# Patient Record
Sex: Female | Born: 1955
Health system: Southern US, Community
[De-identification: ages and names within clinical notes are randomized; demographics above are authoritative.]

## PROBLEM LIST (undated history)

## (undated) DIAGNOSIS — E282 Polycystic ovarian syndrome: Secondary | ICD-10-CM

## (undated) DIAGNOSIS — R51 Headache: Secondary | ICD-10-CM

## (undated) DIAGNOSIS — M1990 Unspecified osteoarthritis, unspecified site: Secondary | ICD-10-CM

## (undated) DIAGNOSIS — E785 Hyperlipidemia, unspecified: Secondary | ICD-10-CM

## (undated) DIAGNOSIS — M069 Rheumatoid arthritis, unspecified: Secondary | ICD-10-CM

## (undated) DIAGNOSIS — R519 Headache, unspecified: Secondary | ICD-10-CM

## (undated) DIAGNOSIS — M797 Fibromyalgia: Secondary | ICD-10-CM

## (undated) DIAGNOSIS — G4733 Obstructive sleep apnea (adult) (pediatric): Secondary | ICD-10-CM

## (undated) DIAGNOSIS — K219 Gastro-esophageal reflux disease without esophagitis: Secondary | ICD-10-CM

## (undated) DIAGNOSIS — J449 Chronic obstructive pulmonary disease, unspecified: Secondary | ICD-10-CM

## (undated) DIAGNOSIS — E119 Type 2 diabetes mellitus without complications: Secondary | ICD-10-CM

## (undated) DIAGNOSIS — I7300 Raynaud's syndrome without gangrene: Secondary | ICD-10-CM

## (undated) DIAGNOSIS — R42 Dizziness and giddiness: Secondary | ICD-10-CM

## (undated) HISTORY — PX: COLONOSCOPY W/ BIOPSIES: SHX1374

## (undated) HISTORY — PX: TOE SURGERY: SHX1073

## (undated) HISTORY — DX: Gastro-esophageal reflux disease without esophagitis: K21.9

## (undated) HISTORY — DX: Headache, unspecified: R51.9

## (undated) HISTORY — DX: Hyperlipidemia, unspecified: E78.5

## (undated) HISTORY — DX: Type 2 diabetes mellitus without complications: E11.9

## (undated) HISTORY — DX: Fibromyalgia: M79.7

## (undated) HISTORY — DX: Headache: R51

## (undated) HISTORY — DX: Polycystic ovarian syndrome: E28.2

## (undated) HISTORY — PX: VESICOVAGINAL FISTULA CLOSURE W/ TAH: SUR271

## (undated) HISTORY — DX: Chronic obstructive pulmonary disease, unspecified: J44.9

## (undated) HISTORY — DX: Dizziness and giddiness: R42

## (undated) HISTORY — DX: Obstructive sleep apnea (adult) (pediatric): G47.33

## (undated) HISTORY — PX: APPENDECTOMY: SHX54

## (undated) HISTORY — PX: CHOLECYSTECTOMY: SHX55

## (undated) HISTORY — DX: Unspecified osteoarthritis, unspecified site: M19.90

## (undated) HISTORY — DX: Rheumatoid arthritis, unspecified: M06.9

---

## 1994-02-05 HISTORY — PX: KNEE SURGERY: SHX244

## 1997-06-23 ENCOUNTER — Encounter: Admission: RE | Admit: 1997-06-23 | Discharge: 1997-09-21 | Payer: Self-pay | Admitting: Orthopedic Surgery

## 1997-08-10 ENCOUNTER — Ambulatory Visit (HOSPITAL_COMMUNITY): Admission: RE | Admit: 1997-08-10 | Discharge: 1997-08-10 | Payer: Self-pay | Admitting: Orthopedic Surgery

## 1997-09-27 ENCOUNTER — Ambulatory Visit (HOSPITAL_BASED_OUTPATIENT_CLINIC_OR_DEPARTMENT_OTHER): Admission: RE | Admit: 1997-09-27 | Discharge: 1997-09-27 | Payer: Self-pay | Admitting: Orthopedic Surgery

## 1997-12-13 ENCOUNTER — Encounter: Admission: RE | Admit: 1997-12-13 | Discharge: 1998-01-18 | Payer: Self-pay | Admitting: Orthopedic Surgery

## 1998-02-01 ENCOUNTER — Ambulatory Visit (HOSPITAL_COMMUNITY): Admission: RE | Admit: 1998-02-01 | Discharge: 1998-02-01 | Payer: Self-pay | Admitting: *Deleted

## 1998-04-05 ENCOUNTER — Ambulatory Visit (HOSPITAL_BASED_OUTPATIENT_CLINIC_OR_DEPARTMENT_OTHER): Admission: RE | Admit: 1998-04-05 | Discharge: 1998-04-05 | Payer: Self-pay | Admitting: Orthopedic Surgery

## 2000-10-18 ENCOUNTER — Other Ambulatory Visit: Admission: RE | Admit: 2000-10-18 | Discharge: 2000-10-18 | Payer: Self-pay | Admitting: Obstetrics and Gynecology

## 2001-02-06 ENCOUNTER — Ambulatory Visit (HOSPITAL_COMMUNITY): Admission: RE | Admit: 2001-02-06 | Discharge: 2001-02-06 | Payer: Self-pay | Admitting: Family Medicine

## 2001-02-06 ENCOUNTER — Encounter: Payer: Self-pay | Admitting: Family Medicine

## 2001-03-05 ENCOUNTER — Ambulatory Visit (HOSPITAL_COMMUNITY): Admission: RE | Admit: 2001-03-05 | Discharge: 2001-03-05 | Payer: Self-pay | Admitting: Internal Medicine

## 2001-03-05 ENCOUNTER — Encounter: Payer: Self-pay | Admitting: Internal Medicine

## 2001-03-24 ENCOUNTER — Ambulatory Visit (HOSPITAL_COMMUNITY): Admission: RE | Admit: 2001-03-24 | Discharge: 2001-03-24 | Payer: Self-pay | Admitting: Internal Medicine

## 2001-11-24 ENCOUNTER — Other Ambulatory Visit: Admission: RE | Admit: 2001-11-24 | Discharge: 2001-11-24 | Payer: Self-pay | Admitting: Obstetrics and Gynecology

## 2002-10-07 HISTORY — PX: COMBINED HYSTERECTOMY VAGINAL / OOPHORECTOMY / A&P REPAIR: SUR294

## 2002-10-19 ENCOUNTER — Inpatient Hospital Stay (HOSPITAL_COMMUNITY): Admission: RE | Admit: 2002-10-19 | Discharge: 2002-10-21 | Payer: Self-pay | Admitting: Obstetrics and Gynecology

## 2002-10-19 ENCOUNTER — Encounter (INDEPENDENT_AMBULATORY_CARE_PROVIDER_SITE_OTHER): Payer: Self-pay

## 2004-05-31 ENCOUNTER — Other Ambulatory Visit: Admission: RE | Admit: 2004-05-31 | Discharge: 2004-05-31 | Payer: Self-pay | Admitting: Obstetrics and Gynecology

## 2006-01-18 ENCOUNTER — Encounter (INDEPENDENT_AMBULATORY_CARE_PROVIDER_SITE_OTHER): Payer: Self-pay | Admitting: Specialist

## 2006-01-18 ENCOUNTER — Ambulatory Visit (HOSPITAL_COMMUNITY): Admission: RE | Admit: 2006-01-18 | Discharge: 2006-01-18 | Payer: Self-pay | Admitting: Surgery

## 2006-10-01 ENCOUNTER — Encounter: Admission: RE | Admit: 2006-10-01 | Discharge: 2006-10-01 | Payer: Self-pay | Admitting: Obstetrics & Gynecology

## 2007-09-12 ENCOUNTER — Ambulatory Visit (HOSPITAL_COMMUNITY): Admission: RE | Admit: 2007-09-12 | Discharge: 2007-09-12 | Payer: Self-pay | Admitting: Family Medicine

## 2008-10-17 ENCOUNTER — Emergency Department (HOSPITAL_COMMUNITY): Admission: EM | Admit: 2008-10-17 | Discharge: 2008-10-17 | Payer: Self-pay | Admitting: Emergency Medicine

## 2009-01-15 ENCOUNTER — Ambulatory Visit: Admission: RE | Admit: 2009-01-15 | Discharge: 2009-01-15 | Payer: Self-pay | Admitting: Cardiovascular Disease

## 2010-03-07 ENCOUNTER — Encounter
Admission: RE | Admit: 2010-03-07 | Discharge: 2010-03-07 | Payer: Self-pay | Source: Home / Self Care | Attending: Otolaryngology | Admitting: Otolaryngology

## 2010-03-09 ENCOUNTER — Emergency Department (HOSPITAL_COMMUNITY)
Admission: EM | Admit: 2010-03-09 | Discharge: 2010-03-09 | Disposition: A | Discharge status: Home / Self Care | Payer: BC Managed Care – PPO | Attending: Emergency Medicine | Admitting: Emergency Medicine

## 2010-03-09 DIAGNOSIS — F329 Major depressive disorder, single episode, unspecified: Secondary | ICD-10-CM | POA: Insufficient documentation

## 2010-03-09 DIAGNOSIS — F3289 Other specified depressive episodes: Secondary | ICD-10-CM | POA: Insufficient documentation

## 2010-03-09 DIAGNOSIS — J4489 Other specified chronic obstructive pulmonary disease: Secondary | ICD-10-CM | POA: Insufficient documentation

## 2010-03-09 DIAGNOSIS — J449 Chronic obstructive pulmonary disease, unspecified: Secondary | ICD-10-CM | POA: Insufficient documentation

## 2010-03-09 DIAGNOSIS — M25559 Pain in unspecified hip: Secondary | ICD-10-CM | POA: Insufficient documentation

## 2010-03-09 DIAGNOSIS — Z79899 Other long term (current) drug therapy: Secondary | ICD-10-CM | POA: Insufficient documentation

## 2010-03-09 DIAGNOSIS — K219 Gastro-esophageal reflux disease without esophagitis: Secondary | ICD-10-CM | POA: Insufficient documentation

## 2010-03-10 ENCOUNTER — Ambulatory Visit (HOSPITAL_COMMUNITY)
Admission: RE | Admit: 2010-03-10 | Discharge: 2010-03-10 | Disposition: A | Discharge status: Home / Self Care | Payer: BC Managed Care – PPO | Source: Ambulatory Visit | Attending: Family Medicine | Admitting: Family Medicine

## 2010-03-10 ENCOUNTER — Encounter (HOSPITAL_COMMUNITY): Payer: Self-pay

## 2010-03-10 ENCOUNTER — Other Ambulatory Visit (HOSPITAL_COMMUNITY): Payer: Self-pay | Admitting: Family Medicine

## 2010-03-10 DIAGNOSIS — S0990XA Unspecified injury of head, initial encounter: Secondary | ICD-10-CM | POA: Insufficient documentation

## 2010-03-10 DIAGNOSIS — R51 Headache: Secondary | ICD-10-CM

## 2010-03-10 DIAGNOSIS — X58XXXA Exposure to other specified factors, initial encounter: Secondary | ICD-10-CM | POA: Insufficient documentation

## 2010-03-10 DIAGNOSIS — R42 Dizziness and giddiness: Secondary | ICD-10-CM | POA: Insufficient documentation

## 2010-06-23 NOTE — Op Note (Signed)
[facility]  Patient:    Stephanie Norris, Stephanie Norris Visit Number: [id number] MRN: [id number]          Service Type: END Location: DAY Attending Physician:  Jonathon Bellows Dictated by:   Roetta Sessions, M.D. Proc. Date: 03/24/01 Admit Date:  03/24/2001   CC:         Stephanie Norris, M.D.   Operative Report  PROCEDURE:  Colonoscopy with snare polypectomy followed by diagnostic esophagogastroduodenoscopy.  INDICATIONS FOR PROCEDURE:  The patient is a 55 year old lady with hemoccult-positive stool and intermittent epigastric right upper quadrant abdominal pain.  She does have typical reflux symptoms that were responsive to a brief course of Nexium.  Recent CT scan demonstrated hyperdense liver with fatty infiltration.  No other abnormalities aside from diverticulosis without evidence of diverticulitis, status post appendectomy and lobulated uterus. She has positive family history of colorectal neoplasia.  Colonoscopy and upper endoscopy now being done to further evaluate her symptoms.  This approach has been discussed with Stephanie Norris at some length.  The potential risks, benefits and alternatives have been reviewed and all questions answered.  She is agreeable.  Please see my dictated consultation note for more information.  DESCRIPTION OF PROCEDURE:  Oxygen saturation, blood pressure, pulse and respirations were monitored throughout both procedures.  Conscious sedation for both procedures:  Versed 5 mg IV, Demerol 25 mg IV in divided doses. Instrument:  Olympus videochip gastroscope and colonoscope.  FINDINGS:  Digital rectal examination revealed no abnormalities.  ENDOSCOPIC FINDINGS:  Colonoscopy prep was good.  RECTUM:  Examination of the rectal mucosa including retroflexed view  of the anal verge revealed no abnormalities.  COLON:  In the distal sigmoid at 25 cm was an angry 0.75 cm polyp on a stalk. Please see photos.  There were also scattered  sigmoid diverticula.  The remainder of the colonic mucosa all the way to the cecum appeared normal.  The appendiceal orifice, ileocecal valve and cecum were well seen and photographed.  From the level of the cecum and ileocecal valve, the scope was slowly withdrawn and all previously mentioned mucosal surfaces were again seen.  No other abnormalities were observed.  The polyp at 25 cm was resected with snare cautery and recovered.  The patient tolerated the colonoscopy well and was prepared for upper endoscopy.  UPPER ENDOSCOPY:  Cetacaine spray was used for topical oropharyngeal anesthesia.  FINDINGS:  Examination of the tubular esophagus revealed multiple distal esophageal erosions.  There was no evidence of ring, stricture or Barretts esophagus.  The EG junction was easily traversed.  STOMACH:  Gastric cavity was empty and insufflated well with air.  A thorough examination of the gastric mucosa including retroflexed view of the proximal stomach and esophagogastric junction demonstrated only a small hiatal hernia. Pylorus was patent and easily traversed.  DUODENUM:  The first and second portion of the duodenum appeared normal.  THERAPEUTIC/DIAGNOSTIC MANEUVERS PERFORMED:  None.  The patient tolerated both procedures and was reacted to endoscopy.  COLONOSCOPY FINDINGS 1. Normal rectum. 2. Angry polyp on a stalk at 25 cm resected with snare cautery. 3. Scattered left-sided diverticula. 4. The remainder of the colonic mucosa appeared normal.  UPPER ENDOSCOPY FINDINGS: 1. Distal esophageal erosions.  The remainder of the esophagus appeared    normal. 2. Small hiatal hernia.  The remainder of the stomach and duodenum through    the second portion appeared normal.  RECOMMENDATIONS: 1. We will treat Stephanie Norris aggressively for gastroesophageal reflux disease.  We will start her on Aciphex 20 mg early daily 30 minutes before    breakfast. 2. Antireflux measures/_____ . 3. No  aspirin or arthritis medication for the next 10 days. 4. Follow up on pathology for polyp removed today. 5. Diverticulosis literature has been given to Stephanie Norris.  She should    increase her fiber intake.  She should either take Metamucil,    Citrucel or Benofiber daily. 6. I suspect the polyp in the sigmoid could have at least produced at least a    hemoccult-positive stool. 7. If her upper abdominal symptoms are not markedly improved with a course of    Aciphex, would pursue gallbladder further via an ultrasound. 8. Appointment to see [place] back in the office in three to four weeks. Dictated by:   Roetta Sessions, M.D. Attending Physician:  Jonathon Bellows DD:  03/24/01 TD:  03/24/01 Job: [id number]

## 2010-06-23 NOTE — Discharge Summary (Signed)
NAME:  Stephanie Norris, Stephanie Norris                         ACCOUNT NO.:  [id number]   MEDICAL RECORD NO.:  [id number]                   PATIENT TYPE:  INP   LOCATION:  9118                                 FACILITY:  [facility]   PHYSICIAN:  Guy Sandifer. Arleta Creek, M.D.           DATE OF BIRTH:  1955-02-21   DATE OF ADMISSION:  10/19/2002  DATE OF DISCHARGE:  10/21/2002                                 DISCHARGE SUMMARY   ADMISSION DIAGNOSIS:  Pelvic prolapse.   DISCHARGE DIAGNOSES:  1. Pelvic prolapse.  2. Uterine leiomyomata.  3. Endometriosis.   PROCEDURES:  On October 19, 2002, laparoscopically assisted vaginal  hysterectomy with bilateral salpingo-oophorectomy, anterior vaginal repair  with Pelvicol graft, posterior vaginal repair with PelviSoft graft and  ablation of endometriosis.   REASON FOR ADMISSION:  The patient is a 55 year old, married, white female,  G1, P0, who was postmenopausal with increasing symptoms of pelvic prolapse.  Details have been dictated in the history and physical.  She was admitted  for surgical management.   HOSPITAL COURSE:  The patient undergoes the above procedure.  The estimated  blood loss was 125 mL.  On the evening of surgery, she has good pain relief  with no nausea or vomiting and no flatus.  She remains afebrile with stable  vital signs and clear urine output.  On the morning of the first  postoperative day, she complains of some headache and some dizziness when  she is trying to sit up.  She has no nausea or vomiting, no flatus, no chest  pain, no shortness of breath, no cough, and no leg pain and she is  tolerating liquids.  Blood pressures have been 78-90/48-60.  Her pulse  remains regular.  Urine output is somewhat concentrated.  She has a 98%  oxygen saturation on 2 L nasal cannula oxygen.  Examination reveals  bibasilar crackles of the lungs with a regular rate and rhythm of the heart.  The abdomen is soft with good bowel sounds in all four  quadrants.  She was  given an IV fluid bolus.  The white count is 13.5 and the hemoglobin is  10.7.  A repeat CBC reveals a stable hemoglobin of 10.6.  The EKG is normal  sinus rhythm.  She is given additional fluid bolus of 250 mL.  A  consultation with anesthesia is also carried out.  It is felt to be  consistent with a probable prolonged reaction to the Dilaudid that she  received in the PACU, as well as her Dilaudid PCA.  The Dilaudid PCA is  discontinued.  She begins to feel better and by that evening is feeling much  better and is ambulating well.  She is passing flatus, tolerating a regular  diet, and voiding well at that time.  The Foley catheter had been removed  earlier in the day when she began to feel better.  Blood pressures were  90s/60s,  again with her regular pulse.  On the day of discharge, she  continues to feel well without problems.  She is discharged home in good  condition.  Pathology is pending.   DISCHARGE MEDICATIONS:  1. Percocet 5/325 mg, #30, one to two p.o. q.6h. p.r.n.  2. Multivitamins daily.  3. Colace daily.  4. She resume the Nexium that she was taking preoperatively.    DISCHARGE INSTRUCTIONS:  The patient is to call for any problems, including,  but not limited to heavy vaginal bleeding, persistent nausea and vomiting,  and increasing pain or temperature of 101 degrees.  No vaginal entry.  No  operation of automobiles.  No lifting as instructed.  Followup is in the  office in two weeks.                                               Guy Sandifer Arleta Creek, M.D.    JET/MEDQ  D:  10/21/2002  T:  10/21/2002  Job:  [id number]

## 2010-06-23 NOTE — Op Note (Signed)
NAME:  RHYLIN, VENTERS               ACCOUNT NO.:  [id number]   MEDICAL RECORD NO.:  [id number]          PATIENT TYPE:  AMB   LOCATION:  DAY                          FACILITY:  [facility]   PHYSICIAN:  Sandria Bales. Ezzard Standing, M.D.  DATE OF BIRTH:  1955/04/05   DATE OF PROCEDURE:  01/18/2006  DATE OF DISCHARGE:                               OPERATIVE REPORT   PREOPERATIVE DIAGNOSIS:  Biliary dyskinesia.   POSTOPERATIVE DIAGNOSIS:  Biliary dyskinesia.   OPERATION/PROCEDURE:  Laparoscopic cholecystectomy with intraoperative  cholangiogram.   SURGEON:  Sandria Bales. Ezzard Standing, M.D.   FIRST ASSISTANT:  Leonie Man, M.D.   ANESTHESIA:  General endotracheal anesthesia.   ESTIMATED BLOOD LOSS:  Minimal.   INDICATIONS:  Mrs. Bastyr is a 55 year old white female who is a patient  of Zollie Pee, PA-C at [facility] in [facility] who has had  symptoms of epigastric and right upper quadrant pain for some two years,  particularly when eating certain foods.  She had a negative ultrasound  and hepatic liver scan which showed a 19% ejection fraction consistent  with biliary dyskinesia.   The indications and potential complications of the surgery were  explained to the patient to include complications but are not limited to  bleeding, infection, bile duct injury, and the possibility that surgery  may not resolve her abdominal symptoms.   DESCRIPTION OF PROCEDURE:  The patient was placed in the supine  position, given a general endotracheal anesthetic.  She had PAS  stockings in place.  She is allergic to Ancef.  I did not giver her any  preoperative antibiotics.  Her abdomen was prepped with Betadine  solution and sterilely draped.   She had four abdominal trocars placed. An 11/12 mm Hasson trocar at the  umbilicus secured with a 0 Vicryl suture.  A 10 mm subxiphoid trocar, a  5 mm right mid subcostal trocar and a 5 mm lateral subcostal trocar.  Abdominal exploration revealed the right and left  lobes of the liver  showing some fatty changes but no mass or lesion.  The bowel was covered  very much with omentum. There was no other nodule or mass.   The gallbladder was identified, rotated cephalad. There were some  adhesions on the anterior wall of the gallbladder which were taken down  sharply and bluntly.  Dissection was carried out at the  gallbladder/cystic duct junction and a clip placed across an anterior  cystic artery.  This was then divided.  The clip then placed on the  gallbladder side of the cystic duct and intraoperative cholangiogram was  obtained.   The intraoperative cholangiogram was obtained using a cut-off Taut  catheter inserted through a 14-gauge Jelco catheter and secured with an  endo-clip.  I used about 8 mL of half-strength Hypaque solution and  injected this under fluoroscopy.  The cholangiogram showed free flow of  contrast down the cystic duct, down the common bile duct, into the  duodenum, and up the hepatic radicals.  This was felt to be a normal  intraoperative cholangiogram. The Taut catheter was then removed.  The cystic duct was triply endoclipped and divided.  There was a  posterior branch of the cystic artery which had been clipped and  divided.  The gallbladder was then sharply and bluntly dissected from  the gallbladder bed.  Prior to complete division of the gallbladder bed,  I revisualized the triangle of Calot.  I revisualized the gallbladder  bed.  There was no bile leak, no bleeding from either of these.  The  gallbladder was then divided, placed in the EndoCatch bag and delivered  through the umbilicus.   I then inspected the liver and gallbladder bed and removed the trocar in  turn.  The umbilical port was closed with 0 Vicryl suture.  The skin at  each port was closed with a 5-0 Vicryl suture and  painted with tincture  of Benzoin and steri-stripped.   The patient tolerated the procedure well and was transferred to the  recovery  room in good condition.  Sponge and needle counts were correct.      Sandria Bales. Ezzard Standing, M.D.  Electronically Signed     DHN/MEDQ  D:  01/18/2006  T:  01/18/2006  Job:  [id number]   cc:   Zollie Pee, PA-C  [facility]  [address]  [place] [address]

## 2010-06-23 NOTE — H&P (Signed)
NAME:  Stephanie Norris, Stephanie Norris                           ACCOUNT NO.:  [id number]   MEDICAL RECORD NO.:  [id number]                   PATIENT TYPE:   LOCATION:                                       FACILITY:   PHYSICIAN:  Guy Sandifer. Arleta Creek, M.D.           DATE OF BIRTH:   DATE OF ADMISSION:  10/19/2002  DATE OF DISCHARGE:                                HISTORY & PHYSICAL   CHIEF COMPLAINT:  Symptomatic pelvic prolapse.   HISTORY OF PRESENT ILLNESS:  This patient is a 55 year old, married, white  female, G1, P0, who is postmenopausal with a last menstrual period in June  of 2003.  She has increasingly symptomatic pelvic prolapse with difficulty  sometimes emptying her rectum, having to digitally splint for bowel  movements.  She also has Valsalva-induced detrusor instability.  Urodynamic  testing on September 01, 2002, with Dr. Edward Jolly documents detrusor instability and  a lack of stress urinary incontinence.  She does have some loss of urine  with coughing and sneezing only when her bladder is full.  Placement of an  incontinence ring gave little relief to any of these symptoms.  After a  careful discussion of the options of management, she is being admitted for a  laparoscopically assisted vaginal hysterectomy to assure bilateral salpingo-  oophorectomy with anterior posterior repair with Pelvicol grafts.  Possible  sacrospinous ligament suspension has also been reviewed.   PAST MEDICAL HISTORY:  1. Asthma-like symptoms in the past.  2. History of rectal fissures and hemorrhoids.  3. History of polycystic ovarian disease.  4. History of anxiety disorder.  5. Detrusor instability.  6. History of hirsutism.   PAST SURGICAL HISTORY:  1. Appendectomy at age 63.  2. Left knee surgery in 1992.  3. D&C in 1983.  4. Laparoscopy at age 47.   OBSTETRICAL HISTORY:  Miscarriage with D&C in 1983.   FAMILY HISTORY:  Diabetes in paternal grandmother and maternal grandparents.  Chronic hypertension  in paternal grandmother.  Tuberculosis in mother and  sister.  Asthma in mother.  Thyroid abnormality in mother.   MEDICATIONS:  1. Nexium daily.  2. Paxil daily.  3. Multivitamins and stool softeners daily.   ALLERGIES:  1. IBUPROFEN leading to her throat closing off (aspirin is okay if coated).  2. KEFLEX leading to rash.  Other antibiotics have been okay in the past.   SOCIAL HISTORY:  The patient denies tobacco, alcohol, or drug abuse.   REVIEW OF SYSTEMS:  NEUROLOGIC:  Denies headache.  PULMONARY:  History of  asthma-like symptoms in the past.  No shortness of breath or cough recently.  CARDIOVASCULAR:  Denies chest pain.  GASTROINTESTINAL:  Esophageal reflux  disease.   PHYSICAL EXAMINATION:  HEIGHT:  5 feet 0 inches.  WEIGHT:  143 pounds.  VITAL SIGNS:  Blood pressure 116/74.  HEENT:  Without thyromegaly.  LUNGS:  Clear to auscultation.  HEART:  Regular rate  and rhythm.  BACK:  Without CVA tenderness.  BREASTS:  Without mass, retraction, or discharge.  ABDOMEN:  Soft and nontender without masses.  PELVIC:  Vulva, vagina, and cervix without lesion.  The uterus is normal  size and mobile with first degree descent.  There is a cystocele, first and  second degree that remains within the hymenal ring.  Rectocele, grade 2,  descends down to the hymenal ring.  Adnexa nontender without masses.  EXTREMITIES:  Grossly within normal limits.  NEUROLOGIC:  Grossly within normal limits.   ASSESSMENT:  Symptomatic pelvic relaxation.   PLAN:  1. Laparoscopically assisted vaginal hysterectomy with bilateral salpingo-     oophorectomy.  2. Anterior posterior vaginal repair.  3. Possible sacrospinous ligament suspension.                                               Guy Sandifer Arleta Creek, M.D.    JET/MEDQ  D:  10/14/2002  T:  10/14/2002  Job:  [id number]

## 2010-06-23 NOTE — Op Note (Signed)
NAME:  Stephanie Norris, Stephanie Norris                         ACCOUNT NO.:  [id number]   MEDICAL RECORD NO.:  [id number]                   PATIENT TYPE:  INP   LOCATION:  9199                                 FACILITY:  [facility]   PHYSICIAN:  Guy Sandifer. Arleta Creek, M.D.           DATE OF BIRTH:  [birth year]   DATE OF PROCEDURE:  10/19/2002  DATE OF DISCHARGE:                                 OPERATIVE REPORT   PREOPERATIVE DIAGNOSIS:  Pelvic prolapse.   POSTOPERATIVE DIAGNOSES:  1. Pelvic prolapse.  2. Uterine leiomyomata.  3. Endometriosis.   PROCEDURES:  1. Laparoscopically-assisted vaginal hysterectomy with bilateral salpingo-     oophorectomy.  2. Anterior repair with Pelvicol graft.  3. Posterior repair with PelviSoft graft.  4. Ablation of endometriosis.   SURGEON:  Guy Sandifer. Henderson Cloud, M.D.   ASSISTANT:  Raynald Kemp, M.D.   ANESTHESIA:  General with endotracheal intubation.   ESTIMATED BLOOD LOSS:  125 mL.   SPECIMENS:  Uterus, fallopian tubes, and ovaries bilaterally.   INDICATIONS AND CONSENT:  This patient is a 55 year old married white  female, G1, P0, who is postmenopausal with last menstrual period in June  2003.  She has increasing symptomatic pelvic prolapse.  Details are dictated  in the history and physical.  Laparoscopically-assisted vaginal hysterectomy  with bilateral salpingo-oophorectomy, anterior and posterior repair with  Pelvicol and PelviSoft grafts is discussed preoperatively.  Potential risks  and complications have been discussed, including but not limited to  infection, bowel, bladder, or ureteral damage, bleeding requiring  transfusion of blood products with possible transfusion reaction, HIV and  hepatitis acquisition, DVT and PE, pneumonia, fistula formation,  postoperative dyspareunia, laparotomy, and postoperative pelvic pain.  all  questions have been answered and consent is signed and on the chart.   FINDINGS:  Upper abdomen is grossly normal.  Uterus is  approximately six  weeks in size and distorted with 1-1.5 cm subserosal uterine leiomyomata.  There are two to three powder burn-type lesions of endometriosis immediately  lateral to the left uterosacral ligament and a single lesion on the right  pelvic sidewall.  Tubes and ovaries are normal.   DESCRIPTION OF PROCEDURE:  The patient was taken to the operating room and  placed in the dorsal supine position, where general anesthesia is induced  via endotracheal intubation.  She is then placed in the dorsal lithotomy  position, where she is prepped abdominally.  Bladder is straight-  catheterized with a red rubber catheter.  A Hulka tenaculum was placed on  the uterus as a manipulator, and she was draped in a sterile fashion.  A  small infraumbilical incision is made and a Veress needle is placed without  difficulty.  Syringe and drop test are normal.  Pneumoperitoneum is created  with approximately 2 L of CO2.  The Veress needle is then withdrawn and the  10/11 disposable trocar sleeve is placed without difficulty.  Placement is  verified with the laparoscope and no damage to surrounding structures is  noted.  A small suprapubic incision is made in the midline and a 5 mm  disposable trocar sleeve is placed under direct visualization without  difficulty.  The above findings are noted.  The course of the ureters is  noted bilaterally.  Bipolar cautery is used to ablate the areas of  endometriosis in a superficial fashion.  Then using the Gyrus bipolar  cautery cutting instrument, the infundibulopelvic ligaments, followed by the  mesosalpinx, followed by the round ligaments down to the level of the  vesicouterine peritoneum, is taken down bilaterally.  Good hemostasis is  maintained.  The vesicouterine peritoneum is incised in the midline,  hydrodissected, and taken down cephalolaterally.  The suprapubic trocar  sleeve is removed and attention is turned to the vagina.  A weighted  speculum is  placed.  The posterior cul-de-sac is entered sharply.  Cervix is  circumscribed with a scalpel.  The vaginal mucosa is advanced sharply and  bluntly.  The uterosacral ligaments are taken down bilaterally and are  ligated with transfixion sutures of 0 Monocryl.  All suture will be 0  Monocryl unless otherwise designated.  The bladder pillars, followed by the  cardinal ligaments, followed by the uterine vessels, are taken bilaterally.  At least one bite above the level of the uterine vessels is taken  bilaterally.  The fundus with tubes and ovaries is delivered posteriorly,  ligaments are clamped and taken down, and the specimen is delivered.  Pedicles are ligated with free ties.  The uterosacral ligaments are then  plicated to the vagina bilaterally and are then plicated in the midline with  a separate suture.  The posterior half of the cuff is closed with figure-of-  eights.  The anterior vaginal mucosa is then infiltrated with 0.5% plain  lidocaine with epinephrine submucosally bilaterally.  The anterior vaginal  mucosa is then dissected from the underlying bladder beginning in the  midline to a point approximately 1-2 cm below the level of the urethral  meatus.  This was then carried out bilaterally sharply and bluntly.  This is  carried down to the point that the ischial spines are palpated bilaterally.  Then using the Capio needle passer, a 0 Vicryl suture is placed through the  sacrospinous ligament one to two fingerbreadths medial to the ischial spines  bilaterally.  These are then anchored to the back corners of a Pelvicol  graft, which has been trimmed to fit.  These sutures are then used to anchor  and advance the Pelvicol graft.  After trimming to fit, 0 Vicryl sutures are  used in the pubocervical fascia bilaterally on the distal end to anchor it  on the two remaining corners.  This is done well clear of midline structures.  A small amount of vaginal mucosa is trimmed, and the  mucosa is  closed with running, locking 2-0 Monocryl sutures.  Figure-of-eight 0  Monocryls are used to close the remaining portion of the anterior vaginal  cuff.  Then after removing a small diamond-shaped portion of tissue from the  posterior perineal body, the posterior vaginal mucosa is infiltrated  bilaterally with 0.5% lidocaine with epinephrine.  The posterior vaginal  mucosa is dissected from the underlying bladder in the midline to a point at  the top of the rectocele.  This was carried out bilaterally sharply and  bluntly.  Obvious defects in the rectovaginal fascia are noted and site-  specific repairs are  carried out using 0 Monocryl suture.  This does a good  job of repairing the defects.  Then 0 Vicryl sutures are placed bilaterally  approximately 1 cm inferior and slightly distal to the ischial spines  bilaterally.  After trimming a piece of PelviSoft to fit, these are used to  advance the PelviSoft and lay it in place.  The PelviSoft is then anchored  with two 0 Vicryl sutures bilaterally along the sides to the levator plates  bilaterally.  After trimming slightly to fit, additional 0 Vicryl sutures  are used to attach to the perineal body.  The perineal body is  reapproximated as well with a single 0 Monocryl suture.  After trimming a  small amount of posterior vaginal mucosa, it is closed with a running  locking layer of 2-0 Monocryl and carried on down in the standard episiotomy-  type fashion.  A Foley catheter is placed in the bladder and clear urine is  noted.  Two-inch plain pack with estrogen cream is placed in the vagina.  Attention is then turned to the abdomen.  After recreating pneumoperitoneum,  a small number of bleeders are controlled on the peritoneal edges with  bipolar cautery and repeated inspection under reduced pneumoperitoneum  reveals excellent hemostasis.  Excess fluid is removed, suprapubic trocar  sleeve is removed, and good hemostasis is noted all  around.  Pneumoperitoneum is completely reduced and the umbilical incision is closed  with a 0 Vicryl suture in the deeper underlying layers with care being taken  not to pick up any underlying structures.  The skin incisions are then  injected with 0.5% plain Marcaine.  The umbilical incision is closed with  mattress sutures of 3-0 Vicryl secondary to  bleeding at the skin edges.  Dermabond is then used to close the skin on  both incisions and a pressure dressing is placed on the umbilicus.  All  counts are correct.  The patient is awakened and taken to the recovery room  in stable condition.                                               Guy Sandifer Arleta Creek, M.D.    JET/MEDQ  D:  10/19/2002  T:  10/19/2002  Job:  [id number]

## 2011-02-22 ENCOUNTER — Ambulatory Visit (HOSPITAL_COMMUNITY)
Admission: RE | Admit: 2011-02-22 | Discharge: 2011-02-22 | Disposition: A | Discharge status: Home / Self Care | Payer: BC Managed Care – PPO | Source: Ambulatory Visit | Attending: Internal Medicine | Admitting: Internal Medicine

## 2011-02-22 ENCOUNTER — Other Ambulatory Visit (HOSPITAL_COMMUNITY): Payer: Self-pay | Admitting: *Deleted

## 2011-02-22 DIAGNOSIS — R05 Cough: Secondary | ICD-10-CM

## 2011-02-22 DIAGNOSIS — R059 Cough, unspecified: Secondary | ICD-10-CM | POA: Insufficient documentation

## 2011-02-22 DIAGNOSIS — R0602 Shortness of breath: Secondary | ICD-10-CM | POA: Insufficient documentation

## 2011-02-28 ENCOUNTER — Other Ambulatory Visit (HOSPITAL_COMMUNITY): Payer: Self-pay | Admitting: Internal Medicine

## 2011-02-28 DIAGNOSIS — K5792 Diverticulitis of intestine, part unspecified, without perforation or abscess without bleeding: Secondary | ICD-10-CM

## 2011-03-02 ENCOUNTER — Ambulatory Visit (HOSPITAL_COMMUNITY)
Admission: RE | Admit: 2011-03-02 | Discharge: 2011-03-02 | Disposition: A | Discharge status: Home / Self Care | Payer: BC Managed Care – PPO | Source: Ambulatory Visit | Attending: Internal Medicine | Admitting: Internal Medicine

## 2011-03-02 DIAGNOSIS — R1031 Right lower quadrant pain: Secondary | ICD-10-CM | POA: Insufficient documentation

## 2011-03-02 DIAGNOSIS — K5792 Diverticulitis of intestine, part unspecified, without perforation or abscess without bleeding: Secondary | ICD-10-CM

## 2011-03-02 DIAGNOSIS — K7689 Other specified diseases of liver: Secondary | ICD-10-CM | POA: Insufficient documentation

## 2011-03-28 ENCOUNTER — Ambulatory Visit (INDEPENDENT_AMBULATORY_CARE_PROVIDER_SITE_OTHER): Payer: BC Managed Care – PPO | Admitting: Internal Medicine

## 2011-03-28 ENCOUNTER — Encounter: Payer: Self-pay | Admitting: Internal Medicine

## 2011-03-28 VITALS — BP 122/78 | HR 88 | Temp 98.5°F | Ht 59.75 in | Wt 154.6 lb

## 2011-03-28 DIAGNOSIS — R0602 Shortness of breath: Secondary | ICD-10-CM

## 2011-03-28 DIAGNOSIS — R05 Cough: Secondary | ICD-10-CM

## 2011-03-28 MED ORDER — MOMETASONE FURO-FORMOTEROL FUM 200-5 MCG/ACT IN AERO: INHALATION_SPRAY | RESPIRATORY_TRACT | Status: DC

## 2011-03-28 NOTE — Patient Instructions (Addendum)
dulera 200 Take 2 puffs first thing in am and then another 2 puffs about 12 hours later.    Work on inhaler technique:  relax and gently blow all the way out then take a nice smooth deep breath back in, triggering the inhaler at same time you start breathing in.  Hold for up to 5 seconds if you can.  Rinse and gargle with water when done   If your mouth or throat starts to bother you,   I suggest you time the inhaler to your dental care and after using the inhaler(s) brush teeth and tongue with a baking soda containing toothpaste and when you rinse this out, gargle with it first to see if this helps your mouth and throat.     GERD (REFLUX)  is an extremely common cause of respiratory symptoms, many times with no significant heartburn at all.    It can be treated with medication, but also with lifestyle changes including avoidance of late meals, excessive alcohol, smoking cessation, and avoid fatty foods, chocolate, peppermint, colas, red wine, and acidic juices such as orange juice.  NO MINT OR MENTHOL PRODUCTS SO NO COUGH DROPS  USE SUGARLESS CANDY INSTEAD (jolley ranchers or Stover's)  NO OIL BASED VITAMINS - use powdered substitutes.   If not improving start zegrid in am also  Please schedule a follow up office visit in 6 weeks, call sooner if needed for pft's

## 2011-03-28 NOTE — Progress Notes (Signed)
  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   03/28/2011 1st pulmonary eval cc chest tightness present daily x one year waxes and wanes,better in shower better p inhaler dulera but then worse off it. ? Sleep "I fight my cpap". Cough mostly dry and more day than night  Doe variable but sometime just room to room  Denies any obvious fluctuation of symptoms with weather or environmental changes or other aggravating or alleviating factors except as outlined above   Review of Systems  Constitutional: Negative for fever, chills and unexpected weight change.  HENT: Positive for congestion and trouble swallowing. Negative for ear pain, nosebleeds, sore throat, rhinorrhea, sneezing, dental problem, voice change, postnasal drip and sinus pressure.   Eyes: Negative for visual disturbance.  Respiratory: Positive for cough and shortness of breath. Negative for choking.   Cardiovascular: Positive for chest pain. Negative for leg swelling.  Gastrointestinal: Negative for vomiting, abdominal pain and diarrhea.  Genitourinary: Negative for difficulty urinating.  Musculoskeletal: Positive for arthralgias.  Skin: Negative for rash.  Neurological: Positive for headaches. Negative for tremors and syncope.  Hematological: Does not bruise/bleed easily.       Objective:   Physical Exam  Pleasant amb min hoarse wf nad  Wt 154 03/28/11  HEENT: nl dentition, turbinates, and orophanx. Nl  external ear canals without cough reflex   NECK :  without JVD/Nodes/TM/ nl carotid upstrokes bilaterally   LUNGS: no acc muscle use, clear to A and P bilaterally without cough on insp or exp maneuvers   CV:  RRR  no s3 or murmur or increase in P2, no edema   ABD:  soft and nontender with nl excursion in the supine position. No bruits or organomegaly, bowel sounds nl  MS:  warm without deformities, calf tenderness, cyanosis or clubbing  SKIN: warm and dry without lesions    NEURO:  alert, approp, no deficits   02/22/11 cxr No acute abnormalities.      Assessment & Plan:

## 2011-03-30 DIAGNOSIS — J4540 Moderate persistent asthma, uncomplicated: Secondary | ICD-10-CM | POA: Insufficient documentation

## 2011-03-30 NOTE — Assessment & Plan Note (Signed)
Symptoms are markedly disproportionate to objective findings and not clear this is a lung problem but pt does appear to have difficult airway management issues. DDX of  difficult airways managment all start with A and  include Adherence, Ace Inhibitors, Acid Reflux, Active Sinus Disease, Alpha 1 Antitripsin deficiency, Anxiety masquerading as Airways dz,  ABPA,  allergy(esp in young), Aspiration (esp in elderly), Adverse effects of DPI,  Active smokers, plus two Bs  = Bronchiectasis and Beta blocker use..and one C= CHF   Adherence is always the initial "prime suspect" and is a multilayered concern that requires a "trust but verify" approach in every patient - starting with knowing how to use medications, especially inhalers, correctly, keeping up with refills and understanding the fundamental difference between maintenance and prns vs those medications only taken for a very short course and then stopped and not refilled. The proper method of use, as well as anticipated side effects, of this metered-dose inhaler are discussed and demonstrated to the patient. Improved from 25-75% with coaching     ? Acid reflux > next step is diet and maybe increase zegrid to bid   See instructions for specific recommendations which were reviewed directly with the patient who was given a copy with highlighter outlining the key components.

## 2011-05-08 ENCOUNTER — Telehealth: Payer: Self-pay | Admitting: Internal Medicine

## 2011-05-08 NOTE — Telephone Encounter (Signed)
Fine with me - needs pft's as rec regardless of what doctor does the follow up or she could see [facility]

## 2011-05-08 NOTE — Telephone Encounter (Signed)
Dr. Shelle Iron, pls advise if you are ok with this.  Thank you.

## 2011-05-08 NOTE — Telephone Encounter (Signed)
Dr. Sherene Sires, please advise if you are okay with pt changing providers, thanks

## 2011-05-08 NOTE — Telephone Encounter (Signed)
Pt returned call. She says she prefers either dr. Shelle Iron or dr. Maple Hudson. Stephanie Norris

## 2011-05-08 NOTE — Telephone Encounter (Signed)
lmomtcb x1--does she have a preference who she see's

## 2011-05-08 NOTE — Telephone Encounter (Signed)
Returning call can be reached at [phone].Stephanie Norris

## 2011-05-08 NOTE — Telephone Encounter (Signed)
Called # provided above - LM on pt's named VM tcb to inform her of protocol and does she have a preference on another dr?

## 2011-05-09 NOTE — Telephone Encounter (Signed)
I spoke with pt and she is going to keep her apt for PFT on 05/14/11 and is scheduled to see [facility] 05/30/11 at 1:30. Pt is aware to arrive 15 min prior to fill out paperwork. Noting further was needed

## 2011-05-09 NOTE — Telephone Encounter (Signed)
Agree to see, but will need to be in consult slot, and she still needs to have pfts before seeing me.

## 2011-05-14 ENCOUNTER — Ambulatory Visit (INDEPENDENT_AMBULATORY_CARE_PROVIDER_SITE_OTHER): Payer: BC Managed Care – PPO | Admitting: Internal Medicine

## 2011-05-14 ENCOUNTER — Ambulatory Visit: Payer: BC Managed Care – PPO | Admitting: Internal Medicine

## 2011-05-14 DIAGNOSIS — R0602 Shortness of breath: Secondary | ICD-10-CM

## 2011-05-14 LAB — PULMONARY FUNCTION TEST

## 2011-05-14 NOTE — Progress Notes (Signed)
PFT done today. 

## 2011-05-16 ENCOUNTER — Encounter: Payer: Self-pay | Admitting: Internal Medicine

## 2011-05-30 ENCOUNTER — Encounter: Payer: Self-pay | Admitting: Pulmonary Disease

## 2011-05-30 ENCOUNTER — Ambulatory Visit (INDEPENDENT_AMBULATORY_CARE_PROVIDER_SITE_OTHER): Payer: BC Managed Care – PPO | Admitting: Pulmonary Disease

## 2011-05-30 ENCOUNTER — Other Ambulatory Visit: Payer: BC Managed Care – PPO

## 2011-05-30 DIAGNOSIS — R0609 Other forms of dyspnea: Secondary | ICD-10-CM

## 2011-05-30 DIAGNOSIS — J45909 Unspecified asthma, uncomplicated: Secondary | ICD-10-CM

## 2011-05-30 NOTE — Assessment & Plan Note (Signed)
The patient has moderate airflow obstruction by her recent pulmonary function studies, and therefore either has fixed asthma or poorly controlled airway inflammation leading to airflow obstruction.  I have explained to her that Singulair alone is really inadequate treatment for moderate to severe asthmatics, and that she really needs to be on inhaled corticosteroids.  I would like to put her back on dulera, and hopefully over time her air flow obstruction will improve.  I also think she needs to have an alpha-1 antitrypsin level checked as well.

## 2011-05-30 NOTE — Progress Notes (Signed)
  Subjective:    Patient ID: Stephanie Norris, female    DOB: 1955-04-06, 56 y.o.   MRN: [id number]  HPI The patient is a 56 year old female who I've been asked to see for management of asthma and also dyspnea on exertion.  The patient was diagnosed with asthma over 10 years ago, and has been on Singulair for treatment.  She feels that her asthma has been well-controlled, and has not required a prednisone taper for quite a long time.  However, she has significant dyspnea on exertion, and recent pulmonary function studies that showed moderate airflow obstruction and air trapping.  The patient describes a one block dyspnea on exertion at a moderate pace on flat ground, and we'll also get winded bringing groceries in from the car or making a bed.  She has not had a recent cardiac workup, and her weight has been stable over the last few years.  The patient has an intermittent cough which she believes is secondary to postnasal drip, which is an ongoing issue for her.  She has a history of recurrent sinus infections, and has seen ENT in the past.  She has not had a recent scan of her sinuses.  She also has a history of allergies on testing, and took allergy shots for a period of time that did help some.  A lot of her symptoms are consistent with chronic rhinitis.  The patient also has a history of reflux disease which she feels is well controlled on Zegerid.   Review of Systems  Constitutional: Negative for fever and unexpected weight change.  HENT: Positive for ear pain and congestion. Negative for nosebleeds, sore throat, rhinorrhea, sneezing, trouble swallowing, dental problem, postnasal drip and sinus pressure.   Eyes: Negative for redness and itching.  Respiratory: Positive for shortness of breath. Negative for cough, chest tightness and wheezing.   Cardiovascular: Negative for palpitations and leg swelling.  Gastrointestinal: Negative for nausea and vomiting.  Genitourinary: Negative for dysuria.    Musculoskeletal: Negative for joint swelling.  Skin: Negative for rash.  Neurological: Negative for headaches.  Hematological: Does not bruise/bleed easily.  Psychiatric/Behavioral: Negative for dysphoric mood. The patient is not nervous/anxious.        Objective:   Physical Exam Constitutional:  Overweight female, no acute distress  HENT:  Nares patent without discharge  Oropharynx without exudate, palate and uvula are elongated.  Eyes:  Perrla, eomi, no scleral icterus  Neck:  No JVD, no TMG  Cardiovascular:  Normal rate, regular rhythm, no rubs or gallops.  No murmurs        Intact distal pulses  Pulmonary :  Normal breath sounds, no stridor or respiratory distress   No rales, rhonchi, or wheezing  Abdominal:  Soft, nondistended, bowel sounds present.  No tenderness noted.   Musculoskeletal:  Mild ankle edema noted.  Lymph Nodes:  No cervical lymphadenopathy noted  Skin:  No cyanosis noted  Neurologic:  Alert, appropriate, moves all 4 extremities without obvious deficit.         Assessment & Plan:

## 2011-05-30 NOTE — Assessment & Plan Note (Signed)
The patient's dyspnea on exertion is out of proportion to her degree of airflow obstruction.  It is unclear whether this is a conditioning and obesity issue, or whether she may have a concomitant cardiac issues as well.  I have stressed to her the importance of consistent and aggressive asthma treatment, and also she needs to work on weight loss and conditioning.  If she does not see an improvement in her exertional tolerance, I would then recommend a cardiac workup.

## 2011-05-30 NOTE — Patient Instructions (Addendum)
Stay on singulair for allergy control Will restart dulera 100/5  2 inhalations am and pm everyday no matter what.  Rinse mouth well. Try chlorpheniramine 8mg  each night for allergies and postnasal drip.  Can take another dose at lunch if needed. Think about trying neilmed sinus rinses to help with allergies and congestion.  Use am and pm when needed.  Will arrange followup visit to discuss your sleep apnea and cpap.

## 2011-06-05 LAB — ALPHA-1 ANTITRYPSIN PHENOTYPE: A-1 Antitrypsin: 121 mg/dL (ref 83–199)

## 2011-06-22 ENCOUNTER — Ambulatory Visit (INDEPENDENT_AMBULATORY_CARE_PROVIDER_SITE_OTHER): Payer: BC Managed Care – PPO | Admitting: Pulmonary Disease

## 2011-06-22 ENCOUNTER — Encounter: Payer: Self-pay | Admitting: Pulmonary Disease

## 2011-06-22 VITALS — BP 118/86 | HR 86 | Temp 98.5°F | Ht 59.75 in | Wt 150.6 lb

## 2011-06-22 DIAGNOSIS — G4710 Hypersomnia, unspecified: Secondary | ICD-10-CM

## 2011-06-22 DIAGNOSIS — G4733 Obstructive sleep apnea (adult) (pediatric): Secondary | ICD-10-CM | POA: Insufficient documentation

## 2011-06-22 NOTE — Assessment & Plan Note (Signed)
It is unclear at this point if the patient has obstructive sleep apnea or not.  She had a NPSG in 2010 that was unremarkable except for increased numbers of leg kicks.  She has been on CPAP even before then from a portable study done in the distant past.  She currently has nonrestorative sleep and significant daytime sleepiness even wearing CPAP, but is even worse if she does not wear the device.  I think we need to put the issue to rest at this point, and we'll need to schedule her for a sleep study.  The patient states that she cannot afford an in lab study, and therefore will schedule her for sleep testing.  She understands this will not evaluate for a movement disorder of sleep, and may under estimate her degree of actual sleep apnea.

## 2011-06-22 NOTE — Progress Notes (Signed)
Subjective:    Patient ID: Stephanie Norris, female    DOB: [birth year], 56 y.o.   MRN: [id number]  HPI The patient is a 57 year old female who is here for evaluation of possible obstructive sleep apnea.  The patient states that she was diagnosed with obstructive sleep apnea by portable monitoring approximately 8 years ago, and has been on CPAP since that time.  She did have a formal sleep study in 2010 and did not show any clinically significant sleep apnea.  Despite this, she stayed on CPAP because she could not sleep well without it.  Her machine is currently set on 11 cm of water pressure, and she uses a full face mask that is apparently new.  Despite this, the patient complains of nonrestorative sleep, and has definite sleep pressure during the day with inactivity.  She will also have occasional sleep pressure with driving.  The patient states her bed partner has not complained about leg kicks, but she does have an abnormal sensation in her legs at night that is improved with movement.  However, this primarily occurs in her foot rather than her whole leg, and she has chronic pain related to an old foot injury.  The patient states that her weight is up a few pounds over the last few years.  Sleep Questionnaire: What time do you typically go to bed?( Between what hours) 10:30 - 11 pm How long does it take you to fall asleep? 10 minutes How many times during the night do you wake up? 2 What time do you get out of bed to start your day? 0620 Do you drive or operate heavy machinery in your occupation? No How much has your weight changed (up or down) over the past two years? (In pounds) 2 lb (0.907 kg) Have you ever had a sleep study before? Yes If yes, location of study? [facility] If yes, date of study? Do you currently use CPAP? Yes If so, what pressure? 11cm ? Do you wear oxygen at any time? No    Review of Systems  Constitutional: Negative.  Negative for fever and unexpected weight change.  HENT:  Positive for ear pain, congestion, sneezing, trouble swallowing and dental problem. Negative for nosebleeds, sore throat, rhinorrhea, postnasal drip and sinus pressure.   Eyes: Negative.  Negative for redness and itching.  Respiratory: Positive for cough and shortness of breath. Negative for chest tightness and wheezing.   Cardiovascular: Positive for leg swelling. Negative for palpitations.  Gastrointestinal: Negative.  Negative for nausea and vomiting.  Genitourinary: Negative.  Negative for dysuria.  Musculoskeletal: Positive for arthralgias. Negative for joint swelling.  Skin: Negative.  Negative for rash.  Neurological: Negative.  Negative for headaches.  Hematological: Negative.  Does not bruise/bleed easily.  Psychiatric/Behavioral: Negative.  Negative for dysphoric mood. The patient is not nervous/anxious.        Objective:   Physical Exam Constitutional:  Overweight female, no acute distress  HENT:  Nares patent without discharge  Oropharynx without exudate, palate and uvula are mildly elongated  Eyes:  Perrla, eomi, no scleral icterus  Neck:  No JVD, no TMG  Cardiovascular:  Normal rate, regular rhythm, no rubs or gallops.  No murmurs        Intact distal pulses  Pulmonary :  Normal breath sounds, no stridor or respiratory distress   No rales, rhonchi, or wheezing  Abdominal:  Soft, nondistended, bowel sounds present.  No tenderness noted.   Musculoskeletal:  No lower extremity edema noted.  Lymph Nodes:  No cervical lymphadenopathy noted  Skin:  No cyanosis noted  Neurologic:  Appears mildly sleepy, appropriate, moves all 4 extremities without obvious deficit.         Assessment & Plan:

## 2011-06-22 NOTE — Patient Instructions (Signed)
Will schedule for home sleep testing, and will call you to discuss once results are available.  Stay on current asthma treatment for now. Work on weight reduction

## 2011-07-04 ENCOUNTER — Telehealth: Payer: Self-pay | Admitting: Pulmonary Disease

## 2011-07-04 NOTE — Telephone Encounter (Signed)
Noted  

## 2011-07-09 ENCOUNTER — Telehealth: Payer: Self-pay | Admitting: Pulmonary Disease

## 2011-07-09 NOTE — Telephone Encounter (Signed)
Spoke to Stephanie Norris and she states she was not on the methotrexate when she came to see kc, she had stopped this herself, but the med was prescribed by dr Titus Dubin so i told Stephanie Norris she would need to discuss this with the dr that originally gave this to her Stephanie Norris verbalized understanding and will call dr Titus Dubin

## 2011-07-31 ENCOUNTER — Telehealth: Payer: Self-pay | Admitting: Pulmonary Disease

## 2011-07-31 NOTE — Telephone Encounter (Signed)
LMTCB x 1 

## 2011-08-01 NOTE — Telephone Encounter (Signed)
LMTCB x2  

## 2011-08-01 NOTE — Telephone Encounter (Signed)
Spoke with pt. She states that she is unable to afford sleep study, but needs an order sent to [facility] for new cpap since her machine is so old. Please advise recs thanks!

## 2011-08-03 NOTE — Telephone Encounter (Signed)
Patient calling back about order for cpap machine.

## 2011-08-03 NOTE — Telephone Encounter (Signed)
She cannot get a new cpap machine without documentation she has sleep apnea.  The only way to do this is to do the sleep study.  If this does not show sleep apnea, then she would not be able to get the cpap machine either.

## 2011-08-06 NOTE — Telephone Encounter (Signed)
lmomtcb x1 for pt 

## 2011-08-06 NOTE — Telephone Encounter (Signed)
Pt advised. Pt states the the sleep study is going to cost her over $1000 and she cannot afford that at this time. . I asked about home sleep study and she states insurance will not cover this at all. Pt states she will call back if this changes. Carron Curie, CMA

## 2011-08-09 ENCOUNTER — Ambulatory Visit (HOSPITAL_BASED_OUTPATIENT_CLINIC_OR_DEPARTMENT_OTHER)
Admission: RE | Admit: 2011-08-09 | Payer: BC Managed Care – PPO | Source: Ambulatory Visit | Admitting: Internal Medicine

## 2011-08-09 ENCOUNTER — Ambulatory Visit (HOSPITAL_BASED_OUTPATIENT_CLINIC_OR_DEPARTMENT_OTHER)
Admission: RE | Admit: 2011-08-09 | Discharge: 2011-08-09 | Disposition: A | Discharge status: Home / Self Care | Payer: BC Managed Care – PPO | Source: Ambulatory Visit | Attending: Internal Medicine | Admitting: Internal Medicine

## 2011-08-09 ENCOUNTER — Other Ambulatory Visit (HOSPITAL_BASED_OUTPATIENT_CLINIC_OR_DEPARTMENT_OTHER): Payer: Self-pay | Admitting: Internal Medicine

## 2011-08-09 DIAGNOSIS — R109 Unspecified abdominal pain: Secondary | ICD-10-CM

## 2011-08-09 DIAGNOSIS — Z8719 Personal history of other diseases of the digestive system: Secondary | ICD-10-CM | POA: Insufficient documentation

## 2011-08-09 DIAGNOSIS — Z9089 Acquired absence of other organs: Secondary | ICD-10-CM | POA: Insufficient documentation

## 2011-08-09 DIAGNOSIS — R319 Hematuria, unspecified: Secondary | ICD-10-CM | POA: Insufficient documentation

## 2011-09-03 ENCOUNTER — Telehealth: Payer: Self-pay | Admitting: Pulmonary Disease

## 2011-09-03 MED ORDER — MOMETASONE FURO-FORMOTEROL FUM 100-5 MCG/ACT IN AERO: 2.0000 | INHALATION_SPRAY | Freq: Two times a day (BID) | RESPIRATORY_TRACT | Status: DC

## 2011-09-03 NOTE — Telephone Encounter (Signed)
lmomtcb x1 for pt 

## 2011-09-03 NOTE — Telephone Encounter (Signed)
Spoke with pt to verify the msg. Rx for dulera was sent to pharm. Nothing further needed.

## 2011-11-27 ENCOUNTER — Other Ambulatory Visit (HOSPITAL_COMMUNITY): Payer: Self-pay | Admitting: Rheumatology

## 2011-11-27 DIAGNOSIS — M545 Low back pain: Secondary | ICD-10-CM

## 2011-11-27 DIAGNOSIS — W19XXXA Unspecified fall, initial encounter: Secondary | ICD-10-CM

## 2011-11-29 ENCOUNTER — Ambulatory Visit (HOSPITAL_COMMUNITY): Admission: RE | Admit: 2011-11-29 | Payer: Self-pay | Source: Ambulatory Visit

## 2012-03-17 ENCOUNTER — Telehealth: Payer: Self-pay | Admitting: Pulmonary Disease

## 2012-03-17 NOTE — Telephone Encounter (Signed)
symbicort 160/4.5 as alternative.  2 puffs am and pm everyday.  Rinse mouth well.

## 2012-03-17 NOTE — Telephone Encounter (Signed)
Spoke with pt and she states that the dulera is too expensive It is covered by ins, but is a higher tier and she is requesting alternative Pharmacist would not give any alternatives She has never tried symbicort before [facility], please advise thanks~!

## 2012-03-18 MED ORDER — BUDESONIDE-FORMOTEROL FUMARATE 160-4.5 MCG/ACT IN AERO: 2.0000 | INHALATION_SPRAY | Freq: Two times a day (BID) | RESPIRATORY_TRACT | Status: DC

## 2012-03-18 NOTE — Telephone Encounter (Signed)
I spoke with pt and is aware of KC recs. She voiced her understanding. rx has been sent. Nothing further was needed

## 2012-06-27 ENCOUNTER — Ambulatory Visit: Payer: Self-pay

## 2012-09-30 ENCOUNTER — Encounter: Payer: Self-pay | Admitting: Pulmonary Disease

## 2012-09-30 ENCOUNTER — Ambulatory Visit (INDEPENDENT_AMBULATORY_CARE_PROVIDER_SITE_OTHER): Payer: BC Managed Care – PPO | Admitting: Pulmonary Disease

## 2012-09-30 VITALS — BP 124/84 | HR 83 | Temp 97.9°F | Ht 59.75 in | Wt 150.0 lb

## 2012-09-30 DIAGNOSIS — G4710 Hypersomnia, unspecified: Secondary | ICD-10-CM

## 2012-09-30 DIAGNOSIS — J45909 Unspecified asthma, uncomplicated: Secondary | ICD-10-CM

## 2012-09-30 NOTE — Progress Notes (Signed)
  Subjective:    Patient ID: Stephanie Norris, female    DOB: 04/12/1955, 57 y.o.   MRN: [id number]  HPI The patient comes in today for followup of her known asthma, and also her sleep disruption with daytime hypersomnia.  She's been staying on her maintenance bronchodilator, and denies having a recent asthma flare.  She does note an increased inability to totally exhale, and feels that her throat is tightening and has a fullness.  She also clears her throat frequently.  She has a history of reflux disease, as well as chronic sinusitis.  The patient continues to have daytime hypersomnia, and is using CPAP for her snoring.  She had sleep apnea documented at one time, but her most recent study was unremarkable.  I tried to schedule a sleep study last year to put the issue to rest, but she was not able to afford it from a financial standpoint.  She is interested in doing a sleep study now.  She is having issues with pressure tolerance of her CPAP.   Review of Systems  Constitutional: Negative for fever and unexpected weight change.  HENT: Negative for ear pain, nosebleeds, congestion, sore throat, rhinorrhea, sneezing, trouble swallowing, dental problem, postnasal drip and sinus pressure.   Eyes: Negative for redness and itching.  Respiratory: Positive for shortness of breath. Negative for cough, chest tightness and wheezing.   Cardiovascular: Negative for palpitations and leg swelling.  Gastrointestinal: Negative for nausea and vomiting.  Genitourinary: Negative for dysuria.  Musculoskeletal: Negative for joint swelling.  Skin: Negative for rash.  Neurological: Negative for headaches.  Hematological: Does not bruise/bleed easily.  Psychiatric/Behavioral: Negative for dysphoric mood. The patient is not nervous/anxious.        Objective:   Physical Exam Overweight female in no acute distress Nose without purulence or discharge noted No skin breakdown or pressure necrosis from the CPAP mask Neck  without lymphadenopathy or thyromegaly Chest totally clear to auscultation, no wheezing Cardiac exam with regular rate and rhythm Lower extremities without edema, no cyanosis Alert and oriented, moves all 4 extremities.       Assessment & Plan:

## 2012-09-30 NOTE — Patient Instructions (Addendum)
Will setup for a split night study to determine if you have sleep apnea.  Will call you with results.  Continue with dulera, and keep mouth rinsed. Would consider whether postnasal drip or reflux is contributing to your throat symptoms.  This is unlikely to be your asthma. Work on weight loss and conditioning. Will arrange followup after your sleep test.

## 2012-09-30 NOTE — Assessment & Plan Note (Signed)
The patient feels that overall she is doing fairly well dulera, but is describing upper airway symptoms that are contributing to her feeling of difficulty with exhalation.  She has a fullness in her throat, frequent throat clearing, and a click in her throat area when she tries to exhale.  This is clearly an upper airway issue, and probably related to postnasal drip or reflux disease.  She has a history of reflux, and I've asked her to discuss this with her primary care physician.  She also has history of chronic sinusitis.  For now, I would like for her to continue on his layer, and also work on weight loss and conditioning.

## 2012-09-30 NOTE — Assessment & Plan Note (Signed)
The patient continues to have loud snoring and daytime hypersomnia when she does not wear her CPAP.  Her most recent sleep study did not show sleep apnea, but the patient is convinced that she has it.  I tried to order a sleep test last year, but she was unable to afford it.  She is willing to have the study now, and we'll schedule for a split night protocol.

## 2012-10-12 ENCOUNTER — Ambulatory Visit: Payer: BC Managed Care – PPO | Attending: Pulmonary Disease | Admitting: Sleep Medicine

## 2012-10-12 DIAGNOSIS — G4710 Hypersomnia, unspecified: Secondary | ICD-10-CM

## 2012-10-12 DIAGNOSIS — G4733 Obstructive sleep apnea (adult) (pediatric): Secondary | ICD-10-CM | POA: Insufficient documentation

## 2012-10-16 ENCOUNTER — Telehealth: Payer: Self-pay | Admitting: Pulmonary Disease

## 2012-10-16 DIAGNOSIS — G4730 Sleep apnea, unspecified: Secondary | ICD-10-CM

## 2012-10-16 DIAGNOSIS — G4710 Hypersomnia, unspecified: Secondary | ICD-10-CM

## 2012-10-16 NOTE — Telephone Encounter (Signed)
Copied from MyChart:    Appointment Request From: Stephanie Norris  With Provider: Barbaraann Share, MD  [facility]] Preferred Date Range: From 10/16/2012 To 10/27/2012 Preferred Times: Monday Morning, Tuesday Morning, Wednesday Morning, Thursday Morning, Friday Morning, Monday Afternoon, Tuesday Afternoon, Wednesday Afternoon, Thursday Afternoon, Friday Afternoon  Reason for visit: Annual Physical Comments: Follow up from Sleep Study on 9/8/'14  Stephanie Norris

## 2012-10-16 NOTE — Procedures (Signed)
NAME:  Stephanie Norris, Stephanie Norris NO.:  [id number]  MEDICAL RECORD NO.:  [id number]          PATIENT TYPE:  OUT  LOCATION:  SLEEP CENTER                 FACILITY:  [facility]  PHYSICIAN:  Barbaraann Share, MD,FCCPDATE OF BIRTH:  04/29/1955  DATE OF STUDY:  10/12/2012                           NOCTURNAL POLYSOMNOGRAM  REFERRING PHYSICIAN:  Barbaraann Share, MD,FCCP  LOCATION:  Sleep lab.  INDICATION:  Hypersomnia with sleep apnea.  EPWORTH SLEEPINESS SCORE:  9.  SLEEP ARCHITECTURE:  The patient had a total sleep time of 287 minutes with adequate slow-wave sleep for age and also decreased quantity of REM.  Sleep onset latency was prolonged at 67 minutes and REM onset was prolonged at 300 minutes.  Sleep efficiency was poor at 66%.  RESPIRATORY DATA:  The patient underwent a split night protocol, which she was found to have 74 obstructive events in the first 172 minutes of sleep.  This gave her an apnea-hypopnea index of 37 events per hour. The events occurred in all body positions and there was loud snoring noted throughout.  By protocol,she was then fitted with a petite Respironics Amara full-face mask, and CPAP titration was initiated.  She was found to have an optimal pressure of 9 cm of water, which treated her events nicely even through REM.  However, the patient did not have supine REM on this setting.  OXYGEN DATA:  There was O2 desaturation as low as 87% with the patient's obstructive events.  CARDIAC DATA:  No clinically significant arrhythmias were noted.  MOVEMENTS/PARASOMNIA:  The patient had moderate numbers of periodic limb movements with minimal sleep disruption.  There were no abnormal behaviors seen.  IMPRESSION/RECOMMENDATION:  Split night study reveals moderate-to-severe obstructive sleep apnea, with an AHI of 37 events per hour and oxygen desaturation as low as 87% during the diagnostic portion of the study. The patient was then fitted with a petite  Respironics Amara full-face mask, and found to have an optimal CPAP pressure of 9 cm of water.  She should also be encouraged to work on weight loss.     Barbaraann Share, MD,FCCP Diplomate, American Board of Sleep Medicine    [facility]/MEDQ  D:  10/16/2012 08:25:12  T:  10/16/2012 08:52:31  Job:  [id number]

## 2012-10-16 NOTE — Telephone Encounter (Signed)
We will call the pt once study read to set up followup  LMTCB

## 2012-10-17 NOTE — Telephone Encounter (Signed)
Spoke with pt and notified that we will call her for ov once the study is read She verbalized understanding and states nothing further needed

## 2012-10-27 ENCOUNTER — Telehealth: Payer: Self-pay | Admitting: Pulmonary Disease

## 2012-10-27 NOTE — Telephone Encounter (Signed)
Patient scheduled for Oct 1 @ 430 to review sleep study

## 2012-10-27 NOTE — Telephone Encounter (Signed)
Pt is requesting Sleep study results from 10/12/12. Please advise. Carron Curie, CMA

## 2012-10-27 NOTE — Telephone Encounter (Signed)
Has been done a long time ago. Will send to my nurse to set up OV.

## 2012-11-03 ENCOUNTER — Encounter (HOSPITAL_BASED_OUTPATIENT_CLINIC_OR_DEPARTMENT_OTHER): Payer: BC Managed Care – PPO

## 2012-11-05 ENCOUNTER — Encounter: Payer: Self-pay | Admitting: Pulmonary Disease

## 2012-11-05 ENCOUNTER — Ambulatory Visit (INDEPENDENT_AMBULATORY_CARE_PROVIDER_SITE_OTHER): Payer: BC Managed Care – PPO | Admitting: Pulmonary Disease

## 2012-11-05 VITALS — BP 112/76 | HR 90 | Temp 98.6°F | Ht 60.0 in | Wt 147.0 lb

## 2012-11-05 DIAGNOSIS — G4733 Obstructive sleep apnea (adult) (pediatric): Secondary | ICD-10-CM

## 2012-11-05 NOTE — Assessment & Plan Note (Signed)
The patient has been diagnosed with moderate obstructive sleep apnea, and would like to continue her treatment with CPAP.  Also the encouraged her to work aggressively on weight loss.  Her current machine is over 59 years old, and therefore will order her a new device.  She is to call me if she has any issues with pressure tolerance.

## 2012-11-05 NOTE — Patient Instructions (Addendum)
Will arrange for a new CPAP machine with a full facemask of choice.  Please call if you're having issues with pressure on exhalation and we can make adjustments.  I suspect that she will find the new device is easier to sleep with. Work on weight loss. Followup with me in 6 months if doing well with your CPAP device.

## 2012-11-05 NOTE — Progress Notes (Signed)
  Subjective:    Patient ID: Stephanie Norris, female    DOB: Jan 09, 1956, 57 y.o.   MRN: [id number]  HPI The patient comes in today for followup of her recent sleep study.  She was found to have moderate obstructive sleep apnea, with an AHI of 26 events per hour.  She was started on CPAP, and felt to have an optimal pressure of 9 cm of water.  I have reviewed the study with her in detail, and answered all of her questions.   Review of Systems  Constitutional: Negative for fever and unexpected weight change.  HENT: Negative for ear pain, nosebleeds, congestion, sore throat, rhinorrhea, sneezing, trouble swallowing, dental problem, postnasal drip and sinus pressure.   Eyes: Negative for redness and itching.  Respiratory: Negative for cough, chest tightness, shortness of breath and wheezing.   Cardiovascular: Negative for palpitations and leg swelling.  Gastrointestinal: Negative for nausea and vomiting.  Genitourinary: Negative for dysuria.  Musculoskeletal: Negative for joint swelling.  Skin: Negative for rash.  Neurological: Negative for headaches.  Hematological: Does not bruise/bleed easily.  Psychiatric/Behavioral: Negative for dysphoric mood. The patient is not nervous/anxious.        Objective:   Physical Exam Overweight female in no acute distress Nose without purulence or discharge noted Neck without lymphadenopathy or thyromegaly Lower extremities without edema, no cyanosis Alert and oriented, moves all 4 extremities.       Assessment & Plan:

## 2012-11-13 ENCOUNTER — Telehealth: Payer: Self-pay | Admitting: Pulmonary Disease

## 2012-11-13 NOTE — Telephone Encounter (Signed)
I spoke with the pt and she states she has a spastic bladder and her urologists has recommended having a botox injection in her bladder but the pt is concerned because all of the literature about this cautions anyone with breathing issues against this treatment. Pt wants to know what your thoughts are about this? Please advise. Carron Curie, CMA

## 2012-11-14 NOTE — Telephone Encounter (Signed)
It is considered very low risk to have breathing issues.  Probably ok.

## 2012-11-14 NOTE — Telephone Encounter (Signed)
Spoke with the pt and notified of recs per [facility] She verbalized understanding and states nothing further needed

## 2013-02-06 ENCOUNTER — Other Ambulatory Visit: Payer: Self-pay | Admitting: Obstetrics & Gynecology

## 2013-02-09 NOTE — Telephone Encounter (Signed)
Left Message To Call Back Re: Does patient needs refills?

## 2013-02-10 ENCOUNTER — Other Ambulatory Visit: Payer: Self-pay

## 2013-02-10 ENCOUNTER — Telehealth: Payer: Self-pay | Admitting: Obstetrics & Gynecology

## 2013-02-10 DIAGNOSIS — Z1231 Encounter for screening mammogram for malignant neoplasm of breast: Secondary | ICD-10-CM

## 2013-02-10 NOTE — Telephone Encounter (Signed)
See rx refill note

## 2013-02-10 NOTE — Telephone Encounter (Signed)
S/w patient she is needing refills.  Last Refilled: 03/2012 Last AEX: 10/2011 Last Mammogram: 2010  Told patient that she would need to have mammogram before we could refill her Estring.  Scheduled patient for AEX 02/17/13 with Ms.Patty  Patient said she will call back when she has mammogram scheduled please advise.  (Chart In Your Door)

## 2013-02-10 NOTE — Telephone Encounter (Signed)
Patient notified is aware that she needs mammogram before any further refills; patient says she will call and scheduled today.

## 2013-02-10 NOTE — Telephone Encounter (Signed)
NO refills until Mammo is done - not just scheduled.  No Mammo since 2008

## 2013-02-10 NOTE — Telephone Encounter (Signed)
Pt is returning a call to Jasmine. °

## 2013-02-17 ENCOUNTER — Ambulatory Visit: Payer: Self-pay | Admitting: Nurse Practitioner

## 2013-03-01 ENCOUNTER — Encounter (HOSPITAL_COMMUNITY): Payer: Self-pay | Admitting: Emergency Medicine

## 2013-03-01 DIAGNOSIS — E282 Polycystic ovarian syndrome: Secondary | ICD-10-CM | POA: Insufficient documentation

## 2013-03-01 DIAGNOSIS — J45909 Unspecified asthma, uncomplicated: Secondary | ICD-10-CM | POA: Insufficient documentation

## 2013-03-01 DIAGNOSIS — G4733 Obstructive sleep apnea (adult) (pediatric): Secondary | ICD-10-CM | POA: Insufficient documentation

## 2013-03-01 DIAGNOSIS — R509 Fever, unspecified: Secondary | ICD-10-CM | POA: Insufficient documentation

## 2013-03-01 DIAGNOSIS — R7309 Other abnormal glucose: Secondary | ICD-10-CM | POA: Insufficient documentation

## 2013-03-01 DIAGNOSIS — E872 Acidosis, unspecified: Secondary | ICD-10-CM | POA: Insufficient documentation

## 2013-03-01 DIAGNOSIS — R52 Pain, unspecified: Secondary | ICD-10-CM | POA: Insufficient documentation

## 2013-03-01 DIAGNOSIS — I498 Other specified cardiac arrhythmias: Secondary | ICD-10-CM | POA: Insufficient documentation

## 2013-03-01 DIAGNOSIS — R05 Cough: Secondary | ICD-10-CM | POA: Insufficient documentation

## 2013-03-01 DIAGNOSIS — Z881 Allergy status to other antibiotic agents status: Secondary | ICD-10-CM | POA: Insufficient documentation

## 2013-03-01 DIAGNOSIS — E871 Hypo-osmolality and hyponatremia: Secondary | ICD-10-CM | POA: Insufficient documentation

## 2013-03-01 DIAGNOSIS — E785 Hyperlipidemia, unspecified: Secondary | ICD-10-CM | POA: Insufficient documentation

## 2013-03-01 DIAGNOSIS — Z886 Allergy status to analgesic agent status: Secondary | ICD-10-CM | POA: Insufficient documentation

## 2013-03-01 DIAGNOSIS — J09X2 Influenza due to identified novel influenza A virus with other respiratory manifestations: Principal | ICD-10-CM | POA: Insufficient documentation

## 2013-03-01 DIAGNOSIS — R059 Cough, unspecified: Secondary | ICD-10-CM | POA: Insufficient documentation

## 2013-03-01 DIAGNOSIS — IMO0001 Reserved for inherently not codable concepts without codable children: Secondary | ICD-10-CM | POA: Insufficient documentation

## 2013-03-01 NOTE — ED Notes (Signed)
Fever, cough, generalized body aches since Friday, started augmentin yesterday, using albuterol inhaler for cough.

## 2013-03-02 ENCOUNTER — Encounter (HOSPITAL_COMMUNITY): Payer: Self-pay | Admitting: Emergency Medicine

## 2013-03-02 ENCOUNTER — Emergency Department (HOSPITAL_COMMUNITY): Payer: BC Managed Care – PPO

## 2013-03-02 ENCOUNTER — Observation Stay (HOSPITAL_COMMUNITY)
Admission: EM | Admit: 2013-03-02 | Discharge: 2013-03-03 | Disposition: A | Discharge status: Home / Self Care | Payer: BC Managed Care – PPO | Attending: Internal Medicine | Admitting: Internal Medicine

## 2013-03-02 DIAGNOSIS — I498 Other specified cardiac arrhythmias: Secondary | ICD-10-CM

## 2013-03-02 DIAGNOSIS — R739 Hyperglycemia, unspecified: Secondary | ICD-10-CM | POA: Diagnosis present

## 2013-03-02 DIAGNOSIS — J4540 Moderate persistent asthma, uncomplicated: Secondary | ICD-10-CM | POA: Diagnosis present

## 2013-03-02 DIAGNOSIS — R6889 Other general symptoms and signs: Secondary | ICD-10-CM

## 2013-03-02 DIAGNOSIS — M797 Fibromyalgia: Secondary | ICD-10-CM | POA: Diagnosis present

## 2013-03-02 DIAGNOSIS — J45909 Unspecified asthma, uncomplicated: Secondary | ICD-10-CM

## 2013-03-02 DIAGNOSIS — R0989 Other specified symptoms and signs involving the circulatory and respiratory systems: Secondary | ICD-10-CM

## 2013-03-02 DIAGNOSIS — R7989 Other specified abnormal findings of blood chemistry: Secondary | ICD-10-CM | POA: Diagnosis present

## 2013-03-02 DIAGNOSIS — E872 Acidosis, unspecified: Secondary | ICD-10-CM | POA: Diagnosis present

## 2013-03-02 DIAGNOSIS — IMO0001 Reserved for inherently not codable concepts without codable children: Secondary | ICD-10-CM

## 2013-03-02 DIAGNOSIS — J09X2 Influenza due to identified novel influenza A virus with other respiratory manifestations: Secondary | ICD-10-CM | POA: Diagnosis present

## 2013-03-02 DIAGNOSIS — R0609 Other forms of dyspnea: Secondary | ICD-10-CM

## 2013-03-02 DIAGNOSIS — G4733 Obstructive sleep apnea (adult) (pediatric): Secondary | ICD-10-CM

## 2013-03-02 DIAGNOSIS — R000 Tachycardia, unspecified: Secondary | ICD-10-CM | POA: Diagnosis present

## 2013-03-02 LAB — CBC WITH DIFFERENTIAL/PLATELET
Basophils Absolute: 0 10*3/uL (ref 0.0–0.1)
Basophils Relative: 0 % (ref 0–1)
Eosinophils Absolute: 0.2 10*3/uL (ref 0.0–0.7)
Eosinophils Relative: 2 % (ref 0–5)
HCT: 35.7 % — ABNORMAL LOW (ref 36.0–46.0)
Hemoglobin: 12.4 g/dL (ref 12.0–15.0)
LYMPHS ABS: 2.2 10*3/uL (ref 0.7–4.0)
LYMPHS PCT: 14 % (ref 12–46)
MCH: 31.8 pg (ref 26.0–34.0)
MCHC: 34.7 g/dL (ref 30.0–36.0)
MCV: 91.5 fL (ref 78.0–100.0)
MONOS PCT: 10 % (ref 3–12)
Monocytes Absolute: 1.6 10*3/uL — ABNORMAL HIGH (ref 0.1–1.0)
NEUTROS PCT: 74 % (ref 43–77)
Neutro Abs: 11.6 10*3/uL — ABNORMAL HIGH (ref 1.7–7.7)
PLATELETS: 277 10*3/uL (ref 150–400)
RBC: 3.9 MIL/uL (ref 3.87–5.11)
RDW: 13.8 % (ref 11.5–15.5)
WBC: 15.7 10*3/uL — AB (ref 4.0–10.5)

## 2013-03-02 LAB — BASIC METABOLIC PANEL
BUN: 21 mg/dL (ref 6–23)
CHLORIDE: 95 meq/L — AB (ref 96–112)
CO2: 22 meq/L (ref 19–32)
Calcium: 8.6 mg/dL (ref 8.4–10.5)
Creatinine, Ser: 0.76 mg/dL (ref 0.50–1.10)
GFR calc Af Amer: >90 mL/min (ref >90)
GFR calc non Af Amer: >90 mL/min (ref >90)
GLUCOSE: 261 mg/dL — AB (ref 70–99)
POTASSIUM: 3.7 meq/L (ref 3.7–5.3)
SODIUM: 133 meq/L — AB (ref 137–147)

## 2013-03-02 LAB — URINALYSIS, ROUTINE W REFLEX MICROSCOPIC
Bilirubin Urine: NEGATIVE
Ketones, ur: NEGATIVE mg/dL
Leukocytes, UA: NEGATIVE
NITRITE: NEGATIVE
PH: 5 (ref 5.0–8.0)
Protein, ur: NEGATIVE mg/dL
SPECIFIC GRAVITY, URINE: 1.02 (ref 1.005–1.030)
Urobilinogen, UA: 0.2 mg/dL (ref 0.0–1.0)

## 2013-03-02 LAB — GLUCOSE, CAPILLARY
GLUCOSE-CAPILLARY: 195 mg/dL — AB (ref 70–99)
Glucose-Capillary: 132 mg/dL — ABNORMAL HIGH (ref 70–99)

## 2013-03-02 LAB — LACTIC ACID, PLASMA
LACTIC ACID, VENOUS: 3.3 mmol/L — AB (ref 0.5–2.2)
Lactic Acid, Venous: 3.6 mmol/L — ABNORMAL HIGH (ref 0.5–2.2)
Lactic Acid, Venous: 2.8 mmol/L — ABNORMAL HIGH (ref 0.5–2.2)

## 2013-03-02 LAB — URINE MICROSCOPIC-ADD ON

## 2013-03-02 LAB — INFLUENZA PANEL BY PCR (TYPE A & B)
H1N1FLUPCR: NOT DETECTED
INFLAPCR: POSITIVE — AB
Influenza B By PCR: NEGATIVE

## 2013-03-02 MED ORDER — GUAIFENESIN-DM 100-10 MG/5ML PO SYRP
5.0000 mL | ORAL_SOLUTION | ORAL | Status: DC | PRN
  Administered 2013-03-02 – 2013-03-03 (×2): 5 mL via ORAL
  Filled 2013-03-02 (×2): qty 5

## 2013-03-02 MED ORDER — INSULIN ASPART 100 UNIT/ML ~~LOC~~ SOLN: 0.0000 [IU] | Freq: Every day | SUBCUTANEOUS | Status: DC

## 2013-03-02 MED ORDER — HYDROCOD POLST-CHLORPHEN POLST 10-8 MG/5ML PO LQCR
5.0000 mL | Freq: Three times a day (TID) | ORAL | Status: DC | PRN
Start: 2013-03-02 — End: 2013-03-02

## 2013-03-02 MED ORDER — VITAMIN D 1000 UNITS PO TABS
1000.0000 [IU] | ORAL_TABLET | Freq: Every day | ORAL | Status: DC
  Administered 2013-03-02 – 2013-03-03 (×2): 1000 [IU] via ORAL
  Filled 2013-03-02 (×5): qty 1

## 2013-03-02 MED ORDER — PANTOPRAZOLE SODIUM 40 MG PO TBEC
40.0000 mg | DELAYED_RELEASE_TABLET | Freq: Every day | ORAL | Status: DC
  Administered 2013-03-02 – 2013-03-03 (×2): 40 mg via ORAL
  Filled 2013-03-02 (×2): qty 1

## 2013-03-02 MED ORDER — DULOXETINE HCL 60 MG PO CPEP
60.0000 mg | ORAL_CAPSULE | Freq: Every day | ORAL | Status: DC
  Filled 2013-03-02: qty 1

## 2013-03-02 MED ORDER — SODIUM CHLORIDE 0.9 % IV SOLN: INTRAVENOUS | Status: DC

## 2013-03-02 MED ORDER — OSELTAMIVIR PHOSPHATE 75 MG PO CAPS
75.0000 mg | ORAL_CAPSULE | Freq: Two times a day (BID) | ORAL | Status: DC
  Administered 2013-03-02 – 2013-03-03 (×3): 75 mg via ORAL
  Filled 2013-03-02 (×2): qty 1

## 2013-03-02 MED ORDER — OSELTAMIVIR PHOSPHATE 75 MG PO CAPS
75.0000 mg | ORAL_CAPSULE | Freq: Once | ORAL | Status: AC
  Administered 2013-03-02: 75 mg via ORAL
  Filled 2013-03-02: qty 1

## 2013-03-02 MED ORDER — SODIUM CHLORIDE 0.9 % IV BOLUS (SEPSIS)
1000.0000 mL | Freq: Once | INTRAVENOUS | Status: AC
  Administered 2013-03-02: 1000 mL via INTRAVENOUS

## 2013-03-02 MED ORDER — DULOXETINE HCL 60 MG PO CPEP
60.0000 mg | ORAL_CAPSULE | Freq: Every day | ORAL | Status: DC
  Administered 2013-03-02: 60 mg via ORAL
  Filled 2013-03-02: qty 1

## 2013-03-02 MED ORDER — MOMETASONE FURO-FORMOTEROL FUM 100-5 MCG/ACT IN AERO
2.0000 | INHALATION_SPRAY | Freq: Two times a day (BID) | RESPIRATORY_TRACT | Status: DC
  Administered 2013-03-02 (×2): 2 via RESPIRATORY_TRACT
  Filled 2013-03-02: qty 8.8

## 2013-03-02 MED ORDER — ACETAMINOPHEN 325 MG PO TABS
650.0000 mg | ORAL_TABLET | ORAL | Status: DC | PRN
  Administered 2013-03-02 – 2013-03-03 (×2): 650 mg via ORAL
  Filled 2013-03-02 (×2): qty 2

## 2013-03-02 MED ORDER — POTASSIUM CHLORIDE IN NACL 20-0.9 MEQ/L-% IV SOLN
INTRAVENOUS | Status: AC
  Administered 2013-03-02: 75 mL/h via INTRAVENOUS

## 2013-03-02 MED ORDER — SODIUM CHLORIDE 0.9 % IV SOLN
1000.0000 mL | Freq: Once | INTRAVENOUS | Status: AC
  Administered 2013-03-02: 1000 mL via INTRAVENOUS

## 2013-03-02 MED ORDER — MIRABEGRON ER 25 MG PO TB24
50.0000 mg | ORAL_TABLET | Freq: Every day | ORAL | Status: DC
  Administered 2013-03-02 – 2013-03-03 (×2): 50 mg via ORAL
  Filled 2013-03-02 (×2): qty 2
  Filled 2013-03-02: qty 1

## 2013-03-02 MED ORDER — ACETAMINOPHEN 325 MG PO TABS
650.0000 mg | ORAL_TABLET | Freq: Once | ORAL | Status: AC
  Administered 2013-03-02: 650 mg via ORAL
  Filled 2013-03-02: qty 2

## 2013-03-02 MED ORDER — AMOXICILLIN-POT CLAVULANATE 250-125 MG PO TABS
1.0000 | ORAL_TABLET | Freq: Three times a day (TID) | ORAL | Status: DC
  Administered 2013-03-02 – 2013-03-03 (×4): 1 via ORAL
  Filled 2013-03-02 (×4): qty 1

## 2013-03-02 MED ORDER — DIPHENHYDRAMINE HCL 50 MG/ML IJ SOLN
25.0000 mg | Freq: Once | INTRAMUSCULAR | Status: AC
  Administered 2013-03-02: 25 mg via INTRAVENOUS
  Filled 2013-03-02: qty 1

## 2013-03-02 MED ORDER — MONTELUKAST SODIUM 10 MG PO TABS
10.0000 mg | ORAL_TABLET | Freq: Every day | ORAL | Status: DC
  Administered 2013-03-02: 10 mg via ORAL
  Filled 2013-03-02: qty 1

## 2013-03-02 MED ORDER — SODIUM CHLORIDE 0.9 % IV SOLN
1000.0000 mL | INTRAVENOUS | Status: DC
  Administered 2013-03-02: 1000 mL via INTRAVENOUS

## 2013-03-02 MED ORDER — LORATADINE 10 MG PO TABS
10.0000 mg | ORAL_TABLET | Freq: Every day | ORAL | Status: DC
  Administered 2013-03-02 – 2013-03-03 (×2): 10 mg via ORAL
  Filled 2013-03-02 (×2): qty 1

## 2013-03-02 MED ORDER — INSULIN ASPART 100 UNIT/ML ~~LOC~~ SOLN
0.0000 [IU] | Freq: Three times a day (TID) | SUBCUTANEOUS | Status: DC
  Administered 2013-03-02: 1 [IU] via SUBCUTANEOUS
  Administered 2013-03-03: 2 [IU] via SUBCUTANEOUS

## 2013-03-02 MED ORDER — METOCLOPRAMIDE HCL 5 MG/ML IJ SOLN
10.0000 mg | Freq: Once | INTRAMUSCULAR | Status: AC
  Administered 2013-03-02: 10 mg via INTRAVENOUS
  Filled 2013-03-02: qty 2

## 2013-03-02 NOTE — Progress Notes (Signed)
I have personally examined the patient and reviewed the entire database. Agree with the above note and plan as outlined, any necessary changes made.  Heylee Tant M.D. [facility] 03/02/2013, 11:18 AM Pager: [phone]  If 7PM-7AM, please contact night-coverage [email or web address] Password [email or web address]

## 2013-03-02 NOTE — H&P (Signed)
PCP:   Delphina Cahill, MD   Chief Complaint:  cough  HPI: 58 yo female with fibromyalgia, asthma comes in with 2 days of cough, nasal congestion.  She called her doc yesterday called in augmentin for her.  No steroids.  Fever one day.  Malaise.  No le edema or swelling.  She has bad allergies, and was given abx about 3 weeks ago also for acute bronchitis, augmentin.  No sick contacts.  No dysuria.  Review of Systems:  Positive and negative as per HPI otherwise all other systems are negative  Past Medical History: Past Medical History  Diagnosis Date  . Hyperlipidemia   . Asthma   . OSA (obstructive sleep apnea)   . Fibromyalgia   . PCOS (polycystic ovarian syndrome)    Past Surgical History  Procedure Laterality Date  . Cholecystectomy    . Colon surgery    . Vesicovaginal fistula closure w/ tah    . Appendectomy    . Knee surgery  1996    Medications: Prior to Admission medications   Medication Sig Start Date End Date Taking? Authorizing Provider  amoxicillin-clavulanate (AUGMENTIN) 250-125 MG per tablet Take 1 tablet by mouth 3 (three) times daily.   Yes Historical Provider, MD  cholecalciferol (VITAMIN D) 1000 UNITS tablet Take 1,000 Units by mouth daily.   Yes Historical Provider, MD  DULoxetine (CYMBALTA) 60 MG capsule Take 60 mg by mouth daily.   Yes Historical Provider, MD  estradiol (ESTRING) 2 MG vaginal ring Place 2 mg vaginally every 3 (three) months. follow package directions   Yes Historical Provider, MD  folic acid (FOLVITE) 244 MCG tablet Take 800 mcg by mouth daily.   Yes Historical Provider, MD  loratadine (CLARITIN) 10 MG tablet Take 10 mg by mouth daily.   Yes Historical Provider, MD  mirabegron ER (MYRBETRIQ) 50 MG TB24 tablet Take 50 mg by mouth daily.   Yes Historical Provider, MD  mometasone-formoterol (DULERA) 100-5 MCG/ACT AERO Inhale 2 puffs into the lungs 2 (two) times daily. 09/03/11  Yes Kathee Delton, MD  montelukast (SINGULAIR) 10 MG tablet Take  10 mg by mouth at bedtime.   Yes Historical Provider, MD  Omega-3 Fatty Acids (FISH OIL) 1000 MG CAPS Take 1 capsule by mouth daily.   Yes Historical Provider, MD  omeprazole-sodium bicarbonate (ZEGERID) 40-1100 MG per capsule Take 1 capsule by mouth at bedtime.   Yes Historical Provider, MD  fluticasone (FLONASE) 50 MCG/ACT nasal spray Place 1 spray into the nose 2 (two) times daily.    Historical Provider, MD  LORazepam (ATIVAN) 0.5 MG tablet Take 0.5 mg by mouth daily.    Historical Provider, MD    Allergies:   Allergies  Allergen Reactions  . Nsaids Anaphylaxis  . Cephalexin     rash  . Levofloxacin     rash  . Nitrofurantoin Monohyd Macro     rash    Social History:  reports that she has never smoked. She has never used smokeless tobacco. She reports that she does not drink alcohol or use illicit drugs.  Family History: Family History  Problem Relation Age of Onset  . Allergies      "Everyone"  . Asthma Mother   . Heart disease Maternal Grandfather   . Heart disease Maternal Grandmother   . Heart disease Paternal Grandfather   . Heart disease Paternal Grandmother   . Rheum arthritis Mother   . Rheum arthritis Sister     Physical Exam: Filed Vitals:  03/01/13 2351 03/02/13 0308  BP: 135/79 113/63  Pulse: 120 103  Temp: 101.1 F (38.4 C) 98.9 F (37.2 C)  TempSrc: Oral Oral  Resp: 16 22  SpO2: 98% 97%   General appearance: alert, cooperative and no distress Head: Normocephalic, without obvious abnormality, atraumatic Eyes: negative Nose: Nares normal. Septum midline. Mucosa normal. No drainage or sinus tenderness. Neck: no JVD and supple, symmetrical, trachea midline Lungs: clear to auscultation bilaterally Heart: regular rate and rhythm, S1, S2 normal, no murmur, click, rub or gallop Abdomen: soft, non-tender; bowel sounds normal; no masses,  no organomegaly Extremities: extremities normal, atraumatic, no cyanosis or edema Pulses: 2+ and symmetric Skin:  Skin color, texture, turgor normal. No rashes or lesions Neurologic: Grossly normal    Labs on Admission:   Recent Labs  03/02/13 0141  NA 133*  K 3.7  CL 95*  CO2 22  GLUCOSE 261*  BUN 21  CREATININE 0.76  CALCIUM 8.6    Recent Labs  03/02/13 0141  WBC 15.7*  NEUTROABS 11.6*  HGB 12.4  HCT 35.7*  MCV 91.5  PLT 277    Radiological Exams on Admission: Dg Chest 2 View  03/02/2013   CLINICAL DATA:  Fever, cough.  History of asthma.  EXAM: CHEST  2 VIEW  COMPARISON:  Chest radiograph April 23, 2012  FINDINGS: Cardiomediastinal silhouette is unremarkable for this low inspiratory examination with crowded vasculature markings. Trace bibasilar strandy densities. The lungs are otherwise clear without pleural effusions or focal consolidations. Trachea projects midline and there is no pneumothorax. Included soft tissue planes and osseous structures are non-suspicious. Surgical clips in the abdomen likely reflect cholecystectomy  IMPRESSION: Minimal bibasilar atelectasis in this low inspiratory examination.   Electronically Signed   By: Elon Alas   On: 03/02/2013 01:59    Assessment/Plan  58 yo female with uri symptoms?influenza  Principal Problem:   Flu-like symptoms- flu pcr pending.  Cover with tamiflu.  Cont abx started yesterday by dr hall.  cxr no obvious infiltrate, but poor inspiration.  Oxygen sats normal.    Active Problems:   Asthma  Stable no wheezing on exam cont home tx   Lactic acidosis  ivf and repeat lactic acid level later today   Sinus tachycardia  Resolved    Fibromyalgia  Noted and stable    Chaney Ingram A 03/02/2013, 6:05 AM

## 2013-03-02 NOTE — Progress Notes (Signed)
Inpatient Diabetes Program Recommendations  AACE/ADA: New Consensus Statement on Inpatient Glycemic Control (2013)  Target Ranges:  Prepandial:   less than 140 mg/dL      Peak postprandial:   less than 180 mg/dL (1-2 hours)      Critically ill patients:  140 - 180 mg/dL   Results for Stephanie Norris, Stephanie Norris (MRN [id number]) as of 03/02/2013 11:50  Ref. Range 03/02/2013 01:41  Glucose Latest Range: 70-99 mg/dL 261 (H)    Diabetes history: No history of diabetes but noted Hyperglycemia hx on problem list Outpatient Diabetes medications: None Current orders for Inpatient glycemic control: None  Inpatient Diabetes Program Recommendations Correction (SSI): While inpatient, please order CBGs with Novolog sensitive correction ACHS. HgbA1C: Noted A1C has been ordered.  Thanks, Barnie Alderman, RN, MSN, CCRN Diabetes Coordinator Inpatient Diabetes Program [phone] (Team Pager) [phone] (AP office) [phone] [facility] office)

## 2013-03-02 NOTE — ED Notes (Signed)
Ambulatory to restroom steady gait

## 2013-03-02 NOTE — Progress Notes (Signed)
Notified Dr. Tana Coast due to the patients complaint of pain. New order given and followed.

## 2013-03-02 NOTE — Progress Notes (Signed)
[facility] PROGRESS NOTE  Kylar Speelman Andreas HWE:[id number] DOB: 1955-08-29 DOA: 03/02/2013 PCP: Delphina Cahill, MD  Assessment/Plan: Principal Problem:  Influenza A pcr:  Continue with tamiflu. Cont abx started outpatient i.e. augmentin day #2. Chest xray with no obvious infiltrate, but poor inspiration. Oxygen sats normal. Continues with tight cough. Will continue home dulera and singulair. Continue supportive therapy i.e. IV fluids gently.   Active Problems:  Asthma Stable no wheezing on exam. Xray as above. Continue Dulera and singulair.    Lactic acidosis: given one liter NS. Will repeat this am. Continue IV fluid.  Hyperglycemia: no report of steroids recently.  Will check HgA1c. monitor  Hyponatremia: mild. Likely related to acute illness and decreased po intake. Will provide IV fluids and recheck in am.   Sinus tachycardia: related to above.  Resolved   Fibromyalgia Noted and stable   Obstructive sleep apnea: chart review indicates should be using CPAP. Will ask respiratory to consult.      Code Status: full Family Communication: none present Disposition Plan: home when ready hopefully 24-48 hours   Consultants:  none  Procedures:  none  Antibiotics:  augmentin 03/02/13  HPI/Subjective: Lying in bed. Reports feeling better. States headache is gone.   Objective: Filed Vitals:   03/02/13 0854  BP: 130/78  Pulse: 86  Temp: 98.6 F (37 C)  Resp: 20   No intake or output data in the 24 hours ending 03/02/13 1023 Filed Weights   03/02/13 0854  Weight: 64.864 kg (143 lb)    Exam:   General:  Well nourished NAD  Cardiovascular: RRR no m/g/r no LE edema  Respiratory: normal effort BS slightly distant with faint wheeze  Abdomen: obese soft +BS non-tender to palpation  Musculoskeletal: good muscle tone. No clubbing or cyanosis   Data Reviewed: Basic Metabolic Panel:  Recent Labs Lab 03/02/13 0141  NA 133*  K 3.7  CL 95*  CO2 22  GLUCOSE  261*  BUN 21  CREATININE 0.76  CALCIUM 8.6   Liver Function Tests: No results found for this basename: AST, ALT, ALKPHOS, BILITOT, PROT, ALBUMIN,  in the last 168 hours No results found for this basename: LIPASE, AMYLASE,  in the last 168 hours No results found for this basename: AMMONIA,  in the last 168 hours CBC:  Recent Labs Lab 03/02/13 0141  WBC 15.7*  NEUTROABS 11.6*  HGB 12.4  HCT 35.7*  MCV 91.5  PLT 277   Cardiac Enzymes: No results found for this basename: CKTOTAL, CKMB, CKMBINDEX, TROPONINI,  in the last 168 hours BNP (last 3 results) No results found for this basename: PROBNP,  in the last 8760 hours CBG: No results found for this basename: GLUCAP,  in the last 168 hours  No results found for this or any previous visit (from the past 240 hour(s)).   Studies: Dg Chest 2 View  03/02/2013   CLINICAL DATA:  Fever, cough.  History of asthma.  EXAM: CHEST  2 VIEW  COMPARISON:  Chest radiograph April 23, 2012  FINDINGS: Cardiomediastinal silhouette is unremarkable for this low inspiratory examination with crowded vasculature markings. Trace bibasilar strandy densities. The lungs are otherwise clear without pleural effusions or focal consolidations. Trachea projects midline and there is no pneumothorax. Included soft tissue planes and osseous structures are non-suspicious. Surgical clips in the abdomen likely reflect cholecystectomy  IMPRESSION: Minimal bibasilar atelectasis in this low inspiratory examination.   Electronically Signed   By: Elon Alas   On: 03/02/2013 01:59  Scheduled Meds: . sodium chloride   Intravenous STAT  . amoxicillin-clavulanate  1 tablet Oral TID  . cholecalciferol  1,000 Units Oral Daily  . DULoxetine  60 mg Oral Daily  . loratadine  10 mg Oral Daily  . mirabegron ER  50 mg Oral Daily  . mometasone-formoterol  2 puff Inhalation BID  . montelukast  10 mg Oral QHS  . oseltamivir  75 mg Oral BID  . pantoprazole  40 mg Oral Daily    Continuous Infusions: . 0.9 % NaCl with KCl 20 mEq / L      Principal Problem:   Flu-like symptoms Active Problems:   Asthma   OSA (obstructive sleep apnea)   Lactic acidosis   Sinus tachycardia   Fibromyalgia   Elevated lactic acid level   Influenza due to identified novel influenza A virus with other respiratory manifestations    Time spent: 35 minutes    [facility] Pager [phone]. If 7PM-7AM, please contact night-coverage at [email or web address], password [facility] 03/02/2013, 10:23 AM  LOS: 0 days

## 2013-03-02 NOTE — ED Provider Notes (Signed)
CSN: [id number]     Arrival date & time 03/01/13  2326 History   First MD Initiated Contact with Patient 03/02/13 0109     Chief Complaint  Patient presents with  . Fever  . Cough  . Generalized Body Aches   (Consider location/radiation/quality/duration/timing/severity/associated sxs/prior Treatment) Patient is a 58 y.o. female presenting with fever and cough. The history is provided by the patient.  Fever Associated symptoms: cough   Cough Associated symptoms: fever   She has been sick for the last 2 days with cough productive of a small amount of thick, greenish sputum. She's also had fever to 101, chills, sweats. She's complaining of headache and pressure in her sinuses and a sore throat. There's been no nausea or vomiting or diarrhea. She has had generalized bodyaches. She came in tonight because of feeling dyspneic. She was started on amoxicillin-clavulanic acid yesterday but has not seen any improvement. She has used albuterol inhaler with no relief of cough. She's actually been sick for about the last month and has had one course of amoxicillin-clavulanic acid and Medrol Dosepak. Following each of those, she had temporary improvement before getting worse. She has had sick contacts. She states that she did receive the influenza immunization this season  Past Medical History  Diagnosis Date  . Hyperlipidemia   . Asthma   . OSA (obstructive sleep apnea)   . Fibromyalgia   . PCOS (polycystic ovarian syndrome)    Past Surgical History  Procedure Laterality Date  . Cholecystectomy    . Colon surgery    . Vesicovaginal fistula closure w/ tah    . Appendectomy    . Knee surgery  1996   Family History  Problem Relation Age of Onset  . Allergies      "Everyone"  . Asthma Mother   . Heart disease Maternal Grandfather   . Heart disease Maternal Grandmother   . Heart disease Paternal Grandfather   . Heart disease Paternal Grandmother   . Rheum arthritis Mother   . Rheum arthritis  Sister    History  Substance Use Topics  . Smoking status: Never Smoker   . Smokeless tobacco: Never Used  . Alcohol Use: No   OB History   Grav Para Term Preterm Abortions TAB SAB Ect Mult Living                 Review of Systems  Constitutional: Positive for fever.  Respiratory: Positive for cough.   All other systems reviewed and are negative.    Allergies  Nsaids; Cephalexin; Levofloxacin; and Nitrofurantoin monohyd macro  Home Medications   Current Outpatient Rx  Name  Route  Sig  Dispense  Refill  . amoxicillin-clavulanate (AUGMENTIN) 250-125 MG per tablet   Oral   Take 1 tablet by mouth 3 (three) times daily.         . cholecalciferol (VITAMIN D) 1000 UNITS tablet   Oral   Take 1,000 Units by mouth daily.         . DULoxetine (CYMBALTA) 60 MG capsule   Oral   Take 60 mg by mouth daily.         Marland Kitchen estradiol (ESTRING) 2 MG vaginal ring   Vaginal   Place 2 mg vaginally every 3 (three) months. follow package directions         . folic acid (FOLVITE) 130 MCG tablet   Oral   Take 800 mcg by mouth daily.         Marland Kitchen  loratadine (CLARITIN) 10 MG tablet   Oral   Take 10 mg by mouth daily.         . mirabegron ER (MYRBETRIQ) 50 MG TB24 tablet   Oral   Take 50 mg by mouth daily.         . mometasone-formoterol (DULERA) 100-5 MCG/ACT AERO   Inhalation   Inhale 2 puffs into the lungs 2 (two) times daily.   1 Inhaler   5   . montelukast (SINGULAIR) 10 MG tablet   Oral   Take 10 mg by mouth at bedtime.         . Omega-3 Fatty Acids (FISH OIL) 1000 MG CAPS   Oral   Take 1 capsule by mouth daily.         Marland Kitchen omeprazole-sodium bicarbonate (ZEGERID) 40-1100 MG per capsule   Oral   Take 1 capsule by mouth at bedtime.         . fluticasone (FLONASE) 50 MCG/ACT nasal spray   Nasal   Place 1 spray into the nose 2 (two) times daily.         Marland Kitchen LORazepam (ATIVAN) 0.5 MG tablet   Oral   Take 0.5 mg by mouth daily.          BP 135/79   Pulse 120  Temp(Src) 101.1 F (38.4 C) (Oral)  Resp 16  SpO2 98% Physical Exam  Nursing note and vitals reviewed.  58 year old female, resting comfortably and in no acute distress. Vital signs are significant for fever with temperature 101.1, and tachycardia with heart rate of 120. Oxygen saturation is 98%, which is normal. Head is normocephalic and atraumatic. PERRLA, EOMI. Oropharynx is clear. There is tenderness to palpation over maxillary sinuses bilaterally but no. And drainage is seen on nasal exam. Neck is nontender and supple without adenopathy or JVD. Back is nontender and there is no CVA tenderness. Lungs are clear without rales, wheezes, or rhonchi. Chest is nontender. Heart has regular rate and rhythm without murmur. Abdomen is soft, flat, nontender without masses or hepatosplenomegaly and peristalsis is normoactive. Extremities have no cyanosis or edema, full range of motion is present. Skin is warm and dry without rash. Neurologic: Mental status is normal, cranial nerves are intact, there are no motor or sensory deficits.  ED Course  Procedures (including critical care time) Labs Review Results for orders placed during the hospital encounter of 03/02/13  CBC WITH DIFFERENTIAL      Result Value Range   WBC 15.7 (*) 4.0 - 10.5 K/uL   RBC 3.90  3.87 - 5.11 MIL/uL   Hemoglobin 12.4  12.0 - 15.0 g/dL   HCT 35.7 (*) 36.0 - 46.0 %   MCV 91.5  78.0 - 100.0 fL   MCH 31.8  26.0 - 34.0 pg   MCHC 34.7  30.0 - 36.0 g/dL   RDW 13.8  11.5 - 15.5 %   Platelets 277  150 - 400 K/uL   Neutrophils Relative % 74  43 - 77 %   Neutro Abs 11.6 (*) 1.7 - 7.7 K/uL   Lymphocytes Relative 14  12 - 46 %   Lymphs Abs 2.2  0.7 - 4.0 K/uL   Monocytes Relative 10  3 - 12 %   Monocytes Absolute 1.6 (*) 0.1 - 1.0 K/uL   Eosinophils Relative 2  0 - 5 %   Eosinophils Absolute 0.2  0.0 - 0.7 K/uL   Basophils Relative 0  0 - 1 %  Basophils Absolute 0.0  0.0 - 0.1 K/uL  BASIC METABOLIC PANEL       Result Value Range   Sodium 133 (*) 137 - 147 mEq/L   Potassium 3.7  3.7 - 5.3 mEq/L   Chloride 95 (*) 96 - 112 mEq/L   CO2 22  19 - 32 mEq/L   Glucose, Bld 261 (*) 70 - 99 mg/dL   BUN 21  6 - 23 mg/dL   Creatinine, Ser 0.76  0.50 - 1.10 mg/dL   Calcium 8.6  8.4 - 10.5 mg/dL   GFR calc non Af Amer >90  >90 mL/min   GFR calc Af Amer >90  >90 mL/min  LACTIC ACID, PLASMA      Result Value Range   Lactic Acid, Venous 3.6 (*) 0.5 - 2.2 mmol/L  LACTIC ACID, PLASMA      Result Value Range   Lactic Acid, Venous 3.3 (*) 0.5 - 2.2 mmol/L  URINALYSIS, ROUTINE W REFLEX MICROSCOPIC      Result Value Range   Color, Urine YELLOW  YELLOW   APPearance CLEAR  CLEAR   Specific Gravity, Urine 1.020  1.005 - 1.030   pH 5.0  5.0 - 8.0   Glucose, UA >1000 (*) NEGATIVE mg/dL   Hgb urine dipstick LARGE (*) NEGATIVE   Bilirubin Urine NEGATIVE  NEGATIVE   Ketones, ur NEGATIVE  NEGATIVE mg/dL   Protein, ur NEGATIVE  NEGATIVE mg/dL   Urobilinogen, UA 0.2  0.0 - 1.0 mg/dL   Nitrite NEGATIVE  NEGATIVE   Leukocytes, UA NEGATIVE  NEGATIVE  URINE MICROSCOPIC-ADD ON      Result Value Range   Squamous Epithelial / LPF RARE  RARE   RBC / HPF 7-10  <3 RBC/hpf   Imaging Review Dg Chest 2 View  03/02/2013   CLINICAL DATA:  Fever, cough.  History of asthma.  EXAM: CHEST  2 VIEW  COMPARISON:  Chest radiograph April 23, 2012  FINDINGS: Cardiomediastinal silhouette is unremarkable for this low inspiratory examination with crowded vasculature markings. Trace bibasilar strandy densities. The lungs are otherwise clear without pleural effusions or focal consolidations. Trachea projects midline and there is no pneumothorax. Included soft tissue planes and osseous structures are non-suspicious. Surgical clips in the abdomen likely reflect cholecystectomy  IMPRESSION: Minimal bibasilar atelectasis in this low inspiratory examination.   Electronically Signed   By: Elon Alas   On: 03/02/2013 01:59   MDM  No diagnosis  found. Fever, cough, body aches consistent with influenza. It is not clear whether this is a new infection or exacerbation of her previous infection. She'll be sent for a chest x-ray. She'll be given acetaminophen and IV fluids to see if heart rate responds to those.  Heart rate came down to 103 with the above-noted treatment, however it is noted that she has elevated lactic acid level. Lactic acid was repeated after IV fluid bolus and was still significantly elevated even though heart rate has responded appropriately. Case is discussed with Dr. Shanon Brow of [facility] who agrees to admit the patient for ongoing hydration and monitoring of lactic acid level.  Delora Fuel, MD 35/57/32 2025

## 2013-03-03 ENCOUNTER — Ambulatory Visit: Payer: BC Managed Care – PPO

## 2013-03-03 DIAGNOSIS — E872 Acidosis, unspecified: Secondary | ICD-10-CM

## 2013-03-03 DIAGNOSIS — R6889 Other general symptoms and signs: Secondary | ICD-10-CM

## 2013-03-03 DIAGNOSIS — J45909 Unspecified asthma, uncomplicated: Secondary | ICD-10-CM

## 2013-03-03 DIAGNOSIS — IMO0001 Reserved for inherently not codable concepts without codable children: Secondary | ICD-10-CM

## 2013-03-03 LAB — BASIC METABOLIC PANEL
BUN: 13 mg/dL (ref 6–23)
CHLORIDE: 103 meq/L (ref 96–112)
CO2: 21 mEq/L (ref 19–32)
Calcium: 8.7 mg/dL (ref 8.4–10.5)
Creatinine, Ser: 0.68 mg/dL (ref 0.50–1.10)
GFR calc non Af Amer: >90 mL/min (ref >90)
Glucose, Bld: 177 mg/dL — ABNORMAL HIGH (ref 70–99)
Potassium: 3.8 mEq/L (ref 3.7–5.3)
Sodium: 141 mEq/L (ref 137–147)

## 2013-03-03 LAB — HEMOGLOBIN A1C
HEMOGLOBIN A1C: 7.9 % — AB (ref <5.7)
Mean Plasma Glucose: 180 mg/dL — ABNORMAL HIGH (ref <117)

## 2013-03-03 LAB — CBC
HEMATOCRIT: 38.2 % (ref 36.0–46.0)
Hemoglobin: 13.2 g/dL (ref 12.0–15.0)
MCH: 31.9 pg (ref 26.0–34.0)
MCHC: 34.6 g/dL (ref 30.0–36.0)
MCV: 92.3 fL (ref 78.0–100.0)
Platelets: 265 10*3/uL (ref 150–400)
RBC: 4.14 MIL/uL (ref 3.87–5.11)
RDW: 14.1 % (ref 11.5–15.5)
WBC: 9.5 10*3/uL (ref 4.0–10.5)

## 2013-03-03 LAB — GLUCOSE, CAPILLARY
GLUCOSE-CAPILLARY: 162 mg/dL — AB (ref 70–99)
Glucose-Capillary: 112 mg/dL — ABNORMAL HIGH (ref 70–99)

## 2013-03-03 MED ORDER — BENZONATATE 100 MG PO CAPS: 100.0000 mg | ORAL_CAPSULE | Freq: Two times a day (BID) | ORAL | Status: DC | PRN

## 2013-03-03 MED ORDER — GUAIFENESIN-DM 100-10 MG/5ML PO SYRP: 5.0000 mL | ORAL_SOLUTION | ORAL | Status: DC | PRN

## 2013-03-03 MED ORDER — OSELTAMIVIR PHOSPHATE 75 MG PO CAPS: 75.0000 mg | ORAL_CAPSULE | Freq: Two times a day (BID) | ORAL | Status: AC

## 2013-03-03 MED ORDER — AMOXICILLIN-POT CLAVULANATE 250-125 MG PO TABS: 1.0000 | ORAL_TABLET | Freq: Three times a day (TID) | ORAL | Status: AC

## 2013-03-03 NOTE — Discharge Summary (Signed)
Physician Discharge Summary  Stephanie Norris LKG:[id number] DOB: October 17, 1955 DOA: 03/02/2013  PCP: Delphina Cahill, MD  Admit date: 03/02/2013 Discharge date: 03/03/2013  Time spent: 40 minutes  Recommendations for Outpatient Follow-up:  1. Dr. Nevada Crane in 1-2 weeks for evaluation of symptoms. Recommend following HgA1c and hyperglycemia  Discharge Diagnoses:  Principal Problem:   Flu-like symptoms Active Problems:   Asthma   OSA (obstructive sleep apnea)   Lactic acidosis   Sinus tachycardia   Fibromyalgia   Elevated lactic acid level   Influenza due to identified novel influenza A virus with other respiratory manifestations   Hyperglycemia   Discharge Condition: stable  Diet recommendation: heart healthy carb modified  Filed Weights   03/02/13 0854 03/02/13 1215  Weight: 64.864 kg (143 lb) 63.9 kg (140 lb 14 oz)    History of present illness:  58 yo female with fibromyalgia, asthma comes in on 03/02/13 with 2 days of cough, nasal congestion. She called her doc the day prior to presentation who  called in augmentin for her. No steroids. Fever one day. Malaise. No le edema or swelling. She has hx  bad allergies, and was given abx about 3 weeks prior also for acute bronchitis, augmentin. No sick contacts. No dysuria.   Hospital Course:  Influenza A pcr:  Positive. Started on tamiflu and she will continue to complete 5 days. Cont abx started outpatient i.e. augmentin day #2. Chest xray with no obvious infiltrate, but poor inspiration. Oxygen sats normal. Continues with tight cough. Continue home dulera and singulair. No indication for steroids at this time. At discharge feeling much better. Able to maintain hydration and nutrition. Recommend follow up with PCP in 1-2 weeks for evaluation of symptoms.   Active Problems:  Asthma Stable no wheezing on exam. Xray as above. Continue Dulera and singulair.  Lactic acidosis: resolved at discharge. Provided with IV fluids. Remained afebrile and  non-toxic appearing during this hospitalization    Hyperglycemia: no report of steroids recently. HgA1c in process at discharge. Follow up with PCP  Hyponatremia: mild. Resolved at discharge.  Likely related to acute illness and decreased po intake.    Sinus tachycardia: related to above. Resolved  Fibromyalgia Noted and stable  Obstructive sleep apnea: chart review indicates should be using CPAP.         Procedures:  Consultations:  none  Discharge Exam: Filed Vitals:   03/03/13 0601  BP: 124/60  Pulse: 94  Temp: 98 F (36.7 C)  Resp: 18    General: well nourished alert NAD Cardiovascular: RRR No MGR no LE edema Respiratory: normal effort BS clear bilaterally no wheeze no rhonchi Abdomen: obese soft +BS non-tender to palpation  Discharge Instructions   Future Appointments Provider Department Dept Phone   03/06/2013 2:30 PM Samie Barclift, [place] [phone]   05/06/2013 11:15 AM Kathee Delton, MD [facility] [phone]       Medication List         amoxicillin-clavulanate 250-125 MG per tablet  Commonly known as:  AUGMENTIN  Take 1 tablet by mouth 3 (three) times daily.     cholecalciferol 1000 UNITS tablet  Commonly known as:  VITAMIN D  Take 1,000 Units by mouth daily.     DULoxetine 60 MG capsule  Commonly known as:  CYMBALTA  Take 60 mg by mouth daily.     estradiol 2 MG vaginal ring  Commonly known as:  ESTRING  Place 2 mg vaginally every 3 (three) months.  follow package directions     Fish Oil 1000 MG Caps  Take 1 capsule by mouth daily.     folic acid [id number] MCG tablet  Commonly known as:  FOLVITE  Take 800 mcg by mouth daily.     guaiFENesin-dextromethorphan 100-10 MG/5ML syrup  Commonly known as:  ROBITUSSIN DM  Take 5 mLs by mouth every 4 (four) hours as needed for cough.     loratadine 10 MG tablet  Commonly known as:  CLARITIN  Take 10 mg by mouth daily.     mometasone-formoterol  100-5 MCG/ACT Aero  Commonly known as:  DULERA  Inhale 2 puffs into the lungs 2 (two) times daily.     montelukast 10 MG tablet  Commonly known as:  SINGULAIR  Take 10 mg by mouth at bedtime.     MYRBETRIQ 50 MG Tb24 tablet  Generic drug:  mirabegron ER  Take 50 mg by mouth daily.     omeprazole-sodium bicarbonate 40-1100 MG per capsule  Commonly known as:  ZEGERID  Take 1 capsule by mouth at bedtime.     oseltamivir 75 MG capsule  Commonly known as:  TAMIFLU  Take 1 capsule (75 mg total) by mouth 2 (two) times daily.       Allergies  Allergen Reactions  . Nsaids Anaphylaxis  . Cephalexin     rash  . Levofloxacin     rash  . Nitrofurantoin Monohyd Macro     rash       Follow-up Information   Follow up with Delphina Cahill, MD. Schedule an appointment as soon as possible for a visit in 1 week. (for evaluation of symptoms)    Specialty:  Internal Medicine   Contact information:    [place] [phone] [phone]        The results of significant diagnostics from this hospitalization (including imaging, microbiology, ancillary and laboratory) are listed below for reference.    Significant Diagnostic Studies: Dg Chest 2 View  03/02/2013   CLINICAL DATA:  Fever, cough.  History of asthma.  EXAM: CHEST  2 VIEW  COMPARISON:  Chest radiograph April 23, 2012  FINDINGS: Cardiomediastinal silhouette is unremarkable for this low inspiratory examination with crowded vasculature markings. Trace bibasilar strandy densities. The lungs are otherwise clear without pleural effusions or focal consolidations. Trachea projects midline and there is no pneumothorax. Included soft tissue planes and osseous structures are non-suspicious. Surgical clips in the abdomen likely reflect cholecystectomy  IMPRESSION: Minimal bibasilar atelectasis in this low inspiratory examination.   Electronically Signed   By: Elon Alas   On: 03/02/2013 01:59    Microbiology: No results  found for this or any previous visit (from the past 240 hour(s)).   Labs: Basic Metabolic Panel:  Recent Labs Lab 03/02/13 0141 03/03/13 0451  NA 133* 141  K 3.7 3.8  CL 95* 103  CO2 22 21  GLUCOSE 261* 177*  BUN 21 13  CREATININE 0.76 0.68  CALCIUM 8.6 8.7   Liver Function Tests: No results found for this basename: AST, ALT, ALKPHOS, BILITOT, PROT, ALBUMIN,  in the last 168 hours No results found for this basename: LIPASE, AMYLASE,  in the last 168 hours No results found for this basename: AMMONIA,  in the last 168 hours CBC:  Recent Labs Lab 03/02/13 0141 03/03/13 0451  WBC 15.7* 9.5  NEUTROABS 11.6*  --   HGB 12.4 13.2  HCT 35.7* 38.2  MCV 91.5 92.3  PLT 277  265   Cardiac Enzymes: No results found for this basename: CKTOTAL, CKMB, CKMBINDEX, TROPONINI,  in the last 168 hours BNP: BNP (last 3 results) No results found for this basename: PROBNP,  in the last 8760 hours CBG:  Recent Labs Lab 03/02/13 1651 03/02/13 2038 03/03/13 0803  GLUCAP 132* 195* 162*       Signed:  BLACK,KAREN M  [facility] 03/03/2013, 10:44 AM

## 2013-03-03 NOTE — Progress Notes (Signed)
Pt discharged home today. Pt's IV site D/C'd and WNL. Pt's VSS. Pt provided with home medication list, discharge instructions and informed on where to pick up prescriptions. Verbalized understanding. Pt left floor via WC in stable condition accompanied by NT.

## 2013-03-06 ENCOUNTER — Ambulatory Visit: Payer: Self-pay | Admitting: Nurse Practitioner

## 2013-03-09 NOTE — Discharge Summary (Signed)
Pt seen and examined. Agree with Ms Stephanie Norris note of summary.   Hosie Poisson, MD

## 2013-03-18 ENCOUNTER — Ambulatory Visit
Admission: RE | Admit: 2013-03-18 | Discharge: 2013-03-18 | Disposition: A | Discharge status: Home / Self Care | Payer: BC Managed Care – PPO | Source: Ambulatory Visit

## 2013-03-18 DIAGNOSIS — Z1231 Encounter for screening mammogram for malignant neoplasm of breast: Secondary | ICD-10-CM

## 2013-03-23 ENCOUNTER — Encounter: Payer: Self-pay | Admitting: Nurse Practitioner

## 2013-03-23 ENCOUNTER — Ambulatory Visit (INDEPENDENT_AMBULATORY_CARE_PROVIDER_SITE_OTHER): Payer: BC Managed Care – PPO | Admitting: Nurse Practitioner

## 2013-03-23 VITALS — BP 130/74 | HR 80 | Ht 59.5 in | Wt 144.0 lb

## 2013-03-23 DIAGNOSIS — Z1211 Encounter for screening for malignant neoplasm of colon: Secondary | ICD-10-CM

## 2013-03-23 DIAGNOSIS — Z01419 Encounter for gynecological examination (general) (routine) without abnormal findings: Secondary | ICD-10-CM

## 2013-03-23 DIAGNOSIS — R319 Hematuria, unspecified: Secondary | ICD-10-CM

## 2013-03-23 DIAGNOSIS — Z0000 Encounter for general adult medical examination without abnormal findings: Secondary | ICD-10-CM

## 2013-03-23 LAB — POCT URINALYSIS DIPSTICK
Bilirubin, UA: NEGATIVE
Glucose, UA: 100
Ketones, UA: NEGATIVE
Nitrite, UA: NEGATIVE
UROBILINOGEN UA: NEGATIVE
pH, UA: 5

## 2013-03-23 MED ORDER — ESTRADIOL 2 MG VA RING: 2.0000 mg | VAGINAL_RING | VAGINAL | Status: DC

## 2013-03-23 NOTE — Progress Notes (Signed)
Patient ID: Stephanie Norris, female   DO: 10/25/1955, 58 y.o.   MRM: [id number] 58 y.o. [name] Married Caucasian Fe here for annual exam.  Now diagnosed with DM and taking Glucophage since last Thursday.  Still wants to continue with Estring.  She has less UTI symptoms with use of the ring.  She has history of hematuria since age 43 and has had several negative Uro evaluations.    No LMP recorded. Patient has had a hysterectomy.          Sexually active: no  The current method of family planning is none.    Exercising: no  The patient does not participate in regular exercise at present. Smoker:  no  Health Maintenance: Pap:  TVH, 2004 MMG:  03/18/13, Bi-Rads 1: negative Colonoscopy:  2009, recheck 5 years BMD:  12-2008, -0.8/-1.5/-1.8 TDaP:  2012 Labs:  PCP Urine:  Large RBC, trace leuk's, trace protein, 100 glucose, pH 5.0   reports that she has never smoked. She has never used smokeless tobacco. She reports that she does not drink alcohol or use illicit drugs.  Past Medical History  Diagnosis Date  . Hyperlipidemia   . Asthma   . OSA (obstructive sleep apnea)   . Fibromyalgia   . PCOS (polycystic ovarian syndrome)     Past Surgical History  Procedure Laterality Date  . Cholecystectomy    . Colon surgery    . Vesicovaginal fistula closure w/ tah    . Appendectomy    . Knee surgery  1996    Current Outpatient Prescriptions  Medication Sig Dispense Refill  . cholecalciferol (VITAMIN D) 1000 UNITS tablet Take 1,000 Units by mouth daily.      . DULoxetine (CYMBALTA) 60 MG capsule Take 60 mg by mouth daily.      Marland Kitchen estradiol (ESTRING) 2 MG vaginal ring Place 2 mg vaginally every 3 (three) months. follow package directions      . folic acid (FOLVITE) 175 MCG tablet Take 800 mcg by mouth daily.      Marland Kitchen guaiFENesin-dextromethorphan (ROBITUSSIN DM) 100-10 MG/5ML syrup Take 5 mLs by mouth every 4 (four) hours as needed for cough.  118 mL  0  . loratadine (CLARITIN) 10 MG tablet Take 10 mg  by mouth daily.      . metFORMIN (GLUCOPHAGE) 500 MG tablet Take 500 mg by mouth daily with breakfast.      . mirabegron ER (MYRBETRIQ) 50 MG TB24 tablet Take 50 mg by mouth daily.      . mometasone-formoterol (DULERA) 100-5 MCG/ACT AERO Inhale 2 puffs into the lungs 2 (two) times daily.  1 Inhaler  5  . montelukast (SINGULAIR) 10 MG tablet Take 10 mg by mouth at bedtime.      . Omega-3 Fatty Acids (FISH OIL) 1000 MG CAPS Take 1 capsule by mouth daily.      Marland Kitchen omeprazole-sodium bicarbonate (ZEGERID) 40-1100 MG per capsule Take 1 capsule by mouth at bedtime.      . benzonatate (TESSALON) 100 MG capsule Take 1 capsule (100 mg total) by mouth 2 (two) times daily as needed for cough.  20 capsule  0   No current facility-administered medications for this visit.    Family History  Problem Relation Age of Onset  . Allergies      "Everyone"  . Asthma Mother   . Heart disease Maternal Grandfather   . Heart disease Maternal Grandmother   . Heart disease Paternal Grandfather   . Heart disease Paternal  Grandmother   . Rheum arthritis Mother   . Rheum arthritis Sister     ROS:  Pertinent items are noted in HPI.  Otherwise, a comprehensive ROS was negative.  Exam:   BP 130/74  Pulse 80  Ht 4' 11.5" (1.511 m)  Wt 144 lb (65.318 kg)  BMI 28.61 kg/m2 Height: 4' 11.5" (151.1 cm)  Ht Readings from Last 3 Encounters:  03/23/13 4' 11.5" (1.511 m)  03/02/13 4' 11.5" (1.511 m)  11/05/12 5' (1.524 m)    General appearance: alert, cooperative and appears stated age Head: Normocephalic, without obvious abnormality, atraumatic Neck: no adenopathy, supple, symmetrical, trachea midline and thyroid normal to inspection and palpation Lungs: clear to auscultation bilaterally Breasts: normal appearance, no masses or tenderness Heart: regular rate and rhythm Abdomen: soft, non-tender; no masses,  no organomegaly Extremities: extremities normal, atraumatic, no cyanosis or edema Skin: Skin color, texture,  turgor normal. No rashes or lesions Lymph nodes: Cervical, supraclavicular, and axillary nodes normal. No abnormal inguinal nodes palpated Neurologic: Grossly normal   Pelvic: External genitalia:  no lesions              Urethra:  normal appearing urethra with no masses, tenderness or lesions              Bartholin's and Skene's: normal                 Vagina: normal appearing vagina with normal color and discharge, no lesions              Cervix: absent              Pap taken: no Bimanual Exam:  Uterus:  uterus absent              Adnexa: no mass, fullness, tenderness               Rectovaginal: Confirms               Anus:  normal sphincter tone, no lesions  A:  Well Woman with normal exam  Postmenopausal on ERT 2005- 2012  S/P TVH/ BSO/ A&P repair with sling 10/2002  Atrophic vaginitis with use of Estring - helps with UTI's  New diagnosis of DM  History of micro Hematuria, R/O UTI    P:   Pap smear as per guidelines   Mammogram is due 2/16  Will make a referral to Dr. Collene Mares for repeat colonscopy  Refill of Estring for a year  Counseled with potential risk of DVT, CVA, cancer, etc.  Counseled on breast self exam, mammography screening, use and side effects of HRT, adequate intake of calcium and vitamin D, diet and exercise, Kegel's exercises return annually or prn  An After Visit Summary was printed and given to the patient.

## 2013-03-23 NOTE — Patient Instructions (Signed)

## 2013-03-24 NOTE — Progress Notes (Signed)
Encounter reviewed by Dr. Brook Silva.  

## 2013-03-25 ENCOUNTER — Other Ambulatory Visit: Payer: Self-pay | Admitting: Certified Nurse Midwife

## 2013-03-25 ENCOUNTER — Telehealth: Payer: Self-pay

## 2013-03-25 DIAGNOSIS — N390 Urinary tract infection, site not specified: Secondary | ICD-10-CM

## 2013-03-25 LAB — URINE CULTURE: Colony Count: >=100000

## 2013-03-25 MED ORDER — ERYTHROMYCIN BASE 250 MG PO TABS: 250.0000 mg | ORAL_TABLET | Freq: Four times a day (QID) | ORAL | Status: DC

## 2013-03-25 MED ORDER — SULFAMETHOXAZOLE-TMP DS 800-160 MG PO TABS: 1.0000 | ORAL_TABLET | Freq: Two times a day (BID) | ORAL | Status: DC

## 2013-03-25 NOTE — Telephone Encounter (Signed)
lmtcb

## 2013-03-25 NOTE — Telephone Encounter (Signed)
Patient notified of results. See phone note

## 2013-03-25 NOTE — Telephone Encounter (Signed)
Message copied by Susy Manor on Wed Mar 25, 2013 12:23 PM ------      Message from: Regina Eck      Created: Wed Mar 25, 2013 12:17 PM       Notify patient that urine culture was positive for E.Coli needs Rx Bactrim DS x 5 days      Will need TOC in 2 weeks with culture, order in for both ------

## 2013-04-08 ENCOUNTER — Telehealth: Payer: Self-pay | Admitting: Nurse Practitioner

## 2013-04-08 ENCOUNTER — Ambulatory Visit: Payer: BC Managed Care – PPO

## 2013-04-08 NOTE — Telephone Encounter (Signed)
Patient had appt for urine reck today but canceled said she seen her pcp and they checked her urine and are going to send [place] the results.

## 2013-05-06 ENCOUNTER — Encounter: Payer: Self-pay | Admitting: Pulmonary Disease

## 2013-05-06 ENCOUNTER — Ambulatory Visit (INDEPENDENT_AMBULATORY_CARE_PROVIDER_SITE_OTHER): Payer: BC Managed Care – PPO | Admitting: Pulmonary Disease

## 2013-05-06 ENCOUNTER — Encounter (INDEPENDENT_AMBULATORY_CARE_PROVIDER_SITE_OTHER): Payer: Self-pay

## 2013-05-06 VITALS — BP 118/78 | HR 89 | Temp 98.0°F | Ht 59.75 in | Wt 142.0 lb

## 2013-05-06 DIAGNOSIS — J45909 Unspecified asthma, uncomplicated: Secondary | ICD-10-CM

## 2013-05-06 DIAGNOSIS — G4733 Obstructive sleep apnea (adult) (pediatric): Secondary | ICD-10-CM

## 2013-05-06 NOTE — Assessment & Plan Note (Signed)
The patient feels that her asthma is adequately controlled currently, but has had some increased airway resistance since the allergy season has started. She is staying on her nasal spray and Singulair, but has not been taking an antihistamine regularly. I have asked her to do this until the allergy issue improves. She has not been using her rescue inhaler recently. I have asked her to continue on dulera.

## 2013-05-06 NOTE — Patient Instructions (Signed)
Continue on your current asthma and allergy medications, and would consider taking an antihistamine every day as well until the allergy season passes.  Continue on cpap, and work on weight loss and conditioning followup with me again in 41mos.

## 2013-05-06 NOTE — Assessment & Plan Note (Addendum)
The patient is wearing CPAP compliantly, and feels that her new machine is much better than her prior. He is having no issues with her mask fit or pressure.  Her download today shows excellent compliance, and good control of her obstructive events.

## 2013-05-06 NOTE — Progress Notes (Signed)
   Subjective:    Patient ID: Stephanie Norris, female    DOB: 08-15-1955, 58 y.o.   MRN: [id number]  HPI Patient comes in today to followup her sleep apnea as well as her asthma. She has gotten a new CPAP machine, and overall feels that she is doing well. She denies any mask fit issues or pressure problems. She also feels that her asthma is doing fairly well on her maintenance regimen, but has had some increased air flow resistance with the onset of spring allergy season. She is continuing on her medications compliantly, but is not taking a daily antihistamine. She has not had an acute exacerbation since the last visit.   Review of Systems  Constitutional: Negative for fever and unexpected weight change.  HENT: Negative for congestion, dental problem, ear pain, nosebleeds, postnasal drip, rhinorrhea, sinus pressure, sneezing, sore throat and trouble swallowing.   Eyes: Negative for redness and itching.  Respiratory: Positive for cough. Negative for chest tightness, shortness of breath and wheezing.   Cardiovascular: Negative for palpitations and leg swelling.  Gastrointestinal: Negative for nausea and vomiting.  Genitourinary: Negative for dysuria.  Musculoskeletal: Negative for joint swelling.  Skin: Negative for rash.  Neurological: Negative for headaches.  Hematological: Does not bruise/bleed easily.  Psychiatric/Behavioral: Negative for dysphoric mood. The patient is not nervous/anxious.        Objective:   Physical Exam Well-developed female in no acute distress Nose without purulence or discharge noted No skin breakdown or pressure necrosis from the CPAP mask Neck without lymphadenopathy or thyromegaly Chest with totally clear breath sounds, no active wheezing Cardiac exam with regular rate and rhythm Lower extremities without edema, no cyanosis Alert and oriented, moves all 4 extremities.       Assessment & Plan:

## 2013-05-26 ENCOUNTER — Other Ambulatory Visit (HOSPITAL_COMMUNITY): Payer: Self-pay | Admitting: Internal Medicine

## 2013-05-26 ENCOUNTER — Ambulatory Visit (HOSPITAL_COMMUNITY)
Admission: RE | Admit: 2013-05-26 | Discharge: 2013-05-26 | Disposition: A | Discharge status: Home / Self Care | Payer: BC Managed Care – PPO | Source: Ambulatory Visit | Attending: Internal Medicine | Admitting: Internal Medicine

## 2013-05-26 DIAGNOSIS — R059 Cough, unspecified: Secondary | ICD-10-CM | POA: Insufficient documentation

## 2013-05-26 DIAGNOSIS — R05 Cough: Secondary | ICD-10-CM

## 2013-05-26 DIAGNOSIS — R0989 Other specified symptoms and signs involving the circulatory and respiratory systems: Secondary | ICD-10-CM

## 2013-05-27 ENCOUNTER — Ambulatory Visit: Payer: BC Managed Care – PPO

## 2013-06-03 ENCOUNTER — Ambulatory Visit: Payer: BC Managed Care – PPO

## 2013-06-10 ENCOUNTER — Ambulatory Visit: Payer: BC Managed Care – PPO

## 2013-06-17 ENCOUNTER — Encounter: Payer: BC Managed Care – PPO | Attending: Endocrinology

## 2013-06-17 VITALS — Ht 59.75 in | Wt 140.0 lb

## 2013-06-17 DIAGNOSIS — Z713 Dietary counseling and surveillance: Secondary | ICD-10-CM | POA: Insufficient documentation

## 2013-06-17 DIAGNOSIS — E119 Type 2 diabetes mellitus without complications: Secondary | ICD-10-CM | POA: Insufficient documentation

## 2013-06-17 DIAGNOSIS — R739 Hyperglycemia, unspecified: Secondary | ICD-10-CM

## 2013-06-21 NOTE — Progress Notes (Signed)
Patient was seen on 06/17/13 for the first of a series of three diabetes self-management courses at the [facility].  Current HbA1c: 6.2%  The following learning objectives were met by the patient during this class:  Describe diabetes  State some common risk factors for diabetes  Defines the role of glucose and insulin  Identifies type of diabetes and pathophysiology  Describe the relationship between diabetes and cardiovascular risk  State the members of the Healthcare Team  States the rationale for glucose monitoring  State when to test glucose  State their individual Target Range  State the importance of logging glucose readings  Describe how to interpret glucose readings  Identifies A1C target  Explain the correlation between A1c and eAG values  State symptoms and treatment of high blood glucose  State symptoms and treatment of low blood glucose  Explain proper technique for glucose testing  Identifies proper sharps disposal  Handouts given during class include:  Living Well with Diabetes book  Carb Counting and Meal Planning book  Meal Plan Card  Carbohydrate guide  Meal planning worksheet  Low Sodium Flavoring Tips  The diabetes portion plate  P5T to eAG Conversion Chart  Diabetes Medications  Diabetes Recommended Care Schedule  Support Group  Diabetes Success Plan  Core Class Satisfaction Survey  Follow-Up Plan:  Attend core 2

## 2013-06-24 DIAGNOSIS — E119 Type 2 diabetes mellitus without complications: Secondary | ICD-10-CM

## 2013-06-24 NOTE — Progress Notes (Signed)

## 2013-07-01 ENCOUNTER — Ambulatory Visit: Payer: BC Managed Care – PPO

## 2013-07-09 ENCOUNTER — Encounter: Payer: BC Managed Care – PPO | Attending: Endocrinology

## 2013-07-09 DIAGNOSIS — Z713 Dietary counseling and surveillance: Secondary | ICD-10-CM | POA: Insufficient documentation

## 2013-07-09 DIAGNOSIS — E119 Type 2 diabetes mellitus without complications: Secondary | ICD-10-CM

## 2013-07-09 NOTE — Progress Notes (Signed)
Patient was seen on 07/09/13 for the third of a series of three diabetes self-management courses at the [facility]. The following learning objectives were met by the patient during this class:    State the amount of activity recommended for healthy living   Describe activities suitable for individual needs   Identify ways to regularly incorporate activity into daily life   Identify barriers to activity and ways to over come these barriers  Identify diabetes medications being personally used and their primary action for lowering glucose and possible side effects   Describe role of stress on blood glucose and develop strategies to address psychosocial issues   Identify diabetes complications and ways to prevent them  Explain how to manage diabetes during illness   Evaluate success in meeting personal goal   Establish 2-3 goals that they will plan to diligently work on until they return for the  28-monthfollow-up visit  Goals:  Follow Diabetes Meal Plan as instructed  Aim for 15-30 mins of physical activity daily as tolerated  Bring food record and glucose log to your follow up visit  Your patient has established the following 4 month goals in their individualized success plan: I will be aware of my carb choices at most meals and snacks I will increase my activity level at least 3 days a week I will take my diabetes medications as scheduled Stop eating sausage for breakfast I will test my glucose at least 1 times a day, 7 days a week I will look at patterns in my record book at least 2 days a month    Your patient has identified these potential barriers to change:  Managing my stress  motivation  Finances  Husband  Your patient has identified their diabetes self-care support plan as  [facility]  Extended family  Plan:  Attend Core 4 in 4 months

## 2013-08-26 ENCOUNTER — Ambulatory Visit (HOSPITAL_COMMUNITY)
Admission: RE | Admit: 2013-08-26 | Discharge: 2013-08-26 | Disposition: A | Discharge status: Home / Self Care | Payer: BC Managed Care – PPO | Source: Ambulatory Visit | Attending: Rheumatology | Admitting: Rheumatology

## 2013-08-26 DIAGNOSIS — M545 Low back pain, unspecified: Secondary | ICD-10-CM | POA: Insufficient documentation

## 2013-08-26 DIAGNOSIS — M539 Dorsopathy, unspecified: Secondary | ICD-10-CM | POA: Insufficient documentation

## 2013-08-26 DIAGNOSIS — M2560 Stiffness of unspecified joint, not elsewhere classified: Secondary | ICD-10-CM | POA: Insufficient documentation

## 2013-08-26 DIAGNOSIS — IMO0001 Reserved for inherently not codable concepts without codable children: Secondary | ICD-10-CM | POA: Insufficient documentation

## 2013-08-26 NOTE — Evaluation (Signed)
Physical Therapy Evaluation  Patient Details  Name: Stephanie Norris MRN: [id number] Date of Birth: 1956-02-02  Today's Date: 08/26/2013 Time: 1015-1100 PT Time Calculation (min): 45 min Charge:  evaluation             Visit#: 1 of 8  Re-eval: 09/25/13 Assessment Diagnosis: LBP Next MD Visit: 11/05/2013 Prior Therapy: none Past Medical History:  Past Medical History  Diagnosis Date  . Hyperlipidemia   . Asthma   . OSA (obstructive sleep apnea)   . Fibromyalgia   . PCOS (polycystic ovarian syndrome)   . Diabetes mellitus without complication    Past Surgical History:  Past Surgical History  Procedure Laterality Date  . Cholecystectomy    . Vesicovaginal fistula closure w/ tah    . Appendectomy    . Knee surgery  1996  . Colonoscopy w/ biopsies  fall 2009  . Combined hysterectomy vaginal / oophorectomy / a&p repair  10/2002    endometriosis, cystocele, fibroids    Subjective Symptoms/Limitations Symptoms: Ms. Altamirano states that she has had low back pain for years but the pain increased approximately two weeks ago.  She states that she is unable to stand for any period of time without having to give into her back pain.  She has more pain in her right side and denies radiculopathy.   She has peripheral neuropathy.  She has been referred for PT to maximize her functional ability. How long can you sit comfortably?: straight back chair 20-30 minutes How long can you stand comfortably?: less than ten minutes  How long can you walk comfortably?: Pt does not walk for exercises.   Patient Stated Goals: stronger back so she is able to do more  Pain Assessment Currently in Pain?: Yes Pain Score: 5  (worst pain 8/10; best 2/10) Pain Location: Back Pain Orientation: Lower Pain Onset: More than a month ago Pain Frequency: Constant Pain Relieving Factors: water aerobic;    Balance Screening  no falls   Cognition/Observation Observation/Other Assessments Observations: Pt ambulates  very stiffly  Sensation/Coordination/Flexibility/Functional Tests Functional Tests Functional Tests: foto  37  Assessment RLE Strength Right Hip Flexion: 5/5 Right Hip Extension: 5/5 Right Hip ABduction: 5/5 Right Hip ADduction: 5/5 Right Knee Flexion: 5/5 Right Knee Extension: 5/5 Right Ankle Dorsiflexion:  (5-/5) LLE Strength Left Hip Flexion: 5/5 Left Hip Extension: 5/5 Left Hip ABduction: 5/5 Left Knee Flexion: 5/5 Left Knee Extension: 5/5 Left Ankle Dorsiflexion: 5/5 Lumbar AROM Lumbar Flexion: wm; reps increse pain. Lumbar Extension: decreased 20% Lumbar - Right Side Bend: wnl with increased pain Lumbar - Left Side Bend: wnl with decreased pain Lumbar - Right Rotation: wfl Lumbar - Left Rotation: wfl Palpation Palpation: tight paraspinal mm  Exercise/Treatments Mobility/Balance  Posture/Postural Control Posture/Postural Control: Postural limitations Postural Limitations: increased kyphosis decreased lordosis   Stretches Active Hamstring Stretch: 3 reps;30 seconds Single Knee to Chest Stretch: 3 reps;30 seconds Standing Side Bend: 5 reps;Limitations Standing Side Bend Limitations: Lt only Standing Extension: 5 reps Prone on Elbows Stretch: 5 reps Other Seated Lumbar Exercises: W-back     Physical Therapy Assessment and Plan PT Assessment and Plan Clinical Impression Statement: Pt is a 58 yo female with chronic low back pain that has exacerbated over the past two weeks.  Examination demonstrates very stiff thoracic and lumbar area; strength is functional.  Pt will benefit from skilled therapy to imporve her mobility and decrease her pain to improve pt quality of life.  Pt will benefit from skilled therapeutic intervention in  order to improve on the following deficits: Decreased activity tolerance;Pain;Increased fascial restricitons Rehab Potential: Good PT Frequency: Min 2X/week PT Duration: 4 weeks PT Treatment/Interventions: Therapeutic  activities;Therapeutic exercise;Manual techniques PT Plan: begin hip and thoracic excursions; pressup begin prone scapular exercises as well as manual techniques.    Goals Home Exercise Program Pt/caregiver will Perform Home Exercise Program: For increased ROM PT Goal: Perform Home Exercise Program - Progress: Goal set today PT Short Term Goals Time to Complete Short Term Goals: 2 weeks PT Short Term Goal 1: Pt pain to be no greater than a 5/10  PT Short Term Goal 2: Pt to be walking at least 15 minutes a day for better health habits PT Short Term Goal 3: Pt to be able to stand for 15 minutes without increased pain to allow waiting in lines PT Long Term Goals Time to Complete Long Term Goals: 4 weeks PT Long Term Goal 1: I in advance HEP PT Long Term Goal 2: Pt to be able to stand for 30 mintues to be able to make a meal without having to sit down Long Term Goal 3: Pt to be walking 30 minutes a day for improved health habits.  Long Term Goal 4: Pt pain to be no greater than a 3/10   Problem List Patient Active Problem List   Diagnosis Date Noted  . Stiffness of joints, not elsewhere classified, multiple sites 08/26/2013  . Flu-like symptoms 03/02/2013  . Lactic acidosis 03/02/2013  . Sinus tachycardia 03/02/2013  . Elevated lactic acid level 03/02/2013  . Influenza due to identified novel influenza A virus with other respiratory manifestations 03/02/2013  . Hyperglycemia 03/02/2013  . Fibromyalgia   . OSA (obstructive sleep apnea) 06/22/2011  . DOE (dyspnea on exertion) 05/30/2011  . Asthma 03/30/2011    PT Plan of Care PT Home Exercise Plan: given   GP    RUSSELL,CINDY 08/26/2013, 12:14 PM  Physician Documentation Your signature is required to indicate approval of the treatment plan as stated above.  Please sign and either send electronically or make a copy of this report for your files and return this physician signed original.   Please mark one 1.__approve of plan  2.  ___approve of plan with the following conditions.   ______________________________                                                          _____________________ Physician Signature                                                                                                             Date

## 2013-09-01 ENCOUNTER — Ambulatory Visit (HOSPITAL_COMMUNITY)
Admission: RE | Admit: 2013-09-01 | Discharge: 2013-09-01 | Disposition: A | Discharge status: Home / Self Care | Payer: BC Managed Care – PPO | Source: Ambulatory Visit | Attending: Rheumatology | Admitting: Rheumatology

## 2013-09-01 NOTE — Progress Notes (Signed)
Physical Therapy Treatment Patient Details  Name: Stephanie Norris MRN: [id number] Date of Birth: 1955-11-15  Today's Date: 09/01/2013 Time: 2703-5009 PT Time Calculation (min): 45 min Charge: there ex 3818-2993; manual 1220-1233 Visit#: 2 of 8  Re-eval: 09/25/13    Subjective: Symptoms/Limitations Symptoms: Pt states she has not been able to complete the exercises everyday. Pain Assessment Pain Score: 2  Pain Location: Back      Exercise/Treatments    Stretches Active Hamstring Stretch: 3 reps;30 seconds Single Knee to Chest Stretch: 3 reps;30 seconds Standing Side Bend: 5 reps;Limitations Standing Side Bend Limitations: Lt only Standing Extension: 5 reps Prone on Elbows Stretch: 5 reps Press Ups: 5 reps Seated Other Seated Lumbar Exercises: 3-D thoracic excursion Supine Ab Set: 5 reps Bridge: 10 reps Other Supine Lumbar Exercises: 3-D lumbar excursion   Prone  Other Prone Lumbar Exercises: rows/shoulder extension 10 x @  Manual Therapy Manual Therapy: Joint mobilization Joint Mobilization: mm massage with jt mobs for Lt rotation,(Grade II to III on Rt facet )  for lumbar spine   Physical Therapy Assessment and Plan PT Assessment and Plan Clinical Impression Statement: Pt able to complete new exercises with good form with multimodal cuing.  All exercises were facilitated by therapist.  Pt lumbar vertebrae are slightly rotated to right.   PT Plan: Begin T band exercises as well as prone w-back     Goals  progressing  Problem List Patient Active Problem List   Diagnosis Date Noted  . Stiffness of joints, not elsewhere classified, multiple sites 08/26/2013  . Flu-like symptoms 03/02/2013  . Lactic acidosis 03/02/2013  . Sinus tachycardia 03/02/2013  . Elevated lactic acid level 03/02/2013  . Influenza due to identified novel influenza A virus with other respiratory manifestations 03/02/2013  . Hyperglycemia 03/02/2013  . Fibromyalgia   . OSA (obstructive  sleep apnea) 06/22/2011  . DOE (dyspnea on exertion) 05/30/2011  . Asthma 03/30/2011       GP    RUSSELL,CINDY 09/01/2013, 12:35 PM

## 2013-09-03 ENCOUNTER — Ambulatory Visit (HOSPITAL_COMMUNITY)
Admission: RE | Admit: 2013-09-03 | Discharge: 2013-09-03 | Disposition: A | Discharge status: Home / Self Care | Payer: BC Managed Care – PPO | Source: Ambulatory Visit | Attending: Internal Medicine | Admitting: Internal Medicine

## 2013-09-03 NOTE — Progress Notes (Signed)
Physical Therapy Treatment Patient Details  Name: Stephanie Norris MRN: [id number] Date of Birth: [birth year]  Today's Date: 09/03/2013 Time: 3500-9381 PT Time Calculation (min): 48 min Charge:  There ex [id number]; manual [id number] Visit#: 3 of 8  Re-eval: 09/25/13    Subjective: Symptoms/Limitations Symptoms: Pt states that she is feeling good today but yesterday was a bad day. Pain Assessment Currently in Pain?: Yes Pain Score: 2  Pain Location: Back Pain Orientation: Lower   Exercise/Treatments     Stretches Active Hamstring Stretch: 3 reps;30 seconds Single Knee to Chest Stretch: 3 reps;30 seconds Standing Side Bend: 5 reps;Limitations Standing Side Bend Limitations: Lt only Standing Extension:  (10) Prone on Elbows Stretch: 60 seconds Press Ups:  (10) Piriformis Stretch: 3 reps;30 seconds Standing Row: 10 reps;Theraband Theraband Level (Row): Level 3 (Green) Shoulder Extension: 10 reps;Theraband Theraband Level (Shoulder Extension): Level 3 (Green) Other Standing Lumbar Exercises: 3-D lumbar excursion  Seated Other Seated Lumbar Exercises: 3-D thoracic excursion Supine Ab Set: 10 reps Bridge: 10 reps Prone  Straight Leg Raise: 10 reps Other Prone Lumbar Exercises: rows/shoulder extension 10 x @ Other Prone Lumbar Exercises: w-back  Quadruped    Manual Therapy Joint Mobilization: Jt mobs for Lt rotation (grade II ato II on Rt facets in lumbar spine) mm tissue massage for paraspinal mm   Physical Therapy Assessment and Plan PT Assessment and Plan Clinical Impression Statement: Added T-band exercises as well as prone exercises to improve posture and mobility.  Pt  lumbar alignment is improved but continues to be slightly rotated to the right.  PT Plan: Begin standing with B UE flexion.     Goals  progressing   Problem List Patient Active Problem List   Diagnosis Date Noted  . Stiffness of joints, not elsewhere classified, multiple sites 08/26/2013  .  Flu-like symptoms 03/02/2013  . Lactic acidosis 03/02/2013  . Sinus tachycardia 03/02/2013  . Elevated lactic acid level 03/02/2013  . Influenza due to identified novel influenza A virus with other respiratory manifestations 03/02/2013  . Hyperglycemia 03/02/2013  . Fibromyalgia   . OSA (obstructive sleep apnea) 06/22/2011  . DOE (dyspnea on exertion) 05/30/2011  . Asthma 03/30/2011       GP    Khloi Rawl,CINDY 09/03/2013, 4:17 PM

## 2013-09-07 ENCOUNTER — Ambulatory Visit (HOSPITAL_COMMUNITY)
Admission: RE | Admit: 2013-09-07 | Discharge: 2013-09-07 | Disposition: A | Discharge status: Home / Self Care | Payer: BC Managed Care – PPO | Source: Ambulatory Visit | Attending: Rheumatology | Admitting: Rheumatology

## 2013-09-07 DIAGNOSIS — M539 Dorsopathy, unspecified: Secondary | ICD-10-CM | POA: Diagnosis not present

## 2013-09-07 DIAGNOSIS — M545 Low back pain, unspecified: Secondary | ICD-10-CM | POA: Diagnosis not present

## 2013-09-07 DIAGNOSIS — IMO0001 Reserved for inherently not codable concepts without codable children: Secondary | ICD-10-CM | POA: Insufficient documentation

## 2013-09-07 NOTE — Progress Notes (Signed)
Physical Therapy Treatment Patient Details  Name: Stephanie Norris MRN: [id number] Date of Birth: 10-Feb-1955  Today's Date: 09/07/2013 Time: 1200-1240 PT Time Calculation (min): 40 min Charge:  There ex 1200-1230; manual 1230-1240 Visit#: 4 of 8  Re-eval: 09/25/13    Subjective: Symptoms/Limitations Symptoms: Pt states that she is feeling good today Pain Assessment Currently in Pain?: No/denies   Exercise/Treatments   Standing Scapular Retraction: 15 reps;Theraband Theraband Level (Scapular Retraction): Level 3 (Green) Row: Theraband;15 reps Theraband Level (Row): Level 3 (Green) Shoulder Extension: Theraband;15 reps Theraband Level (Shoulder Extension): Level 3 (Green) Other Standing Lumbar Exercises: 3-D lumbar excursion  Other Standing Lumbar Exercises: Standing back to wall with B UE flexion keeping lumbar area stable; wall push ups x 10  Seated Other Seated Lumbar Exercises: 3-D thoracic excursion Other Seated Lumbar Exercises: piriformis stretch 60" B  Prone  Other Prone Lumbar Exercises: rows/shoulder extension 10 x @ Other Prone Lumbar Exercises: w-back     Manual Therapy Manual Therapy: Joint mobilization Joint Mobilization: myofascial release to lumbar paraspinal.  Noted improved alignment today; Grade II jt mobs for improved Lt rotation   Physical Therapy Assessment and Plan PT Assessment and Plan Clinical Impression Statement: Significant improvement with lumbar alignment.  Pt has improved posture awareness as well.   Began standing postural exercises.  All exercises were facilitated by therapist .  PT Plan: begin Arts development officer education.      Problem List Patient Active Problem List   Diagnosis Date Noted  . Stiffness of joints, not elsewhere classified, multiple sites 08/26/2013  . Flu-like symptoms 03/02/2013  . Lactic acidosis 03/02/2013  . Sinus tachycardia 03/02/2013  . Elevated lactic acid level 03/02/2013  . Influenza due to identified novel  influenza A virus with other respiratory manifestations 03/02/2013  . Hyperglycemia 03/02/2013  . Fibromyalgia   . OSA (obstructive sleep apnea) 06/22/2011  . DOE (dyspnea on exertion) 05/30/2011  . Asthma 03/30/2011       GP    RUSSELL,CINDY 09/07/2013, 4:43 PM

## 2013-09-15 ENCOUNTER — Ambulatory Visit (HOSPITAL_COMMUNITY)
Admission: RE | Admit: 2013-09-15 | Discharge: 2013-09-15 | Disposition: A | Discharge status: Home / Self Care | Payer: BC Managed Care – PPO | Source: Ambulatory Visit | Attending: Rheumatology | Admitting: Rheumatology

## 2013-09-15 DIAGNOSIS — IMO0001 Reserved for inherently not codable concepts without codable children: Secondary | ICD-10-CM | POA: Diagnosis not present

## 2013-09-15 NOTE — Progress Notes (Signed)
Physical Therapy Treatment Patient Details  Name: Stephanie Norris MRN: [id number] Date of Birth: 28-Dec-1955  Today's Date: 09/15/2013 Time: 0932-1010 PT Time Calculation (min): 38 min Visit#: 5 of 8  Re-eval: 09/25/13 Charges:  therex 38 minutes  Subjective:  PT states she is doing much better.  Currently without pain.    Exercise/Treatments Standing Lifting: From floor;5 reps Scapular Retraction: 15 reps;Theraband Theraband Level (Scapular Retraction): Level 3 (Green) Row: Theraband;15 reps Theraband Level (Row): Level 3 (Green) Shoulder Extension: Theraband;15 reps Theraband Level (Shoulder Extension): Level 3 (Green) Other Standing Lumbar Exercises: 3-D lumbar excursion  Other Standing Lumbar Exercises: Standing back to wall with B UE flexion keeping lumbar area stable; wall push ups x 10  Seated Other Seated Lumbar Exercises: 3-D thoracic excursion Other Seated Lumbar Exercises: piriformis stretch 60" B  Prone  Other Prone Lumbar Exercises: rows/shoulder extension 10 x @ Other Prone Lumbar Exercises: w-back      Physical Therapy Assessment and Plan PT Assessment:  Held manual techniques as patient symptom free today.  Focused on scapular strengthening and body mechanics education. Pt able to demonstrate with therapist facilitation for form.   PT Plan:  Give theraband and written instructions next visit.     Problem List Patient Active Problem List   Diagnosis Date Noted  . Stiffness of joints, not elsewhere classified, multiple sites 08/26/2013  . Flu-like symptoms 03/02/2013  . Lactic acidosis 03/02/2013  . Sinus tachycardia 03/02/2013  . Elevated lactic acid level 03/02/2013  . Influenza due to identified novel influenza A virus with other respiratory manifestations 03/02/2013  . Hyperglycemia 03/02/2013  . Fibromyalgia   . OSA (obstructive sleep apnea) 06/22/2011  . DOE (dyspnea on exertion) 05/30/2011  . Asthma 03/30/2011         Teena Irani,  PTA/CLT 09/15/2013, 12:44 PM

## 2013-09-17 ENCOUNTER — Ambulatory Visit (HOSPITAL_COMMUNITY)
Admission: RE | Admit: 2013-09-17 | Discharge: 2013-09-17 | Disposition: A | Discharge status: Home / Self Care | Payer: BC Managed Care – PPO | Source: Ambulatory Visit | Attending: Rheumatology | Admitting: Rheumatology

## 2013-09-17 DIAGNOSIS — IMO0001 Reserved for inherently not codable concepts without codable children: Secondary | ICD-10-CM | POA: Diagnosis not present

## 2013-09-17 NOTE — Progress Notes (Signed)
Physical Therapy Treatment Patient Details  Name: Stephanie Norris MRN: [id number] Date of Birth: 07-03-1955  Today's Date: 09/17/2013 Time:  8270- 1103    Visit#: 6 of 8  Re-eval:  10/05/2013    Subjective: Symptoms/Limitations Symptoms: Pt states that she is feeling good today yesterday she was folding laundry and began to have increased pain Pain Assessment Currently in Pain?: No/denies    Exercise/Treatments      Stretches Quad Stretch: 3 reps;30 seconds ITB Stretch: 3 reps;30 seconds    Standing Scapular Retraction: 15 reps;Theraband Theraband Level (Scapular Retraction): Level 3 (Green) Row: Theraband;15 reps Theraband Level (Row): Level 3 (Green) Shoulder Extension: Theraband;15 reps Theraband Level (Shoulder Extension): Level 3 (Green) Other Standing Lumbar Exercises: 3-D lumbar excursion using 2# wt Other Standing Lumbar Exercises: Standing back to wall with B UE flexion keeping lumbar area stable; wall push ups x 10 ; stretch up wall x 10 Seated Other Seated Lumbar Exercises: piriformis stretch 60" B  Prone  Opposite Arm/Leg Raise: 10 reps Other Prone Lumbar Exercises: rows/shoulder extension 10 x @ 3# Quadruped    Manual Therapy Joint Mobilization: myofascial release to lumbar paraspinal with jt mobs to align lumbar vertebrae.   Physical Therapy Assessment and Plan PT Assessment and Plan:  Pt progressing well with improved tolerance to normal ADL's.  Pt continues to have a slight posterior lean posture and was instructed to be consciously engaging her abdominal mm to correct.  Clinical Impression Statement: Pt given T band  Today for postural exercises therefore may discontinue doing these exercises in the department.       Problem List Patient Active Problem List   Diagnosis Date Noted  . Stiffness of joints, not elsewhere classified, multiple sites 08/26/2013  . Flu-like symptoms 03/02/2013  . Lactic acidosis 03/02/2013  . Sinus tachycardia  03/02/2013  . Elevated lactic acid level 03/02/2013  . Influenza due to identified novel influenza A virus with other respiratory manifestations 03/02/2013  . Hyperglycemia 03/02/2013  . Fibromyalgia   . OSA (obstructive sleep apnea) 06/22/2011  . DOE (dyspnea on exertion) 05/30/2011  . Asthma 03/30/2011       GP    [place] Pulse,CINDY 09/17/2013, 11:07 AM

## 2013-09-22 ENCOUNTER — Ambulatory Visit (HOSPITAL_COMMUNITY)
Admission: RE | Admit: 2013-09-22 | Discharge: 2013-09-22 | Disposition: A | Discharge status: Home / Self Care | Payer: BC Managed Care – PPO | Source: Ambulatory Visit | Attending: Internal Medicine | Admitting: Internal Medicine

## 2013-09-22 DIAGNOSIS — IMO0001 Reserved for inherently not codable concepts without codable children: Secondary | ICD-10-CM | POA: Diagnosis not present

## 2013-09-22 NOTE — Progress Notes (Signed)
Physical Therapy Treatment Patient Details  Name: Stephanie Norris MRN: [id number] Date of Birth: 05/02/1955  Today's Date: 09/22/2013 Time: 1020-1107 PT Time Calculation (min): 47 min Charge:  There ex 1020-1050; manual 1050-1100 Visit#: 7 of 8  Re-eval: 09/25/13   Subjective: Symptoms/Limitations Symptoms: Pt states that squatting to get items off the floor is easier; noted decreased pain in thoracic area as well as lumbar Pain Assessment Currently in Pain?: No/denies      Exercise/Treatments   Stretches Standing Side Bend: 10 seconds Prone on Elbows Stretch: 60 seconds Press Ups: 5 reps Aerobic Stationary Bike: nustep hills L3 x 10:00    Standing Forward Lunge: 10 reps Wall Slides: 10 reps Other Standing Lumbar Exercises: 3-D lumbar excursion using 2# wt Other Standing Lumbar Exercises: Standing back to wall with B UE flexion keeping lumbar area stable; wall push ups x 10 ; stretch up wall x 10 Seated Other Seated Lumbar Exercises: piriformis stretch 60" B  Prone  Opposite Arm/Leg Raise: 10 reps Other Prone Lumbar Exercises: rows/shoulder extension 10 x @ 3# Other Prone Lumbar Exercises: w back; double arm raise x 10 Quadruped [place]cat/Old Horse: 10 reps  Manual Therapy Joint Mobilization: myofascial release to lumbar/thoracic paraspinal mm to decrease tightness, no jt mobs needed this session   Physical Therapy Assessment and Plan PT Assessment and Plan Clinical Impression Statement: Pt posture is improved; added wall squats and lunges for functional LE strength PT Plan: reassess next visit       Problem List Patient Active Problem List   Diagnosis Date Noted  . Stiffness of joints, not elsewhere classified, multiple sites 08/26/2013  . Flu-like symptoms 03/02/2013  . Lactic acidosis 03/02/2013  . Sinus tachycardia 03/02/2013  . Elevated lactic acid level 03/02/2013  . Influenza due to identified novel influenza A virus with other respiratory  manifestations 03/02/2013  . Hyperglycemia 03/02/2013  . Fibromyalgia   . OSA (obstructive sleep apnea) 06/22/2011  . DOE (dyspnea on exertion) 05/30/2011  . Asthma 03/30/2011       GP    RUSSELL,CINDY 09/22/2013, 11:07 AM

## 2013-09-24 ENCOUNTER — Ambulatory Visit (HOSPITAL_COMMUNITY)
Admission: RE | Admit: 2013-09-24 | Discharge: 2013-09-24 | Disposition: A | Discharge status: Home / Self Care | Payer: BC Managed Care – PPO | Source: Ambulatory Visit | Attending: Rheumatology | Admitting: Rheumatology

## 2013-09-24 DIAGNOSIS — IMO0001 Reserved for inherently not codable concepts without codable children: Secondary | ICD-10-CM | POA: Diagnosis not present

## 2013-09-24 NOTE — Progress Notes (Signed)
Physical Therapy Re-evaluation / Discharge  Patient Details  Name: Stephanie Norris MRN: [id number] Date of Birth: [birth year]  Today's Date: 09/24/2013 Time: 1015-1055 PT Time Calculation (min): 40 min              Visit#: 8 of 8  Re-eval: 09/25/13 Assessment Diagnosis: LBP Next MD Visit: 11/05/2013 Prior Therapy: none Charges:  Mmt/rom testing, self care   Subjective Symptoms/Limitations Symptoms: Pt states she continues to have "some" pain but hasn't been bad. Varies 0-5/10 but mostly stays around 2-3/10. Pain Assessment Currently in Pain?: Yes Pain Score: 2  Pain Location: Back   Assessment RLE Strength Right Hip Flexion: 5/5 Right Hip Extension: 5/5 Right Hip ABduction: 5/5 Right Hip ADduction: 5/5 Right Knee Flexion: 5/5 Right Knee Extension: 5/5 Right Ankle Dorsiflexion: 5/5 LLE Strength Left Hip Flexion: 5/5 Left Hip Extension: 5/5 Left Hip ABduction: 5/5 Left Knee Flexion: 5/5 Left Knee Extension: 5/5 Left Ankle Dorsiflexion: 5/5 Lumbar AROM Lumbar Flexion: WNL Lumbar Extension: WNL Lumbar - Right Side Bend: WNL Lumbar - Left Side Bend: WNL Lumbar - Right Rotation: WNL Lumbar - Left Rotation: WNL  Exercise/Treatments Aerobic Stationary Bike: nustep hills L3 x 8:00    Physical Therapy Assessment and Plan PT Assessment and Plan Clinical Impression Statement: Pt has completed 8/8 sessions for treatment of back pain.  Pt has progressed well toward goals, however unable to meet ambulation goal, as patient reports she has no local place to walk for exercise.   Pt now with strength and ROM WNL.  Pt states her pain varies 0-5/10, however mostly stays around 2/10.  Pt encouraged to begin a walking program.  Pt verbalized understanding.   PT reports she plans to return to water therapy at the [facility] and feels she is ready for discharge to [facility].   PT Plan: Recommend discharge to [facility].  Patient requests discharge.    Goals Home Exercise Program Pt/caregiver will  Perform Home Exercise Program: For increased ROM PT Goal: Perform Home Exercise Program - Progress: Met PT Short Term Goals Time to Complete Short Term Goals: 2 weeks PT Short Term Goal 1: Pt pain to be no greater than a 5/10  PT Short Term Goal 1 - Progress: Met PT Short Term Goal 2: Pt to be walking at least 15 minutes a day for better health habits PT Short Term Goal 2 - Progress: Not met PT Short Term Goal 3: Pt to be able to stand for 15 minutes without increased pain to allow waiting in lines PT Short Term Goal 3 - Progress: Met PT Long Term Goals Time to Complete Long Term Goals: 4 weeks PT Long Term Goal 1: I in advance HE[facility] PT Long Term Goal 1 - Progress: Met PT Long Term Goal 2: Pt to be able to stand for 30 mintues to be able to make a meal without having to sit down PT Long Term Goal 2 - Progress: Met Long Term Goal 3: Pt to be walking 30 minutes a day for improved health habits.  Long Term Goal 3 Progress: Not met Long Term Goal 4: Pt pain to be no greater than a 3/10  Long Term Goal 4 Progress: Not met  Problem List Patient Active Problem List   Diagnosis Date Noted  . Stiffness of joints, not elsewhere classified, multiple sites 08/26/2013  . Flu-like symptoms 03/02/2013  . Lactic acidosis 03/02/2013  . Sinus tachycardia 03/02/2013  . Elevated lactic acid level 03/02/2013  . Influenza due to identified novel  influenza A virus with other respiratory manifestations 03/02/2013  . Hyperglycemia 03/02/2013  . Fibromyalgia   . OSA (obstructive sleep apnea) 06/22/2011  . DOE (dyspnea on exertion) 05/30/2011  . Asthma 03/30/2011    PT Plan of Care Consulted and Agree with Plan of Care: Patient   Teena Irani, PTA/CLT 09/24/2013, 11:06 AM

## 2013-10-17 ENCOUNTER — Encounter (HOSPITAL_COMMUNITY): Payer: Self-pay | Admitting: Emergency Medicine

## 2013-10-17 ENCOUNTER — Emergency Department (HOSPITAL_COMMUNITY)
Admission: EM | Admit: 2013-10-17 | Discharge: 2013-10-17 | Disposition: A | Discharge status: Home / Self Care | Payer: BC Managed Care – PPO | Source: Home / Self Care | Attending: Family Medicine | Admitting: Family Medicine

## 2013-10-17 DIAGNOSIS — J4521 Mild intermittent asthma with (acute) exacerbation: Secondary | ICD-10-CM

## 2013-10-17 DIAGNOSIS — J45901 Unspecified asthma with (acute) exacerbation: Secondary | ICD-10-CM

## 2013-10-17 MED ORDER — METHYLPREDNISOLONE ACETATE 80 MG/ML IJ SUSP
INTRAMUSCULAR | Status: AC
  Filled 2013-10-17: qty 1

## 2013-10-17 MED ORDER — METHYLPREDNISOLONE ACETATE 40 MG/ML IJ SUSP
80.0000 mg | Freq: Once | INTRAMUSCULAR | Status: AC
  Administered 2013-10-17: 80 mg via INTRAMUSCULAR

## 2013-10-17 MED ORDER — TRIAMCINOLONE ACETONIDE 40 MG/ML IJ SUSP
40.0000 mg | Freq: Once | INTRAMUSCULAR | Status: AC
Start: 2013-10-17 — End: 2013-10-17
  Administered 2013-10-17: 40 mg via INTRAMUSCULAR

## 2013-10-17 MED ORDER — TRIAMCINOLONE ACETONIDE 40 MG/ML IJ SUSP
INTRAMUSCULAR | Status: AC
  Filled 2013-10-17: qty 1

## 2013-10-17 MED ORDER — METHYLPREDNISOLONE 4 MG PO KIT: PACK | ORAL | Status: DC

## 2013-10-17 NOTE — ED Provider Notes (Signed)
CSN: [id number]     Arrival date & time 10/17/13  1727 History   First MD Initiated Contact with Patient 10/17/13 1744     Chief Complaint  Patient presents with  . Bronchitis   (Consider location/radiation/quality/duration/timing/severity/associated sxs/prior Treatment) Patient is a 58 y.o. female presenting with cough. The history is provided by the patient.  Cough Cough characteristics:  Dry and non-productive Severity:  Mild Onset quality:  Gradual Duration:  3 days Progression:  Unchanged Chronicity:  New Smoker: no   Associated symptoms: rhinorrhea   Associated symptoms: no chest pain, no fever, no shortness of breath, no sinus congestion and no wheezing   Associated symptoms comment:  Seen by lmd on thurs told bronchitis, was to put on z-pack and pred pack, h/o similar. Risk factors comment:  H/o asthma   Past Medical History  Diagnosis Date  . Hyperlipidemia   . Asthma   . OSA (obstructive sleep apnea)   . Fibromyalgia   . PCOS (polycystic ovarian syndrome)   . Diabetes mellitus without complication    Past Surgical History  Procedure Laterality Date  . Cholecystectomy    . Vesicovaginal fistula closure w/ tah    . Appendectomy    . Knee surgery  1996  . Colonoscopy w/ biopsies  fall 2009  . Combined hysterectomy vaginal / oophorectomy / a&p repair  10/2002    endometriosis, cystocele, fibroids   Family History  Problem Relation Age of Onset  . Allergies      "Everyone"  . Asthma Mother   . Rheum arthritis Mother   . Heart disease Maternal Grandfather   . Heart disease Maternal Grandmother   . Heart disease Paternal Grandfather   . Heart disease Paternal Grandmother   . Rheum arthritis Sister   . Heart failure Father   . Heart disease Father   . Rheum arthritis Sister    History  Substance Use Topics  . Smoking status: Never Smoker   . Smokeless tobacco: Never Used  . Alcohol Use: No   OB History   Grav Para Term Preterm Abortions TAB SAB Ect  Mult Living   1    1     0     Review of Systems  Constitutional: Negative.  Negative for fever.  HENT: Positive for rhinorrhea.   Respiratory: Positive for cough. Negative for chest tightness, shortness of breath and wheezing.   Cardiovascular: Negative for chest pain.    Allergies  Nsaids; Bactrim; Cephalexin; Levofloxacin; and Nitrofurantoin monohyd macro  Home Medications   Prior to Admission medications   Medication Sig Start Date End Date Taking? Authorizing Provider  cholecalciferol (VITAMIN D) 1000 UNITS tablet Take 1,000 Units by mouth daily.   Yes Historical Provider, MD  DULoxetine (CYMBALTA) 60 MG capsule Take 60 mg by mouth daily.   Yes Historical Provider, MD  folic acid (FOLVITE) 093 MCG tablet Take 800 mcg by mouth daily.   Yes Historical Provider, MD  metFORMIN (GLUCOPHAGE) 500 MG tablet Take 500 mg by mouth daily with breakfast.   Yes Historical Provider, MD  methotrexate (RHEUMATREX) 2.5 MG tablet Take 17.5 mg by mouth once a week. Caution:Chemotherapy. Protect from light.   Yes Historical Provider, MD  mirabegron ER (MYRBETRIQ) 50 MG TB24 tablet Take 50 mg by mouth daily.   Yes Historical Provider, MD  mometasone-formoterol (DULERA) 100-5 MCG/ACT AERO Inhale 2 puffs into the lungs 2 (two) times daily. 09/03/11  Yes Kathee Delton, MD  Omega-3 Fatty Acids (FISH OIL) 1000  MG CAPS Take 1 capsule by mouth daily.   Yes Historical Provider, MD  estradiol (ESTRING) 2 MG vaginal ring Place 2 mg vaginally every 3 (three) months. follow package directions 03/23/13   Milford Cage, FNP  fluticasone (FLOVENT HFA) 110 MCG/ACT inhaler Inhale 1 puff into the lungs daily.    Historical Provider, MD  guaiFENesin-dextromethorphan (ROBITUSSIN DM) 100-10 MG/5ML syrup Take 5 mLs by mouth every 4 (four) hours as needed for cough. 03/03/13   Radene Gunning, NP  ipratropium (ATROVENT) 0.06 % nasal spray Place 2 sprays into both nostrils daily.    Historical Provider, MD  loratadine  (CLARITIN) 10 MG tablet Take 10 mg by mouth daily.    Historical Provider, MD  methylPREDNISolone (MEDROL DOSEPAK) 4 MG tablet follow package directions, start on sun, take until finished. 10/17/13   Billy Fischer, MD  montelukast (SINGULAIR) 10 MG tablet Take 10 mg by mouth at bedtime.    Historical Provider, MD  omeprazole-sodium bicarbonate (ZEGERID) 40-1100 MG per capsule Take 1 capsule by mouth at bedtime.    Historical Provider, MD   BP 136/83  Pulse 77  Temp(Src) 98.1 F (36.7 C) (Oral)  Resp 16  SpO2 100% Physical Exam  Nursing note and vitals reviewed. Constitutional: She is oriented to person, place, and time. She appears well-developed and well-nourished. No distress.  Neck: Normal range of motion. Neck supple.  Cardiovascular: Regular rhythm, normal heart sounds and intact distal pulses.   Pulmonary/Chest: Effort normal and breath sounds normal. She has no wheezes.  Lymphadenopathy:    She has no cervical adenopathy.  Neurological: She is alert and oriented to person, place, and time.  Skin: Skin is warm and dry.    ED Course  Procedures (including critical care time) Labs Review Labs Reviewed - No data to display  Imaging Review No results found.   MDM   1. Bronchitis, allergic, mild intermittent, with acute exacerbation        Billy Fischer, MD 10/17/13 [phone]

## 2013-10-17 NOTE — ED Notes (Signed)
Seen doc on Thursday.  Dx with bronchitis.  Was unable to get meds filled office did not send them over to pharmacy.  Pt's doc office was closed and no on call.

## 2013-10-17 NOTE — Discharge Instructions (Signed)
Use nasal spray  And drink plenty of fluids, take medicine until finished. See your doctor if further problems.

## 2013-11-05 ENCOUNTER — Encounter: Payer: Self-pay | Admitting: Pulmonary Disease

## 2013-11-05 ENCOUNTER — Ambulatory Visit (INDEPENDENT_AMBULATORY_CARE_PROVIDER_SITE_OTHER): Payer: BC Managed Care – PPO | Admitting: Pulmonary Disease

## 2013-11-05 VITALS — BP 116/66 | HR 90 | Temp 98.4°F | Ht 59.25 in | Wt 143.4 lb

## 2013-11-05 DIAGNOSIS — J4520 Mild intermittent asthma, uncomplicated: Secondary | ICD-10-CM

## 2013-11-05 DIAGNOSIS — G4733 Obstructive sleep apnea (adult) (pediatric): Secondary | ICD-10-CM

## 2013-11-05 NOTE — Patient Instructions (Signed)
No change in asthma medications. Stay on cpap, and keep up with supplies. followup with me again in 75mos

## 2013-11-05 NOTE — Assessment & Plan Note (Signed)
The patient was doing very well from an asthma standpoint until her recent episode of acute asthmatic bronchitis. She has been treated with antibiotics and prednisone, and is returning to baseline slowly. She has no crackles or wheezes on exam today. I've asked her to continue on her maintenance bronchodilator regimen.

## 2013-11-05 NOTE — Progress Notes (Signed)
   Subjective:    Patient ID: Stephanie Norris, female    DOB: 03-Oct-1955, 58 y.o.   MRN: [id number]  HPI Patient comes in today for followup of her known asthma. She also is being followed for obstructive sleep apnea, and is doing well with CPAP by her download. She has had a recent episode of acute bronchitis, and is being treated with an antibiotic and steroids by her primary physician. She feels that she is much improved, but not quite back to baseline.   Review of Systems  Constitutional: Negative for fever and unexpected weight change.  HENT: Positive for congestion. Negative for dental problem, ear pain, nosebleeds, postnasal drip, rhinorrhea, sinus pressure, sneezing, sore throat and trouble swallowing.   Eyes: Negative for redness and itching.  Respiratory: Positive for cough. Negative for chest tightness, shortness of breath and wheezing.   Cardiovascular: Negative for palpitations and leg swelling.  Gastrointestinal: Negative for nausea and vomiting.  Genitourinary: Negative for dysuria.  Musculoskeletal: Negative for joint swelling.  Skin: Negative for rash.  Neurological: Negative for headaches.  Hematological: Does not bruise/bleed easily.  Psychiatric/Behavioral: Negative for dysphoric mood. The patient is not nervous/anxious.        Objective:   Physical Exam Overweight female in no acute distress Nose without purulence or discharge noted Neck without lymphadenopathy or thyromegaly No skin breakdown or pressure necrosis from the CPAP mask Chest totally clear to auscultation, no wheezes or crackles Cardiac exam with regular rate and rhythm Lower extremities with minimal edema, no cyanosis Alert and oriented, moves all 4 extremities.       Assessment & Plan:

## 2013-11-09 ENCOUNTER — Encounter: Payer: BC Managed Care – PPO | Attending: Endocrinology

## 2013-12-07 ENCOUNTER — Encounter: Payer: Self-pay | Admitting: Pulmonary Disease

## 2014-02-27 ENCOUNTER — Other Ambulatory Visit: Payer: Self-pay | Admitting: Nurse Practitioner

## 2014-03-01 NOTE — Telephone Encounter (Signed)
Medication refill request: Estring 2 mg Last AEX:  03/23/13 Next AEX: 03/25/14 Last MMG (if hormonal medication request): 03/18/13 [id number]:Neg Refill authorized: 03/23/13 #1/3R. Today #1/0R?

## 2014-03-25 ENCOUNTER — Ambulatory Visit: Payer: BC Managed Care – PPO | Admitting: Nurse Practitioner

## 2014-03-29 ENCOUNTER — Ambulatory Visit: Payer: Self-pay | Admitting: Nurse Practitioner

## 2014-05-07 ENCOUNTER — Encounter: Payer: Self-pay | Admitting: Pulmonary Disease

## 2014-05-07 ENCOUNTER — Ambulatory Visit (INDEPENDENT_AMBULATORY_CARE_PROVIDER_SITE_OTHER): Payer: Commercial Managed Care - HMO | Admitting: Pulmonary Disease

## 2014-05-07 VITALS — BP 110/64 | HR 80 | Temp 98.1°F | Ht 59.5 in | Wt 146.4 lb

## 2014-05-07 DIAGNOSIS — G4733 Obstructive sleep apnea (adult) (pediatric): Secondary | ICD-10-CM | POA: Diagnosis not present

## 2014-05-07 DIAGNOSIS — J4520 Mild intermittent asthma, uncomplicated: Secondary | ICD-10-CM | POA: Diagnosis not present

## 2014-05-07 NOTE — Patient Instructions (Signed)
Check and see what alternatives your insurance will cover other than dulera:  advair 250, symbicort 160, breo 100 Stay on cpap, and keep up with mask changes and supplies. Let us know if your breathing does not return to baseline. followup with me again in 20mos.

## 2014-05-07 NOTE — Assessment & Plan Note (Signed)
The patient is doing extremely well with her C Pap, and her download shows excellent compliance and good control of her AHI. Vascular continue on this, and to work aggressively on weight loss.

## 2014-05-07 NOTE — Progress Notes (Signed)
   Subjective:    Patient ID: Stephanie Norris, female    DOB: 1955-04-27, 59 y.o.   MRN: [id number]  HPI The patient comes in today for follow-up of her known asthma and obstructive sleep apnea. He is wearing C Pap compliantly by her download, and feels that she is doing well with the device. She had been doing well from an asthma standpoint until recently, when she had an episode of acute asthmatic bronchitis. She has been treated with a course of antibiotics and prednisone, but has not quite returned to her usual baseline. He is not overly congested currently, nor is she bringing up purulence.   Review of Systems  Constitutional: Negative for fever and unexpected weight change.  HENT: Negative for congestion, dental problem, ear pain, nosebleeds, postnasal drip, rhinorrhea, sinus pressure, sneezing, sore throat and trouble swallowing.   Eyes: Negative for redness and itching.  Respiratory: Negative for cough, chest tightness, shortness of breath and wheezing.   Cardiovascular: Negative for palpitations and leg swelling.  Gastrointestinal: Negative for nausea and vomiting.  Genitourinary: Negative for dysuria.  Musculoskeletal: Negative for joint swelling.  Skin: Negative for rash.  Neurological: Negative for headaches.  Hematological: Does not bruise/bleed easily.  Psychiatric/Behavioral: Negative for dysphoric mood. The patient is not nervous/anxious.        Objective:   Physical Exam Overweight female in no acute distress Nose without purulence or discharge noted No skin breakdown or pressure necrosis from the C Pap mask Neck without lymphadenopathy or thyromegaly Chest with good airflow bilaterally, no wheezing Cardiac exam with regular rate and rhythm Lower extremities without edema, no cyanosis Alert and oriented, moves all 4 extremities.       Assessment & Plan:

## 2014-05-07 NOTE — Assessment & Plan Note (Signed)
The patient had been doing well her current regimen, but is just getting over an episode of acute asthmatic bronchitis. He has not quite gotten back to baseline, and I've asked her to call me if she does not and we can look at treating with another round of prednisone. In the meantime, she is to continue on her maintenance medication, and I've given her a list to see which is better covered on her new insurance plan.

## 2014-05-31 ENCOUNTER — Ambulatory Visit: Payer: Self-pay | Admitting: Nurse Practitioner

## 2014-06-18 ENCOUNTER — Encounter: Payer: Self-pay | Admitting: Obstetrics & Gynecology

## 2014-06-18 ENCOUNTER — Ambulatory Visit (INDEPENDENT_AMBULATORY_CARE_PROVIDER_SITE_OTHER): Payer: Commercial Managed Care - HMO | Admitting: Obstetrics & Gynecology

## 2014-06-18 VITALS — BP 110/80 | HR 76 | Ht 59.2 in | Wt 143.0 lb

## 2014-06-18 DIAGNOSIS — Z1212 Encounter for screening for malignant neoplasm of rectum: Secondary | ICD-10-CM

## 2014-06-18 DIAGNOSIS — Z01419 Encounter for gynecological examination (general) (routine) without abnormal findings: Secondary | ICD-10-CM

## 2014-06-18 DIAGNOSIS — Z1211 Encounter for screening for malignant neoplasm of colon: Secondary | ICD-10-CM

## 2014-06-18 MED ORDER — ESTRADIOL 0.1 MG/GM VA CREA: 1.0000 | TOPICAL_CREAM | Freq: Every day | VAGINAL | Status: DC

## 2014-06-18 NOTE — Progress Notes (Signed)
Patient ID: Stephanie Norris, female   DOB: 08/15/1955, 59 y.o.   MRN: [id number] Subjective:     Stephanie Norris is a 59 y.o. female here for a routine exam.  No LMP recorded. Patient is postmenopausal. G1P0010 Birth Control Method:  na Menstrual Calendar(currently): na  Current complaints: na.   Current acute medical issues:  fibromyalgia   Recent Gynecologic History No LMP recorded. Patient is postmenopausal. Last Pap: na,   Last mammogram: 2014,  normal  Past Medical History  Diagnosis Date  . Hyperlipidemia   . Asthma   . OSA (obstructive sleep apnea)   . Fibromyalgia   . PCOS (polycystic ovarian syndrome)   . Diabetes mellitus without complication     Past Surgical History  Procedure Laterality Date  . Cholecystectomy    . Vesicovaginal fistula closure w/ tah    . Appendectomy    . Knee surgery  1996  . Colonoscopy w/ biopsies  fall 2009  . Combined hysterectomy vaginal / oophorectomy / a&p repair  10/2002    endometriosis, cystocele, fibroids    OB History    Gravida Para Term Preterm AB TAB SAB Ectopic Multiple Living   1    1     0      History   Social History  . Marital Status: Married    Spouse Name: N/A  . Number of Children: 0  . Years of Education: N/A   Occupational History  . Patient [employer] History Main Topics  . Smoking status: Never Smoker   . Smokeless tobacco: Never Used  . Alcohol Use: No  . Drug Use: No  . Sexual Activity: Not on file   Other Topics Concern  . None   Social History Narrative    Family History  Problem Relation Age of Onset  . Allergies      "Everyone"  . Asthma Mother   . Rheum arthritis Mother   . Heart disease Maternal Grandfather   . Heart disease Maternal Grandmother   . Heart disease Paternal Grandfather   . Heart disease Paternal Grandmother   . Rheum arthritis Sister   . Heart failure Father   . Heart disease Father   . Rheum arthritis Sister      Current  outpatient prescriptions:  .  Alpha-Lipoic Acid 200 MG CAPS, Take by mouth daily., Disp: , Rfl:  .  cholecalciferol (VITAMIN D) 1000 UNITS tablet, Take 1,000 Units by mouth daily., Disp: , Rfl:  .  diclofenac sodium (VOLTAREN) 1 % GEL, Apply topically as needed., Disp: , Rfl:  .  DULoxetine (CYMBALTA) 60 MG capsule, Take 60 mg by mouth daily., Disp: , Rfl:  .  fenofibrate (TRICOR) 145 MG tablet, Take 145 mg by mouth daily., Disp: , Rfl:  .  folic acid (FOLVITE) 144 MCG tablet, Take 800 mcg by mouth daily., Disp: , Rfl:  .  ipratropium (ATROVENT) 0.06 % nasal spray, Place 2 sprays into both nostrils daily., Disp: , Rfl:  .  loratadine (CLARITIN) 10 MG tablet, Take 10 mg by mouth daily., Disp: , Rfl:  .  metFORMIN (GLUCOPHAGE) 500 MG tablet, Take 500 mg by mouth daily with breakfast., Disp: , Rfl:  .  methotrexate (RHEUMATREX) 2.5 MG tablet, Take 17.5 mg by mouth once a week. Caution:Chemotherapy. Protect from light., Disp: , Rfl:  .  mometasone-formoterol (DULERA) 100-5 MCG/ACT AERO, Inhale 2 puffs into the lungs 2 (two) times daily., Disp: 1 Inhaler, Rfl:  5 .  montelukast (SINGULAIR) 10 MG tablet, Take 10 mg by mouth at bedtime., Disp: , Rfl:  .  Omega-3 Fatty Acids (FISH OIL) 1000 MG CAPS, Take 1 capsule by mouth daily., Disp: , Rfl:  .  Dexlansoprazole (DEXILANT PO), Take by mouth daily., Disp: , Rfl:  .  estradiol (ESTRACE VAGINAL) 0.1 MG/GM vaginal cream, Place 1 Applicatorful vaginally at bedtime., Disp: 42.5 g, Rfl: 12 .  guaiFENesin-dextromethorphan (ROBITUSSIN DM) 100-10 MG/5ML syrup, Take 5 mLs by mouth every 4 (four) hours as needed for cough. (Patient not taking: Reported on 06/18/2014), Disp: 118 mL, Rfl: 0  Review of Systems  Review of Systems  Constitutional: Negative for fever, chills, weight loss, malaise/fatigue and diaphoresis.  HENT: Negative for hearing loss, ear pain, nosebleeds, congestion, sore throat, neck pain, tinnitus and ear discharge.   Eyes: Negative for blurred  vision, double vision, photophobia, pain, discharge and redness.  Respiratory: Negative for cough, hemoptysis, sputum production, shortness of breath, wheezing and stridor.   Cardiovascular: Negative for chest pain, palpitations, orthopnea, claudication, leg swelling and PND.  Gastrointestinal: negative for abdominal pain. Negative for heartburn, nausea, vomiting, diarrhea, constipation, blood in stool and melena.  Genitourinary: Negative for dysuria, urgency, frequency, hematuria and flank pain.  Musculoskeletal: Negative for myalgias, back pain, joint pain and falls.  Skin: Negative for itching and rash.  Neurological: Negative for dizziness, tingling, tremors, sensory change, speech change, focal weakness, seizures, loss of consciousness, weakness and headaches.  Endo/Heme/Allergies: Negative for environmental allergies and polydipsia. Does not bruise/bleed easily.  Psychiatric/Behavioral: Negative for depression, suicidal ideas, hallucinations, memory loss and substance abuse. The patient is not nervous/anxious and does not have insomnia.        Objective:  Blood pressure 110/80, pulse 76, height 4' 11.2" (1.504 m), weight 143 lb (64.864 kg).   Physical Exam  Vitals reviewed. Constitutional: She is oriented to person, place, and time. She appears well-developed and well-nourished.  HENT:  Head: Normocephalic and atraumatic.        Right Ear: External ear normal.  Left Ear: External ear normal.  Nose: Nose normal.  Mouth/Throat: Oropharynx is clear and moist.  Eyes: Conjunctivae and EOM are normal. Pupils are equal, round, and reactive to light. Right eye exhibits no discharge. Left eye exhibits no discharge. No scleral icterus.  Neck: Normal range of motion. Neck supple. No tracheal deviation present. No thyromegaly present.  Cardiovascular: Normal rate, regular rhythm, normal heart sounds and intact distal pulses.  Exam reveals no gallop and no friction rub.   No murmur  heard. Respiratory: Effort normal and breath sounds normal. No respiratory distress. She has no wheezes. She has no rales. She exhibits no tenderness.  GI: Soft. Bowel sounds are normal. She exhibits no distension and no mass. There is no tenderness. There is no rebound and no guarding.  Genitourinary:  Breasts no masses skin changes or nipple changes bilaterally      Vulva is normal without lesions Vagina is pink moist without discharge Cervix absent Uterus is absent Adnexa is negative  {Rectal    hemoccult negative, normal tone, no masses Musculoskeletal: Normal range of motion. She exhibits no edema and no tenderness.  Neurological: She is alert and oriented to person, place, and time. She has normal reflexes. She displays normal reflexes. No cranial nerve deficit. She exhibits normal muscle tone. Coordination normal.  Skin: Skin is warm and dry. No rash noted. No erythema. No pallor.  Psychiatric: She has a normal mood and affect. Her behavior  is normal. Judgment and thought content normal.       Assessment:    Healthy female exam.    Plan:    Mammogram ordered. Follow up in: 1 year.

## 2014-06-21 ENCOUNTER — Telehealth: Payer: Self-pay | Admitting: Obstetrics & Gynecology

## 2014-06-21 MED ORDER — ESTROGENS, CONJUGATED 0.625 MG/GM VA CREA: 1.0000 | TOPICAL_CREAM | Freq: Every day | VAGINAL | Status: DC

## 2014-06-21 NOTE — Telephone Encounter (Signed)
Pt informed premarin e-scribed to pharmacy.

## 2014-07-23 ENCOUNTER — Telehealth: Payer: Self-pay | Admitting: Pulmonary Disease

## 2014-07-23 MED ORDER — FLUTICASONE FUROATE-VILANTEROL 100-25 MCG/INH IN AEPB: 1.0000 | INHALATION_SPRAY | Freq: Every day | RESPIRATORY_TRACT | Status: DC

## 2014-07-23 NOTE — Telephone Encounter (Signed)
Patient says that insurance will not cover [facility] Patient says insurance will cover [place] [facility]  Per KC's last OV note, he requested patient check with insurance to determine which medications they would cover.  Ok to sent Breo to pharmacy.  MW - please advise.

## 2014-07-23 NOTE — Telephone Encounter (Signed)
It requires teaching so ok to change to [facility] but will need ov w/in 2 weeks to see me or Tammy NP to be sure she's using it correctly and doing just as well

## 2014-07-23 NOTE — Telephone Encounter (Signed)
Rx sent to pharmacy. Discussed correct way to use Breo over telephone and advised patient to take Memory Dance out of the box at pharmacy and have pharmacy show her how to use if she does not understand. Patient says that she understands how to use it.  Scheduled to see TP 7/8. Nothing further needed.

## 2014-08-13 ENCOUNTER — Encounter: Payer: Self-pay | Admitting: Adult Health

## 2014-08-13 ENCOUNTER — Ambulatory Visit (INDEPENDENT_AMBULATORY_CARE_PROVIDER_SITE_OTHER): Payer: Commercial Managed Care - HMO | Admitting: Adult Health

## 2014-08-13 VITALS — BP 124/86 | HR 90 | Temp 98.0°F | Ht 60.0 in | Wt 144.0 lb

## 2014-08-13 DIAGNOSIS — J4530 Mild persistent asthma, uncomplicated: Secondary | ICD-10-CM | POA: Diagnosis not present

## 2014-08-13 DIAGNOSIS — J309 Allergic rhinitis, unspecified: Secondary | ICD-10-CM | POA: Diagnosis not present

## 2014-08-13 DIAGNOSIS — J3089 Other allergic rhinitis: Secondary | ICD-10-CM | POA: Insufficient documentation

## 2014-08-13 DIAGNOSIS — G4733 Obstructive sleep apnea (adult) (pediatric): Secondary | ICD-10-CM | POA: Diagnosis not present

## 2014-08-13 MED ORDER — AZELASTINE HCL 0.1 % NA SOLN: 2.0000 | Freq: Two times a day (BID) | NASAL | Status: DC

## 2014-08-13 NOTE — Assessment & Plan Note (Signed)
Astelin trial

## 2014-08-13 NOTE — Assessment & Plan Note (Signed)
Well controlled  Plan Continue on nocturnal C Pap.

## 2014-08-13 NOTE — Assessment & Plan Note (Signed)
Controlled on current regimen.   

## 2014-08-13 NOTE — Progress Notes (Signed)
   Subjective:    Patient ID: ARIAUNA FARABEE, female    DOB: 11-17-1955, 59 y.o.   MRN: [id number]  HPI 59 year old female with known asthma and obstructive sleep apnea  08/13/2014 three-month follow-up: Asthma, OSA Patient returns for three-month follow-up Patient says overall she has been doing well She says her asthma has been well controlled on BREO .  She denies any flare of cough or wheezing.  Patient wears C Pap at bedtime Says, that she's been doing well with her machine and mask. Denies. daytime sleepiness Download shows excellent compliance Average usage 8 hours. AHI 1.0. Minimal leaks   She denies any chest pain, orthopnea, PND or leg swelling. Does have daily nasal drainage, and uses Atrovent. However, her insurance will no longer cover this would like a alternative.     Review of Systems Constitutional:   No  weight loss, night sweats,  Fevers, chills, fatigue, or  lassitude.  HEENT:   No headaches,  Difficulty swallowing,  Tooth/dental problems, or  Sore throat,                No sneezing, itching, ear ache, nasal congestion, post nasal drip+  CV:  No chest pain,  Orthopnea, PND, swelling in lower extremities, anasarca, dizziness, palpitations, syncope.   GI  No heartburn, indigestion, abdominal pain, nausea, vomiting, diarrhea, change in bowel habits, loss of appetite, bloody stools.   Resp: No shortness of breath with exertion or at rest.  No excess mucus, no productive cough,  No non-productive cough,  No coughing up of blood.  No change in color of mucus.  No wheezing.  No chest wall deformity  Skin: no rash or lesions.  GU: no dysuria, change in color of urine, no urgency or frequency.  No flank pain, no hematuria   MS:  No joint pain or swelling.  No decreased range of motion.  No back pain.  Psych:  No change in mood or affect. No depression or anxiety.  No memory loss.         Objective:   Physical Exam GEN: A/Ox3; pleasant , NAD, well  nourished   HEENT:  [place]/AT,  EACs-clear, TMs-wnl, NOSE-clear drainage  THROAT-clear, no lesions, no postnasal drip or exudate noted.   NECK:  Supple w/ fair ROM; no JVD; normal carotid impulses w/o bruits; no thyromegaly or nodules palpated; no lymphadenopathy.  RESP  Clear  P & A; w/o, wheezes/ rales/ or rhonchi.no accessory muscle use, no dullness to percussion  CARD:  RRR, no m/r/g  , no peripheral edema, pulses intact, no cyanosis or clubbing.  GI:   Soft & nt; nml bowel sounds; no organomegaly or masses detected.  Musco: Warm bil, no deformities or joint swelling noted.   Neuro: alert, no focal deficits noted.    Skin: Warm, no lesions or rashes         Assessment & Plan:

## 2014-08-13 NOTE — Patient Instructions (Signed)
Continue on BREO daily .  Wear CPAP At bedtime   Keep up good work.  Can change Atrovent nasal to Astelin Nasal 2 puffs Twice daily  As needed  Drippy nose.  follow up Dr. Halford Chessman  In 6 months and As needed

## 2014-08-15 NOTE — Progress Notes (Signed)
Reviewed and agree with assessment/plan. 

## 2014-09-27 ENCOUNTER — Encounter: Payer: Self-pay | Admitting: Pulmonary Disease

## 2015-02-04 ENCOUNTER — Telehealth: Payer: Self-pay | Admitting: Pulmonary Disease

## 2015-02-04 DIAGNOSIS — G4733 Obstructive sleep apnea (adult) (pediatric): Secondary | ICD-10-CM

## 2015-02-04 NOTE — Telephone Encounter (Signed)
[place] [facility] calling stating that they received or for new cpap mask on this pt she has [employer] and they don't have a contract with them therefor they are unable to order.Hillery Hunter

## 2015-02-04 NOTE — Telephone Encounter (Signed)
Okay to sent order.

## 2015-02-04 NOTE — Telephone Encounter (Signed)
LMTCB for pt to inform her of below

## 2015-02-04 NOTE — Telephone Encounter (Signed)
LMTCB x1 Order placed

## 2015-02-04 NOTE — Telephone Encounter (Signed)
Last ov with TP on 08/13/14 Next ov with VS on 02/22/15  Patient Instructions       Continue on BREO daily .   Wear CPAP At bedtime    Keep up good work.   Can change Atrovent nasal to Astelin Nasal 2 puffs Twice daily  As needed  Drippy nose.   follow up Dr. Halford Chessman  In 6 months and As needed         Called and spoke with pt. Pt c/o cpap mask leaking during the night. Pt is currently using a full face mask and is requesting a order be placed to [place] for a replacement mask. I explained to her that TP is out of the office until 02/08/15 but would send message to VS. She voiced understanding and had no further questions. Pt voiced understanding and had no further questions.  VS please advise if okay to place order

## 2015-02-08 NOTE — Telephone Encounter (Signed)
Called and spoke with patient, she no longer uses [employer].  She has [employer] now as her insurance company.  She said that she has already spoken with [employer] and all they need to process her order is a copy of the Sleep Study.   Sleep Study faxed to [facility].   Nothing further needed.

## 2015-02-09 DIAGNOSIS — G4733 Obstructive sleep apnea (adult) (pediatric): Secondary | ICD-10-CM | POA: Diagnosis not present

## 2015-02-10 ENCOUNTER — Emergency Department (HOSPITAL_COMMUNITY)
Admission: EM | Admit: 2015-02-10 | Discharge: 2015-02-10 | Disposition: A | Discharge status: Home / Self Care | Payer: PPO | Source: Home / Self Care | Attending: Family Medicine | Admitting: Family Medicine

## 2015-02-10 ENCOUNTER — Encounter (HOSPITAL_COMMUNITY): Payer: Self-pay

## 2015-02-10 DIAGNOSIS — J209 Acute bronchitis, unspecified: Secondary | ICD-10-CM | POA: Diagnosis not present

## 2015-02-10 MED ORDER — METHYLPREDNISOLONE SODIUM SUCC 125 MG IJ SOLR
125.0000 mg | Freq: Once | INTRAMUSCULAR | Status: AC
  Administered 2015-02-10: 125 mg via INTRAMUSCULAR

## 2015-02-10 MED ORDER — PREDNISONE 50 MG PO TABS: ORAL_TABLET | ORAL | Status: DC

## 2015-02-10 MED ORDER — METHYLPREDNISOLONE SODIUM SUCC 125 MG IJ SOLR
INTRAMUSCULAR | Status: AC
  Filled 2015-02-10: qty 2

## 2015-02-10 MED ORDER — AZITHROMYCIN 250 MG PO TABS: ORAL_TABLET | ORAL | Status: DC

## 2015-02-10 NOTE — ED Notes (Signed)
Patient here for cough and chest congestion that started about two days ago Patient did state she has a history of bronchitis

## 2015-02-10 NOTE — Discharge Instructions (Signed)

## 2015-02-10 NOTE — ED Provider Notes (Addendum)
CSN: [id number]     Arrival date & time 02/10/15  1643 History   First MD Initiated Contact with Patient 02/10/15 1809     Chief Complaint  Patient presents with  . Cough   (Consider location/radiation/quality/duration/timing/severity/associated sxs/prior Treatment) Patient is a 60 y.o. female presenting with cough. The history is provided by the patient. No language interpreter was used.  Cough Cough characteristics:  Dry, non-productive and hacking Severity:  Moderate Onset quality:  Sudden Duration:  3 days Timing:  Constant Progression:  Worsening Chronicity:  Recurrent Smoker: no   Context: upper respiratory infection and weather changes   Relieved by:  Beta-agonist inhaler Worsened by:  Nothing tried Ineffective treatments:  Beta-agonist inhaler Associated symptoms: shortness of breath, sinus congestion and wheezing   Associated symptoms: no chest pain, no chills, no diaphoresis, no ear pain and no fever     Past Medical History  Diagnosis Date  . Hyperlipidemia   . Asthma   . OSA (obstructive sleep apnea)   . Fibromyalgia   . PCOS (polycystic ovarian syndrome)   . Diabetes mellitus without complication [facility])    Past Surgical History  Procedure Laterality Date  . Cholecystectomy    . Vesicovaginal fistula closure w/ tah    . Appendectomy    . Knee surgery  1996  . Colonoscopy w/ biopsies  fall 2009  . Combined hysterectomy vaginal / oophorectomy / a&p repair  10/2002    endometriosis, cystocele, fibroids   Family History  Problem Relation Age of Onset  . Allergies      "Everyone"  . Asthma Mother   . Rheum arthritis Mother   . Heart disease Maternal Grandfather   . Heart disease Maternal Grandmother   . Heart disease Paternal Grandfather   . Heart disease Paternal Grandmother   . Rheum arthritis Sister   . Heart failure Father   . Heart disease Father   . Rheum arthritis Sister    Social History  Substance Use Topics  . Smoking status: Never Smoker    . Smokeless tobacco: Never Used  . Alcohol Use: No   OB History    Gravida Para Term Preterm AB TAB SAB Ectopic Multiple Living   1    1     0     Review of Systems  Constitutional: Positive for activity change and appetite change. Negative for fever, chills and diaphoresis.  HENT: Negative.  Negative for ear pain.   Eyes: Negative.   Respiratory: Positive for cough, shortness of breath and wheezing.   Cardiovascular: Negative.  Negative for chest pain.  Gastrointestinal: Negative.     Allergies  Nsaids; Bactrim; Cephalexin; Levofloxacin; and Nitrofurantoin monohyd macro  Home Medications   Prior to Admission medications   Medication Sig Start Date End Date Taking? Authorizing Provider  Alpha-Lipoic Acid 200 MG CAPS Take by mouth daily.    Historical Provider, MD  azelastine (ASTELIN) 0.1 % nasal spray Place 2 sprays into both nostrils 2 (two) times daily. Use in each nostril as directed 08/13/14   Tammy S Parrett, NP  azithromycin (ZITHROMAX) 250 MG tablet Take first 2 tablets together, then 1 every day until finished. 02/10/15   Konrad Felix, PA  cholecalciferol (VITAMIN D) 1000 UNITS tablet Take 1,000 Units by mouth daily.    Historical Provider, MD  conjugated estrogens (PREMARIN) vaginal cream Place 1 Applicatorful vaginally daily. 06/21/14   Florian Buff, MD  diclofenac sodium (VOLTAREN) 1 % GEL Apply topically as needed.  Historical Provider, MD  DULoxetine (CYMBALTA) 60 MG capsule Take 60 mg by mouth daily.    Historical Provider, MD  estradiol (ESTRACE VAGINAL) 0.1 MG/GM vaginal cream Place 1 Applicatorful vaginally at bedtime. 06/18/14   Florian Buff, MD  Fluticasone Furoate-Vilanterol 100-25 MCG/INH AEPB Inhale 1 puff into the lungs daily. 07/23/14   Tanda Rockers, MD  folic acid (FOLVITE) Q000111Q MCG tablet Take 800 mcg by mouth daily.    Historical Provider, MD  guaiFENesin-dextromethorphan (ROBITUSSIN DM) 100-10 MG/5ML syrup Take 5 mLs by mouth every 4 (four) hours as  needed for cough. 03/03/13   Radene Gunning, NP  ipratropium (ATROVENT) 0.06 % nasal spray Place 2 sprays into both nostrils daily.    Historical Provider, MD  loratadine (CLARITIN) 10 MG tablet Take 10 mg by mouth daily.    Historical Provider, MD  metFORMIN (GLUCOPHAGE) 500 MG tablet Take 500 mg by mouth daily with breakfast.    Historical Provider, MD  methotrexate (RHEUMATREX) 2.5 MG tablet Take 17.5 mg by mouth once a week. Caution:Chemotherapy. Protect from light.    Historical Provider, MD  montelukast (SINGULAIR) 10 MG tablet Take 10 mg by mouth at bedtime.    Historical Provider, MD  Omega-3 Fatty Acids (FISH OIL) 1000 MG CAPS Take 1 capsule by mouth daily.    Historical Provider, MD  pantoprazole (PROTONIX) 40 MG tablet Take 1 tablet by mouth daily. 07/14/14   Historical Provider, MD  predniSONE (DELTASONE) 50 MG tablet 1 tablet daily for 5 days 02/10/15   Konrad Felix, PA   Meds Ordered and Administered this Visit   Medications  methylPREDNISolone sodium succinate (SOLU-MEDROL) 125 mg/2 mL injection 125 mg (not administered)    BP 148/66 mmHg  Pulse 88  Temp(Src) 98 F (36.7 C) (Oral)  Resp 16  SpO2 100% No data found.   Physical Exam  Constitutional: She is oriented to person, place, and time. She appears well-developed and well-nourished.  HENT:  Head: Normocephalic and atraumatic.  Right Ear: External ear normal.  Left Ear: External ear normal.  Mouth/Throat: Oropharynx is clear and moist.  Eyes: Conjunctivae are normal.  Neck: Normal range of motion. Neck supple.  Cardiovascular: Normal rate.   Pulmonary/Chest: Effort normal. She has wheezes. She has no rales. She exhibits no tenderness.  Abdominal: Soft.  Musculoskeletal: Normal range of motion.  Neurological: She is alert and oriented to person, place, and time.  Skin: Skin is warm and dry.  Psychiatric: She has a normal mood and affect. Her behavior is normal. Judgment and thought content normal.    ED Course   Procedures (including critical care time)  Labs Review Labs Reviewed - No data to display  Imaging Review No results found.   Visual Acuity Review  Right Eye Distance:   Left Eye Distance:   Bilateral Distance:    Right Eye Near:   Left Eye Near:    Bilateral Near:        Dose of solumedrol administered prior to discharge.   MDM   1. Bronchitis, acute, with bronchospasm    Pt states her PCP usually provides her with steroids, antibx. And inhaler. She states she has nebulizer solution at home which she has used.   Patient is advised to continue home symptomatic treatment. Prescription for zpak, prednisone sent pharmacy patient has indicated. Patient is advised that if there are new or worsening symptoms or attend the emergency department, or contact primary care provider. Instructions of care provided discharged home  in stable condition.  THIS NOTE WAS GENERATED USING A VOICE RECOGNITION SOFTWARE PROGRAM. ALL REASONABLE EFFORTS  WERE MADE TO PROOFREAD THIS DOCUMENT FOR ACCURACY.     Konrad Felix, PA 02/10/15 [place], [place] 04/07/15 1021

## 2015-02-22 ENCOUNTER — Encounter: Payer: Self-pay | Admitting: Pulmonary Disease

## 2015-02-22 ENCOUNTER — Ambulatory Visit (INDEPENDENT_AMBULATORY_CARE_PROVIDER_SITE_OTHER): Payer: PPO | Admitting: Pulmonary Disease

## 2015-02-22 VITALS — BP 124/72 | HR 86 | Ht 60.0 in | Wt 138.8 lb

## 2015-02-22 DIAGNOSIS — G4733 Obstructive sleep apnea (adult) (pediatric): Secondary | ICD-10-CM | POA: Diagnosis not present

## 2015-02-22 DIAGNOSIS — J309 Allergic rhinitis, unspecified: Secondary | ICD-10-CM | POA: Diagnosis not present

## 2015-02-22 DIAGNOSIS — Z9989 Dependence on other enabling machines and devices: Principal | ICD-10-CM

## 2015-02-22 DIAGNOSIS — J45901 Unspecified asthma with (acute) exacerbation: Secondary | ICD-10-CM

## 2015-02-22 MED ORDER — PREDNISONE 10 MG PO TABS: ORAL_TABLET | ORAL | Status: DC

## 2015-02-22 NOTE — Patient Instructions (Signed)
Prednisone 10 mg pill >> 3 pills daily for 2 days, 2 pills daily for 2 days, 1 pill daily for 2 days, 1/2 pill daily for 2 days.  Breo one puff daily   Ventolin 2 puffs every 4 to 6 hours as needed for cough, wheeze, or chest congestion  Try saline sinus rinse daily as needed  Follow up in 6 months

## 2015-02-22 NOTE — Progress Notes (Signed)
Current Outpatient Prescriptions on File Prior to Visit  Medication Sig  . Alpha-Lipoic Acid 200 MG CAPS Take by mouth daily.  Marland Kitchen conjugated estrogens (PREMARIN) vaginal cream Place 1 Applicatorful vaginally daily.  . diclofenac sodium (VOLTAREN) 1 % GEL Apply topically as needed.  . DULoxetine (CYMBALTA) 60 MG capsule Take 60 mg by mouth daily.  . folic acid (FOLVITE) Q000111Q MCG tablet Take 800 mcg by mouth daily.  Marland Kitchen loratadine (CLARITIN) 10 MG tablet Take 10 mg by mouth daily.  . metFORMIN (GLUCOPHAGE) 500 MG tablet Take 500 mg by mouth daily with breakfast.  . methotrexate (RHEUMATREX) 2.5 MG tablet Take 17.5 mg by mouth once a week. Caution:Chemotherapy. Protect from light.  . montelukast (SINGULAIR) 10 MG tablet Take 10 mg by mouth at bedtime.  . Omega-3 Fatty Acids (FISH OIL) 1000 MG CAPS Take 1 capsule by mouth daily.  . pantoprazole (PROTONIX) 40 MG tablet Take 1 tablet by mouth daily.   No current facility-administered medications on file prior to visit.     Chief Complaint  Patient presents with  . Sleep Apnea    Using CPAP nightly just having trouble falling asleep. No problem with mask or pressure. Only getting around 4-5 hours of sleep nightly. c/o coughing with occ white mucus, some wheezing, chest tightness. also having more headaches. dx with bronchitis 2 weeks ago completed antiibiotic.      Tests A1AT 05/30/11 >> 121, MM PSG 10/12/12 >> AHI 26 Auto CPAP 10/12/14 to 11/10/14 >> used on 30 of 30 nights with average 8 hrs and 7 min.  Average AHI is 1.1 with median CPAP 9 cm H2O and 95 th percentile CPAP 11 cm H20.   Past medical hx HLD, Fibromyalgia, PCOS, DM, RA  Past surgical hx, Allergies, Family hx, Social hx all reviewed.  Vital Signs BP 124/72 mmHg  Pulse 86  Ht 5' (1.524 m)  Wt 138 lb 12.8 oz (62.959 kg)  BMI 27.11 kg/m2  SpO2 100%  History of Present Illness DEVOTA BRADY is a 60 y.o. female with OSA and COPD with asthma.  She was previously followed  by Dr. Gwenette Greet.  She was treated for bronchitis.  She was given steroid shot, prednisone, and zpak.  She still has cough, wheeze, chest congestion.  She felt better with prednisone.  She uses breo daily.  She has sinus congestion with post nasal drip.  She has been using claritin.  She also uses singulair.  She is on MTX for RA.  She is followed by Dr. Estanislado Pandy.  She is doing well with CPAP.  She has nasal pillows mask.    Physical Exam  General - No distress ENT - No sinus tenderness, no oral exudate, no LAN Cardiac - s1s2 regular, no murmur Chest - b/l rhonchi Back - No focal tenderness Abd - Soft, non-tender Ext - No edema Neuro - Normal strength Skin - No rashes Psych - normal mood, and behavior   Assessment/Plan  Acute asthmatic bronchitis. Plan: - don't think she needs CXR or additional abx - will give additional course of prednisone  COPD with asthma. Plan: - continue breo and prn albuterol  Rhinitis. Plan: - try nasal irrigation - continue claritin, singulair - if no better than try nasal steroid again  Obstructive sleep apnea. Plan: - continue auto CPAP   Patient Instructions  Prednisone 10 mg pill >> 3 pills daily for 2 days, 2 pills daily for 2 days, 1 pill daily for 2 days, 1/2 pill daily  for 2 days.  Breo one puff daily   Ventolin 2 puffs every 4 to 6 hours as needed for cough, wheeze, or chest congestion  Try saline sinus rinse daily as needed  Follow up in 6 months     Chesley Mires, MD [place] [facility] Pager:  [phone]

## 2015-03-29 DIAGNOSIS — H9319 Tinnitus, unspecified ear: Secondary | ICD-10-CM | POA: Diagnosis not present

## 2015-03-29 DIAGNOSIS — H8110 Benign paroxysmal vertigo, unspecified ear: Secondary | ICD-10-CM | POA: Diagnosis not present

## 2015-03-29 DIAGNOSIS — B009 Herpesviral infection, unspecified: Secondary | ICD-10-CM | POA: Diagnosis not present

## 2015-04-12 ENCOUNTER — Ambulatory Visit (INDEPENDENT_AMBULATORY_CARE_PROVIDER_SITE_OTHER): Payer: PPO | Admitting: Allergy and Immunology

## 2015-04-12 ENCOUNTER — Encounter: Payer: Self-pay | Admitting: Allergy and Immunology

## 2015-04-12 VITALS — BP 110/70 | HR 80 | Temp 97.8°F | Resp 16 | Ht 59.45 in | Wt 138.4 lb

## 2015-04-12 DIAGNOSIS — J4530 Mild persistent asthma, uncomplicated: Secondary | ICD-10-CM | POA: Diagnosis not present

## 2015-04-12 DIAGNOSIS — J3089 Other allergic rhinitis: Secondary | ICD-10-CM | POA: Diagnosis not present

## 2015-04-12 MED ORDER — FLUTICASONE FUROATE 100 MCG/ACT IN AEPB: 1.0000 | INHALATION_SPRAY | Freq: Every day | RESPIRATORY_TRACT | Status: DC

## 2015-04-12 MED ORDER — AZELASTINE-FLUTICASONE 137-50 MCG/ACT NA SUSP: NASAL | Status: DC

## 2015-04-12 NOTE — Assessment & Plan Note (Signed)
   Well-controlled.  We will stepdown therapy at this time.  A sample and prescription have been provided for Arnuity Ellipta 100 g, one inhalation daily. Discontinue Brio Ellipta.  Continue montelukast 10 mg daily at bedtime and albuterol HFA every 4-6 hours as needed.  Subjective and objective measures of pulmonary function will be followed and the treatment plan will be adjusted accordingly.

## 2015-04-12 NOTE — Assessment & Plan Note (Addendum)
   Aeroallergen avoidance measures have been discussed and provided in written form.  A prescription has been provided for Dymista nasal spray, 1-2 sprays per nostril 2 times daily as needed. Proper nasal spray technique has been discussed and demonstrated.   Nasal saline lavage (NeilMed) as needed has been recommended along with instructions for proper administration.  Guaifenesin 1200 mg (plus/minus pseudoephedrine 120 mg) twice daily as needed with adequate hydration. Pseudoephedrine is only to be used for short-term relief of nasal/sinus congestion. Long-term use is discouraged due to potential side effects.   If allergen avoidance measures and medications fail to adequately relieve symptoms, aeroallergen immunotherapy will be considered.

## 2015-04-12 NOTE — Progress Notes (Signed)
New Patient Note  RE: Stephanie Norris MRN: [id number] DOB: November 04, 1955 Date of Office Visit: 04/12/2015  Referring provider: Celene Squibb, MD Primary care provider: Wende Neighbors, MD  Chief Complaint: Allergic Rhinitis  and Sinus Problem   History of present illness: HPI Comments: Stephanie Norris is a 60 y.o. female presents today for consultation of rhinosinusitis and asthma.  She complains of "constant runny nose" with crusty discharge, thick postnasal drainage, ear fullness/pressure, and sinus pressure behind the eyes.  These symptoms occur year around but her specifically triggered by strong aromas, rapid weather changes, pollen exposure, and mold exposure.  She has a diagnosis of asthma.  Her lower respiratory symptoms are typically triggered by strong aromas and mold exposure.  Using Brio Ellipta 100/25 g, one inhalation daily, she requires albuterol rescue once every 2 months on average and denies nocturnal awakenings due to lower respiratory symptoms.   Assessment and plan: Perennial allergic rhinosinusitis with a nonallergic component  Aeroallergen avoidance measures have been discussed and provided in written form.  A prescription has been provided for Dymista nasal spray, 1-2 sprays per nostril 2 times daily as needed. Proper nasal spray technique has been discussed and demonstrated.   Nasal saline lavage (NeilMed) as needed has been recommended along with instructions for proper administration.  Guaifenesin 1200 mg (plus/minus pseudoephedrine 120 mg) twice daily as needed with adequate hydration. Pseudoephedrine is only to be used for short-term relief of nasal/sinus congestion. Long-term use is discouraged due to potential side effects.   If allergen avoidance measures and medications fail to adequately relieve symptoms, aeroallergen immunotherapy will be considered.  Asthma  Well-controlled.  We will stepdown therapy at this time.  A sample and prescription have been  provided for Arnuity Ellipta 100 g, one inhalation daily. Discontinue Brio Ellipta.  Continue montelukast 10 mg daily at bedtime and albuterol HFA every 4-6 hours as needed.  Subjective and objective measures of pulmonary function will be followed and the treatment plan will be adjusted accordingly.    Meds ordered this encounter  Medications  . Azelastine-Fluticasone (DYMISTA) 137-50 MCG/ACT SUSP    Sig: USE 1-2 SPRAYS IN EACH NOSTRIL TWICE DAILY AS DIRECTED    Dispense:  23 g    Refill:  5  . Fluticasone Furoate (ARNUITY ELLIPTA) 100 MCG/ACT AEPB    Sig: Inhale 1 puff into the lungs daily.    Dispense:  30 each    Refill:  5    Diagnositics: Spirometry:  Normal with an FEV1 of 99% predicted.  Please see scanned spirometry results for details. Epicutaneous testing: Positive to ragweed pollen. Intradermal testing: Positive to molds.    Physical examination: Blood pressure 110/70, pulse 80, temperature 97.8 F (36.6 C), temperature source Oral, resp. rate 16, height 4' 11.45" (1.51 m), weight 138 lb 7.2 oz (62.8 kg).  General: Alert, interactive, in no acute distress. HEENT: TMs pearly gray, turbinates edematous without discharge, post-pharynx erythematous. Neck: Supple without lymphadenopathy. Lungs: Clear to auscultation without wheezing, rhonchi or rales. CV: Normal S1, S2 without murmurs. Abdomen: Nondistended, nontender. Skin: Warm and dry, without lesions or rashes. Extremities:  No clubbing, cyanosis or edema. Neuro:   Grossly intact.  Review of systems:  Review of Systems  Constitutional: Negative for fever, chills and weight loss.  HENT: Positive for congestion, ear pain and sore throat. Negative for nosebleeds.   Eyes: Negative for blurred vision.  Respiratory: Positive for cough, shortness of breath and wheezing. Negative for hemoptysis.   Cardiovascular: Negative  for chest pain.  Gastrointestinal: Negative for diarrhea and constipation.  Genitourinary:  Negative for dysuria.  Musculoskeletal: Negative for myalgias and joint pain.  Skin: Negative for itching and rash.  Neurological: Positive for headaches. Negative for dizziness.  Endo/Heme/Allergies: Does not bruise/bleed easily.    Past medical history:  Past Medical History  Diagnosis Date  . Hyperlipidemia   . Asthma   . OSA (obstructive sleep apnea)   . Fibromyalgia   . PCOS (polycystic ovarian syndrome)   . Diabetes mellitus without complication ([place])   . Rheumatoid arthritis ([place])   . Osteoarthritis     Past surgical history:  Past Surgical History  Procedure Laterality Date  . Cholecystectomy    . Vesicovaginal fistula closure w/ tah    . Appendectomy    . Knee surgery  1996  . Colonoscopy w/ biopsies  fall 2009  . Combined hysterectomy vaginal / oophorectomy / a&p repair  10/2002    endometriosis, cystocele, fibroids    Family history: Family History  Problem Relation Age of Onset  . Allergies      "Everyone"  . Asthma Mother   . Rheum arthritis Mother   . Allergic rhinitis Mother   . Heart disease Maternal Grandfather   . Heart disease Maternal Grandmother   . Heart disease Paternal Grandfather   . Heart disease Paternal Grandmother   . Rheum arthritis Sister   . Allergic rhinitis Sister   . Heart failure Father   . Heart disease Father   . Rheum arthritis Sister   . Allergic rhinitis Sister   . Allergic rhinitis Brother     Social history: Social History   Social History  . Marital Status: Married    Spouse Name: N/A  . Number of Children: 0  . Years of Education: N/A   Occupational History  . 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 Smoking status: Never Smoker   . Smokeless tobacco: Never Used  . Alcohol Use: No  . Drug Use: No  . Sexual Activity: Not on file   Other Topics Concern  . Not on file   Social History Narrative   Environmental History: The patient lives in an 65-year-old house with  hardwood floors throughout and central air/heat.  There are cats in the house which have access to her bedroom.  She is a nonsmoker.    Medication List       This list is accurate as of: 04/12/15  5:19 PM.  Always use your most recent med list.               Alpha-Lipoic Acid 200 MG Caps  Take by mouth daily.     Azelastine-Fluticasone 137-50 MCG/ACT Susp  Commonly known as:  DYMISTA  USE 1-2 SPRAYS IN EACH NOSTRIL TWICE DAILY AS DIRECTED     BREO ELLIPTA 100-25 MCG/INH Aepb  Generic drug:  fluticasone furoate-vilanterol  Inhale into the lungs.     DULoxetine 60 MG capsule  Commonly known as:  CYMBALTA  Take 60 mg by mouth daily.     ESTROVEN MOOD & MEMORY PO  Take by mouth.     fenofibrate 160 MG tablet  Take 1 tablet by mouth daily.     Fish Oil 1000 MG Caps  Take 1 capsule by mouth daily.     Fluticasone Furoate 100 MCG/ACT Aepb  Commonly known as:  ARNUITY ELLIPTA  Inhale 1 puff into the lungs daily.  folic acid Q000111Q MCG tablet  Commonly known as:  FOLVITE  Take 800 mcg by mouth daily.     GLIPIZIDE PO  Take by mouth.     levocetirizine 5 MG tablet  Commonly known as:  XYZAL  Take 5 mg by mouth every evening.     metFORMIN 500 MG tablet  Commonly known as:  GLUCOPHAGE  Take 500 mg by mouth daily with breakfast.     methotrexate 2.5 MG tablet  Commonly known as:  RHEUMATREX  Take 17.5 mg by mouth once a week. Caution:Chemotherapy. Protect from light.     montelukast 10 MG tablet  Commonly known as:  SINGULAIR  Take 10 mg by mouth at bedtime.     MULTIVITAMIN PO  Take by mouth.     pantoprazole 40 MG tablet  Commonly known as:  PROTONIX  Take 1 tablet by mouth daily.     simvastatin 10 MG tablet  Commonly known as:  ZOCOR  Take 10 mg by mouth daily.     VOLTAREN 1 % Gel  Generic drug:  diclofenac sodium  Apply topically as needed.        Known medication allergies: Allergies  Allergen Reactions  . Nsaids Anaphylaxis  . Red Dye  Rash    Itching  . Bactrim [Sulfamethoxazole-Trimethoprim]     Upset stomach  . Cephalexin     rash  . Levofloxacin     rash  . Nitrofurantoin Monohyd Macro     rash    I appreciate the opportunity to take part in this Montanna's care. Please do not hesitate to contact me with questions.  Sincerely,   R. Edgar Frisk, MD

## 2015-04-12 NOTE — Patient Instructions (Addendum)
Perennial allergic rhinosinusitis with a nonallergic component  Aeroallergen avoidance measures have been discussed and provided in written form.  A prescription has been provided for Dymista nasal spray, 1-2 sprays per nostril 2 times daily as needed. Proper nasal spray technique has been discussed and demonstrated.   Nasal saline lavage (NeilMed) as needed has been recommended along with instructions for proper administration.  Guaifenesin 1200 mg (plus/minus pseudoephedrine 120 mg) twice daily as needed with adequate hydration. Pseudoephedrine is only to be used for short-term relief of nasal/sinus congestion. Long-term use is discouraged due to potential side effects.   If allergen avoidance measures and medications fail to adequately relieve symptoms, aeroallergen immunotherapy will be considered.  Asthma  Well-controlled.  We will stepdown therapy at this time.  A sample and prescription have been provided for Arnuity Ellipta 100 g, one inhalation daily. Discontinue Brio Ellipta.  Continue montelukast 10 mg daily at bedtime and albuterol HFA every 4-6 hours as needed.  Subjective and objective measures of pulmonary function will be followed and the treatment plan will be adjusted accordingly.    Return in about 4 months (around 08/12/2015), or if symptoms worsen or fail to improve.  Reducing Pollen Exposure  The American Academy of Allergy, Asthma and Immunology suggests the following steps to reduce your exposure to pollen during allergy seasons.    1. Do not hang sheets or clothing out to dry; pollen may collect on these items. 2. Do not mow lawns or spend time around freshly cut grass; mowing stirs up pollen. 3. Keep windows closed at night.  Keep car windows closed while driving. 4. Minimize morning activities outdoors, a time when pollen counts are usually at their highest. 5. Stay indoors as much as possible when pollen counts or humidity is high and on windy days when  pollen tends to remain in the air longer. 6. Use air conditioning when possible.  Many air conditioners have filters that trap the pollen spores. 7. Use a HEPA room air filter to remove pollen form the indoor air you breathe.   Control of Mold Allergen  Mold and fungi can grow on a variety of surfaces provided certain temperature and moisture conditions exist.  Outdoor molds grow on plants, decaying vegetation and soil.  The major outdoor mold, Alternaria and Cladosporium, are found in very high numbers during hot and dry conditions.  Generally, a late Summer - Fall peak is seen for common outdoor fungal spores.  Rain will temporarily lower outdoor mold spore count, but counts rise rapidly when the rainy period ends.  The most important indoor molds are Aspergillus and Penicillium.  Dark, humid and poorly ventilated basements are ideal sites for mold growth.  The next most common sites of mold growth are the bathroom and the kitchen.  Outdoor [employer] 1. Use air conditioning and keep windows closed 2. Avoid exposure to decaying vegetation. 3. Avoid leaf raking. 4. Avoid grain handling. 5. Consider wearing a face mask if working in moldy areas.  Indoor Mold Control 1. Maintain humidity below 50%. 2. Clean washable surfaces with 5% bleach solution. 3. Remove sources e.g. Contaminated carpets.

## 2015-04-15 DIAGNOSIS — H8110 Benign paroxysmal vertigo, unspecified ear: Secondary | ICD-10-CM | POA: Diagnosis not present

## 2015-04-18 ENCOUNTER — Encounter: Payer: Self-pay | Admitting: *Deleted

## 2015-04-28 DIAGNOSIS — Z79899 Other long term (current) drug therapy: Secondary | ICD-10-CM | POA: Diagnosis not present

## 2015-05-09 ENCOUNTER — Telehealth: Payer: Self-pay

## 2015-05-09 NOTE — Telephone Encounter (Signed)
Patient went to pick up her Dymista and it was $80. Patient checked with her insurance and they will cover Azelastine. Patient is wondering can Dr. Verlin Fester send this in instead.    [facility] in [place]   [place] VISIT 04/12/2015

## 2015-05-10 MED ORDER — AZELASTINE HCL 0.15 % NA SOLN: 2.0000 | Freq: Every day | NASAL | Status: DC

## 2015-05-10 NOTE — Telephone Encounter (Signed)
Pt informed. Thanks

## 2015-05-10 NOTE — Telephone Encounter (Signed)
Yes, please call in Azelastine 2 sprays per nostril twice a day as well as fluticasone nasal spray one spray per nostril twice a day.

## 2015-05-10 NOTE — Telephone Encounter (Signed)
Left message for patient to call office. Sent script into [facility]

## 2015-05-17 DIAGNOSIS — H938X3 Other specified disorders of ear, bilateral: Secondary | ICD-10-CM | POA: Diagnosis not present

## 2015-05-17 DIAGNOSIS — L299 Pruritus, unspecified: Secondary | ICD-10-CM | POA: Diagnosis not present

## 2015-05-17 DIAGNOSIS — R42 Dizziness and giddiness: Secondary | ICD-10-CM | POA: Diagnosis not present

## 2015-05-31 DIAGNOSIS — E1165 Type 2 diabetes mellitus with hyperglycemia: Secondary | ICD-10-CM | POA: Diagnosis not present

## 2015-05-31 DIAGNOSIS — E782 Mixed hyperlipidemia: Secondary | ICD-10-CM | POA: Diagnosis not present

## 2015-06-03 DIAGNOSIS — K219 Gastro-esophageal reflux disease without esophagitis: Secondary | ICD-10-CM | POA: Diagnosis not present

## 2015-06-03 DIAGNOSIS — E782 Mixed hyperlipidemia: Secondary | ICD-10-CM | POA: Diagnosis not present

## 2015-06-03 DIAGNOSIS — E1165 Type 2 diabetes mellitus with hyperglycemia: Secondary | ICD-10-CM | POA: Diagnosis not present

## 2015-06-06 DIAGNOSIS — N3946 Mixed incontinence: Secondary | ICD-10-CM | POA: Diagnosis not present

## 2015-06-06 DIAGNOSIS — N3020 Other chronic cystitis without hematuria: Secondary | ICD-10-CM | POA: Diagnosis not present

## 2015-06-06 DIAGNOSIS — Z0000 Encounter for general adult medical examination without abnormal findings: Secondary | ICD-10-CM | POA: Diagnosis not present

## 2015-06-07 DIAGNOSIS — M79672 Pain in left foot: Secondary | ICD-10-CM | POA: Diagnosis not present

## 2015-06-07 DIAGNOSIS — M797 Fibromyalgia: Secondary | ICD-10-CM | POA: Diagnosis not present

## 2015-06-07 DIAGNOSIS — G4709 Other insomnia: Secondary | ICD-10-CM | POA: Diagnosis not present

## 2015-06-07 DIAGNOSIS — M79671 Pain in right foot: Secondary | ICD-10-CM | POA: Diagnosis not present

## 2015-06-07 DIAGNOSIS — M0579 Rheumatoid arthritis with rheumatoid factor of multiple sites without organ or systems involvement: Secondary | ICD-10-CM | POA: Diagnosis not present

## 2015-06-07 DIAGNOSIS — Z09 Encounter for follow-up examination after completed treatment for conditions other than malignant neoplasm: Secondary | ICD-10-CM | POA: Diagnosis not present

## 2015-06-07 DIAGNOSIS — M79641 Pain in right hand: Secondary | ICD-10-CM | POA: Diagnosis not present

## 2015-06-07 DIAGNOSIS — M79642 Pain in left hand: Secondary | ICD-10-CM | POA: Diagnosis not present

## 2015-06-14 ENCOUNTER — Encounter: Payer: Self-pay | Admitting: Hematology

## 2015-06-14 ENCOUNTER — Telehealth: Payer: Self-pay | Admitting: Hematology

## 2015-06-14 NOTE — Telephone Encounter (Signed)
Verified and updated address and insurance, faxed referring office with appt date/time, mailed new pt packet

## 2015-06-22 ENCOUNTER — Telehealth: Payer: Self-pay | Admitting: Hematology

## 2015-06-22 NOTE — Telephone Encounter (Signed)
pt cld left voicemail to CX appt stated platelets were back to normal. Sent staff message to [place] to CX appt

## 2015-06-23 ENCOUNTER — Ambulatory Visit: Payer: PPO | Admitting: Hematology

## 2015-06-30 ENCOUNTER — Telehealth: Payer: Self-pay | Admitting: Hematology

## 2015-06-30 NOTE — Telephone Encounter (Signed)
Notified Dr. Arlean Hopping office of the patient's missed appointment for Dr. Irene Limbo on 5/18. Spoke to [place].

## 2015-07-05 DIAGNOSIS — N3944 Nocturnal enuresis: Secondary | ICD-10-CM | POA: Diagnosis not present

## 2015-07-05 DIAGNOSIS — N3946 Mixed incontinence: Secondary | ICD-10-CM | POA: Diagnosis not present

## 2015-07-08 DIAGNOSIS — M10071 Idiopathic gout, right ankle and foot: Secondary | ICD-10-CM | POA: Diagnosis not present

## 2015-07-14 DIAGNOSIS — N3946 Mixed incontinence: Secondary | ICD-10-CM | POA: Diagnosis not present

## 2015-07-14 DIAGNOSIS — R350 Frequency of micturition: Secondary | ICD-10-CM | POA: Diagnosis not present

## 2015-07-28 DIAGNOSIS — R350 Frequency of micturition: Secondary | ICD-10-CM | POA: Diagnosis not present

## 2015-07-28 DIAGNOSIS — N3944 Nocturnal enuresis: Secondary | ICD-10-CM | POA: Diagnosis not present

## 2015-07-29 DIAGNOSIS — Z79899 Other long term (current) drug therapy: Secondary | ICD-10-CM | POA: Diagnosis not present

## 2015-08-16 ENCOUNTER — Encounter (INDEPENDENT_AMBULATORY_CARE_PROVIDER_SITE_OTHER): Payer: Self-pay

## 2015-08-16 ENCOUNTER — Encounter: Payer: Self-pay | Admitting: Allergy and Immunology

## 2015-08-16 ENCOUNTER — Ambulatory Visit (INDEPENDENT_AMBULATORY_CARE_PROVIDER_SITE_OTHER): Payer: PPO | Admitting: Allergy and Immunology

## 2015-08-16 VITALS — BP 110/70 | HR 68 | Resp 16

## 2015-08-16 DIAGNOSIS — J4540 Moderate persistent asthma, uncomplicated: Secondary | ICD-10-CM

## 2015-08-16 DIAGNOSIS — J3089 Other allergic rhinitis: Secondary | ICD-10-CM | POA: Diagnosis not present

## 2015-08-16 MED ORDER — FLUTICASONE FUROATE-VILANTEROL 100-25 MCG/INH IN AEPB: 1.0000 | INHALATION_SPRAY | Freq: Every day | RESPIRATORY_TRACT | Status: DC

## 2015-08-16 MED ORDER — FLUTICASONE FUROATE 100 MCG/ACT IN AEPB: 1.0000 | INHALATION_SPRAY | Freq: Every day | RESPIRATORY_TRACT | Status: DC

## 2015-08-16 NOTE — Progress Notes (Signed)
Follow-up Note  RE: Stephanie Norris MRN: [id number] DOB: 1955/03/03 Date of Office Visit: 08/16/2015  Primary care provider: Wende Neighbors, MD Referring provider: Celene Squibb, MD  History of present illness: HPI Comments: Stephanie Norris is a 60 y.o. female with persistent asthma and mixed rhinitis presenting today for follow up.  She was last seen in this office on 04/12/2015.  She reports that her asthma had been well-controlled on Arnuity elliptic 100/25 g, one inhalation daily.  However, she has experienced more frequent asthma symptoms because of the recent hot/humid weather.  She reports that Dymista had provided significant nasal symptom relief, however her insurance did not cover this medication.  Therefore we started her on fluticasone nasal spray and azelastine nasal spray.  She reports that this commendation works well, but it is not as convenient.   Assessment and plan: Moderate persistent asthma Suboptimal control with the heat/humidity.  A prescription has been provided for Breo Ellipta (fluticasone furoate/vilanterol) 100/25 g, one inhalation daily.  Discontinue Arnuity.  For now, continue montelukast 10 mg daily at bedtime and albuterol every 4-6 hours as needed.  Subjective and objective measures of pulmonary function will be followed and the treatment plan will be adjusted accordingly.  Perennial allergic rhinosinusitis with a nonallergic component  Continue appropriate allergen avoidance measures, fluticasone nasal spray as needed, azelastine nasal spray as needed, and nasal saline irrigation as needed.    Meds ordered this encounter  Medications  . DISCONTD: Fluticasone Furoate (ARNUITY ELLIPTA) 100 MCG/ACT AEPB    Sig: Inhale 1 puff into the lungs daily.    Dispense:  30 each    Refill:  5  . fluticasone furoate-vilanterol (BREO ELLIPTA) 100-25 MCG/INH AEPB    Sig: Inhale 1 puff into the lungs daily.    Dispense:  1 each    Refill:  3     Diagnositics: Spirometry:  Normal with an FEV1 of 98% predicted.  Please see scanned spirometry results for details.    Physical examination: Blood pressure 110/70, pulse 68, resp. rate 16.  General: Alert, interactive, in no acute distress. HEENT: TMs pearly gray, turbinates mildly edematous without discharge, post-pharynx mildly erythematous. Neck: Supple without lymphadenopathy. Lungs: Clear to auscultation without wheezing, rhonchi or rales. CV: Normal S1, S2 without murmurs. Skin: Warm and dry, without lesions or rashes.  The following portions of the patient's history were reviewed and updated as appropriate: allergies, current medications, past family history, past medical history, past social history, past surgical history and problem list.    Medication List       This list is accurate as of: 08/16/15  7:06 PM.  Always use your most recent med list.               Alpha-Lipoic Acid 200 MG Caps  Take by mouth daily.     Azelastine HCl 0.15 % Soln  Place 2 sprays into both nostrils daily.     DULoxetine 60 MG capsule  Commonly known as:  CYMBALTA  Take 60 mg by mouth daily.     ESTROVEN MOOD & MEMORY PO  Take by mouth.     Fish Oil 1000 MG Caps  Take 1 capsule by mouth daily.     fluticasone furoate-vilanterol 100-25 MCG/INH Aepb  Commonly known as:  BREO ELLIPTA  Inhale 1 puff into the lungs daily.     folic acid Q000111Q MCG tablet  Commonly known as:  FOLVITE  Take 800 mcg by mouth daily.  levocetirizine 5 MG tablet  Commonly known as:  XYZAL  Take 5 mg by mouth every evening.     metFORMIN 500 MG tablet  Commonly known as:  GLUCOPHAGE  Take 500 mg by mouth daily with breakfast.     methotrexate 2.5 MG tablet  Commonly known as:  RHEUMATREX  Take 17.5 mg by mouth once a week. Caution:Chemotherapy. Protect from light.     montelukast 10 MG tablet  Commonly known as:  SINGULAIR  Take 10 mg by mouth at bedtime.     MULTIVITAMIN PO  Take by  mouth.     omega-3 acid ethyl esters 1 g capsule  Commonly known as:  LOVAZA  Take by mouth.     pantoprazole 40 MG tablet  Commonly known as:  PROTONIX  Take 1 tablet by mouth daily.     VOLTAREN 1 % Gel  Generic drug:  diclofenac sodium  Apply topically as needed.        Allergies  Allergen Reactions  . Nsaids Anaphylaxis  . Red Dye Rash    Itching  . Bactrim [Sulfamethoxazole-Trimethoprim]     Upset stomach  . Cephalexin     rash  . Levofloxacin     rash  . Nitrofurantoin Monohyd Macro     rash    I appreciate the opportunity to take part in Karle's care. Please do not hesitate to contact me with questions.  Sincerely,   R. Edgar Frisk, MD

## 2015-08-16 NOTE — Assessment & Plan Note (Signed)
Suboptimal control with the heat/humidity.  A prescription has been provided for Breo Ellipta (fluticasone furoate/vilanterol) 100/25 g, one inhalation daily.  Discontinue Arnuity.  For now, continue montelukast 10 mg daily at bedtime and albuterol every 4-6 hours as needed.  Subjective and objective measures of pulmonary function will be followed and the treatment plan will be adjusted accordingly.

## 2015-08-16 NOTE — Patient Instructions (Addendum)
Moderate persistent asthma Suboptimal control with the heat/humidity.  A prescription has been provided for Breo Ellipta (fluticasone furoate/vilanterol) 100/25 g, one inhalation daily.  Discontinue Arnuity.  For now, continue montelukast 10 mg daily at bedtime and albuterol every 4-6 hours as needed.  Subjective and objective measures of pulmonary function will be followed and the treatment plan will be adjusted accordingly.  Perennial allergic rhinosinusitis with a nonallergic component  Continue appropriate allergen avoidance measures, fluticasone nasal spray as needed, azelastine nasal spray as needed, and nasal saline irrigation as needed.    Return in about 4 months (around 12/17/2015), or if symptoms worsen or fail to improve.

## 2015-08-16 NOTE — Assessment & Plan Note (Signed)
   Continue appropriate allergen avoidance measures, fluticasone nasal spray as needed, azelastine nasal spray as needed, and nasal saline irrigation as needed.

## 2015-08-22 ENCOUNTER — Ambulatory Visit: Payer: PPO | Admitting: Pulmonary Disease

## 2015-08-22 DIAGNOSIS — J069 Acute upper respiratory infection, unspecified: Secondary | ICD-10-CM | POA: Diagnosis not present

## 2015-08-22 DIAGNOSIS — J209 Acute bronchitis, unspecified: Secondary | ICD-10-CM | POA: Diagnosis not present

## 2015-08-25 ENCOUNTER — Ambulatory Visit: Payer: PPO | Admitting: Pulmonary Disease

## 2015-09-08 DIAGNOSIS — Z79899 Other long term (current) drug therapy: Secondary | ICD-10-CM | POA: Diagnosis not present

## 2015-09-28 DIAGNOSIS — E782 Mixed hyperlipidemia: Secondary | ICD-10-CM | POA: Diagnosis not present

## 2015-09-28 DIAGNOSIS — E1165 Type 2 diabetes mellitus with hyperglycemia: Secondary | ICD-10-CM | POA: Diagnosis not present

## 2015-09-29 DIAGNOSIS — D230 Other benign neoplasm of skin of lip: Secondary | ICD-10-CM | POA: Diagnosis not present

## 2015-09-29 DIAGNOSIS — L7211 Pilar cyst: Secondary | ICD-10-CM | POA: Diagnosis not present

## 2015-09-29 DIAGNOSIS — L723 Sebaceous cyst: Secondary | ICD-10-CM | POA: Diagnosis not present

## 2015-09-29 DIAGNOSIS — D210 Benign neoplasm of connective and other soft tissue of head, face and neck: Secondary | ICD-10-CM | POA: Diagnosis not present

## 2015-09-30 DIAGNOSIS — K219 Gastro-esophageal reflux disease without esophagitis: Secondary | ICD-10-CM | POA: Diagnosis not present

## 2015-09-30 DIAGNOSIS — Z0000 Encounter for general adult medical examination without abnormal findings: Secondary | ICD-10-CM | POA: Diagnosis not present

## 2015-09-30 DIAGNOSIS — E1165 Type 2 diabetes mellitus with hyperglycemia: Secondary | ICD-10-CM | POA: Diagnosis not present

## 2015-09-30 DIAGNOSIS — E782 Mixed hyperlipidemia: Secondary | ICD-10-CM | POA: Diagnosis not present

## 2015-10-14 DIAGNOSIS — M810 Age-related osteoporosis without current pathological fracture: Secondary | ICD-10-CM | POA: Diagnosis not present

## 2015-10-14 DIAGNOSIS — Z09 Encounter for follow-up examination after completed treatment for conditions other than malignant neoplasm: Secondary | ICD-10-CM | POA: Diagnosis not present

## 2015-10-14 DIAGNOSIS — R5381 Other malaise: Secondary | ICD-10-CM | POA: Diagnosis not present

## 2015-10-14 DIAGNOSIS — M2550 Pain in unspecified joint: Secondary | ICD-10-CM | POA: Diagnosis not present

## 2015-10-14 DIAGNOSIS — M0579 Rheumatoid arthritis with rheumatoid factor of multiple sites without organ or systems involvement: Secondary | ICD-10-CM | POA: Diagnosis not present

## 2015-10-14 DIAGNOSIS — M797 Fibromyalgia: Secondary | ICD-10-CM | POA: Diagnosis not present

## 2015-10-17 DIAGNOSIS — L7211 Pilar cyst: Secondary | ICD-10-CM | POA: Diagnosis not present

## 2015-10-17 DIAGNOSIS — D1039 Benign neoplasm of other parts of mouth: Secondary | ICD-10-CM | POA: Diagnosis not present

## 2015-10-28 DIAGNOSIS — R52 Pain, unspecified: Secondary | ICD-10-CM | POA: Diagnosis not present

## 2015-11-02 ENCOUNTER — Encounter: Payer: Self-pay | Admitting: Pulmonary Disease

## 2015-11-02 ENCOUNTER — Ambulatory Visit (INDEPENDENT_AMBULATORY_CARE_PROVIDER_SITE_OTHER): Payer: PPO | Admitting: Pulmonary Disease

## 2015-11-02 VITALS — BP 104/74 | HR 79 | Ht 59.5 in | Wt 133.8 lb

## 2015-11-02 DIAGNOSIS — G4733 Obstructive sleep apnea (adult) (pediatric): Secondary | ICD-10-CM

## 2015-11-02 DIAGNOSIS — J45909 Unspecified asthma, uncomplicated: Secondary | ICD-10-CM

## 2015-11-02 DIAGNOSIS — J449 Chronic obstructive pulmonary disease, unspecified: Secondary | ICD-10-CM

## 2015-11-02 DIAGNOSIS — Z23 Encounter for immunization: Secondary | ICD-10-CM | POA: Diagnosis not present

## 2015-11-02 DIAGNOSIS — J4540 Moderate persistent asthma, uncomplicated: Secondary | ICD-10-CM | POA: Diagnosis not present

## 2015-11-02 MED ORDER — FLUTICASONE FUROATE-VILANTEROL 100-25 MCG/INH IN AEPB: 1.0000 | INHALATION_SPRAY | Freq: Every day | RESPIRATORY_TRACT | 5 refills | Status: DC

## 2015-11-02 NOTE — Patient Instructions (Signed)
Breo one puff daily  Pneumovax and Influenza vaccines today  Follow up in 6 months

## 2015-11-02 NOTE — Progress Notes (Signed)
Current Outpatient Prescriptions on File Prior to Visit  Medication Sig  . Alpha-Lipoic Acid 200 MG CAPS Take by mouth daily.  . Azelastine HCl 0.15 % SOLN Place 2 sprays into both nostrils daily.  . diclofenac sodium (VOLTAREN) 1 % GEL Apply topically as needed.  . DULoxetine (CYMBALTA) 60 MG capsule Take 60 mg by mouth daily.  . folic acid (FOLVITE) Q000111Q MCG tablet Take 800 mcg by mouth daily.  Marland Kitchen levocetirizine (XYZAL) 5 MG tablet Take 5 mg by mouth every evening.  . metFORMIN (GLUCOPHAGE) 500 MG tablet Take 500 mg by mouth daily with breakfast.  . methotrexate (RHEUMATREX) 2.5 MG tablet Take 17.5 mg by mouth once a week. Caution:Chemotherapy. Protect from light.  . montelukast (SINGULAIR) 10 MG tablet Take 10 mg by mouth at bedtime.  . Multiple Vitamins-Minerals (MULTIVITAMIN PO) Take by mouth.  . Omega-3 Fatty Acids (FISH OIL) 1000 MG CAPS Take 1 capsule by mouth daily.  . pantoprazole (PROTONIX) 40 MG tablet Take 1 tablet by mouth daily.   No current facility-administered medications on file prior to visit.      Chief Complaint  Patient presents with  . Follow-up    Pt. States that she has not used her CPAP for x 4 months,Pt. states she stopped taking her Breo x 2 days ago, would like to discuss if she should still be taking it DME APS    Pulmonary tests A1AT 05/30/11 >> 121, MM  Sleep tests PSG 10/12/12 >> AHI 26 Auto CPAP 10/12/14 to 11/10/14 >> used on 30 of 30 nights with average 8 hrs and 7 min.  Average AHI is 1.1 with median CPAP 9 cm H2O and 95 th percentile CPAP 11 cm H20.  Past medical history HLD, Fibromyalgia, PCOS, DM, RA  Past surgical history, Family history, Social history, Allergies reviewed  Vital Signs BP 104/74 (BP Location: Right Arm, Patient Position: Sitting, Cuff Size: Normal)   Pulse 79   Ht 4' 11.5" (1.511 m)   Wt 133 lb 12.8 oz (60.7 kg)   SpO2 98%   BMI 26.57 kg/m   History of Present Illness Stephanie Norris is a 60 y.o. female with OSA and  COPD with asthma.  Her allergist switched her to arnuity.  Her breathing was much worse.  She was switched back to breo and feels this works better.   She is not having cough, wheeze, or sputum.  She was started on astelin and flonase.  Her sinuses are much better.  She feels this improved her breathing at night, and as a result stopped using CPAP.  She hasn't noticed a difference w/o CPAP.  She is on MTX for RA.  She is followed by Dr. Estanislado Pandy.   Physical Exam  General - No distress ENT - No sinus tenderness, no oral exudate, no LAN Cardiac - s1s2 regular, no murmur Chest - no wheeze Back - No focal tenderness Abd - Soft, non-tender Ext - No edema Neuro - Normal strength Skin - No rashes Psych - normal mood, and behavior   Assessment/Plan  COPD with asthma. - continue breo, singulair and prn albuterol - pneumovax and influenza vaccines today  Rhinitis. - nasal irrigation, astelin, flonase - continue claritin, singulair  Obstructive sleep apnea. - discussed how untreated sleep apnea can impact her health - she will call if she would like to re-assess status of sleep apnea and consider resuming therapy   Patient Instructions  Breo one puff daily  Pneumovax and Influenza vaccines today  Follow up in 6 months   Chesley Mires, MD [facility] Pager:  [phone] 11/02/2015, 11:40 AM

## 2015-11-02 NOTE — Addendum Note (Signed)
Addended by: Benson Setting L on: 11/02/2015 02:26 PM   Modules accepted: Orders

## 2015-11-15 DIAGNOSIS — Z6826 Body mass index (BMI) 26.0-26.9, adult: Secondary | ICD-10-CM | POA: Diagnosis not present

## 2015-11-15 DIAGNOSIS — J060 Acute laryngopharyngitis: Secondary | ICD-10-CM | POA: Diagnosis not present

## 2015-11-18 ENCOUNTER — Telehealth: Payer: Self-pay | Admitting: Pulmonary Disease

## 2015-11-18 ENCOUNTER — Telehealth: Payer: Self-pay | Admitting: Family Medicine

## 2015-11-18 NOTE — Telephone Encounter (Signed)
LMTCB

## 2015-11-21 NOTE — Telephone Encounter (Signed)
Nothing is documented in this phone note.  Will sign off

## 2015-11-22 NOTE — Telephone Encounter (Signed)
Pt returning call.Stephanie Norris ° °

## 2015-11-22 NOTE — Telephone Encounter (Signed)
lmomtcb x 2  

## 2015-11-22 NOTE — Telephone Encounter (Signed)
Called and spoke with pt and she stated that when she was seen by VS recently her AVS stated that she has COPD.  Pt stated that she has never been told that she has COPD and she was not aware of this and wanted to make sure that this was not printed in error.  VS please advise. Thanks  Allergies  Allergen Reactions  . Nsaids Anaphylaxis  . Red Dye Rash    Itching  . Bactrim [Sulfamethoxazole-Trimethoprim]     Upset stomach  . Cephalexin     rash  . Levofloxacin     rash  . Nitrofurantoin Monohyd Macro     rash

## 2015-11-23 DIAGNOSIS — J060 Acute laryngopharyngitis: Secondary | ICD-10-CM | POA: Diagnosis not present

## 2015-11-23 NOTE — Telephone Encounter (Signed)
Please explain to her that her asthma has caused airflow obstruction, and she therefore has COPD secondary to asthma.

## 2015-11-23 NOTE — Telephone Encounter (Signed)
Pt aware of VS below messgae. Pt states she would like to discuss this with Dr. Halford Chessman, I offered pt a sooner appointment then 04/2016. Pt refused earlier appointment and states she will discuss this with VS in March.  Nothing further needed

## 2015-12-07 ENCOUNTER — Other Ambulatory Visit: Payer: Self-pay | Admitting: Rheumatology

## 2015-12-08 NOTE — Telephone Encounter (Signed)
Last visit 10/14/15 Next visit 01/17/16 Labs 10/31/15 Ok to refill per Dr Estanislado Pandy

## 2015-12-19 ENCOUNTER — Encounter: Payer: Self-pay | Admitting: Allergy and Immunology

## 2015-12-19 ENCOUNTER — Ambulatory Visit (INDEPENDENT_AMBULATORY_CARE_PROVIDER_SITE_OTHER): Payer: PPO | Admitting: Allergy and Immunology

## 2015-12-19 ENCOUNTER — Encounter (INDEPENDENT_AMBULATORY_CARE_PROVIDER_SITE_OTHER): Payer: Self-pay

## 2015-12-19 VITALS — BP 110/68 | HR 77 | Temp 97.6°F | Resp 20

## 2015-12-19 DIAGNOSIS — J309 Allergic rhinitis, unspecified: Secondary | ICD-10-CM

## 2015-12-19 DIAGNOSIS — J4540 Moderate persistent asthma, uncomplicated: Secondary | ICD-10-CM | POA: Diagnosis not present

## 2015-12-19 MED ORDER — IPRATROPIUM BROMIDE 0.06 % NA SOLN: 2.0000 | Freq: Three times a day (TID) | NASAL | 5 refills | Status: DC

## 2015-12-19 MED ORDER — FLUTICASONE FUROATE 200 MCG/ACT IN AEPB: 1.0000 | INHALATION_SPRAY | Freq: Every day | RESPIRATORY_TRACT | 3 refills | Status: DC

## 2015-12-19 NOTE — Progress Notes (Signed)
Follow-up Note  RE: Stephanie Norris MRN: [id number] DOB: 1955-08-05 Date of Office Visit: 12/19/2015  Primary care provider: Wende Neighbors, MD Referring provider: Celene Squibb, MD  History of present illness: Stephanie Norris is a 60 y.o. female with persistent asthma and mixed rhinitis presenting today for follow up.  She was last seen in this clinic on 08/16/2015.  She reports that her asthma has been very well controlled since the season has changed in the temperature has cooled down.  She had been experiencing asthma symptoms due to heat/humidity during the summertime/early fall, however now her asthma is well controlled.  She reports that despite compliance with azelastine nasal spray and fluticasone nasal spray she still experiences frequent rhinorrhea.  The rhinorrhea apparently has caused a sore to develop in her right nostril.   Assessment and plan: Moderate persistent asthma Well-controlled, we will stepdown therapy at this time.  A sample and prescription have been provided for Arnuity Ellipta 200 g, one inhalation daily.   If lower respiratory symptoms progress in frequency and/or severity, the patient is to restart Breo Ellipta.  Continue montelukast 10 mg daily bedtime and albuterol HFA, 1-2 inhalations every 4-6 hours as needed.  Subjective and objective measures of pulmonary function will be followed and the treatment plan will be adjusted accordingly.  Perennial allergic rhinosinusitis with a nonallergic component  Continue appropriate allergen avoidance measures and azelastine nasal spray 2 sprays per nostril twice a day.  A prescription has been provided for ipratropium 0.06% nasal spray, 2 sprays per nostril 2 or 3 times daily as needed.  Nasal saline irrigation is recommended prior to medicated nasal sprays.  Hold fluticasone nasal spray for now.   Meds ordered this encounter  Medications  . ipratropium (ATROVENT) 0.06 % nasal spray    Sig: Place 2 sprays into  both nostrils 3 (three) times daily.    Dispense:  15 mL    Refill:  5  . Fluticasone Furoate (ARNUITY ELLIPTA) 200 MCG/ACT AEPB    Sig: Inhale 1 puff into the lungs daily.    Dispense:  1 each    Refill:  3    Diagnostics: Spirometry:  Normal with an FEV1 of 91% predicted.  Please see scanned spirometry results for details.    Physical examination: Blood pressure 110/68, pulse 77, temperature 97.6 F (36.4 C), temperature source Oral, resp. rate 20, SpO2 98 %.  General: Alert, interactive, in no acute distress. HEENT: TMs pearly gray, turbinates mildly edematous with clear discharge, post-pharynx mildly erythematous. Neck: Supple without lymphadenopathy. Lungs: Clear to auscultation without wheezing, rhonchi or rales. CV: Normal S1, S2 without murmurs. Skin: Warm and dry, without lesions or rashes.  The following portions of the patient's history were reviewed and updated as appropriate: allergies, current medications, past family history, past medical history, past social history, past surgical history and problem list.    Medication List       Accurate as of 12/19/15  6:46 PM. Always use your most recent med list.          Alpha-Lipoic Acid 200 MG Caps Take by mouth daily.   Azelastine HCl 0.15 % Soln Place 2 sprays into both nostrils daily.   DULoxetine 60 MG capsule Commonly known as:  CYMBALTA Take 60 mg by mouth daily.   ESTROVEN PO Take by mouth.   fenofibrate 160 MG tablet Take 160 mg by mouth daily.   Fish Oil 1000 MG Caps Take 1 capsule by mouth daily.  fluticasone 50 MCG/ACT nasal spray Commonly known as:  FLONASE Place into both nostrils daily.   Fluticasone Furoate 200 MCG/ACT Aepb Commonly known as:  ARNUITY ELLIPTA Inhale 1 puff into the lungs daily.   fluticasone furoate-vilanterol 100-25 MCG/INH Aepb Commonly known as:  BREO ELLIPTA Inhale 1 puff into the lungs daily.   folic acid 1 MG tablet Commonly known as:  FOLVITE TAKE TWO  TABLETS BY MOUTH ONCE DAILY   ipratropium 0.06 % nasal spray Commonly known as:  ATROVENT Place 2 sprays into both nostrils 3 (three) times daily.   levocetirizine 5 MG tablet Commonly known as:  XYZAL Take 5 mg by mouth every evening.   metFORMIN 500 MG tablet Commonly known as:  GLUCOPHAGE Take 500 mg by mouth daily with breakfast.   methotrexate 2.5 MG tablet Commonly known as:  RHEUMATREX Take 17.5 mg by mouth once a week. Caution:Chemotherapy. Protect from light.   montelukast 10 MG tablet Commonly known as:  SINGULAIR Take 10 mg by mouth at bedtime.   MULTIVITAMIN PO Take by mouth.   pantoprazole 40 MG tablet Commonly known as:  PROTONIX Take 1 tablet by mouth daily.   simvastatin 20 MG tablet Commonly known as:  ZOCOR Take 20 mg by mouth daily.   VOLTAREN 1 % Gel Generic drug:  diclofenac sodium Apply topically as needed.       Allergies  Allergen Reactions  . Nsaids Anaphylaxis  . Red Dye Rash    Itching  . Bactrim [Sulfamethoxazole-Trimethoprim]     Upset stomach  . Cephalexin     rash  . Levofloxacin     rash  . Nitrofurantoin Monohyd Macro     rash    I appreciate the opportunity to take part in [name]'s care. Please do not hesitate to contact me with questions.  Sincerely,   R. Edgar Frisk, MD

## 2015-12-19 NOTE — Assessment & Plan Note (Signed)
Well-controlled, we will stepdown therapy at this time.  A sample and prescription have been provided for Arnuity Ellipta 200 g, one inhalation daily.   If lower respiratory symptoms progress in frequency and/or severity, the patient is to restart Breo Ellipta.  Continue montelukast 10 mg daily bedtime and albuterol HFA, 1-2 inhalations every 4-6 hours as needed.  Subjective and objective measures of pulmonary function will be followed and the treatment plan will be adjusted accordingly.

## 2015-12-19 NOTE — Assessment & Plan Note (Signed)
   Continue appropriate allergen avoidance measures and azelastine nasal spray 2 sprays per nostril twice a day.  A prescription has been provided for ipratropium 0.06% nasal spray, 2 sprays per nostril 2 or 3 times daily as needed.  Nasal saline irrigation is recommended prior to medicated nasal sprays.  Hold fluticasone nasal spray for now.

## 2015-12-19 NOTE — Patient Instructions (Signed)
Moderate persistent asthma Well-controlled, we will stepdown therapy at this time.  A sample and prescription have been provided for Arnuity Ellipta 200 g, one inhalation daily.   If lower respiratory symptoms progress in frequency and/or severity, the patient is to restart Breo Ellipta.  Continue montelukast 10 mg daily bedtime and albuterol HFA, 1-2 inhalations every 4-6 hours as needed.  Subjective and objective measures of pulmonary function will be followed and the treatment plan will be adjusted accordingly.  Perennial allergic rhinosinusitis with a nonallergic component  Continue appropriate allergen avoidance measures and azelastine nasal spray 2 sprays per nostril twice a day.  A prescription has been provided for ipratropium 0.06% nasal spray, 2 sprays per nostril 2 or 3 times daily as needed.  Nasal saline irrigation is recommended prior to medicated nasal sprays.  Hold fluticasone nasal spray for now.   Return in about 4 months (around 04/17/2016), or if symptoms worsen or fail to improve.

## 2015-12-20 ENCOUNTER — Encounter: Payer: Self-pay | Admitting: Allergy and Immunology

## 2016-01-03 ENCOUNTER — Other Ambulatory Visit: Payer: Self-pay | Admitting: Radiology

## 2016-01-03 DIAGNOSIS — Z79899 Other long term (current) drug therapy: Secondary | ICD-10-CM

## 2016-01-04 ENCOUNTER — Telehealth: Payer: Self-pay | Admitting: Radiology

## 2016-01-04 DIAGNOSIS — G609 Hereditary and idiopathic neuropathy, unspecified: Secondary | ICD-10-CM | POA: Diagnosis not present

## 2016-01-04 DIAGNOSIS — E119 Type 2 diabetes mellitus without complications: Secondary | ICD-10-CM | POA: Diagnosis not present

## 2016-01-04 DIAGNOSIS — E7800 Pure hypercholesterolemia, unspecified: Secondary | ICD-10-CM | POA: Diagnosis not present

## 2016-01-04 LAB — COMPLETE METABOLIC PANEL WITH GFR
ALT: 22 U/L (ref 6–29)
AST: 20 U/L (ref 10–35)
Albumin: 3.9 g/dL (ref 3.6–5.1)
Alkaline Phosphatase: 55 U/L (ref 33–130)
BUN: 22 mg/dL (ref 7–25)
CHLORIDE: 103 mmol/L (ref 98–110)
CO2: 23 mmol/L (ref 20–31)
CREATININE: 0.78 mg/dL (ref 0.50–0.99)
Calcium: 9.8 mg/dL (ref 8.6–10.4)
GFR, Est Non African American: 82 mL/min (ref >=60)
Glucose, Bld: 213 mg/dL — ABNORMAL HIGH (ref 65–99)
POTASSIUM: 4.2 mmol/L (ref 3.5–5.3)
Sodium: 136 mmol/L (ref 135–146)
Total Bilirubin: 0.2 mg/dL (ref 0.2–1.2)
Total Protein: 6.3 g/dL (ref 6.1–8.1)

## 2016-01-04 LAB — CBC WITH DIFFERENTIAL/PLATELET
BASOS PCT: 0 %
Basophils Absolute: 0 cells/uL (ref 0–200)
EOS ABS: 120 {cells}/uL (ref 15–500)
Eosinophils Relative: 1 %
HCT: 36.1 % (ref 35.0–45.0)
Hemoglobin: 12 g/dL (ref 11.7–15.5)
LYMPHS PCT: 27 %
Lymphs Abs: 3240 cells/uL (ref 850–3900)
MCH: 30.5 pg (ref 27.0–33.0)
MCHC: 33.2 g/dL (ref 32.0–36.0)
MCV: 91.9 fL (ref 80.0–100.0)
MONOS PCT: 5 %
MPV: 11.1 fL (ref 7.5–12.5)
Monocytes Absolute: 600 cells/uL (ref 200–950)
NEUTROS PCT: 67 %
Neutro Abs: 8040 cells/uL — ABNORMAL HIGH (ref 1500–7800)
PLATELETS: 429 10*3/uL — AB (ref 140–400)
RBC: 3.93 MIL/uL (ref 3.80–5.10)
RDW: 13.8 % (ref 11.0–15.0)
WBC: 12 10*3/uL — AB (ref 3.8–10.8)

## 2016-01-04 NOTE — Telephone Encounter (Signed)
Patient returned Amy's phone call. Please call

## 2016-01-04 NOTE — Telephone Encounter (Signed)
I have called patient again. She states she took one dose only about a month ago. Told her likely not from the one dose , will monitor. She states she did recently see Dr Chalmers Cater for her glucose monitoring

## 2016-01-04 NOTE — Telephone Encounter (Signed)
Left message for patient to call back about labs/ is she on prednisone ? WBC and Glucose elevated. If not, has she been sick?

## 2016-01-13 DIAGNOSIS — M19041 Primary osteoarthritis, right hand: Secondary | ICD-10-CM | POA: Insufficient documentation

## 2016-01-13 DIAGNOSIS — M47816 Spondylosis without myelopathy or radiculopathy, lumbar region: Secondary | ICD-10-CM | POA: Insufficient documentation

## 2016-01-13 DIAGNOSIS — M47812 Spondylosis without myelopathy or radiculopathy, cervical region: Secondary | ICD-10-CM | POA: Insufficient documentation

## 2016-01-13 DIAGNOSIS — M19079 Primary osteoarthritis, unspecified ankle and foot: Secondary | ICD-10-CM | POA: Insufficient documentation

## 2016-01-13 DIAGNOSIS — M0579 Rheumatoid arthritis with rheumatoid factor of multiple sites without organ or systems involvement: Secondary | ICD-10-CM | POA: Insufficient documentation

## 2016-01-13 DIAGNOSIS — Z1589 Genetic susceptibility to other disease: Secondary | ICD-10-CM | POA: Insufficient documentation

## 2016-01-13 DIAGNOSIS — M19042 Primary osteoarthritis, left hand: Principal | ICD-10-CM

## 2016-01-13 NOTE — Progress Notes (Signed)
Office Visit Note  Patient: Stephanie Norris             Date of Birth: 02/20/1955           MRN: [id number]             PCP: Wende Neighbors, MD Referring: Celene Squibb, MD Visit Date: 01/17/2016 Occupation: '@GUAROCC'$ @    Subjective:  Follow-up (patient states she is well) Follow-up on rheumatoid arthritis and fibromyalgia  History of Present Illness: Stephanie Norris is a 60 y.o. female  Last seen 08/13/2015 Doing really well with her rheumatoid arthritis. No joint pain or swelling or stiffness.  Doing fairly well with the fibromyalgia after she started doing water aerobics. She seeing a lot of benefit from her water aerobics classes. She rates her fibromyalgia discomfort as a 4 on a scale of 0-10. Currently she is having pain to the upper thoracic spine. I suspect that this is possibly coming from bilateral trapezius muscle spasm. Patient cannot take baclofen since it does not work. She is on Cymbalta which is helping her a lot. I offered her Robaxin (it does not interfere with Cymbalta according to up-to-date) and patient is agreeable. She can use it when necessary.  She is getting good night sleep and her fatigue is tolerable at this time.  She also has gout which was diagnosed by her PCP. He prescribed her some Colcrys on the last visit in September and she has not had any flares since then. She is continuing allopurinol that was prescribed by her PCP. We'll be happy to prescribe it for her when she runs out.  Since she is doing well with her RA, she is asking if she can decrease her methotrexate from 6 pills per week to 5 pills per week. I'm agreeable. However if she flares she'll need extra methotrexate. As a result of give her 6 pills per week refill for now for 90 days and then the next refill will be 5 pills per week if she does well. However if she is doing poorly she'll have access to additional medication which We know works well for her.  Activities of Daily Living:  Patient  reports morning stiffness for 15 minutes.   Patient Denies nocturnal pain.  Difficulty dressing/grooming: Denies Difficulty climbing stairs: Denies Difficulty getting out of chair: Denies Difficulty using hands for taps, buttons, cutlery, and/or writing: Denies   Review of Systems  Constitutional: Positive for fatigue.  HENT: Negative for mouth sores and mouth dryness.   Eyes: Negative for dryness.  Respiratory: Negative for shortness of breath.   Gastrointestinal: Negative for constipation and diarrhea.  Musculoskeletal: Positive for myalgias and myalgias.  Skin: Negative for sensitivity to sunlight.  Psychiatric/Behavioral: Positive for sleep disturbance. Negative for decreased concentration.    PMFS History:  Patient Active Problem List   Diagnosis Date Noted  . Rheumatoid arthritis with rheumatoid factor of multiple sites without organ or systems involvement ([place]) 01/13/2016  . HLA B27 (HLA B27 positive) 01/13/2016  . Osteoarthritis of foot 01/13/2016  . DJD (degenerative joint disease), cervical 01/13/2016  . Spondylosis of lumbar region without myelopathy or radiculopathy 01/13/2016  . Primary osteoarthritis of both hands 01/13/2016  . Perennial allergic rhinosinusitis with a nonallergic component 08/13/2014  . Stiffness of joints, not elsewhere classified, multiple sites 08/26/2013  . Flu-like symptoms 03/02/2013  . Lactic acidosis 03/02/2013  . Sinus tachycardia 03/02/2013  . Elevated lactic acid level 03/02/2013  . Influenza due to identified novel influenza  A virus with other respiratory manifestations 03/02/2013  . Hyperglycemia 03/02/2013  . Fibromyalgia   . OSA (obstructive sleep apnea) 06/22/2011  . DOE (dyspnea on exertion) 05/30/2011  . Moderate persistent asthma 03/30/2011    Past Medical History:  Diagnosis Date  . Asthma   . Diabetes mellitus without complication ([place])   . Fibromyalgia   . Hyperlipidemia   . OSA (obstructive sleep apnea)   .  Osteoarthritis   . PCOS (polycystic ovarian syndrome)   . Rheumatoid arthritis ([place])     Family History  Problem Relation Age of Onset  . Asthma Mother   . Rheum arthritis Mother   . Allergic rhinitis Mother   . Rheum arthritis Sister   . Allergic rhinitis Sister   . Heart failure Father   . Heart disease Father   . Rheum arthritis Sister   . Allergic rhinitis Sister   . Allergic rhinitis Brother   . Allergies      "Everyone"  . Heart disease Maternal Grandfather   . Heart disease Maternal Grandmother   . Heart disease Paternal Grandfather   . Heart disease Paternal Grandmother    Past Surgical History:  Procedure Laterality Date  . APPENDECTOMY    . CHOLECYSTECTOMY    . COLONOSCOPY W/ BIOPSIES  fall 2009  . COMBINED HYSTERECTOMY VAGINAL / OOPHORECTOMY / A&P REPAIR  10/2002   endometriosis, cystocele, fibroids  . KNEE SURGERY  1996  . VESICOVAGINAL FISTULA CLOSURE W/ TAH     Social History   Social History Narrative  . No narrative on file     Objective: Vital Signs: BP 122/72   Pulse 72   Resp 14   Ht '4\' 11"'$  (1.499 m)   Wt 132 lb (59.9 kg)   BMI 26.66 kg/m    Physical Exam  Constitutional: She is oriented to person, place, and time. She appears well-developed and well-nourished.  HENT:  Head: Normocephalic and atraumatic.  Eyes: EOM are normal. Pupils are equal, round, and reactive to light.  Cardiovascular: Normal rate, regular rhythm and normal heart sounds.  Exam reveals no gallop and no friction rub.   No murmur heard. Pulmonary/Chest: Effort normal and breath sounds normal. She has no wheezes. She has no rales.  Abdominal: Soft. Bowel sounds are normal. She exhibits no distension. There is no tenderness. There is no guarding. No hernia.  Musculoskeletal: Normal range of motion. She exhibits no edema, tenderness or deformity.  Lymphadenopathy:    She has no cervical adenopathy.  Neurological: She is alert and oriented to person, place, and time.  Coordination normal.  Skin: Skin is warm and dry. Capillary refill takes less than 2 seconds. No rash noted.  Psychiatric: She has a normal mood and affect. Her behavior is normal.     Musculoskeletal Exam:  Full range of motion of all joints Grip strength is equal and strong bilaterally Fiber myalgia tender points are 16 out of 18 positive. The only place that she does not have any pain is to bilateral SI joint.  CDAI Exam: CDAI Homunculus Exam:   Joint Counts:  CDAI Tender Joint count: 0 CDAI Swollen Joint count: 0    No synovitis on examination  Investigation: No additional findings.  Orders Only on 01/03/2016  Component Date Value Ref Range Status  . WBC 01/04/2016 12.0* 3.8 - 10.8 K/uL Final  . RBC 01/04/2016 3.93  3.80 - 5.10 MIL/uL Final  . Hemoglobin 01/04/2016 12.0  11.7 - 15.5 g/dL Final  . HCT  01/04/2016 36.1  35.0 - 45.0 % Final  . MCV 01/04/2016 91.9  80.0 - 100.0 fL Final  . MCH 01/04/2016 30.5  27.0 - 33.0 pg Final  . MCHC 01/04/2016 33.2  32.0 - 36.0 g/dL Final  . RDW 01/04/2016 13.8  11.0 - 15.0 % Final  . Platelets 01/04/2016 429* 140 - 400 K/uL Final  . MPV 01/04/2016 11.1  7.5 - 12.5 fL Final  . Neutro Abs 01/04/2016 8040* 1,500 - 7,800 cells/uL Final  . Lymphs Abs 01/04/2016 3240  850 - 3,900 cells/uL Final  . Monocytes Absolute 01/04/2016 600  200 - 950 cells/uL Final  . Eosinophils Absolute 01/04/2016 120  15 - 500 cells/uL Final  . Basophils Absolute 01/04/2016 0  0 - 200 cells/uL Final  . Neutrophils Relative % 01/04/2016 67  % Final  . Lymphocytes Relative 01/04/2016 27  % Final  . Monocytes Relative 01/04/2016 5  % Final  . Eosinophils Relative 01/04/2016 1  % Final  . Basophils Relative 01/04/2016 0  % Final  . Smear Review 01/04/2016 Criteria for review not met   Final  . Sodium 01/04/2016 136  135 - 146 mmol/L Final  . Potassium 01/04/2016 4.2  3.5 - 5.3 mmol/L Final  . Chloride 01/04/2016 103  98 - 110 mmol/L Final  . CO2 01/04/2016 23   20 - 31 mmol/L Final  . Glucose, Bld 01/04/2016 213* 65 - 99 mg/dL Final  . BUN 01/04/2016 22  7 - 25 mg/dL Final  . Creat 01/04/2016 0.78  0.50 - 0.99 mg/dL Final   Comment:   For patients > or = 62 years of age: The upper reference limit for Creatinine is approximately 13% higher for people identified as African-American.     . Total Bilirubin 01/04/2016 0.2  0.2 - 1.2 mg/dL Final  . Alkaline Phosphatase 01/04/2016 55  33 - 130 U/L Final  . AST 01/04/2016 20  10 - 35 U/L Final  . ALT 01/04/2016 22  6 - 29 U/L Final  . Total Protein 01/04/2016 6.3  6.1 - 8.1 g/dL Final  . Albumin 01/04/2016 3.9  3.6 - 5.1 g/dL Final  . Calcium 01/04/2016 9.8  8.6 - 10.4 mg/dL Final  . GFR, Est African American 01/04/2016 >89  >=60 mL/min Final  . GFR, Est Non African American 01/04/2016 82  >=60 mL/min Final    Imaging: No results found.  Speciality Comments: No specialty comments available.    Procedures:  No procedures performed Allergies: Nsaids; Bactrim [sulfamethoxazole-trimethoprim]; Cephalexin; Levofloxacin; and Nitrofurantoin monohyd macro   Assessment / Plan:     Visit Diagnoses: Primary osteoarthritis of both hands  Rheumatoid arthritis with rheumatoid factor of multiple sites without organ or systems involvement ([facility])  HLA B27 (HLA B27 positive)  Primary osteoarthritis of both feet  DJD (degenerative joint disease), cervical  Spondylosis of lumbar region without myelopathy or radiculopathy  Fibromyalgia   Plan: Patient is doing well with her rheumatoid arthritis at this time. She CCP positive H would be 27 positive She is well controlled with methotrexate 6 per week. She wants to go down to 5 per week and I'm agreeable. However I will give her 6 per week for now in case she flares and she needs the higher dose of methotrexate over the winter. If on the next refill she is doing well I'll refill that 5 per week. Patient understands and is agreeable.  For fibromyalgia  patient has not had any daytime muscle relaxer. I'll give  her Robaxin 500 mg 1 twice a day when necessary muscle spasms. I'll give her 60 day supply with 5 refills. This is to be used she has a flare. Note that there is no interaction between Cymbalta and Robaxin. We've given her baclofen in the past and that has not worked well for her.  She continues to have good results with her: colcry and allopurinol for her gout that was occurring in her left great toe. We prescribed her the Colcrys and her PCP has given her the allopurinol.  Her OA of the hands is doing well currently. She does have DIP limits bilaterally.  Patient's labs are within normal limits and November 2017 and she'll be due for repeat labs in approximately 3 months.  Orders: No orders of the defined types were placed in this encounter.  Meds ordered this encounter  Medications  . methocarbamol (ROBAXIN) 500 MG tablet    Sig: Take 1 tablet (500 mg total) by mouth 2 (two) times daily as needed for muscle spasms (8am & 2pm prn).    Dispense:  60 tablet    Refill:  5    Order Specific Question:   Supervising Provider    Answer:   Bo Merino [[id number]]  . methotrexate (RHEUMATREX) 2.5 MG tablet    Sig: Take 6 tablets (15 mg total) by mouth once a week. Caution:Chemotherapy. Protect from light.    Dispense:  72 tablet    Refill:  0    Order Specific Question:   Supervising Provider    Answer:   Bo Merino [phone]    Face-to-face time spent with patient was 30 minutes. 50% of time was spent in counseling and coordination of care.  Follow-Up Instructions: Return in about 5 months (around 06/16/2016).   Eliezer Lofts, PA-C   I examined and evaluated the patient with Eliezer Lofts PA. The plan of care was discussed as noted above.  Bo Merino, MD

## 2016-01-17 ENCOUNTER — Encounter: Payer: Self-pay | Admitting: Rheumatology

## 2016-01-17 ENCOUNTER — Ambulatory Visit (INDEPENDENT_AMBULATORY_CARE_PROVIDER_SITE_OTHER): Payer: PPO | Admitting: Rheumatology

## 2016-01-17 VITALS — BP 122/72 | HR 72 | Resp 14 | Ht 59.0 in | Wt 132.0 lb

## 2016-01-17 DIAGNOSIS — M19072 Primary osteoarthritis, left ankle and foot: Secondary | ICD-10-CM

## 2016-01-17 DIAGNOSIS — M19041 Primary osteoarthritis, right hand: Secondary | ICD-10-CM | POA: Diagnosis not present

## 2016-01-17 DIAGNOSIS — M5030 Other cervical disc degeneration, unspecified cervical region: Secondary | ICD-10-CM

## 2016-01-17 DIAGNOSIS — Z79899 Other long term (current) drug therapy: Secondary | ICD-10-CM

## 2016-01-17 DIAGNOSIS — M47812 Spondylosis without myelopathy or radiculopathy, cervical region: Secondary | ICD-10-CM

## 2016-01-17 DIAGNOSIS — M797 Fibromyalgia: Secondary | ICD-10-CM | POA: Diagnosis not present

## 2016-01-17 DIAGNOSIS — M47816 Spondylosis without myelopathy or radiculopathy, lumbar region: Secondary | ICD-10-CM

## 2016-01-17 DIAGNOSIS — M0579 Rheumatoid arthritis with rheumatoid factor of multiple sites without organ or systems involvement: Secondary | ICD-10-CM

## 2016-01-17 DIAGNOSIS — M19071 Primary osteoarthritis, right ankle and foot: Secondary | ICD-10-CM | POA: Diagnosis not present

## 2016-01-17 DIAGNOSIS — M1A0720 Idiopathic chronic gout, left ankle and foot, without tophus (tophi): Secondary | ICD-10-CM

## 2016-01-17 DIAGNOSIS — Z1589 Genetic susceptibility to other disease: Secondary | ICD-10-CM

## 2016-01-17 DIAGNOSIS — M19042 Primary osteoarthritis, left hand: Secondary | ICD-10-CM

## 2016-01-17 MED ORDER — METHOTREXATE 2.5 MG PO TABS: 15.0000 mg | ORAL_TABLET | ORAL | 0 refills | Status: DC

## 2016-01-17 MED ORDER — METHOCARBAMOL 500 MG PO TABS: 500.0000 mg | ORAL_TABLET | Freq: Two times a day (BID) | ORAL | 5 refills | Status: DC | PRN

## 2016-01-18 LAB — URIC ACID: Uric Acid, Serum: 5 mg/dL (ref 2.5–7.0)

## 2016-01-18 NOTE — Progress Notes (Signed)
Notify patient1: Uric acid is normal. It is in the desirable range at [facility] appointment for 04/17/2016 #2: Continue Colcrys and continue allopurinol as prescribed.#3: Continue to observe good gout diet.#4: Continued to be hydrated. Avoid alcohol.

## 2016-02-13 ENCOUNTER — Other Ambulatory Visit: Payer: Self-pay | Admitting: Rheumatology

## 2016-02-13 NOTE — Telephone Encounter (Signed)
Last Visit: 01/17/16 Next Visit: 06/19/16 Labs: 01/03/16 WBC 12.0, glucose 213  Okay to refill Cymbalta?

## 2016-02-13 NOTE — Telephone Encounter (Signed)
I will refill her Cymbalta.Her labs from 01/03/2016 shows a white count of 12.0.If no fever we can wait until her follow-up visit in March to recheck her CBC.If any new issues, questions or concerns, we can repeat her CBC with differential.Please inform patient.Please send a copy of her labs to her PCP if not already done.

## 2016-03-01 ENCOUNTER — Observation Stay (HOSPITAL_COMMUNITY)
Admission: EM | Admit: 2016-03-01 | Discharge: 2016-03-03 | Disposition: A | Discharge status: Home / Self Care | Payer: PPO | Attending: Internal Medicine | Admitting: Internal Medicine

## 2016-03-01 ENCOUNTER — Encounter (HOSPITAL_COMMUNITY): Payer: Self-pay | Admitting: Emergency Medicine

## 2016-03-01 DIAGNOSIS — E669 Obesity, unspecified: Secondary | ICD-10-CM | POA: Diagnosis not present

## 2016-03-01 DIAGNOSIS — K760 Fatty (change of) liver, not elsewhere classified: Secondary | ICD-10-CM | POA: Diagnosis not present

## 2016-03-01 DIAGNOSIS — Z6827 Body mass index (BMI) 27.0-27.9, adult: Secondary | ICD-10-CM | POA: Diagnosis not present

## 2016-03-01 DIAGNOSIS — A084 Viral intestinal infection, unspecified: Principal | ICD-10-CM | POA: Insufficient documentation

## 2016-03-01 DIAGNOSIS — E119 Type 2 diabetes mellitus without complications: Secondary | ICD-10-CM | POA: Insufficient documentation

## 2016-03-01 DIAGNOSIS — Z8249 Family history of ischemic heart disease and other diseases of the circulatory system: Secondary | ICD-10-CM | POA: Diagnosis not present

## 2016-03-01 DIAGNOSIS — E872 Acidosis, unspecified: Secondary | ICD-10-CM | POA: Diagnosis present

## 2016-03-01 DIAGNOSIS — M19079 Primary osteoarthritis, unspecified ankle and foot: Secondary | ICD-10-CM | POA: Diagnosis not present

## 2016-03-01 DIAGNOSIS — K219 Gastro-esophageal reflux disease without esophagitis: Secondary | ICD-10-CM | POA: Diagnosis not present

## 2016-03-01 DIAGNOSIS — M797 Fibromyalgia: Secondary | ICD-10-CM | POA: Diagnosis not present

## 2016-03-01 DIAGNOSIS — E282 Polycystic ovarian syndrome: Secondary | ICD-10-CM | POA: Insufficient documentation

## 2016-03-01 DIAGNOSIS — M8588 Other specified disorders of bone density and structure, other site: Secondary | ICD-10-CM | POA: Diagnosis not present

## 2016-03-01 DIAGNOSIS — Z90710 Acquired absence of both cervix and uterus: Secondary | ICD-10-CM | POA: Diagnosis not present

## 2016-03-01 DIAGNOSIS — K449 Diaphragmatic hernia without obstruction or gangrene: Secondary | ICD-10-CM | POA: Diagnosis not present

## 2016-03-01 DIAGNOSIS — E860 Dehydration: Secondary | ICD-10-CM | POA: Diagnosis not present

## 2016-03-01 DIAGNOSIS — Z79899 Other long term (current) drug therapy: Secondary | ICD-10-CM | POA: Insufficient documentation

## 2016-03-01 DIAGNOSIS — E785 Hyperlipidemia, unspecified: Secondary | ICD-10-CM | POA: Insufficient documentation

## 2016-03-01 DIAGNOSIS — R109 Unspecified abdominal pain: Secondary | ICD-10-CM | POA: Diagnosis present

## 2016-03-01 DIAGNOSIS — K5730 Diverticulosis of large intestine without perforation or abscess without bleeding: Secondary | ICD-10-CM | POA: Insufficient documentation

## 2016-03-01 DIAGNOSIS — Z825 Family history of asthma and other chronic lower respiratory diseases: Secondary | ICD-10-CM | POA: Insufficient documentation

## 2016-03-01 DIAGNOSIS — M0579 Rheumatoid arthritis with rheumatoid factor of multiple sites without organ or systems involvement: Secondary | ICD-10-CM | POA: Diagnosis not present

## 2016-03-01 DIAGNOSIS — Z7984 Long term (current) use of oral hypoglycemic drugs: Secondary | ICD-10-CM | POA: Insufficient documentation

## 2016-03-01 DIAGNOSIS — Z881 Allergy status to other antibiotic agents status: Secondary | ICD-10-CM | POA: Insufficient documentation

## 2016-03-01 DIAGNOSIS — J45909 Unspecified asthma, uncomplicated: Secondary | ICD-10-CM | POA: Insufficient documentation

## 2016-03-01 DIAGNOSIS — M47816 Spondylosis without myelopathy or radiculopathy, lumbar region: Secondary | ICD-10-CM | POA: Insufficient documentation

## 2016-03-01 DIAGNOSIS — G4733 Obstructive sleep apnea (adult) (pediatric): Secondary | ICD-10-CM | POA: Diagnosis not present

## 2016-03-01 DIAGNOSIS — Z8261 Family history of arthritis: Secondary | ICD-10-CM | POA: Insufficient documentation

## 2016-03-01 DIAGNOSIS — Z9049 Acquired absence of other specified parts of digestive tract: Secondary | ICD-10-CM | POA: Diagnosis not present

## 2016-03-01 DIAGNOSIS — Z882 Allergy status to sulfonamides status: Secondary | ICD-10-CM | POA: Insufficient documentation

## 2016-03-01 DIAGNOSIS — R197 Diarrhea, unspecified: Secondary | ICD-10-CM | POA: Diagnosis not present

## 2016-03-01 DIAGNOSIS — K529 Noninfective gastroenteritis and colitis, unspecified: Secondary | ICD-10-CM | POA: Diagnosis present

## 2016-03-01 DIAGNOSIS — Z888 Allergy status to other drugs, medicaments and biological substances status: Secondary | ICD-10-CM | POA: Insufficient documentation

## 2016-03-01 NOTE — ED Triage Notes (Signed)
Epigastric pain that started this morning with indigestion and diarrhea.  Pt states "I think I have a partial bowel obstruction"

## 2016-03-02 ENCOUNTER — Encounter (HOSPITAL_COMMUNITY): Payer: Self-pay

## 2016-03-02 ENCOUNTER — Emergency Department (HOSPITAL_COMMUNITY): Payer: PPO

## 2016-03-02 DIAGNOSIS — K529 Noninfective gastroenteritis and colitis, unspecified: Secondary | ICD-10-CM

## 2016-03-02 DIAGNOSIS — R109 Unspecified abdominal pain: Secondary | ICD-10-CM | POA: Diagnosis present

## 2016-03-02 DIAGNOSIS — R197 Diarrhea, unspecified: Secondary | ICD-10-CM | POA: Diagnosis not present

## 2016-03-02 LAB — COMPREHENSIVE METABOLIC PANEL
ALBUMIN: 4.5 g/dL (ref 3.5–5.0)
ALT: 26 U/L (ref 14–54)
ANION GAP: 12 (ref 5–15)
AST: 22 U/L (ref 15–41)
Alkaline Phosphatase: 63 U/L (ref 38–126)
BUN: 19 mg/dL (ref 6–20)
CO2: 25 mmol/L (ref 22–32)
Calcium: 9.8 mg/dL (ref 8.9–10.3)
Chloride: 97 mmol/L — ABNORMAL LOW (ref 101–111)
Creatinine, Ser: 0.71 mg/dL (ref 0.44–1.00)
GFR calc Af Amer: >60 mL/min (ref >60)
GFR calc non Af Amer: >60 mL/min (ref >60)
GLUCOSE: 172 mg/dL — AB (ref 65–99)
POTASSIUM: 3.6 mmol/L (ref 3.5–5.1)
SODIUM: 134 mmol/L — AB (ref 135–145)
Total Bilirubin: 0.5 mg/dL (ref 0.3–1.2)
Total Protein: 7.6 g/dL (ref 6.5–8.1)

## 2016-03-02 LAB — GLUCOSE, CAPILLARY
Glucose-Capillary: 121 mg/dL — ABNORMAL HIGH (ref 65–99)
Glucose-Capillary: 86 mg/dL (ref 65–99)
Glucose-Capillary: 129 mg/dL — ABNORMAL HIGH (ref 65–99)
Glucose-Capillary: 94 mg/dL (ref 65–99)

## 2016-03-02 LAB — CBC WITH DIFFERENTIAL/PLATELET
Basophils Absolute: 0 10*3/uL (ref 0.0–0.1)
Basophils Relative: 0 %
Eosinophils Absolute: 0.1 10*3/uL (ref 0.0–0.7)
Eosinophils Relative: 1 %
HCT: 38.5 % (ref 36.0–46.0)
HEMOGLOBIN: 13.7 g/dL (ref 12.0–15.0)
LYMPHS ABS: 3.2 10*3/uL (ref 0.7–4.0)
LYMPHS PCT: 19 %
MCH: 31.9 pg (ref 26.0–34.0)
MCHC: 35.6 g/dL (ref 30.0–36.0)
MCV: 89.5 fL (ref 78.0–100.0)
Monocytes Absolute: 0.8 10*3/uL (ref 0.1–1.0)
Monocytes Relative: 5 %
NEUTROS PCT: 75 %
Neutro Abs: 13.3 10*3/uL — ABNORMAL HIGH (ref 1.7–7.7)
Platelets: 378 10*3/uL (ref 150–400)
RBC: 4.3 MIL/uL (ref 3.87–5.11)
RDW: 13.5 % (ref 11.5–15.5)
WBC: 17.4 10*3/uL — AB (ref 4.0–10.5)

## 2016-03-02 LAB — PROTIME-INR
INR: 0.87
PROTHROMBIN TIME: 11.8 s (ref 11.4–15.2)

## 2016-03-02 LAB — URINALYSIS, ROUTINE W REFLEX MICROSCOPIC
BILIRUBIN URINE: NEGATIVE
GLUCOSE, UA: NEGATIVE mg/dL
Ketones, ur: NEGATIVE mg/dL
LEUKOCYTES UA: NEGATIVE
NITRITE: NEGATIVE
Protein, ur: NEGATIVE mg/dL
SPECIFIC GRAVITY, URINE: 1.008 (ref 1.005–1.030)
pH: 7 (ref 5.0–8.0)

## 2016-03-02 LAB — I-STAT CG4 LACTIC ACID, ED
LACTIC ACID, VENOUS: 2.67 mmol/L — AB (ref 0.5–1.9)
Lactic Acid, Venous: 3.48 mmol/L (ref 0.5–1.9)

## 2016-03-02 MED ORDER — LORATADINE 10 MG PO TABS
10.0000 mg | ORAL_TABLET | Freq: Every evening | ORAL | Status: DC
  Administered 2016-03-02: 10 mg via ORAL
  Filled 2016-03-02: qty 1

## 2016-03-02 MED ORDER — LEVOCETIRIZINE DIHYDROCHLORIDE 5 MG PO TABS: 5.0000 mg | ORAL_TABLET | Freq: Every evening | ORAL | Status: DC

## 2016-03-02 MED ORDER — ALPRAZOLAM 0.5 MG PO TABS
0.5000 mg | ORAL_TABLET | Freq: Two times a day (BID) | ORAL | Status: DC
Start: 2016-03-02 — End: 2016-03-03
  Administered 2016-03-02 – 2016-03-03 (×3): 0.5 mg via ORAL
  Filled 2016-03-02 (×3): qty 1

## 2016-03-02 MED ORDER — SIMVASTATIN 10 MG PO TABS
20.0000 mg | ORAL_TABLET | Freq: Every day | ORAL | Status: DC
  Administered 2016-03-02: 20 mg via ORAL
  Filled 2016-03-02: qty 2

## 2016-03-02 MED ORDER — DICYCLOMINE HCL 20 MG PO TABS: 20.0000 mg | ORAL_TABLET | Freq: Two times a day (BID) | ORAL | 0 refills | Status: DC

## 2016-03-02 MED ORDER — SODIUM CHLORIDE 0.9 % IV BOLUS (SEPSIS)
1000.0000 mL | Freq: Once | INTRAVENOUS | Status: AC
  Administered 2016-03-02: 1000 mL via INTRAVENOUS

## 2016-03-02 MED ORDER — LOPERAMIDE HCL 2 MG PO CAPS: 4.0000 mg | ORAL_CAPSULE | Freq: Every evening | ORAL | 0 refills | Status: DC | PRN

## 2016-03-02 MED ORDER — DULOXETINE HCL 30 MG PO CPEP
60.0000 mg | ORAL_CAPSULE | Freq: Every day | ORAL | Status: DC
  Administered 2016-03-02 – 2016-03-03 (×2): 60 mg via ORAL
  Filled 2016-03-02 (×5): qty 2

## 2016-03-02 MED ORDER — AZELASTINE HCL 0.1 % NA SOLN
2.0000 | Freq: Every day | NASAL | Status: DC
Start: 2016-03-02 — End: 2016-03-03
  Filled 2016-03-02: qty 30

## 2016-03-02 MED ORDER — ONDANSETRON HCL 4 MG PO TABS: 4.0000 mg | ORAL_TABLET | Freq: Four times a day (QID) | ORAL | Status: DC | PRN

## 2016-03-02 MED ORDER — FLUTICASONE PROPIONATE 50 MCG/ACT NA SUSP
2.0000 | Freq: Every day | NASAL | Status: DC
Start: 2016-03-02 — End: 2016-03-03
  Administered 2016-03-02 – 2016-03-03 (×2): 2 via NASAL
  Filled 2016-03-02: qty 16

## 2016-03-02 MED ORDER — METHOCARBAMOL 500 MG PO TABS: 500.0000 mg | ORAL_TABLET | Freq: Two times a day (BID) | ORAL | Status: DC | PRN

## 2016-03-02 MED ORDER — HYDROCODONE-ACETAMINOPHEN 5-325 MG PO TABS
1.0000 | ORAL_TABLET | Freq: Once | ORAL | Status: AC
  Administered 2016-03-02: 1 via ORAL
  Filled 2016-03-02: qty 1

## 2016-03-02 MED ORDER — SODIUM CHLORIDE 0.9 % IV SOLN
INTRAVENOUS | Status: AC
  Administered 2016-03-02: 1000 mL via INTRAVENOUS
  Administered 2016-03-02: 800 mL via INTRAVENOUS
  Administered 2016-03-03: 05:00:00 via INTRAVENOUS

## 2016-03-02 MED ORDER — MONTELUKAST SODIUM 10 MG PO TABS
10.0000 mg | ORAL_TABLET | Freq: Every day | ORAL | Status: DC
  Administered 2016-03-02: 10 mg via ORAL
  Filled 2016-03-02: qty 1

## 2016-03-02 MED ORDER — ALLOPURINOL 300 MG PO TABS
300.0000 mg | ORAL_TABLET | Freq: Every day | ORAL | Status: DC
  Administered 2016-03-02: 300 mg via ORAL
  Filled 2016-03-02 (×2): qty 1

## 2016-03-02 MED ORDER — IOPAMIDOL (ISOVUE-300) INJECTION 61%
100.0000 mL | Freq: Once | INTRAVENOUS | Status: AC | PRN
  Administered 2016-03-02: 100 mL via INTRAVENOUS

## 2016-03-02 MED ORDER — ONDANSETRON HCL 4 MG/2ML IJ SOLN: 4.0000 mg | Freq: Four times a day (QID) | INTRAMUSCULAR | Status: DC | PRN

## 2016-03-02 MED ORDER — SODIUM CHLORIDE 0.9 % IV SOLN: INTRAVENOUS | Status: DC

## 2016-03-02 MED ORDER — PIPERACILLIN-TAZOBACTAM 3.375 G IVPB
3.3750 g | Freq: Three times a day (TID) | INTRAVENOUS | Status: DC
  Administered 2016-03-03 (×2): 3.375 g via INTRAVENOUS
  Filled 2016-03-02 (×11): qty 50

## 2016-03-02 MED ORDER — ONDANSETRON 4 MG PO TBDP: 4.0000 mg | ORAL_TABLET | Freq: Three times a day (TID) | ORAL | 0 refills | Status: DC | PRN

## 2016-03-02 MED ORDER — MORPHINE SULFATE (PF) 4 MG/ML IV SOLN
4.0000 mg | Freq: Once | INTRAVENOUS | Status: AC
  Administered 2016-03-02: 4 mg via INTRAVENOUS
  Filled 2016-03-02: qty 1

## 2016-03-02 MED ORDER — ONDANSETRON 4 MG PO TBDP
4.0000 mg | ORAL_TABLET | Freq: Once | ORAL | Status: AC
  Administered 2016-03-02: 4 mg via ORAL
  Filled 2016-03-02: qty 1

## 2016-03-02 MED ORDER — FENOFIBRATE 160 MG PO TABS
160.0000 mg | ORAL_TABLET | Freq: Every day | ORAL | Status: DC
  Filled 2016-03-02 (×3): qty 1

## 2016-03-02 MED ORDER — PANTOPRAZOLE SODIUM 40 MG PO TBEC
40.0000 mg | DELAYED_RELEASE_TABLET | Freq: Every day | ORAL | Status: DC
  Administered 2016-03-02 – 2016-03-03 (×2): 40 mg via ORAL
  Filled 2016-03-02 (×2): qty 1

## 2016-03-02 MED ORDER — ONDANSETRON HCL 4 MG/2ML IJ SOLN
4.0000 mg | Freq: Once | INTRAMUSCULAR | Status: AC
Start: 2016-03-02 — End: 2016-03-02
  Administered 2016-03-02: 4 mg via INTRAVENOUS
  Filled 2016-03-02: qty 2

## 2016-03-02 MED ORDER — FLUTICASONE FUROATE-VILANTEROL 100-25 MCG/INH IN AEPB
1.0000 | INHALATION_SPRAY | Freq: Every day | RESPIRATORY_TRACT | Status: DC
  Administered 2016-03-02 – 2016-03-03 (×2): 1 via RESPIRATORY_TRACT
  Filled 2016-03-02: qty 28

## 2016-03-02 MED ORDER — METHOTREXATE 2.5 MG PO TABS: 15.0000 mg | ORAL_TABLET | ORAL | Status: DC

## 2016-03-02 MED ORDER — BUDESONIDE 0.5 MG/2ML IN SUSP
0.5000 mg | Freq: Two times a day (BID) | RESPIRATORY_TRACT | Status: DC
  Filled 2016-03-02 (×8): qty 2

## 2016-03-02 MED ORDER — INSULIN ASPART 100 UNIT/ML ~~LOC~~ SOLN
0.0000 [IU] | Freq: Three times a day (TID) | SUBCUTANEOUS | Status: DC
  Administered 2016-03-02 – 2016-03-03 (×2): 1 [IU] via SUBCUTANEOUS

## 2016-03-02 MED ORDER — IPRATROPIUM BROMIDE 0.06 % NA SOLN
2.0000 | Freq: Three times a day (TID) | NASAL | Status: DC
  Administered 2016-03-02: 2 via NASAL
  Filled 2016-03-02: qty 15

## 2016-03-02 MED ORDER — HEPARIN SODIUM (PORCINE) 5000 UNIT/ML IJ SOLN
5000.0000 [IU] | Freq: Three times a day (TID) | INTRAMUSCULAR | Status: DC
  Administered 2016-03-02 – 2016-03-03 (×4): 5000 [IU] via SUBCUTANEOUS
  Filled 2016-03-02 (×4): qty 1

## 2016-03-02 MED ORDER — PIPERACILLIN-TAZOBACTAM 3.375 G IVPB 30 MIN
3.3750 g | Freq: Once | INTRAVENOUS | Status: DC
  Administered 2016-03-02: 3.375 g via INTRAVENOUS
  Filled 2016-03-02: qty 50

## 2016-03-02 MED ORDER — ALBUTEROL SULFATE (2.5 MG/3ML) 0.083% IN NEBU: 2.5000 mg | INHALATION_SOLUTION | RESPIRATORY_TRACT | Status: DC | PRN

## 2016-03-02 MED ORDER — FLUTICASONE FUROATE 200 MCG/ACT IN AEPB: 1.0000 | INHALATION_SPRAY | Freq: Every day | RESPIRATORY_TRACT | Status: DC

## 2016-03-02 MED ORDER — FOLIC ACID 1 MG PO TABS
2.0000 mg | ORAL_TABLET | Freq: Every day | ORAL | Status: DC
  Administered 2016-03-02 – 2016-03-03 (×2): 2 mg via ORAL
  Filled 2016-03-02 (×2): qty 2

## 2016-03-02 MED ORDER — HYDROCODONE-ACETAMINOPHEN 5-325 MG PO TABS
1.0000 | ORAL_TABLET | Freq: Four times a day (QID) | ORAL | Status: DC | PRN
  Administered 2016-03-02: 1 via ORAL
  Filled 2016-03-02: qty 1

## 2016-03-02 NOTE — Care Management Note (Signed)
Case Management Note  Patient Details  Name: LIVIANA RATHORE MRN: [id number] Date of Birth: 1955/12/19  Subjective/Objective:      Patient from home, ind with ADLs. No DME/HH PTA.  Has PCP, transportation, and no issues affording medication.        Action/Plan: No CM needs.    Expected Discharge Date:  03/07/16               Expected Discharge Plan:  Home/Self Care  In-House Referral:  NA  Discharge planning Services  CM Consult  Post Acute Care Choice:  NA Choice offered to:  NA  DME Arranged:    DME Agency:     HH Arranged:    HH Agency:     Status of Service:  Completed, signed off  If discussed at [employer] of Stay Meetings, dates discussed:    Additional Comments:  Ellason Segar, Chauncey Reading, RN 03/02/2016, 9:20 AM

## 2016-03-02 NOTE — H&P (Signed)
TRH H&P   Patient Demographics:    Stephanie Norris, is a 61 y.o. female  MRN: [id number]   DOB - 19-Jan-1956  Admit Date - 03/01/2016  Outpatient Primary MD for the patient is Wende Neighbors, MD  Patient coming from: Home  Chief Complaint  Patient presents with  . Abdominal Pain      HPI:    [name]  is a 61 y.o. female, History of rheumatoid arthritis, PCO S, obesity, fibromyalgia, dyslipidemia, OSA, DM type II, asthma who was doing fairly well but started developing diarrhea with mild nausea and generalized abdominal pain sometime yesterday evening. He came to the ER for the same where she was diagnosed with gastroenteritis with dehydration and elevated lactic acid levels. CT scan of the abdomen pelvis was unremarkable. She was referred for 23 our observation.  Patient denies any fever chills, no chest pain, no cough or shortness of breath, no blood or mucus in stool, did not eat any thing that was bad, no sick contacts, no one else got sick at home either. She is already feeling a whole lot better.    Review of systems:    In addition to the HPI above,   No Fever-chills, No Headache, No changes with Vision or hearing, No problems swallowing food or Liquids, No Chest pain, Cough or Shortness of Breath, Positive mild generalized Abdominal pain, no nausea but 3 loose bowel movements last night , No Blood in stool or Urine, No dysuria, No new skin rashes or bruises, No new joints pains-aches,  No new weakness, tingling, numbness in any extremity, No recent weight gain or loss, No polyuria, polydypsia or polyphagia, No significant Mental Stressors.  A full 10 point Review of Systems was done, except as stated  above, all other Review of Systems were negative.   With Past History of the following :    Past Medical History:  Diagnosis Date  . Asthma   . Diabetes mellitus without complication ([place])   . Fibromyalgia   . Hyperlipidemia   . OSA (obstructive sleep apnea)   . Osteoarthritis   . PCOS (polycystic ovarian syndrome)   . Rheumatoid arthritis [facility])       Past Surgical History:  Procedure Laterality Date  . APPENDECTOMY    . CHOLECYSTECTOMY    . COLONOSCOPY W/ BIOPSIES  fall  2009  . COMBINED HYSTERECTOMY VAGINAL / OOPHORECTOMY / A&P REPAIR  10/2002   endometriosis, cystocele, fibroids  . KNEE SURGERY  1996  . VESICOVAGINAL FISTULA CLOSURE W/ TAH        Social History:     Social History  Substance Use Topics  . Smoking status: Never Smoker  . Smokeless tobacco: Never Used  . Alcohol use No         Family History :     Family History  Problem Relation Age of Onset  . Asthma Mother   . Rheum arthritis Mother   . Allergic rhinitis Mother   . Rheum arthritis Sister   . Allergic rhinitis Sister   . Heart failure Father   . Heart disease Father   . Rheum arthritis Sister   . Allergic rhinitis Sister   . Allergic rhinitis Brother   . Allergies      "Everyone"  . Heart disease Maternal Grandfather   . Heart disease Maternal Grandmother   . Heart disease Paternal Grandfather   . Heart disease Paternal Grandmother        Home Medications:   Prior to Admission medications   Medication Sig Start Date End Date Taking? Authorizing Provider  allopurinol (ZYLOPRIM) 300 MG tablet  10/20/15   Historical Provider, MD  Alpha-Lipoic Acid 200 MG CAPS Take by mouth daily.    Historical Provider, MD  ALPRAZolam Duanne Moron) 0.25 MG tablet  12/30/15   Historical Provider, MD  Azelastine HCl 0.15 % SOLN Place 2 sprays into both nostrils daily. 05/10/15   Adelina Mings, MD  colchicine 0.6 MG tablet  10/14/15   Historical Provider, MD  diclofenac sodium (VOLTAREN) 1 % GEL Apply  topically as needed.    Historical Provider, MD  dicyclomine (BENTYL) 20 MG tablet Take 1 tablet (20 mg total) by mouth 2 (two) times daily. 03/02/16   Varney Biles, MD  DULoxetine (CYMBALTA) 60 MG capsule TAKE 1 CAPSULE BY MOUTH EVERY DAY 02/13/16   Naitik Panwala, PA-C  fenofibrate 160 MG tablet Take 160 mg by mouth daily.    Historical Provider, MD  fluticasone (FLONASE) 50 MCG/ACT nasal spray Place into both nostrils daily.    Historical Provider, MD  Fluticasone Furoate (ARNUITY ELLIPTA) 200 MCG/ACT AEPB Inhale 1 puff into the lungs daily. 12/19/15   Adelina Mings, MD  fluticasone furoate-vilanterol (BREO ELLIPTA) 100-25 MCG/INH AEPB Inhale 1 puff into the lungs daily. 11/02/15   Chesley Mires, MD  folic acid (FOLVITE) 1 MG tablet TAKE TWO TABLETS BY MOUTH ONCE DAILY 12/08/15   Bo Merino, MD  ipratropium (ATROVENT) 0.06 % nasal spray Place 2 sprays into both nostrils 3 (three) times daily. 12/19/15   Adelina Mings, MD  levocetirizine (XYZAL) 5 MG tablet Take 5 mg by mouth every evening.    Historical Provider, MD  loperamide (IMODIUM) 2 MG capsule Take 2 capsules (4 mg total) by mouth at bedtime as needed for diarrhea or loose stools. 03/02/16   Varney Biles, MD  metFORMIN (GLUCOPHAGE-XR) 500 MG 24 hr tablet  01/04/16   Historical Provider, MD  methocarbamol (ROBAXIN) 500 MG tablet Take 1 tablet (500 mg total) by mouth 2 (two) times daily as needed for muscle spasms (8am & 2pm prn). 01/17/16 07/15/16  Naitik Panwala, PA-C  methotrexate (RHEUMATREX) 2.5 MG tablet Take 6 tablets (15 mg total) by mouth once a week. Caution:Chemotherapy. Protect from light. 01/17/16 04/16/16  Naitik Panwala, PA-C  montelukast (SINGULAIR) 10 MG tablet Take 10 mg  by mouth at bedtime.    Historical Provider, MD  Multiple Vitamins-Minerals (MULTIVITAMIN PO) Take by mouth.    Historical Provider, MD  Nutritional Supplements (ESTROVEN PO) Take by mouth.    Historical Provider, MD  Omega-3 Fatty Acids (FISH  OIL) 1000 MG CAPS Take 1 capsule by mouth daily.    Historical Provider, MD  ondansetron (ZOFRAN ODT) 4 MG disintegrating tablet Take 1 tablet (4 mg total) by mouth every 8 (eight) hours as needed for nausea or vomiting. 03/02/16   Varney Biles, MD  ONETOUCH VERIO test strip  01/04/16   Historical Provider, MD  pantoprazole (PROTONIX) 40 MG tablet Take 1 tablet by mouth daily. 07/14/14   Historical Provider, MD  simvastatin (ZOCOR) 20 MG tablet Take 20 mg by mouth daily.    Historical Provider, MD     Allergies:     Allergies  Allergen Reactions  . Nsaids Anaphylaxis  . Bactrim [Sulfamethoxazole-Trimethoprim]     Upset stomach  . Cephalexin     rash  . Levofloxacin     rash  . Nitrofurantoin Monohyd Macro     rash     Physical Exam:   Vitals  Blood pressure 147/89, pulse 89, temperature 97.6 F (36.4 C), temperature source Temporal, resp. rate 18, height 4\' 11"  (1.499 m), weight 61.2 kg (135 lb), SpO2 98 %.   1. General middle aged white female lying in bed in NAD,     2. Normal affect and insight, Not Suicidal or Homicidal, Awake Alert, Oriented X 3.  3. No F.N deficits, ALL C.Nerves Intact, Strength 5/5 all 4 extremities, Sensation intact all 4 extremities, Plantars down going.  4. Ears and Eyes appear Normal, Conjunctivae clear, PERRLA. Moist Oral Mucosa.  5. Supple Neck, No JVD, No cervical lymphadenopathy appriciated, No Carotid Bruits.  6. Symmetrical Chest wall movement, Good air movement bilaterally, CTAB.  7. RRR, No Gallops, Rubs or Murmurs, No Parasternal Heave.  8. Positive Bowel Sounds, Abdomen Soft, mild diffuse ache, No organomegaly appriciated,No rebound -guarding or rigidity.  9.  No Cyanosis, Normal Skin Turgor, No Skin Rash or Bruise.  10. Good muscle tone,  joints appear normal , no effusions, Normal ROM.  11. No Palpable Lymph Nodes in Neck or Axillae      Data Review:    CBC  Recent Labs Lab 03/02/16 0153  WBC 17.4*  HGB 13.7  HCT  38.5  PLT 378  MCV 89.5  MCH 31.9  MCHC 35.6  RDW 13.5  LYMPHSABS 3.2  MONOABS 0.8  EOSABS 0.1  BASOSABS 0.0   ------------------------------------------------------------------------------------------------------------------  Chemistries   Recent Labs Lab 03/02/16 0153  NA 134*  K 3.6  CL 97*  CO2 25  GLUCOSE 172*  BUN 19  CREATININE 0.71  CALCIUM 9.8  AST 22  ALT 26  ALKPHOS 63  BILITOT 0.5   ------------------------------------------------------------------------------------------------------------------ estimated creatinine clearance is 58 mL/min (by C-G formula based on SCr of 0.71 mg/dL). ------------------------------------------------------------------------------------------------------------------ No results for input(s): TSH, T4TOTAL, T3FREE, THYROIDAB in the last 72 hours.  Invalid input(s): FREET3  Coagulation profile  Recent Labs Lab 03/02/16 0153  INR 0.87   ------------------------------------------------------------------------------------------------------------------- No results for input(s): DDIMER in the last 72 hours. -------------------------------------------------------------------------------------------------------------------  Cardiac Enzymes No results for input(s): CKMB, TROPONINI, MYOGLOBIN in the last 168 hours.  Invalid input(s): CK ------------------------------------------------------------------------------------------------------------------ No results found for: BNP   ---------------------------------------------------------------------------------------------------------------  Urinalysis    Component Value Date/Time   COLORURINE STRAW (A) 03/02/2016 [place] 03/02/2016 0159  LABSPEC 1.008 03/02/2016 0159   PHURINE 7.0 03/02/2016 0159   GLUCOSEU NEGATIVE 03/02/2016 0159   HGBUR MODERATE (A) 03/02/2016 0159   BILIRUBINUR NEGATIVE 03/02/2016 0159   BILIRUBINUR neg 03/23/2013 1127   KETONESUR  NEGATIVE 03/02/2016 0159   PROTEINUR NEGATIVE 03/02/2016 0159   UROBILINOGEN negative 03/23/2013 1127   UROBILINOGEN 0.2 03/02/2013 0554   NITRITE NEGATIVE 03/02/2016 0159   LEUKOCYTESUR NEGATIVE 03/02/2016 0159    ----------------------------------------------------------------------------------------------------------------   Imaging Results:    Ct Abdomen Pelvis W Contrast  Result Date: 03/02/2016 CLINICAL DATA:  61 year old female with abdominal pain and nausea. EXAM: CT ABDOMEN AND PELVIS WITH CONTRAST TECHNIQUE: Multidetector CT imaging of the abdomen and pelvis was performed using the standard protocol following bolus administration of intravenous contrast. CONTRAST:  178mL ISOVUE-300 IOPAMIDOL (ISOVUE-300) INJECTION 61% COMPARISON:  Abdominal CT dated 08/09/2011 FINDINGS: Lower chest: The visualized lung bases are clear. No intra-abdominal free air or free fluid. Hepatobiliary: Apparent mild fatty infiltration of the liver. No intrahepatic biliary ductal dilatation. Cholecystectomy. Pancreas: Unremarkable. No pancreatic ductal dilatation or surrounding inflammatory changes. Spleen: Normal in size without focal abnormality. Adrenals/Urinary Tract: The adrenal glands are unremarkable. Subcentimeter bilateral renal hypodense lesions are too small to characterize but most likely represent cysts. There is no hydronephrosis on either side. The visualized ureters and urinary bladder appear unremarkable. Stomach/Bowel: Small hiatal hernia with gastroesophageal reflux. Loose stool noted within the colon compatible with diarrheal state. Correlation with clinical exam and stool cultures recommended. There are scattered sigmoid diverticula without active inflammatory changes. There is no evidence of bowel obstruction or active inflammation. Appendectomy. Vascular/Lymphatic: Mild aortoiliac atherosclerotic disease. The origins of the celiac axis, SMA, IMA as well as the origins of the renal arteries are  patent. The SMV, splenic vein, and main portal vein are patent. No portal venous gas identified. There is no adenopathy. Reproductive: Hysterectomy. Other: None Musculoskeletal: Osteopenia with mild degenerative changes of the spine. No acute fracture. IMPRESSION: Diarrheal state. Correlation with clinical exam and stool cultures recommended. No other acute intra-abdominopelvic pathology identified. Sigmoid diverticulosis. Apparent mild fatty liver. Electronically Signed   By: Anner Crete M.D.   On: 03/02/2016 03:51        Assessment & Plan:     1. Gastroenteritis causing dehydration and elevated lactic acid levels - her symptoms are much improved, lactate is improving with hydration, she is nontoxic appearing, do not think she has sepsis. We will give her bowel rest with clear liquid diet, IV fluid bolus and maintenance, supportive care. This likely is while discharge in the morning.  2. Rheumatoid arthritis. Stable continue home medications unchanged.  3. Asthma - no acute issues continue supportive care.  4. Dyslipidemia. Continue statin.  5. GERD. On PPI continue.   6. DM type II. 4 no sliding scale insulin check A1c.    DVT Prophylaxis Heparin    AM Labs Ordered, also please review Full Orders  Family Communication: Admission, patients condition and plan of care including tests being ordered have been discussed with the patient  who indicates understanding and agree with the plan and Code Status.  Code Status Full  Likely DC to  Home in am  Condition Fair  Consults called: None    Admission status: Obs    Time spent in minutes : 30   Lala Lund K M.D on 03/02/2016 at 7:52 AM  Between 7am to 7pm - Pager - [phone]. After 7pm go to [email or web address] - password [email or web address]  [facility] - Office  [phone]

## 2016-03-02 NOTE — Care Management Obs Status (Signed)
[place] NOTIFICATION   Patient Details  Name: Stephanie Norris MRN: [id number] Date of Birth: Sep 12, 1955   Medicare Observation Status Notification Given:  Yes    Malayia Spizzirri, Chauncey Reading, RN 03/02/2016, 9:21 AM

## 2016-03-02 NOTE — ED Notes (Signed)
Pt aware urine specimen is needed. 

## 2016-03-02 NOTE — ED Provider Notes (Signed)
[facility] Provider Note   CSN: [id number] Arrival date & time: 03/01/16  2213  By signing my name below, I, Hilbert Odor, attest that this documentation has been prepared under the direction and in the presence of Varney Biles, MD. Electronically Signed: Hilbert Odor, Scribe. 03/02/16. 1:16 AM. History   Chief Complaint Chief Complaint  Patient presents with  . Abdominal Pain    The history is provided by the patient. No language interpreter was used.    HPI Comments: Stephanie Norris is a 61 y.o. female who presents to the Emergency Department complaining of constant, dull, abdominal pain that began early this morning. She reports belching which tastes like acid. She reports having diarrhea x3 today. She denies nausea. She denies hematuria and loss of appetite. She states that she took magnesium citrate today because of having bowel problems and later still had a watery BM. She denies having a small bowel obstruction in the past.  Past Medical History:  Diagnosis Date  . Asthma   . Diabetes mellitus without complication ([place])   . Fibromyalgia   . Hyperlipidemia   . OSA (obstructive sleep apnea)   . Osteoarthritis   . PCOS (polycystic ovarian syndrome)   . Rheumatoid arthritis [facility])     Patient Active Problem List   Diagnosis Date Noted  . Abdominal pain 03/02/2016  . Gastroenteritis 03/02/2016  . Rheumatoid arthritis with rheumatoid factor of multiple sites without organ or systems involvement ([place]) 01/13/2016  . HLA B27 (HLA B27 positive) 01/13/2016  . Osteoarthritis of foot 01/13/2016  . DJD (degenerative joint disease), cervical 01/13/2016  . Spondylosis of lumbar region without myelopathy or radiculopathy 01/13/2016  . Primary osteoarthritis of both hands 01/13/2016  . Perennial allergic rhinosinusitis with a nonallergic component 08/13/2014  . Stiffness of joints, not elsewhere classified, multiple sites 08/26/2013  . Flu-like symptoms  03/02/2013  . Lactic acidosis 03/02/2013  . Sinus tachycardia 03/02/2013  . Elevated lactic acid level 03/02/2013  . Influenza due to identified novel influenza A virus with other respiratory manifestations 03/02/2013  . Hyperglycemia 03/02/2013  . Fibromyalgia   . OSA (obstructive sleep apnea) 06/22/2011  . DOE (dyspnea on exertion) 05/30/2011  . Moderate persistent asthma 03/30/2011    Past Surgical History:  Procedure Laterality Date  . APPENDECTOMY    . CHOLECYSTECTOMY    . COLONOSCOPY W/ BIOPSIES  fall 2009  . COMBINED HYSTERECTOMY VAGINAL / OOPHORECTOMY / A&P REPAIR  10/2002   endometriosis, cystocele, fibroids  . KNEE SURGERY  1996  . VESICOVAGINAL FISTULA CLOSURE W/ TAH      OB History    Gravida Para Term Preterm AB Living   1       1 0   SAB TAB Ectopic Multiple Live Births                   Home Medications    Prior to Admission medications   Medication Sig Start Date End Date Taking? Authorizing Provider  Alpha-Lipoic Acid 200 MG CAPS Take 1 capsule by mouth at bedtime.    Yes Historical Provider, MD  ALPRAZolam (XANAX) 0.25 MG tablet Take 0.25 mg by mouth at bedtime.  12/30/15  Yes Historical Provider, MD  diclofenac sodium (VOLTAREN) 1 % GEL Apply 2 g topically daily as needed (pain).    Yes Historical Provider, MD  DULoxetine (CYMBALTA) 60 MG capsule TAKE 1 CAPSULE BY MOUTH EVERY DAY 02/13/16  Yes Naitik Panwala, PA-C  fluticasone (FLONASE) 50 MCG/ACT nasal spray  Place 1 spray into both nostrils daily.    Yes Historical Provider, MD  fluticasone furoate-vilanterol (BREO ELLIPTA) 100-25 MCG/INH AEPB Inhale 1 puff into the lungs daily. 11/02/15  Yes Chesley Mires, MD  folic acid (FOLVITE) 1 MG tablet TAKE TWO TABLETS BY MOUTH ONCE DAILY 12/08/15  Yes Bo Merino, MD  ipratropium (ATROVENT) 0.06 % nasal spray Place 2 sprays into both nostrils 3 (three) times daily. 12/19/15  Yes Adelina Mings, MD  levocetirizine (XYZAL) 5 MG tablet Take 5 mg by mouth every  evening.   Yes Historical Provider, MD  metFORMIN (GLUCOPHAGE-XR) 500 MG 24 hr tablet Take 1,000 mg by mouth 2 (two) times daily.  01/04/16  Yes Historical Provider, MD  methocarbamol (ROBAXIN) 500 MG tablet Take 1 tablet (500 mg total) by mouth 2 (two) times daily as needed for muscle spasms (8am & 2pm prn). 01/17/16 07/15/16 Yes Naitik Panwala, PA-C  methotrexate (RHEUMATREX) 2.5 MG tablet Take 6 tablets (15 mg total) by mouth once a week. Caution:Chemotherapy. Protect from light. Patient taking differently: Take 15 mg by mouth once a week. Caution:Chemotherapy. Protect from light. Take on Wednesday. 01/17/16 04/16/16 Yes Naitik Panwala, PA-C  montelukast (SINGULAIR) 10 MG tablet Take 10 mg by mouth at bedtime.   Yes Historical Provider, MD  Multiple Vitamins-Minerals (MULTIVITAMIN PO) Take by mouth.   Yes Historical Provider, MD  Nutritional Supplements (ESTROVEN PO) Take 1 tablet by mouth daily.    Yes Historical Provider, MD  Omega-3 Fatty Acids (FISH OIL) 1000 MG CAPS Take 1 capsule by mouth daily.   Yes Historical Provider, MD  pantoprazole (PROTONIX) 40 MG tablet Take 1 tablet by mouth 2 (two) times daily.  07/14/14  Yes Historical Provider, MD  dicyclomine (BENTYL) 20 MG tablet Take 1 tablet (20 mg total) by mouth 2 (two) times daily. 03/02/16   Varney Biles, MD  loperamide (IMODIUM) 2 MG capsule Take 2 capsules (4 mg total) by mouth at bedtime as needed for diarrhea or loose stools. 03/02/16   Varney Biles, MD  ondansetron (ZOFRAN ODT) 4 MG disintegrating tablet Take 1 tablet (4 mg total) by mouth every 8 (eight) hours as needed for nausea or vomiting. 03/02/16   Varney Biles, MD  ONETOUCH VERIO test strip  01/04/16   Historical Provider, MD    Family History Family History  Problem Relation Age of Onset  . Asthma Mother   . Rheum arthritis Mother   . Allergic rhinitis Mother   . Rheum arthritis Sister   . Allergic rhinitis Sister   . Heart failure Father   . Heart disease Father     . Rheum arthritis Sister   . Allergic rhinitis Sister   . Allergic rhinitis Brother   . Allergies      "Everyone"  . Heart disease Maternal Grandfather   . Heart disease Maternal Grandmother   . Heart disease Paternal Grandfather   . Heart disease Paternal Grandmother     Social History Social History  Substance Use Topics  . Smoking status: Never Smoker  . Smokeless tobacco: Never Used  . Alcohol use No     Allergies   Nsaids; Bactrim [sulfamethoxazole-trimethoprim]; Cephalexin; Levofloxacin; and Nitrofurantoin monohyd macro   Review of Systems Review of Systems  Constitutional: Negative for appetite change.  Gastrointestinal: Positive for abdominal pain, diarrhea and vomiting. Negative for nausea.  Genitourinary: Negative for hematuria.  All other systems reviewed and are negative.    Physical Exam Updated Vital Signs BP 129/78 (BP Location: Left Arm)  Pulse 73   Temp 98.1 F (36.7 C) (Oral)   Resp 20   Ht 4' 11" (1.499 m)   Wt 135 lb (61.2 kg)   SpO2 96%   BMI 27.27 kg/m   Physical Exam  Constitutional: She is oriented to person, place, and time. She appears well-developed and well-nourished.  HENT:  Head: Normocephalic.  Mouth/Throat: Oropharynx is clear and moist and mucous membranes are normal.  Eyes: Conjunctivae and EOM are normal. No scleral icterus.  Neck: Normal range of motion.  Pulmonary/Chest: Effort normal.  Abdominal: She exhibits no distension.  Generalized tenderness with guarding that is worse at epigastrium. Hyperactive bowel sounds.  Musculoskeletal: Normal range of motion.  Neurological: She is alert and oriented to person, place, and time.  Skin: Skin is warm.  Skin is warm to touch.  Psychiatric: She has a normal mood and affect.  Nursing note and vitals reviewed.    ED Treatments / Results  DIAGNOSTIC STUDIES: Oxygen Saturation is 100% on RA, normal by my interpretation.    COORDINATION OF CARE: 12:57 AM Discussed  treatment plan with pt at bedside, which includes CT of Abdomen, and pt agreed to plan.  Labs (all labs ordered are listed, but only abnormal results are displayed) Labs Reviewed  COMPREHENSIVE METABOLIC PANEL - Abnormal; Notable for the following:       Result Value   Sodium 134 (*)    Chloride 97 (*)    Glucose, Bld 172 (*)    All other components within normal limits  CBC WITH DIFFERENTIAL/PLATELET - Abnormal; Notable for the following:    WBC 17.4 (*)    Neutro Abs 13.3 (*)    All other components within normal limits  URINALYSIS, ROUTINE W REFLEX MICROSCOPIC - Abnormal; Notable for the following:    Color, Urine STRAW (*)    Hgb urine dipstick MODERATE (*)    Bacteria, UA MANY (*)    All other components within normal limits  HEMOGLOBIN A1C - Abnormal; Notable for the following:    Hgb A1c MFr Bld 6.7 (*)    All other components within normal limits  GLUCOSE, CAPILLARY - Abnormal; Notable for the following:    Glucose-Capillary 121 (*)    All other components within normal limits  GLUCOSE, CAPILLARY - Abnormal; Notable for the following:    Glucose-Capillary 129 (*)    All other components within normal limits  I-STAT CG4 LACTIC ACID, ED - Abnormal; Notable for the following:    Lactic Acid, Venous 3.48 (*)    All other components within normal limits  I-STAT CG4 LACTIC ACID, ED - Abnormal; Notable for the following:    Lactic Acid, Venous 2.67 (*)    All other components within normal limits  CULTURE, BLOOD (ROUTINE X 2)  CULTURE, BLOOD (ROUTINE X 2)  URINE CULTURE  PROTIME-INR  GLUCOSE, CAPILLARY  GLUCOSE, CAPILLARY  LACTIC ACID, PLASMA  BASIC METABOLIC PANEL  CBC  I-STAT CG4 LACTIC ACID, ED    EKG  EKG Interpretation None       Radiology Ct Abdomen Pelvis W Contrast  Result Date: 03/02/2016 CLINICAL DATA:  61 year old female with abdominal pain and nausea. EXAM: CT ABDOMEN AND PELVIS WITH CONTRAST TECHNIQUE: Multidetector CT imaging of the abdomen and  pelvis was performed using the standard protocol following bolus administration of intravenous contrast. CONTRAST:  141m ISOVUE-300 IOPAMIDOL (ISOVUE-300) INJECTION 61% COMPARISON:  Abdominal CT dated 08/09/2011 FINDINGS: Lower chest: The visualized lung bases are clear. No intra-abdominal free air or  free fluid. Hepatobiliary: Apparent mild fatty infiltration of the liver. No intrahepatic biliary ductal dilatation. Cholecystectomy. Pancreas: Unremarkable. No pancreatic ductal dilatation or surrounding inflammatory changes. Spleen: Normal in size without focal abnormality. Adrenals/Urinary Tract: The adrenal glands are unremarkable. Subcentimeter bilateral renal hypodense lesions are too small to characterize but most likely represent cysts. There is no hydronephrosis on either side. The visualized ureters and urinary bladder appear unremarkable. Stomach/Bowel: Small hiatal hernia with gastroesophageal reflux. Loose stool noted within the colon compatible with diarrheal state. Correlation with clinical exam and stool cultures recommended. There are scattered sigmoid diverticula without active inflammatory changes. There is no evidence of bowel obstruction or active inflammation. Appendectomy. Vascular/Lymphatic: Mild aortoiliac atherosclerotic disease. The origins of the celiac axis, SMA, IMA as well as the origins of the renal arteries are patent. The SMV, splenic vein, and main portal vein are patent. No portal venous gas identified. There is no adenopathy. Reproductive: Hysterectomy. Other: None Musculoskeletal: Osteopenia with mild degenerative changes of the spine. No acute fracture. IMPRESSION: Diarrheal state. Correlation with clinical exam and stool cultures recommended. No other acute intra-abdominopelvic pathology identified. Sigmoid diverticulosis. Apparent mild fatty liver. Electronically Signed   By: Anner Crete M.D.   On: 03/02/2016 03:51    Procedures Procedures (including critical care  time)  Medications Ordered in ED Medications  sodium chloride 0.9 % bolus 1,000 mL (0 mLs Intravenous Stopped 03/02/16 0602)    And  0.9 %  sodium chloride infusion ( Intravenous Rate/Dose Verify 03/03/16 0600)  montelukast (SINGULAIR) tablet 10 mg (10 mg Oral Given 03/02/16 2159)  pantoprazole (PROTONIX) EC tablet 40 mg (40 mg Oral Given 03/02/16 0850)  azelastine (ASTELIN) 0.1 % nasal spray 2 spray (2 sprays Each Nare Not Given 03/02/16 1519)  fenofibrate tablet 160 mg (not administered)  simvastatin (ZOCOR) tablet 20 mg (20 mg Oral Given 03/02/16 2159)  fluticasone (FLONASE) 50 MCG/ACT nasal spray 2 spray (2 sprays Each Nare Given 03/02/16 0850)  fluticasone furoate-vilanterol (BREO ELLIPTA) 100-25 MCG/INH 1 puff (1 puff Inhalation Given 09/30/03 3976)  folic acid (FOLVITE) tablet 2 mg (2 mg Oral Given 03/02/16 0849)  ipratropium (ATROVENT) 0.06 % nasal spray 2 spray (2 sprays Each Nare Not Given 03/02/16 1532)  allopurinol (ZYLOPRIM) tablet 300 mg (300 mg Oral Given 03/02/16 0850)  ALPRAZolam (XANAX) tablet 0.5 mg (0.5 mg Oral Given 03/02/16 2200)  methocarbamol (ROBAXIN) tablet 500 mg (not administered)  methotrexate (RHEUMATREX) tablet 15 mg (not administered)  DULoxetine (CYMBALTA) DR capsule 60 mg (60 mg Oral Given 03/02/16 1051)  insulin aspart (novoLOG) injection 0-9 Units (0 Units Subcutaneous Not Given 03/02/16 1700)  albuterol (PROVENTIL) (2.5 MG/3ML) 0.083% nebulizer solution 2.5 mg (not administered)  HYDROcodone-acetaminophen (NORCO/VICODIN) 5-325 MG per tablet 1 tablet (1 tablet Oral Given 03/02/16 1615)  ondansetron (ZOFRAN) tablet 4 mg (not administered)    Or  ondansetron (ZOFRAN) injection 4 mg (not administered)  heparin injection 5,000 Units (5,000 Units Subcutaneous Given 03/03/16 0515)  loratadine (CLARITIN) tablet 10 mg (10 mg Oral Given 03/02/16 2200)  budesonide (PULMICORT) nebulizer solution 0.5 mg (0.5 mg Nebulization Not Given 03/02/16 2040)  piperacillin-tazobactam (ZOSYN)  IVPB 3.375 g (3.375 g Intravenous Given 03/03/16 0052)  morphine 4 MG/ML injection 4 mg (4 mg Intravenous Given 03/02/16 0140)  ondansetron (ZOFRAN) injection 4 mg (4 mg Intravenous Given 03/02/16 0140)  iopamidol (ISOVUE-300) 61 % injection 100 mL (100 mLs Intravenous Contrast Given 03/02/16 0315)  ondansetron (ZOFRAN-ODT) disintegrating tablet 4 mg (4 mg Oral Given 03/02/16 0551)  HYDROcodone-acetaminophen (NORCO/VICODIN)  5-325 MG per tablet 1 tablet (1 tablet Oral Given 03/02/16 0551)  sodium chloride 0.9 % bolus 1,000 mL (1,000 mLs Intravenous New Bag/Given 03/02/16 0754)     Initial Impression / Assessment and Plan / ED Course  I have reviewed the triage vital signs and the nursing notes.  Pertinent labs & imaging results that were available during my care of the patient were reviewed by me and considered in my medical decision making (see chart for details).     Pt comes in with cc orf abd pain, diarrhea and burping. Hx of abd surgeries, and pt has significnat tenderness to palpation - so we were concerned about SBO  Initially. CT scan done and r/o SBO, Pt is noted to have colitis.  Pt has RA and is on methotrexate, she is also diabetic. Lactate > 3. Plan is to hydrate, admit.  Final Clinical Impressions(s) / ED Diagnoses   Final diagnoses:  Gastroenteritis and colitis, viral    New Prescriptions Current Discharge Medication List    START taking these medications   Details  dicyclomine (BENTYL) 20 MG tablet Take 1 tablet (20 mg total) by mouth 2 (two) times daily. Qty: 20 tablet, Refills: 0    loperamide (IMODIUM) 2 MG capsule Take 2 capsules (4 mg total) by mouth at bedtime as needed for diarrhea or loose stools. Qty: 14 capsule, Refills: 0    ondansetron (ZOFRAN ODT) 4 MG disintegrating tablet Take 1 tablet (4 mg total) by mouth every 8 (eight) hours as needed for nausea or vomiting. Qty: 20 tablet, Refills: 0      I personally performed the services described in this  documentation, which was scribed in my presence. The recorded information has been reviewed and is accurate.     Varney Biles, MD 03/03/16 [phone]

## 2016-03-02 NOTE — Progress Notes (Signed)
Pharmacy Antibiotic Note  Stephanie Norris is a 61 y.o. female admitted on 03/01/2016 with intra-abdominal infection.  Pharmacy has been consulted for ZOSYN dosing.  Plan: Zosyn 3.375g IV q8h (4 hour infusion).  Monitor labs, progress, c/s  Height: 4\' 11"  (149.9 cm) Weight: 135 lb (61.2 kg) IBW/kg (Calculated) : 43.2  Temp (24hrs), Avg:97.9 F (36.6 C), Min:97.6 F (36.4 C), Max:98.2 F (36.8 C)   Recent Labs Lab 03/02/16 0153 03/02/16 0200 03/02/16 0658  WBC 17.4*  --   --   CREATININE 0.71  --   --   LATICACIDVEN  --  3.48* 2.67*    Estimated Creatinine Clearance: 58 mL/min (by C-G formula based on SCr of 0.71 mg/dL).    Allergies  Allergen Reactions  . Nsaids Anaphylaxis  . Bactrim [Sulfamethoxazole-Trimethoprim]     Upset stomach  . Cephalexin     rash  . Levofloxacin     rash  . Nitrofurantoin Monohyd Macro     rash    Antimicrobials this admission: Zosyn 1/26 >>   Dose adjustments this admission:  Microbiology results:  BCx: pending  UCx: pending   Sputum:    MRSA PCR:   Thank you for allowing pharmacy to be a part of this patient's care.  Hart Robinsons A 03/02/2016 12:25 PM

## 2016-03-03 DIAGNOSIS — K529 Noninfective gastroenteritis and colitis, unspecified: Secondary | ICD-10-CM | POA: Diagnosis not present

## 2016-03-03 LAB — CBC
HEMATOCRIT: 35 % — AB (ref 36.0–46.0)
HEMOGLOBIN: 12.3 g/dL (ref 12.0–15.0)
MCH: 32.2 pg (ref 26.0–34.0)
MCHC: 35.1 g/dL (ref 30.0–36.0)
MCV: 91.6 fL (ref 78.0–100.0)
Platelets: 313 10*3/uL (ref 150–400)
RBC: 3.82 MIL/uL — ABNORMAL LOW (ref 3.87–5.11)
RDW: 13.8 % (ref 11.5–15.5)
WBC: 7.7 10*3/uL (ref 4.0–10.5)

## 2016-03-03 LAB — BASIC METABOLIC PANEL
ANION GAP: 8 (ref 5–15)
BUN: 8 mg/dL (ref 6–20)
CALCIUM: 8.7 mg/dL — AB (ref 8.9–10.3)
CO2: 25 mmol/L (ref 22–32)
Chloride: 106 mmol/L (ref 101–111)
Creatinine, Ser: 0.73 mg/dL (ref 0.44–1.00)
GFR calc Af Amer: >60 mL/min (ref >60)
GLUCOSE: 148 mg/dL — AB (ref 65–99)
POTASSIUM: 3.7 mmol/L (ref 3.5–5.1)
SODIUM: 139 mmol/L (ref 135–145)

## 2016-03-03 LAB — LACTIC ACID, PLASMA: LACTIC ACID, VENOUS: 1.6 mmol/L (ref 0.5–1.9)

## 2016-03-03 LAB — HEMOGLOBIN A1C
HEMOGLOBIN A1C: 6.7 % — AB (ref 4.8–5.6)
Mean Plasma Glucose: 146 mg/dL

## 2016-03-03 LAB — GLUCOSE, CAPILLARY
GLUCOSE-CAPILLARY: 138 mg/dL — AB (ref 65–99)
Glucose-Capillary: 141 mg/dL — ABNORMAL HIGH (ref 65–99)

## 2016-03-03 NOTE — Discharge Summary (Addendum)
Stephanie Norris [id number] DOB: [birth year] DOA: 03/01/2016  PCP: Wende Neighbors, MD  Admit date: 03/01/2016  Discharge date: 03/03/2016  Admitted From: Home  Disposition:  Home   Recommendations for Outpatient Follow-up:   Follow up with PCP in 1-2 weeks  PCP Please obtain BMP/CBC, 2 view CXR in 1week,  (see Discharge instructions)   PCP Please follow up on the following pending results: None   Home Health: None   Equipment/Devices: None  Consultations: None Discharge Condition: Stable  CODE STATUS: Full Diet Recommendation: DIET SOFT     Chief Complaint  Patient presents with  . Abdominal Pain     Brief history of present illness from the day of admission and additional interim summary    Stephanie Norris  is a 61 y.o. female, History of rheumatoid arthritis, PCO S, obesity, fibromyalgia, dyslipidemia, OSA, DM type II, asthma who was doing fairly well but started developing diarrhea with mild nausea and generalized abdominal pain sometime yesterday evening. He came to the ER for the same where she was diagnosed with gastroenteritis with dehydration and elevated lactic acid levels. CT scan of the abdomen pelvis was unremarkable. She was referred for 23 our observation.                                                                 Hospital Course   1. Viral Gastroenteritis causing dehydration and elevated lactic acid levels - there was no sepsis. She was treated conservatively with bowel rest, IV fluids, next morning she is completely symptom free, nobowel movements in 20 hours, no abdominal pain, labs including lactic acid normal will be discharged home with outpatient PCP and GI follow-up.  2. Rheumatoid arthritis. Stable continue home medications unchanged.  3. Asthma - no acute issues continue supportive  care.  4. Dyslipidemia. Continue statin.  5. GERD. On PPI continue.   6. DM type II. Continue home regimen follow with PCP for glycemic control.  Lab Results  Component Value Date   HGBA1C 6.7 (H) 03/02/2016    Discharge diagnosis     Principal Problem:   Gastroenteritis Active Problems:   OSA (obstructive sleep apnea)   Lactic acidosis   Rheumatoid arthritis with rheumatoid factor of multiple sites without organ or systems involvement (HCC)   Abdominal pain    Discharge instructions    Discharge Instructions    Diet - low sodium heart healthy    Complete by:  As directed    Discharge instructions    Complete by:  As directed    Follow with Primary MD Wende Neighbors, MD in 7 days   Get CBC, CMP, 2 view Chest X ray checked  by Primary MD or SNF MD in 5-7 days ( we routinely change or add medications that can affect your baseline labs  and fluid status, therefore we recommend that you get the mentioned basic workup next visit with your PCP, your PCP may decide not to get them or add new tests based on their clinical decision)   Activity: As tolerated with Full fall precautions use walker/cane & assistance as needed   Disposition Home     Diet:   DIET SOFT Room    For Heart failure patients - Check your Weight same time everyday, if you gain over 2 pounds, or you develop in leg swelling, experience more shortness of breath or chest pain, call your Primary MD immediately. Follow Cardiac Low Salt Diet and 1.5 lit/day fluid restriction.   On your next visit with your primary care physician please Get Medicines reviewed and adjusted.   Please request your Prim.MD to go over all Hospital Tests and Procedure/Radiological results at the follow up, please get all Hospital records sent to your Prim MD by signing hospital release before you go home.   If you experience worsening of your admission symptoms, develop shortness of breath, life threatening emergency, suicidal or  homicidal thoughts you must seek medical attention immediately by calling 911 or calling your MD immediately  if symptoms less severe.  You Must read complete instructions/literature along with all the possible adverse reactions/side effects for all the Medicines you take and that have been prescribed to you. Take any new Medicines after you have completely understood and accpet all the possible adverse reactions/side effects.   Do not drive, operate heavy machinery, perform activities at heights, swimming or participation in water activities or provide baby sitting services if your were admitted for syncope or siezures until you have seen by Primary MD or a Neurologist and advised to do so again.  Do not drive when taking Pain medications.    Do not take more than prescribed Pain, Sleep and Anxiety Medications  Special Instructions: If you have smoked or chewed Tobacco  in the last 2 yrs please stop smoking, stop any regular Alcohol  and or any Recreational drug use.  Wear Seat belts while driving.   Please note  You were cared for by a hospitalist during your hospital stay. If you have any questions about your discharge medications or the care you received while you were in the hospital after you are discharged, you can call the unit and asked to speak with the hospitalist on call if the hospitalist that took care of you is not available. Once you are discharged, your primary care physician will handle any further medical issues. Please note that NO REFILLS for any discharge medications will be authorized once you are discharged, as it is imperative that you return to your primary care physician (or establish a relationship with a primary care physician if you do not have one) for your aftercare needs so that they can reassess your need for medications and monitor your lab values.   Increase activity slowly    Complete by:  As directed       Discharge Medications   Allergies as of  03/03/2016      Reactions   Nsaids Anaphylaxis   Bactrim [sulfamethoxazole-trimethoprim]    Upset stomach   Cephalexin    rash   Levofloxacin    rash   Nitrofurantoin Monohyd Macro    rash      Medication List    TAKE these medications   Alpha-Lipoic Acid 200 MG Caps Take 1 capsule by mouth at bedtime.   ALPRAZolam 0.25 MG tablet  Commonly known as:  XANAX Take 0.25 mg by mouth at bedtime.   dicyclomine 20 MG tablet Commonly known as:  BENTYL Take 1 tablet (20 mg total) by mouth 2 (two) times daily.   DULoxetine 60 MG capsule Commonly known as:  CYMBALTA TAKE 1 CAPSULE BY MOUTH EVERY DAY   ESTROVEN PO Take 1 tablet by mouth daily.   Fish Oil 1000 MG Caps Take 1 capsule by mouth daily.   fluticasone 50 MCG/ACT nasal spray Commonly known as:  FLONASE Place 1 spray into both nostrils daily.   fluticasone furoate-vilanterol 100-25 MCG/INH Aepb Commonly known as:  BREO ELLIPTA Inhale 1 puff into the lungs daily.   folic acid 1 MG tablet Commonly known as:  FOLVITE TAKE TWO TABLETS BY MOUTH ONCE DAILY   ipratropium 0.06 % nasal spray Commonly known as:  ATROVENT Place 2 sprays into both nostrils 3 (three) times daily.   levocetirizine 5 MG tablet Commonly known as:  XYZAL Take 5 mg by mouth every evening.   loperamide 2 MG capsule Commonly known as:  IMODIUM Take 2 capsules (4 mg total) by mouth at bedtime as needed for diarrhea or loose stools.   metFORMIN 500 MG 24 hr tablet Commonly known as:  GLUCOPHAGE-XR Take 1,000 mg by mouth 2 (two) times daily.   methocarbamol 500 MG tablet Commonly known as:  ROBAXIN Take 1 tablet (500 mg total) by mouth 2 (two) times daily as needed for muscle spasms (8am & 2pm prn).   methotrexate 2.5 MG tablet Commonly known as:  RHEUMATREX Take 6 tablets (15 mg total) by mouth once a week. Caution:Chemotherapy. Protect from light. What changed:  additional instructions   montelukast 10 MG tablet Commonly known as:   SINGULAIR Take 10 mg by mouth at bedtime.   MULTIVITAMIN PO Take by mouth.   ondansetron 4 MG disintegrating tablet Commonly known as:  ZOFRAN ODT Take 1 tablet (4 mg total) by mouth every 8 (eight) hours as needed for nausea or vomiting.   ONETOUCH VERIO test strip Generic drug:  glucose blood   pantoprazole 40 MG tablet Commonly known as:  PROTONIX Take 1 tablet by mouth 2 (two) times daily.   VOLTAREN 1 % Gel Generic drug:  diclofenac sodium Apply 2 g topically daily as needed (pain).       Follow-up Information    Wende Neighbors, MD Follow up in 1 week(s).   Specialty:  Internal Medicine Contact information: [place] [address] [phone]        [facility] Follow up.   Specialty:  Emergency Medicine Why:  If symptoms worsen Contact information: [address] [id number] mc Norfolk, [place]. Schedule an appointment as soon as possible for a visit in 2 week(s).   Specialty:  Gastroenterology Contact information: [address], [place] [address] [id number]           Major procedures and Radiology Reports - PLEASE review detailed and final reports thoroughly  -         Ct Abdomen Pelvis W Contrast  Result Date: 03/02/2016 CLINICAL DATA:  61 year old female with abdominal pain and nausea. EXAM: CT ABDOMEN AND PELVIS WITH CONTRAST TECHNIQUE: Multidetector CT imaging of the abdomen and pelvis was performed using the standard protocol following bolus administration of intravenous contrast. CONTRAST:  160mL ISOVUE-300 IOPAMIDOL (ISOVUE-300) INJECTION 61% COMPARISON:  Abdominal CT dated 08/09/2011 FINDINGS: Lower chest:  The visualized lung bases are clear. No intra-abdominal free air or free fluid. Hepatobiliary: Apparent mild fatty infiltration of the liver. No intrahepatic biliary ductal dilatation. Cholecystectomy. Pancreas: Unremarkable. No  pancreatic ductal dilatation or surrounding inflammatory changes. Spleen: Normal in size without focal abnormality. Adrenals/Urinary Tract: The adrenal glands are unremarkable. Subcentimeter bilateral renal hypodense lesions are too small to characterize but most likely represent cysts. There is no hydronephrosis on either side. The visualized ureters and urinary bladder appear unremarkable. Stomach/Bowel: Small hiatal hernia with gastroesophageal reflux. Loose stool noted within the colon compatible with diarrheal state. Correlation with clinical exam and stool cultures recommended. There are scattered sigmoid diverticula without active inflammatory changes. There is no evidence of bowel obstruction or active inflammation. Appendectomy. Vascular/Lymphatic: Mild aortoiliac atherosclerotic disease. The origins of the celiac axis, SMA, IMA as well as the origins of the renal arteries are patent. The SMV, splenic vein, and main portal vein are patent. No portal venous gas identified. There is no adenopathy. Reproductive: Hysterectomy. Other: None Musculoskeletal: Osteopenia with mild degenerative changes of the spine. No acute fracture. IMPRESSION: Diarrheal state. Correlation with clinical exam and stool cultures recommended. No other acute intra-abdominopelvic pathology identified. Sigmoid diverticulosis. Apparent mild fatty liver. Electronically Signed   By: Anner Crete M.D.   On: 03/02/2016 03:51    Micro Results     Recent Results (from the past 240 hour(s))  Urine culture     Status: Abnormal (Preliminary result)   Collection Time: 03/02/16  2:07 AM  Result Value Ref Range Status   Specimen Description URINE, CLEAN CATCH  Final   Special Requests NONE  Final   Culture >=100,000 COLONIES/mL GRAM NEGATIVE RODS (A)  Final   Report Status PENDING  Incomplete  Blood Culture (routine x 2)     Status: None (Preliminary result)   Collection Time: 03/02/16  7:13 AM  Result Value Ref Range Status    Specimen Description BLOOD RIGHT ARM  Final   Special Requests BOTTLES DRAWN AEROBIC AND ANAEROBIC 8CC  Final   Culture NO GROWTH < 24 HOURS  Final   Report Status PENDING  Incomplete  Blood Culture (routine x 2)     Status: None (Preliminary result)   Collection Time: 03/02/16  7:25 AM  Result Value Ref Range Status   Specimen Description BLOOD RIGHT WRIST  Final   Special Requests BOTTLES DRAWN AEROBIC AND ANAEROBIC 8CC  Final   Culture NO GROWTH < 24 HOURS  Final   Report Status PENDING  Incomplete    Today   Subjective    Stephanie Norris today has no headache,no chest abdominal pain,no new weakness tingling or numbness, feels much better wants to go home today.     Objective   Blood pressure 129/78, pulse 73, temperature 98.1 F (36.7 C), temperature source Oral, resp. rate 20, height 4\' 11"  (1.499 m), weight 61.2 kg (135 lb), SpO2 99 %.   Intake/Output Summary (Last 24 hours) at 03/03/16 1025 Last data filed at 03/03/16 0809  Gross per 24 hour  Intake             3075 ml  Output                0 ml  Net             3075 ml    Exam Awake Alert, Oriented x 3, No new F.N deficits, Normal affect [place].AT,PERRAL Supple Neck,No JVD, No cervical lymphadenopathy appriciated.  Symmetrical Chest wall movement, Good  air movement bilaterally, CTAB RRR,No Gallops,Rubs or new Murmurs, No Parasternal Heave +ve B.Sounds, Abd Soft, Non tender, No organomegaly appriciated, No rebound -guarding or rigidity. No Cyanosis, Clubbing or edema, No new Rash or bruise   Data Review   CBC w Diff:  Lab Results  Component Value Date   WBC 7.7 03/03/2016   HGB 12.3 03/03/2016   HCT 35.0 (L) 03/03/2016   PLT 313 03/03/2016   LYMPHOPCT 19 03/02/2016   MONOPCT 5 03/02/2016   EOSPCT 1 03/02/2016   BASOPCT 0 03/02/2016    CMP:  Lab Results  Component Value Date   NA 139 03/03/2016   K 3.7 03/03/2016   CL 106 03/03/2016   CO2 25 03/03/2016   BUN 8 03/03/2016   CREATININE 0.73 03/03/2016     CREATININE 0.78 01/03/2016   PROT 7.6 03/02/2016   ALBUMIN 4.5 03/02/2016   BILITOT 0.5 03/02/2016   ALKPHOS 63 03/02/2016   AST 22 03/02/2016   ALT 26 03/02/2016  .   Total Time in preparing paper work, data evaluation and todays exam - 35 minutes  Thurnell Lose M.D on 03/03/2016 at 10:25 AM  [facility]   Office  [phone]

## 2016-03-03 NOTE — Progress Notes (Signed)
Patient being discharged. All discharged paper work given. Did not received any prescriptions due to Dr. Candiss Norse felt she did not need any and the prescriptions on AVS were given by ED doctor. Husband will take patient home by car. All IV access was removed.

## 2016-03-03 NOTE — Discharge Instructions (Signed)
Follow with Primary MD Wende Neighbors, MD in 7 days   Get CBC, CMP, 2 view Chest X ray checked  by Primary MD or SNF MD in 5-7 days ( we routinely change or add medications that can affect your baseline labs and fluid status, therefore we recommend that you get the mentioned basic workup next visit with your PCP, your PCP may decide not to get them or add new tests based on their clinical decision)   Activity: As tolerated with Full fall precautions use walker/cane & assistance as needed   Disposition Home     Diet:   DIET SOFT Room    For Heart failure patients - Check your Weight same time everyday, if you gain over 2 pounds, or you develop in leg swelling, experience more shortness of breath or chest pain, call your Primary MD immediately. Follow Cardiac Low Salt Diet and 1.5 lit/day fluid restriction.   On your next visit with your primary care physician please Get Medicines reviewed and adjusted.   Please request your Prim.MD to go over all Hospital Tests and Procedure/Radiological results at the follow up, please get all Hospital records sent to your Prim MD by signing hospital release before you go home.   If you experience worsening of your admission symptoms, develop shortness of breath, life threatening emergency, suicidal or homicidal thoughts you must seek medical attention immediately by calling 911 or calling your MD immediately  if symptoms less severe.  You Must read complete instructions/literature along with all the possible adverse reactions/side effects for all the Medicines you take and that have been prescribed to you. Take any new Medicines after you have completely understood and accpet all the possible adverse reactions/side effects.   Do not drive, operate heavy machinery, perform activities at heights, swimming or participation in water activities or provide baby sitting services if your were admitted for syncope or siezures until you have seen by Primary MD or a  Neurologist and advised to do so again.  Do not drive when taking Pain medications.    Do not take more than prescribed Pain, Sleep and Anxiety Medications  Special Instructions: If you have smoked or chewed Tobacco  in the last 2 yrs please stop smoking, stop any regular Alcohol  and or any Recreational drug use.  Wear Seat belts while driving.   Please note  You were cared for by a hospitalist during your hospital stay. If you have any questions about your discharge medications or the care you received while you were in the hospital after you are discharged, you can call the unit and asked to speak with the hospitalist on call if the hospitalist that took care of you is not available. Once you are discharged, your primary care physician will handle any further medical issues. Please note that NO REFILLS for any discharge medications will be authorized once you are discharged, as it is imperative that you return to your primary care physician (or establish a relationship with a primary care physician if you do not have one) for your aftercare needs so that they can reassess your need for medications and monitor your lab values.

## 2016-03-04 LAB — URINE CULTURE: Culture: >=100000 — AB

## 2016-03-07 LAB — CULTURE, BLOOD (ROUTINE X 2)
Culture: NO GROWTH
Culture: NO GROWTH

## 2016-03-23 ENCOUNTER — Other Ambulatory Visit: Payer: Self-pay | Admitting: *Deleted

## 2016-03-23 MED ORDER — FOLIC ACID 1 MG PO TABS: 2.0000 mg | ORAL_TABLET | Freq: Every day | ORAL | 4 refills | Status: DC

## 2016-03-23 NOTE — Telephone Encounter (Signed)
Refill request received via fax for folic acid  Last Visit: 01/17/16 Next Visit:06/19/16 Labs: 03/02/16 WBC 17.4 and elevated glucose   Okay to refill Folic Acid?

## 2016-03-23 NOTE — Telephone Encounter (Signed)
Repeat WBC 1 week later was 7.7

## 2016-03-23 NOTE — Telephone Encounter (Signed)
Why is wbc elevated to 17.4?  We need to repeat it  this week and see if wbc has improved.  I will refill folic acid.

## 2016-04-03 ENCOUNTER — Other Ambulatory Visit: Payer: Self-pay | Admitting: Rheumatology

## 2016-04-03 DIAGNOSIS — E119 Type 2 diabetes mellitus without complications: Secondary | ICD-10-CM | POA: Diagnosis not present

## 2016-04-03 DIAGNOSIS — G609 Hereditary and idiopathic neuropathy, unspecified: Secondary | ICD-10-CM | POA: Diagnosis not present

## 2016-04-03 DIAGNOSIS — E7800 Pure hypercholesterolemia, unspecified: Secondary | ICD-10-CM | POA: Diagnosis not present

## 2016-04-03 NOTE — Telephone Encounter (Signed)
Last Visit: 01/17/16 Next Visit:06/19/16 Labs: 03/02/16 WBC 17.4 and elevated glucose Patient was in the hospital when labs were drawn and repeat WBC was 7.7.   Okay to refill MTX?

## 2016-04-03 NOTE — Telephone Encounter (Signed)
ok 

## 2016-04-17 ENCOUNTER — Ambulatory Visit (INDEPENDENT_AMBULATORY_CARE_PROVIDER_SITE_OTHER): Payer: PPO | Admitting: Allergy and Immunology

## 2016-04-17 ENCOUNTER — Encounter: Payer: Self-pay | Admitting: Allergy and Immunology

## 2016-04-17 VITALS — BP 120/70 | HR 93 | Temp 98.1°F | Resp 20 | Ht 58.5 in | Wt 129.0 lb

## 2016-04-17 DIAGNOSIS — J4540 Moderate persistent asthma, uncomplicated: Secondary | ICD-10-CM

## 2016-04-17 DIAGNOSIS — H6983 Other specified disorders of Eustachian tube, bilateral: Secondary | ICD-10-CM

## 2016-04-17 DIAGNOSIS — J309 Allergic rhinitis, unspecified: Secondary | ICD-10-CM | POA: Diagnosis not present

## 2016-04-17 MED ORDER — BUDESONIDE 0.5 MG/2ML IN SUSP: RESPIRATORY_TRACT | 5 refills | Status: DC

## 2016-04-17 NOTE — Progress Notes (Addendum)
Follow-up Note  RE: Stephanie Norris MRN: [id number] DOB: [birth year] Date of Office Visit: 04/17/2016  Primary care provider: Wende Neighbors, MD Referring provider: Celene Squibb, MD  History of present illness: Stephanie Norris is a 61 y.o. female with persistent asthma and mixed rhinitis presenting today for follow up.  She was last seen in this clinic in November 2017.  During her last visit we had attempted to decrease her albuterol controller medication from [facility] to [place], however she reports that due to increased asthma symptoms, she switch back to [place].  While on [place] her asthma has been well controlled.  She rarely requires albuterol rescue and denies nocturnal symptoms.  She experiences some sinus pressure and right ear pressure despite compliance with ipratropium nasal spray and fluticasone nasal spray daily.  In addition, she reports that she takes pseudoephedrine daily and yet still experiences sinus pressure.  She feels pain on the crown of her head and reports that she has had CT scan for sphenoid sinus inflammation in the past.  She states that when she had been living in an apartment her symptoms had been under better control, however after having moved back to her house which has multiple image she has experienced more frequent, troublesome nasal/sinus symptoms.  She reports that she has "very raw and annoying" sores at the edge of her nostrils.   Assessment and plan: Moderate persistent asthma Well-controlled on current treatment plan.  For now, continue Breo Ellipta 100-0.5 g, one inhalation daily, montelukast 10 mg daily at bedtime, and albuterol every 4-6 hours as needed.  Subjective and objective measures of pulmonary function will be followed and the treatment plan will be adjusted accordingly.  Perennial allergic rhinosinusitis Suboptimal control.  Continue appropriate allergen avoidance measures and montelukast 10 mg daily.  Start budesonide/saline nasal  irrigation twice a day.  A prescription has been provided for budesonide 0.5 mg respules and instructions for mixing and adminstering the rinse have been discussed and provided in written form.  The risks and benefits of aeroallergen immunotherapy have been discussed. The patient is motivated to initiate immunotherapy if insurance coverage is favorable. She will let us know how she would like to proceed.  Nasal saline gel applied as needed to intra-nasal sores.  Samples have been provided.  Eustachian tube dysfunction  Treatment plan as outlined above for allergic rhinitis.  If symptoms persist or progress, otolaryngology evaluation may be warranted.   Meds ordered this encounter  Medications  . budesonide (PULMICORT) 0.5 MG/2ML nebulizer solution    Sig: Mix 1 vial in NeilMed 2 times daily as directed.    Dispense:  60 mL    Refill:  5    Diagnostics: Spirometry:  FEV1 of 98% predicted.  Please see scanned spirometry results for details.    Physical examination: Blood pressure 120/70, pulse 93, temperature 98.1 F (36.7 C), temperature source Oral, resp. rate 20, height 4' 10.5" (1.486 m), weight 129 lb (58.5 kg), SpO2 98 %.  General: Alert, interactive, in no acute distress. HEENT: TMs pearly gray, turbinates moderately edematous without discharge, post-pharynx moderately erythematous. Neck: Supple without lymphadenopathy. Lungs: Clear to auscultation without wheezing, rhonchi or rales. CV: Normal S1, S2 without murmurs. Skin: Warm and dry, without lesions or rashes.  The following portions of the patient's history were reviewed and updated as appropriate: allergies, current medications, past family history, past medical history, past social history, past surgical history and problem list.  Allergies as of 04/17/2016  Reactions   Nsaids Anaphylaxis   Bactrim [sulfamethoxazole-trimethoprim]    Upset stomach   Cephalexin    rash   Levofloxacin    rash    Nitrofurantoin Monohyd Macro    rash      Medication List       Accurate as of 04/17/16  6:37 PM. Always use your most recent med list.          albuterol 108 (90 Base) MCG/ACT inhaler Commonly known as:  PROVENTIL HFA;VENTOLIN HFA Inhale 2 puffs into the lungs every 4 (four) hours as needed for wheezing or shortness of breath.   Alpha-Lipoic Acid 200 MG Caps Take 1 capsule by mouth at bedtime.   ALPRAZolam 0.25 MG tablet Commonly known as:  XANAX Take 0.25 mg by mouth at bedtime.   budesonide 0.5 MG/2ML nebulizer solution Commonly known as:  PULMICORT Mix 1 vial in NeilMed 2 times daily as directed.   dicyclomine 20 MG tablet Commonly known as:  BENTYL Take 1 tablet (20 mg total) by mouth 2 (two) times daily.   DULoxetine 60 MG capsule Commonly known as:  CYMBALTA TAKE 1 CAPSULE BY MOUTH EVERY DAY   ESTROVEN PO Take 1 tablet by mouth daily.   Fish Oil 1000 MG Caps Take 1 capsule by mouth daily.   fluticasone 50 MCG/ACT nasal spray Commonly known as:  FLONASE Place 1 spray into both nostrils daily.   fluticasone furoate-vilanterol 100-25 MCG/INH Aepb Commonly known as:  BREO ELLIPTA Inhale 1 puff into the lungs daily.   folic acid 1 MG tablet Commonly known as:  FOLVITE Take 2 tablets (2 mg total) by mouth daily.   ipratropium 0.06 % nasal spray Commonly known as:  ATROVENT Place 2 sprays into both nostrils 3 (three) times daily.   levocetirizine 5 MG tablet Commonly known as:  XYZAL Take 5 mg by mouth every evening.   metFORMIN 500 MG 24 hr tablet Commonly known as:  GLUCOPHAGE-XR Take 1,000 mg by mouth 2 (two) times daily.   methotrexate 2.5 MG tablet Commonly known as:  RHEUMATREX TAKE 6 TABLETS BY MOUTH ONCE PER WEEK   montelukast 10 MG tablet Commonly known as:  SINGULAIR Take 10 mg by mouth at bedtime.   MULTIVITAMIN PO Take by mouth.   ondansetron 4 MG disintegrating tablet Commonly known as:  ZOFRAN ODT Take 1 tablet (4 mg total) by  mouth every 8 (eight) hours as needed for nausea or vomiting.   ONETOUCH VERIO test strip Generic drug:  glucose blood   pantoprazole 40 MG tablet Commonly known as:  PROTONIX Take 1 tablet by mouth 2 (two) times daily.   VOLTAREN 1 % Gel Generic drug:  diclofenac sodium Apply 2 g topically daily as needed (pain).       Allergies  Allergen Reactions  . Nsaids Anaphylaxis  . Bactrim [Sulfamethoxazole-Trimethoprim]     Upset stomach  . Cephalexin     rash  . Levofloxacin     rash  . Nitrofurantoin Monohyd Macro     rash   Review of systems: Review of systems negative except as noted in HPI / PMHx or noted below: Constitutional: Negative.  HENT: Negative.   Eyes: Negative.  Respiratory: Negative.   Cardiovascular: Negative.  Gastrointestinal: Negative.  Genitourinary: Negative.  Musculoskeletal: Negative.  Neurological: Negative.  Endo/Heme/Allergies: Negative.  Cutaneous: Negative.  Past Medical History:  Diagnosis Date  . Asthma   . Diabetes mellitus without complication ([place])   . Fibromyalgia   . Hyperlipidemia   .  OSA (obstructive sleep apnea)   . Osteoarthritis   . PCOS (polycystic ovarian syndrome)   . Rheumatoid arthritis ([place])     Family History  Problem Relation Age of Onset  . Asthma Mother   . Rheum arthritis Mother   . Allergic rhinitis Mother   . Rheum arthritis Sister   . Allergic rhinitis Sister   . Heart failure Father   . Heart disease Father   . Rheum arthritis Sister   . Allergic rhinitis Sister   . Allergic rhinitis Brother   . Allergies      "Everyone"  . Heart disease Maternal Grandfather   . Heart disease Maternal Grandmother   . Heart disease Paternal Grandfather   . Heart disease Paternal Grandmother     Social History   Social History  . Marital status: Married    Spouse name: N/A  . Number of children: 0  . Years of education: N/A   Occupational History  . Patient Account Rep [employer]  History Main Topics  . Smoking status: Never Smoker  . Smokeless tobacco: Never Used  . Alcohol use No  . Drug use: No  . Sexual activity: Not on file   Other Topics Concern  . Not on file   Social History Narrative  . No narrative on file    I appreciate the opportunity to take part in Stephanie Norris's care. Please do not hesitate to contact me with questions.  Sincerely,   R. Edgar Frisk, MD

## 2016-04-17 NOTE — Assessment & Plan Note (Addendum)
Suboptimal control.  Continue appropriate allergen avoidance measures and montelukast 10 mg daily.  Start budesonide/saline nasal irrigation twice a day.  A prescription has been provided for budesonide 0.5 mg respules and instructions for mixing and adminstering the rinse have been discussed and provided in written form.  The risks and benefits of aeroallergen immunotherapy have been discussed. The patient is motivated to initiate immunotherapy if insurance coverage is favorable. She will let us know how she would like to proceed.  Nasal saline gel applied as needed to intra-nasal sores.  Samples have been provided.

## 2016-04-17 NOTE — Assessment & Plan Note (Signed)
   Treatment plan as outlined above for allergic rhinitis.  If symptoms persist or progress, otolaryngology evaluation may be warranted.

## 2016-04-17 NOTE — Patient Instructions (Addendum)
Moderate persistent asthma Well-controlled on current treatment plan.  For now, continue Breo Ellipta 100-0.5 g, one inhalation daily, montelukast 10 mg daily at bedtime, and albuterol every 4-6 hours as needed.  Subjective and objective measures of pulmonary function will be followed and the treatment plan will be adjusted accordingly.  Perennial allergic rhinosinusitis Suboptimal control.  Continue appropriate allergen avoidance measures and montelukast 10 mg daily.  Start budesonide/saline nasal irrigation twice a day.  A prescription has been provided for budesonide 0.5 mg respules and instructions for mixing and adminstering the rinse have been discussed and provided in written form.  The risks and benefits of aeroallergen immunotherapy have been discussed. The patient is motivated to initiate immunotherapy if insurance coverage is favorable. She will let us know how she would like to proceed.  Nasal saline gel applied as needed to intra-nasal sores.  Samples have been provided.  Eustachian tube dysfunction  Treatment plan as outlined above for allergic rhinitis.  If symptoms persist or progress, otolaryngology evaluation may be warranted.  Return in about 4 months (around 08/17/2016), or if symptoms worsen or fail to improve.   Budesonide (Pulmicort) + Saline Irrigation/Rinse  Budesonide (Pulmicort) is an anti-inflammatory steroid medication used to decrease nasal and sinus inflammation. It is dispensed in liquid form in a vial. Although it is manufactured for use with a nebulizer, we intend for you to use it with the NeilMed Sinus Rinse bottle (preferred) or a Neti pot.   Instructions:  1) Make 240cc of saline in the NeilMed bottle using the salt packets or your own saline recipe (see separate handout).  2) Add the entire 2cc vial of liquid Budesonide (Pulmicort) to the rinse bottle and mix together.  3) While in the shower or over the sink, tilt your head forward to a  comfortable level. Put the tip of the sinus rinse bottle in your nostril and aim it towards the crown or top of your head. Gently squeeze the bottle to flush out your nose. The fluid will circulate in and out of your sinus cavities, coming back out from either nostril or through your mouth. Try not to swallow large quantities and spit it out instead.  4) Perform Budesonide (Pulmicort) + Saline irrigations 2 times daily.

## 2016-04-17 NOTE — Assessment & Plan Note (Signed)
Well-controlled on current treatment plan.  For now, continue Breo Ellipta 100-0.5 g, one inhalation daily, montelukast 10 mg daily at bedtime, and albuterol every 4-6 hours as needed.  Subjective and objective measures of pulmonary function will be followed and the treatment plan will be adjusted accordingly.

## 2016-04-18 ENCOUNTER — Other Ambulatory Visit: Payer: Self-pay | Admitting: *Deleted

## 2016-04-18 DIAGNOSIS — J4540 Moderate persistent asthma, uncomplicated: Secondary | ICD-10-CM

## 2016-04-18 DIAGNOSIS — J309 Allergic rhinitis, unspecified: Secondary | ICD-10-CM

## 2016-04-18 MED ORDER — BUDESONIDE 0.5 MG/2ML IN SUSP: RESPIRATORY_TRACT | 5 refills | Status: DC

## 2016-05-08 DIAGNOSIS — E119 Type 2 diabetes mellitus without complications: Secondary | ICD-10-CM | POA: Diagnosis not present

## 2016-05-08 DIAGNOSIS — H26492 Other secondary cataract, left eye: Secondary | ICD-10-CM | POA: Diagnosis not present

## 2016-05-08 DIAGNOSIS — H5310 Unspecified subjective visual disturbances: Secondary | ICD-10-CM | POA: Diagnosis not present

## 2016-05-08 DIAGNOSIS — Z961 Presence of intraocular lens: Secondary | ICD-10-CM | POA: Diagnosis not present

## 2016-05-08 DIAGNOSIS — Z9841 Cataract extraction status, right eye: Secondary | ICD-10-CM | POA: Diagnosis not present

## 2016-05-08 DIAGNOSIS — Z7984 Long term (current) use of oral hypoglycemic drugs: Secondary | ICD-10-CM | POA: Diagnosis not present

## 2016-05-08 DIAGNOSIS — H11823 Conjunctivochalasis, bilateral: Secondary | ICD-10-CM | POA: Diagnosis not present

## 2016-05-09 DIAGNOSIS — J309 Allergic rhinitis, unspecified: Secondary | ICD-10-CM | POA: Diagnosis not present

## 2016-05-09 DIAGNOSIS — R0981 Nasal congestion: Secondary | ICD-10-CM | POA: Diagnosis not present

## 2016-06-14 DIAGNOSIS — L237 Allergic contact dermatitis due to plants, except food: Secondary | ICD-10-CM | POA: Diagnosis not present

## 2016-06-14 DIAGNOSIS — E1165 Type 2 diabetes mellitus with hyperglycemia: Secondary | ICD-10-CM | POA: Diagnosis not present

## 2016-06-14 DIAGNOSIS — Z6826 Body mass index (BMI) 26.0-26.9, adult: Secondary | ICD-10-CM | POA: Diagnosis not present

## 2016-06-18 DIAGNOSIS — L237 Allergic contact dermatitis due to plants, except food: Secondary | ICD-10-CM | POA: Diagnosis not present

## 2016-06-19 ENCOUNTER — Ambulatory Visit: Payer: PPO | Admitting: Rheumatology

## 2016-07-04 ENCOUNTER — Other Ambulatory Visit: Payer: Self-pay | Admitting: Rheumatology

## 2016-07-05 NOTE — Telephone Encounter (Signed)
Last Visit: 01/17/16 Next Visit due May 2018 Message sent to front to schedule patient.   Okay to refill Cymbalta?

## 2016-07-09 ENCOUNTER — Telehealth: Payer: Self-pay | Admitting: *Deleted

## 2016-07-09 DIAGNOSIS — J060 Acute laryngopharyngitis: Secondary | ICD-10-CM | POA: Diagnosis not present

## 2016-07-09 DIAGNOSIS — Z6826 Body mass index (BMI) 26.0-26.9, adult: Secondary | ICD-10-CM | POA: Diagnosis not present

## 2016-07-09 NOTE — Telephone Encounter (Signed)
-----   Message from Altamese Dilling sent at 07/05/2016 10:01 AM EDT ----- Regarding: RE: Please schedule patient for a follow up visit Left a voice mail message for patient to call our office back to schedule her follow up appointment.  ----- Message ----- From: Carole Binning, LPN Sent: 1/84/0375   8:32 AM To: Milda Smart Admin Subject: Please schedule patient for a follow up visit  Please schedule patient for a follow up visit. Patient was due May 2018. Thanks!

## 2016-07-17 ENCOUNTER — Other Ambulatory Visit: Payer: Self-pay | Admitting: Pulmonary Disease

## 2016-07-17 DIAGNOSIS — J4540 Moderate persistent asthma, uncomplicated: Secondary | ICD-10-CM

## 2016-08-20 ENCOUNTER — Other Ambulatory Visit: Payer: Self-pay | Admitting: *Deleted

## 2016-08-20 ENCOUNTER — Ambulatory Visit (INDEPENDENT_AMBULATORY_CARE_PROVIDER_SITE_OTHER): Payer: PPO | Admitting: Allergy and Immunology

## 2016-08-20 ENCOUNTER — Encounter: Payer: Self-pay | Admitting: Allergy and Immunology

## 2016-08-20 VITALS — BP 118/70 | HR 72 | Resp 16

## 2016-08-20 DIAGNOSIS — J4540 Moderate persistent asthma, uncomplicated: Secondary | ICD-10-CM

## 2016-08-20 DIAGNOSIS — J3089 Other allergic rhinitis: Secondary | ICD-10-CM | POA: Diagnosis not present

## 2016-08-20 DIAGNOSIS — R42 Dizziness and giddiness: Secondary | ICD-10-CM

## 2016-08-20 DIAGNOSIS — H6983 Other specified disorders of Eustachian tube, bilateral: Secondary | ICD-10-CM

## 2016-08-20 MED ORDER — FLUTICASONE FUROATE-VILANTEROL 100-25 MCG/INH IN AEPB: 1.0000 | INHALATION_SPRAY | Freq: Every day | RESPIRATORY_TRACT | 3 refills | Status: DC

## 2016-08-20 MED ORDER — MONTELUKAST SODIUM 10 MG PO TABS: 10.0000 mg | ORAL_TABLET | Freq: Every day | ORAL | 2 refills | Status: DC

## 2016-08-20 NOTE — Assessment & Plan Note (Signed)
   Continue appropriate allergen avoidance measures, ipratropium nasal spray as needed, and fluticasone nasal spray as needed.  I have also recommended nasal saline spray (i.e., Simply Saline) or nasal saline lavage (i.e., NeilMed) as needed and prior to medicated nasal sprays.  For thick post nasal drainage, add guaifenesin [phone] mg (Mucinex)  twice daily as needed with adequate hydration as discussed.  If allergen avoidance measures and medications fail to adequately relieve symptoms, aeroallergen immunotherapy will be considered.

## 2016-08-20 NOTE — Assessment & Plan Note (Signed)
Well-controlled.  Continue Breo Ellipta 100-0.5 g, one inhalation daily, montelukast 10 mg daily at bedtime, and albuterol every 4-6 hours as needed.  Subjective and objective measures of pulmonary function will be followed and the treatment plan will be adjusted accordingly.

## 2016-08-20 NOTE — Assessment & Plan Note (Addendum)
The history suggests benign paroxysmal positional vertigo.  A referral has been made to Dr. Benjamine Mola, otolaryngology, for further evaluation/treatment.

## 2016-08-20 NOTE — Patient Instructions (Signed)
Moderate persistent asthma Well-controlled.  Continue Breo Ellipta 100-0.5 g, one inhalation daily, montelukast 10 mg daily at bedtime, and albuterol every 4-6 hours as needed.  Subjective and objective measures of pulmonary function will be followed and the treatment plan will be adjusted accordingly.  Perennial allergic rhinosinusitis  Continue appropriate allergen avoidance measures, ipratropium nasal spray as needed, and fluticasone nasal spray as needed.  I have also recommended nasal saline spray (i.e., Simply Saline) or nasal saline lavage (i.e., NeilMed) as needed and prior to medicated nasal sprays.  For thick post nasal drainage, add guaifenesin [phone] mg (Mucinex)  twice daily as needed with adequate hydration as discussed.  If allergen avoidance measures and medications fail to adequately relieve symptoms, aeroallergen immunotherapy will be considered.  Vertigo The history suggests benign paroxysmal positional vertigo.  A referral has been made to Dr. Benjamine Mola, otolaryngology, for further evaluation/treatment.   Return in about 5 months (around 01/20/2017), or if symptoms worsen or fail to improve.

## 2016-08-20 NOTE — Progress Notes (Signed)
Follow-up Note  RE: Stephanie Norris MRN: [id number] DOB: 12/07/1955 Date of Office Visit: 08/20/2016  Primary care provider: Celene Squibb, MD Referring provider: Celene Squibb, MD  History of present illness: Stephanie Norris is a 61 y.o. female with persistent asthma, mixed rhinitis, and eustachian tube dysfunction presenting today for follow up.  She was last seen in this clinic on 04/17/2016.  She reports that her insurance will not pay for budesonide for steroid saline rinse.  She has been instead using fluticasone nasal spray.  She is no nasal symptom complaints today, however apparently experiences mild ear pressure on occasion.  She has been experiencing vertigo when tilting her head to 45 or looking upward.  She reports that she has been evaluated by 4 otolaryngologists in the past without a diagnosis been established.  Her asthma has been well-controlled in the interval since her previous visit.  She rarely requires albuterol rescue and denies nocturnal awakenings due to lower respiratory symptoms.   Assessment and plan: Moderate persistent asthma Well-controlled.  Continue Breo Ellipta 100-0.5 g, one inhalation daily, montelukast 10 mg daily at bedtime, and albuterol every 4-6 hours as needed.  Subjective and objective measures of pulmonary function will be followed and the treatment plan will be adjusted accordingly.  Perennial allergic rhinosinusitis  Continue appropriate allergen avoidance measures, ipratropium nasal spray as needed, and fluticasone nasal spray as needed.  I have also recommended nasal saline spray (i.e., Simply Saline) or nasal saline lavage (i.e., NeilMed) as needed and prior to medicated nasal sprays.  For thick post nasal drainage, add guaifenesin [phone] mg (Mucinex)  twice daily as needed with adequate hydration as discussed.  If allergen avoidance measures and medications fail to adequately relieve symptoms, aeroallergen immunotherapy will be  considered.  Vertigo The history suggests benign paroxysmal positional vertigo.  A referral has been made to Dr. Benjamine Mola, otolaryngology, for further evaluation/treatment.   Meds ordered this encounter  Medications  . fluticasone furoate-vilanterol (BREO ELLIPTA) 100-25 MCG/INH AEPB    Sig: Inhale 1 puff into the lungs daily.    Dispense:  1 each    Refill:  3  . montelukast (SINGULAIR) 10 MG tablet    Sig: Take 1 tablet (10 mg total) by mouth at bedtime.    Dispense:  30 tablet    Refill:  2    Diagnostics: Spirometry:  Normal with an FEV1 of 88% predicted.  Please see scanned spirometry results for details.    Physical examination: Blood pressure 118/70, pulse 72, resp. rate 16.  General: Alert, interactive, in no acute distress. HEENT: TMs pearly gray, turbinates mildly edematous without discharge, post-pharynx mildly erythematous. Neck: Supple without lymphadenopathy. Lungs: Clear to auscultation without wheezing, rhonchi or rales. CV: Normal S1, S2 without murmurs. Skin: Warm and dry, without lesions or rashes.  The following portions of the patient's history were reviewed and updated as appropriate: allergies, current medications, past family history, past medical history, past social history, past surgical history and problem list.  Allergies as of 08/20/2016      Reactions   Nsaids Anaphylaxis   Bactrim [sulfamethoxazole-trimethoprim]    Upset stomach   Cephalexin    rash   Levofloxacin    rash   Nitrofurantoin Monohyd Macro    rash      Medication List       Accurate as of 08/20/16  8:09 PM. Always use your most recent med list.          albuterol 108 (  90 Base) MCG/ACT inhaler Commonly known as:  PROVENTIL HFA;VENTOLIN HFA Inhale 2 puffs into the lungs every 4 (four) hours as needed for wheezing or shortness of breath.   Alpha-Lipoic Acid 200 MG Caps Take 1 capsule by mouth at bedtime.   ALPRAZolam 0.25 MG tablet Commonly known as:  XANAX Take  0.25 mg by mouth at bedtime.   dicyclomine 20 MG tablet Commonly known as:  BENTYL Take 1 tablet (20 mg total) by mouth 2 (two) times daily.   DULoxetine 60 MG capsule Commonly known as:  CYMBALTA TAKE 1 CAPSULE BY MOUTH EVERY DAY   ESTROVEN PO Take 1 tablet by mouth daily.   Fish Oil 1000 MG Caps Take 1 capsule by mouth daily.   fluticasone 50 MCG/ACT nasal spray Commonly known as:  FLONASE Place 1 spray into both nostrils daily.   fluticasone furoate-vilanterol 100-25 MCG/INH Aepb Commonly known as:  BREO ELLIPTA Inhale 1 puff into the lungs daily.   folic acid 1 MG tablet Commonly known as:  FOLVITE Take 2 tablets (2 mg total) by mouth daily.   ipratropium 0.06 % nasal spray Commonly known as:  ATROVENT Place 2 sprays into both nostrils 3 (three) times daily.   levocetirizine 5 MG tablet Commonly known as:  XYZAL Take 5 mg by mouth every evening.   metFORMIN 500 MG 24 hr tablet Commonly known as:  GLUCOPHAGE-XR Take 1,000 mg by mouth 2 (two) times daily.   methotrexate 2.5 MG tablet Commonly known as:  RHEUMATREX TAKE 6 TABLETS BY MOUTH ONCE PER WEEK   montelukast 10 MG tablet Commonly known as:  SINGULAIR Take 1 tablet (10 mg total) by mouth at bedtime.   MULTIVITAMIN PO Take by mouth.   ONETOUCH VERIO test strip Generic drug:  glucose blood   pantoprazole 40 MG tablet Commonly known as:  PROTONIX Take 1 tablet by mouth 2 (two) times daily.   triamcinolone cream 0.1 % Commonly known as:  KENALOG APPLY ON THE SKIN BID   VOLTAREN 1 % Gel Generic drug:  diclofenac sodium Apply 2 g topically daily as needed (pain).       Allergies  Allergen Reactions  . Nsaids Anaphylaxis  . Bactrim [Sulfamethoxazole-Trimethoprim]     Upset stomach  . Cephalexin     rash  . Levofloxacin     rash  . Nitrofurantoin Monohyd Macro     rash   Review of systems: Review of systems negative except as noted in HPI / PMHx or noted below: Constitutional:  Negative.  HENT: Negative.   Eyes: Negative.  Respiratory: Negative.   Cardiovascular: Negative.  Gastrointestinal: Negative.  Genitourinary: Negative.  Musculoskeletal: Negative.  Neurological: Negative.  Endo/Heme/Allergies: Negative.  Cutaneous: Negative.   Past Medical History:  Diagnosis Date  . Asthma   . Diabetes mellitus without complication ([place])   . Fibromyalgia   . Hyperlipidemia   . OSA (obstructive sleep apnea)   . Osteoarthritis   . PCOS (polycystic ovarian syndrome)   . Rheumatoid arthritis ([place])     Family History  Problem Relation Age of Onset  . Asthma Mother   . Rheum arthritis Mother   . Allergic rhinitis Mother   . Rheum arthritis Sister   . Allergic rhinitis Sister   . Heart failure Father   . Heart disease Father   . Rheum arthritis Sister   . Allergic rhinitis Sister   . Allergic rhinitis Brother   . Allergies Unknown        "Everyone"  .  Heart disease Maternal Grandfather   . Heart disease Maternal Grandmother   . Heart disease Paternal Grandfather   . Heart disease Paternal Grandmother     Social History   Social History  . Marital status: Married    Spouse name: N/A  . Number of children: 0  . 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 Smoking status: Never Smoker  . Smokeless tobacco: Never Used  . Alcohol use No  . Drug use: No  . Sexual activity: Not on file   Other Topics Concern  . Not on file   Social History Narrative  . No narrative on file    I appreciate the opportunity to take part in Minie's care. Please do not hesitate to contact me with questions.  Sincerely,   R. Edgar Frisk, MD

## 2016-08-22 ENCOUNTER — Telehealth: Payer: Self-pay

## 2016-08-22 NOTE — Telephone Encounter (Signed)
Patients referral has been faxed to Dr. Deeann Saint office.   Thanks

## 2016-08-22 NOTE — Telephone Encounter (Signed)
-----   Message from Adelina Mings, MD sent at 08/20/2016  8:06 PM EDT ----- Please make a referral to Dr. Benjamine Mola, otolaryngology, for vertigo and eustachian tube dysfunction. Thanks.

## 2016-09-09 ENCOUNTER — Other Ambulatory Visit: Payer: Self-pay | Admitting: Rheumatology

## 2016-09-09 DIAGNOSIS — Z79899 Other long term (current) drug therapy: Secondary | ICD-10-CM

## 2016-09-10 DIAGNOSIS — Z79899 Other long term (current) drug therapy: Secondary | ICD-10-CM | POA: Diagnosis not present

## 2016-09-10 NOTE — Telephone Encounter (Addendum)
Last Visit:01/17/16 Next Visit:Labs:09/25/16 Labs: 03/02/16 CMP elevated glucose CBC-/ WBC 17.4 Patient will update labs tomorrow  Okay to refill 30 day supply MTX?

## 2016-09-10 NOTE — Telephone Encounter (Signed)
Refill only after lab draw

## 2016-09-11 ENCOUNTER — Other Ambulatory Visit: Payer: Self-pay | Admitting: Rheumatology

## 2016-09-12 DIAGNOSIS — E7800 Pure hypercholesterolemia, unspecified: Secondary | ICD-10-CM | POA: Diagnosis not present

## 2016-09-12 DIAGNOSIS — E119 Type 2 diabetes mellitus without complications: Secondary | ICD-10-CM | POA: Diagnosis not present

## 2016-09-12 DIAGNOSIS — G609 Hereditary and idiopathic neuropathy, unspecified: Secondary | ICD-10-CM | POA: Diagnosis not present

## 2016-09-12 LAB — CBC WITH DIFFERENTIAL/PLATELET
BASOS ABS: 102 {cells}/uL (ref 0–200)
Basophils Relative: 1 %
EOS ABS: 204 {cells}/uL (ref 15–500)
EOS PCT: 2 %
HCT: 37.8 % (ref 35.0–45.0)
Hemoglobin: 12.7 g/dL (ref 11.7–15.5)
LYMPHS ABS: 3162 {cells}/uL (ref 850–3900)
Lymphocytes Relative: 31 %
MCH: 31.3 pg (ref 27.0–33.0)
MCHC: 33.6 g/dL (ref 32.0–36.0)
MCV: 93.1 fL (ref 80.0–100.0)
MONOS PCT: 7 %
MPV: 10.4 fL (ref 7.5–12.5)
Monocytes Absolute: 714 cells/uL (ref 200–950)
NEUTROS ABS: 6018 {cells}/uL (ref 1500–7800)
NEUTROS PCT: 59 %
Platelets: 388 10*3/uL (ref 140–400)
RBC: 4.06 MIL/uL (ref 3.80–5.10)
RDW: 14.1 % (ref 11.0–15.0)
WBC: 10.2 10*3/uL (ref 3.8–10.8)

## 2016-09-13 LAB — COMPREHENSIVE METABOLIC PANEL
ALBUMIN: 4 g/dL (ref 3.6–5.1)
ALK PHOS: 54 U/L (ref 33–130)
ALT: 23 U/L (ref 6–29)
AST: 14 U/L (ref 10–35)
BUN: 16 mg/dL (ref 7–25)
CHLORIDE: 105 mmol/L (ref 98–110)
CO2: 19 mmol/L — AB (ref 20–32)
CREATININE: 0.73 mg/dL (ref 0.50–0.99)
Calcium: 10.3 mg/dL (ref 8.6–10.4)
Glucose, Bld: 147 mg/dL — ABNORMAL HIGH (ref 65–99)
POTASSIUM: 4.4 mmol/L (ref 3.5–5.3)
SODIUM: 139 mmol/L (ref 135–146)
TOTAL PROTEIN: 6.3 g/dL (ref 6.1–8.1)
Total Bilirubin: 0.3 mg/dL (ref 0.2–1.2)

## 2016-09-20 ENCOUNTER — Ambulatory Visit (INDEPENDENT_AMBULATORY_CARE_PROVIDER_SITE_OTHER): Payer: PPO | Admitting: Otolaryngology

## 2016-09-20 DIAGNOSIS — H9313 Tinnitus, bilateral: Secondary | ICD-10-CM | POA: Diagnosis not present

## 2016-09-20 DIAGNOSIS — R42 Dizziness and giddiness: Secondary | ICD-10-CM

## 2016-09-20 DIAGNOSIS — H903 Sensorineural hearing loss, bilateral: Secondary | ICD-10-CM | POA: Diagnosis not present

## 2016-09-20 NOTE — Progress Notes (Signed)
Office Visit Note  Patient: Stephanie Norris             Date of Birth: January 25, 1956           MRN: [id number]             PCP: Celene Squibb, MD Referring: Celene Squibb, MD Visit Date: 09/25/2016 Occupation: '@GUAROCC'$ @    Subjective:  Joint stiffness.   History of Present Illness: Stephanie Norris is a 61 y.o. female with history of sero positive rheumatoid arthritis. She states she's been having some stiffness in her joints. She noticed some joint thickening but no joint swelling. She continues to have discomfort in her C-spine. The lower back is doing okay. She's not having much discomfort from fibromyalgia syndrome.  Activities of Daily Living:  Patient reports morning stiffness for 15  minutes.   Patient Reports nocturnal pain.  Difficulty dressing/grooming: Reports Difficulty climbing stairs: Denies Difficulty getting out of chair: Denies Difficulty using hands for taps, buttons, cutlery, and/or writing: Reports   Review of Systems  Constitutional: Negative.  Negative for fatigue, night sweats, weight gain, weight loss and weakness.  HENT: Positive for mouth dryness. Negative for mouth sores, trouble swallowing, trouble swallowing and nose dryness.   Eyes: Negative for pain, redness, visual disturbance and dryness.  Respiratory: Negative.  Negative for cough, shortness of breath and difficulty breathing.   Cardiovascular: Negative.  Negative for chest pain, palpitations, hypertension, irregular heartbeat and swelling in legs/feet.  Gastrointestinal: Positive for constipation. Negative for blood in stool and diarrhea.  Endocrine: Negative.  Negative for increased urination.  Genitourinary: Negative for vaginal dryness.  Musculoskeletal: Positive for arthralgias, joint pain, myalgias, morning stiffness and myalgias. Negative for joint swelling, muscle weakness and muscle tenderness.  Skin: Negative.  Negative for color change, rash, hair loss, skin tightness, ulcers and sensitivity  to sunlight.  Allergic/Immunologic: Negative for susceptible to infections.  Neurological: Negative for dizziness, light-headedness, headaches, memory loss and night sweats.  Hematological: Negative.  Negative for swollen glands.  Psychiatric/Behavioral: Negative.  Negative for depressed mood and sleep disturbance. The patient is not nervous/anxious.     PMFS History:  Patient Active Problem List   Diagnosis Date Noted  . Vertigo 08/20/2016  . Eustachian tube dysfunction 04/17/2016  . Abdominal pain 03/02/2016  . Gastroenteritis 03/02/2016  . Rheumatoid arthritis with rheumatoid factor of multiple sites without organ or systems involvement ([place]) 01/13/2016  . HLA B27 (HLA B27 positive) 01/13/2016  . Osteoarthritis of foot 01/13/2016  . DJD (degenerative joint disease), cervical 01/13/2016  . Spondylosis of lumbar region without myelopathy or radiculopathy 01/13/2016  . Primary osteoarthritis of both hands 01/13/2016  . Perennial allergic rhinosinusitis 08/13/2014  . Stiffness of joints, not elsewhere classified, multiple sites 08/26/2013  . Flu-like symptoms 03/02/2013  . Lactic acidosis 03/02/2013  . Sinus tachycardia 03/02/2013  . Elevated lactic acid level 03/02/2013  . Influenza due to identified novel influenza A virus with other respiratory manifestations 03/02/2013  . Hyperglycemia 03/02/2013  . Fibromyalgia   . OSA (obstructive sleep apnea) 06/22/2011  . DOE (dyspnea on exertion) 05/30/2011  . Moderate persistent asthma 03/30/2011    Past Medical History:  Diagnosis Date  . Asthma   . Diabetes mellitus without complication ([place])   . Fibromyalgia   . Hyperlipidemia   . OSA (obstructive sleep apnea)   . Osteoarthritis   . PCOS (polycystic ovarian syndrome)   . Rheumatoid arthritis ([place])     Family History  Problem Relation  Age of Onset  . Asthma Mother   . Rheum arthritis Mother   . Allergic rhinitis Mother   . Rheum arthritis Sister   . Allergic rhinitis  Sister   . Heart failure Father   . Heart disease Father   . Rheum arthritis Sister   . Allergic rhinitis Sister   . Allergic rhinitis Brother   . Allergies Unknown        "Everyone"  . Heart disease Maternal Grandfather   . Heart disease Maternal Grandmother   . Heart disease Paternal Grandfather   . Heart disease Paternal Grandmother    Past Surgical History:  Procedure Laterality Date  . APPENDECTOMY    . CHOLECYSTECTOMY    . COLONOSCOPY W/ BIOPSIES  fall 2009  . COMBINED HYSTERECTOMY VAGINAL / OOPHORECTOMY / A&P REPAIR  10/2002   endometriosis, cystocele, fibroids  . KNEE SURGERY  1996  . VESICOVAGINAL FISTULA CLOSURE W/ TAH     Social History   Social History Narrative  . No narrative on file     Objective: Vital Signs: BP 125/74   Pulse 95   Resp 12   Ht 4' 11.5" (1.511 m)   Wt 133 lb (60.3 kg)   BMI 26.41 kg/m    Physical Exam  Constitutional: She is oriented to person, place, and time. She appears well-developed and well-nourished.  HENT:  Head: Normocephalic and atraumatic.  Eyes: Conjunctivae and EOM are normal.  Neck: Normal range of motion.  Cardiovascular: Normal rate, regular rhythm, normal heart sounds and intact distal pulses.   Pulmonary/Chest: Effort normal and breath sounds normal.  Abdominal: Soft. Bowel sounds are normal.  Lymphadenopathy:    She has no cervical adenopathy.  Neurological: She is alert and oriented to person, place, and time.  Skin: Skin is warm and dry. Capillary refill takes less than 2 seconds.  Psychiatric: She has a normal mood and affect. Her behavior is normal.  Nursing note and vitals reviewed.    Musculoskeletal Exam: C-spine discomfort with range of motion. She has some thoracic kyphosis. Lumbar spine limited range of motion. Shoulder joints elbow joints wrist joints are good range of motion. She has some discomfort over DIP joints which are thickened but no synovitis. She is some synovial thickening over bilateral  third MCP joint without synovitis. Hip joints knee joints are good range of motion with no warmth or swelling. She has some osteoarthritic changes in her feet with discomfort across MTPs and PIPs no synovitis was noted.  CDAI Exam: CDAI Homunculus Exam:   Joint Counts:  CDAI Tender Joint count: 0 CDAI Swollen Joint count: 0  Global Assessments:  Patient Global Assessment: 3 Provider Global Assessment: 3  CDAI Calculated Score: 6    Investigation: No additional findings. CBC Latest Ref Rng & Units 09/10/2016 03/03/2016 03/02/2016  WBC 3.8 - 10.8 K/uL 10.2 7.7 17.4(H)  Hemoglobin 11.7 - 15.5 g/dL 12.7 12.3 13.7  Hematocrit 35.0 - 45.0 % 37.8 35.0(L) 38.5  Platelets 140 - 400 K/uL 388 313 378   CMP     Component Value Date/Time   NA 139 09/10/2016 0916   K 4.4 09/10/2016 0916   CL 105 09/10/2016 0916   CO2 19 (L) 09/10/2016 0916   GLUCOSE 147 (H) 09/10/2016 0916   BUN 16 09/10/2016 0916   CREATININE 0.73 09/10/2016 0916   CALCIUM 10.3 09/10/2016 0916   PROT 6.3 09/10/2016 0916   ALBUMIN 4.0 09/10/2016 0916   AST 14 09/10/2016 0916   ALT 23  09/10/2016 0916   ALKPHOS 54 09/10/2016 0916   BILITOT 0.3 09/10/2016 0916   GFRNONAA >60 03/03/2016 0829   GFRNONAA 82 01/03/2016 1443   GFRAA >60 03/03/2016 0829   GFRAA >89 01/03/2016 1443    Imaging: Xr Foot 2 Views Left  Result Date: 09/25/2016 She has severe PIP/DIP narrowing and first MTP narrowing. No erosive changes were noted. No intertarsal joint space narrowing was noted. Impression: These findings are consistent with osteoarthritis of the foot she has no erosive changes currently.  Xr Foot 2 Views Right  Result Date: 09/25/2016 She has severe PIP/DIP narrowing and first MTP narrowing. No erosive changes were noted. No intertarsal joint space narrowing was noted. Impression: These findings are consistent with osteoarthritis of the foot she has no erosive changes currently.  Xr Hand 2 View Left  Result Date:  09/25/2016 All PIP and DIP narrowing was noted. Some CMC joint narrowing was noted. No MCP narrowing or intercarpal joint space narrowing was noted. No erosive changes were noted. Juxta articular osteopenia was noted. Impression: These findings are consistent with osteoarthritis and rheumatoid arthritis overlap.  Xr Hand 2 View Right  Result Date: 09/25/2016  DIP PIP narrowing was noted. Most severe narrowing was an first DIP . CMC narrowing was noted. No significant MCP joint narrowing or erosive changes were noted. She has some juxta-articular osteopenia. Impression: These findings are consistent with osteoarthritis and rheumatoid arthritis overlap.   Speciality Comments: No specialty comments available.    Procedures:  No procedures performed Allergies: Nsaids; Bactrim [sulfamethoxazole-trimethoprim]; Cephalexin; Levofloxacin; and Nitrofurantoin monohyd macro   Assessment / Plan:     Visit Diagnoses: Rheumatoid arthritis with rheumatoid factor of multiple sites without organ or systems involvement (HCC) - + anti-CCP. She has no active synovitis she appears to be doing well on current treatment.  High risk medication use - Methotrexate 6 tablets by mouth every week, folic acid 1 mg by mouth daily . Her CBC and CMP in August were stable. We will continue to check her labs every 3 months.  Pain in both hands - Plan: XR Foot 2 Views Right, XR Foot 2 Views Left  Pain in both feet - Plan: XR Hand 2 View Right, XR Hand 2 View Left  Primary osteoarthritis of both hands: Joint protection and muscle strengthening discussed.  Primary osteoarthritis of both feet: Proper fitting shoes with our support were discussed.  DJD (degenerative joint disease), cervical: Chronic pain   DDD (degenerative disc disease), lumbar: Chronic pain  HLA B27 (HLA B27 positive)  Fibromyalgia: She continues to have some generalized pain and discomfort.  Her other medical problems are listed as  follows:  Moderate persistent asthma without complication  OSA (obstructive sleep apnea)  History of diabetes mellitus  History of depression  Postmenopausal - Plan: DG DXA FRACTURE ASSESSMENT      Orders: Orders Placed This Encounter  Procedures  . XR Hand 2 View Right  . XR Hand 2 View Left  . XR Foot 2 Views Right  . XR Foot 2 Views Left  . DG DXA FRACTURE ASSESSMENT   No orders of the defined types were placed in this encounter.    Follow-Up Instructions: Return in about 5 months (around 02/25/2017) for Rheumatoid arthritis, OA, DDD.   Bo Merino, MD  Note - This record has been created using Editor, commissioning.  Chart creation errors have been sought, but may not always  have been located. Such creation errors do not reflect on  the standard  of medical care.

## 2016-09-21 DIAGNOSIS — N3946 Mixed incontinence: Secondary | ICD-10-CM | POA: Diagnosis not present

## 2016-09-21 DIAGNOSIS — N3020 Other chronic cystitis without hematuria: Secondary | ICD-10-CM | POA: Diagnosis not present

## 2016-09-25 ENCOUNTER — Ambulatory Visit (INDEPENDENT_AMBULATORY_CARE_PROVIDER_SITE_OTHER): Payer: PPO

## 2016-09-25 ENCOUNTER — Encounter: Payer: Self-pay | Admitting: Rheumatology

## 2016-09-25 ENCOUNTER — Ambulatory Visit (INDEPENDENT_AMBULATORY_CARE_PROVIDER_SITE_OTHER): Payer: PPO | Admitting: Rheumatology

## 2016-09-25 VITALS — BP 125/74 | HR 95 | Resp 12 | Ht 59.5 in | Wt 133.0 lb

## 2016-09-25 DIAGNOSIS — Z79899 Other long term (current) drug therapy: Secondary | ICD-10-CM | POA: Diagnosis not present

## 2016-09-25 DIAGNOSIS — G4733 Obstructive sleep apnea (adult) (pediatric): Secondary | ICD-10-CM | POA: Diagnosis not present

## 2016-09-25 DIAGNOSIS — M5136 Other intervertebral disc degeneration, lumbar region: Secondary | ICD-10-CM | POA: Diagnosis not present

## 2016-09-25 DIAGNOSIS — M79671 Pain in right foot: Secondary | ICD-10-CM | POA: Diagnosis not present

## 2016-09-25 DIAGNOSIS — M19041 Primary osteoarthritis, right hand: Secondary | ICD-10-CM

## 2016-09-25 DIAGNOSIS — M5030 Other cervical disc degeneration, unspecified cervical region: Secondary | ICD-10-CM | POA: Diagnosis not present

## 2016-09-25 DIAGNOSIS — M79641 Pain in right hand: Secondary | ICD-10-CM

## 2016-09-25 DIAGNOSIS — Z1589 Genetic susceptibility to other disease: Secondary | ICD-10-CM

## 2016-09-25 DIAGNOSIS — Z8639 Personal history of other endocrine, nutritional and metabolic disease: Secondary | ICD-10-CM

## 2016-09-25 DIAGNOSIS — M51369 Other intervertebral disc degeneration, lumbar region without mention of lumbar back pain or lower extremity pain: Secondary | ICD-10-CM

## 2016-09-25 DIAGNOSIS — M797 Fibromyalgia: Secondary | ICD-10-CM | POA: Diagnosis not present

## 2016-09-25 DIAGNOSIS — M47812 Spondylosis without myelopathy or radiculopathy, cervical region: Secondary | ICD-10-CM

## 2016-09-25 DIAGNOSIS — M79642 Pain in left hand: Secondary | ICD-10-CM

## 2016-09-25 DIAGNOSIS — J4540 Moderate persistent asthma, uncomplicated: Secondary | ICD-10-CM

## 2016-09-25 DIAGNOSIS — M19071 Primary osteoarthritis, right ankle and foot: Secondary | ICD-10-CM

## 2016-09-25 DIAGNOSIS — M79672 Pain in left foot: Secondary | ICD-10-CM

## 2016-09-25 DIAGNOSIS — M19072 Primary osteoarthritis, left ankle and foot: Secondary | ICD-10-CM

## 2016-09-25 DIAGNOSIS — Z8659 Personal history of other mental and behavioral disorders: Secondary | ICD-10-CM

## 2016-09-25 DIAGNOSIS — Z780 Asymptomatic menopausal state: Secondary | ICD-10-CM

## 2016-09-25 DIAGNOSIS — M19042 Primary osteoarthritis, left hand: Secondary | ICD-10-CM

## 2016-09-25 DIAGNOSIS — M0579 Rheumatoid arthritis with rheumatoid factor of multiple sites without organ or systems involvement: Secondary | ICD-10-CM | POA: Diagnosis not present

## 2016-09-25 NOTE — Patient Instructions (Signed)
Standing Labs We placed an order today for your standing lab work.    Please come back and get your standing labs in November and every 3 months  We have open lab Monday through Friday from 8:30-11:30 AM and 1:30-4 PM at the office of Dr. Mateja Dier.   The office is located at [address], [address], [place], [place] [address] No appointment is necessary.   Labs are drawn by [facility].  You may receive a bill from [facility] for your lab work. If you have any questions regarding directions or hours of operation,  please call [phone].    

## 2016-09-26 ENCOUNTER — Other Ambulatory Visit (INDEPENDENT_AMBULATORY_CARE_PROVIDER_SITE_OTHER): Payer: Self-pay | Admitting: Otolaryngology

## 2016-09-26 DIAGNOSIS — G450 Vertebro-basilar artery syndrome: Secondary | ICD-10-CM

## 2016-10-01 DIAGNOSIS — E7800 Pure hypercholesterolemia, unspecified: Secondary | ICD-10-CM | POA: Diagnosis not present

## 2016-10-01 DIAGNOSIS — E119 Type 2 diabetes mellitus without complications: Secondary | ICD-10-CM | POA: Diagnosis not present

## 2016-10-01 DIAGNOSIS — G609 Hereditary and idiopathic neuropathy, unspecified: Secondary | ICD-10-CM | POA: Diagnosis not present

## 2016-10-03 ENCOUNTER — Ambulatory Visit (HOSPITAL_COMMUNITY)
Admission: RE | Admit: 2016-10-03 | Discharge: 2016-10-03 | Disposition: A | Discharge status: Home / Self Care | Payer: PPO | Source: Ambulatory Visit | Attending: Otolaryngology | Admitting: Otolaryngology

## 2016-10-03 DIAGNOSIS — G450 Vertebro-basilar artery syndrome: Secondary | ICD-10-CM

## 2016-10-03 DIAGNOSIS — R51 Headache: Secondary | ICD-10-CM | POA: Diagnosis not present

## 2016-10-03 DIAGNOSIS — I6529 Occlusion and stenosis of unspecified carotid artery: Secondary | ICD-10-CM | POA: Diagnosis not present

## 2016-10-03 DIAGNOSIS — R42 Dizziness and giddiness: Secondary | ICD-10-CM | POA: Diagnosis not present

## 2016-10-05 ENCOUNTER — Other Ambulatory Visit: Payer: Self-pay | Admitting: Rheumatology

## 2016-10-05 NOTE — Telephone Encounter (Signed)
Last Visit: 09/25/16 Next Visit: 02/21/17   Okay to refill per Dr. Estanislado Pandy

## 2016-10-11 DIAGNOSIS — N3946 Mixed incontinence: Secondary | ICD-10-CM | POA: Diagnosis not present

## 2016-10-15 ENCOUNTER — Other Ambulatory Visit: Payer: Self-pay | Admitting: Rheumatology

## 2016-10-16 NOTE — Telephone Encounter (Signed)
Last Visit: 09/25/16 Next Visit: 02/21/17 Labs: 09/10/16 Elevated glucose  Okay to refill per Dr. Estanislado Pandy

## 2016-10-24 ENCOUNTER — Encounter: Payer: Self-pay | Admitting: Rheumatology

## 2016-10-24 DIAGNOSIS — Z780 Asymptomatic menopausal state: Secondary | ICD-10-CM | POA: Diagnosis not present

## 2016-10-24 DIAGNOSIS — N3946 Mixed incontinence: Secondary | ICD-10-CM | POA: Diagnosis not present

## 2016-10-24 DIAGNOSIS — M8589 Other specified disorders of bone density and structure, multiple sites: Secondary | ICD-10-CM | POA: Diagnosis not present

## 2016-10-29 ENCOUNTER — Ambulatory Visit (INDEPENDENT_AMBULATORY_CARE_PROVIDER_SITE_OTHER): Payer: PPO | Admitting: Otolaryngology

## 2016-10-29 DIAGNOSIS — R42 Dizziness and giddiness: Secondary | ICD-10-CM

## 2016-10-29 DIAGNOSIS — R51 Headache: Secondary | ICD-10-CM | POA: Diagnosis not present

## 2016-10-30 ENCOUNTER — Telehealth: Payer: Self-pay | Admitting: *Deleted

## 2016-10-30 NOTE — Telephone Encounter (Signed)
Bone Density Performed on 10/24/16 T-Score -2.3 [place]  Recommendations: Calcium             Vitamin D              Exercise   Repeat in 2 years.

## 2016-10-31 DIAGNOSIS — R42 Dizziness and giddiness: Secondary | ICD-10-CM | POA: Diagnosis not present

## 2016-10-31 DIAGNOSIS — Z6827 Body mass index (BMI) 27.0-27.9, adult: Secondary | ICD-10-CM | POA: Diagnosis not present

## 2016-11-22 ENCOUNTER — Other Ambulatory Visit: Payer: Self-pay | Admitting: Rheumatology

## 2016-11-23 NOTE — Telephone Encounter (Signed)
Last visit: 09/25/16 Next visit: 02/21/17 Labs: 09/10/16 elevated glucose, rest are normal.   Ok to refill per Dr. Estanislado Pandy.

## 2016-12-12 DIAGNOSIS — Z6826 Body mass index (BMI) 26.0-26.9, adult: Secondary | ICD-10-CM | POA: Diagnosis not present

## 2016-12-12 DIAGNOSIS — N390 Urinary tract infection, site not specified: Secondary | ICD-10-CM | POA: Diagnosis not present

## 2016-12-25 ENCOUNTER — Other Ambulatory Visit: Payer: Self-pay | Admitting: Rheumatology

## 2016-12-26 ENCOUNTER — Other Ambulatory Visit: Payer: Self-pay | Admitting: Allergy and Immunology

## 2016-12-26 DIAGNOSIS — J309 Allergic rhinitis, unspecified: Secondary | ICD-10-CM

## 2016-12-26 DIAGNOSIS — L281 Prurigo nodularis: Secondary | ICD-10-CM | POA: Diagnosis not present

## 2016-12-26 DIAGNOSIS — L7211 Pilar cyst: Secondary | ICD-10-CM | POA: Diagnosis not present

## 2016-12-26 NOTE — Telephone Encounter (Signed)
Last visit: 09/25/16 Next visit: 02/21/17  Ok to refill per Dr. Estanislado Pandy.

## 2017-01-07 ENCOUNTER — Encounter: Payer: Self-pay | Admitting: *Deleted

## 2017-01-09 ENCOUNTER — Encounter: Payer: Self-pay | Admitting: Diagnostic Neuroimaging

## 2017-01-09 ENCOUNTER — Ambulatory Visit: Payer: PPO | Admitting: Diagnostic Neuroimaging

## 2017-01-09 VITALS — BP 133/85 | HR 96 | Ht 59.5 in | Wt 134.6 lb

## 2017-01-09 DIAGNOSIS — Z636 Dependent relative needing care at home: Secondary | ICD-10-CM

## 2017-01-09 DIAGNOSIS — G43009 Migraine without aura, not intractable, without status migrainosus: Secondary | ICD-10-CM

## 2017-01-09 DIAGNOSIS — G4700 Insomnia, unspecified: Secondary | ICD-10-CM | POA: Diagnosis not present

## 2017-01-09 DIAGNOSIS — H538 Other visual disturbances: Secondary | ICD-10-CM

## 2017-01-09 DIAGNOSIS — H8113 Benign paroxysmal vertigo, bilateral: Secondary | ICD-10-CM | POA: Diagnosis not present

## 2017-01-09 DIAGNOSIS — G4489 Other headache syndrome: Secondary | ICD-10-CM

## 2017-01-09 DIAGNOSIS — G4733 Obstructive sleep apnea (adult) (pediatric): Secondary | ICD-10-CM | POA: Diagnosis not present

## 2017-01-09 DIAGNOSIS — IMO0001 Reserved for inherently not codable concepts without codable children: Secondary | ICD-10-CM

## 2017-01-09 DIAGNOSIS — H8143 Vertigo of central origin, bilateral: Secondary | ICD-10-CM

## 2017-01-09 MED ORDER — RIZATRIPTAN BENZOATE 10 MG PO TBDP: 10.0000 mg | ORAL_TABLET | ORAL | 11 refills | Status: DC | PRN

## 2017-01-09 NOTE — Progress Notes (Signed)
[facility]  PATIENT: Stephanie Norris DOB: [birth year]  REFERRING CLINICIAN: Merlyn Albert, MD  HISTORY FROM: patient and chart review REASON FOR VISIT: new consult    HISTORICAL  CHIEF COMPLAINT:  Chief Complaint  Patient presents with  . Dizziness    rm 7, New Pt, "have had dizziness for years, gotten worse this past year, starts when I get my head at 80 degree angle"    HISTORY OF PRESENT ILLNESS:   61 year old female with rheumatoid arthritis, osteoporosis, diabetes, hypercholesterolemia, depression, fibromyalgia, here for evaluation of headaches and dizziness.  The past 1 year patient has had onset of headache pressure of blood rushing sensation in head, 1-2 times per week, associated with photophobia, phonophobia, dizziness, lasting up to 1 day at a time.  Sometimes she has a weird feeling on the top of her head which moves around.  Sometimes she has spinning sensation.  She has used Excedrin Migraine with mild relief.  Her sister also has migraines.  Patient never had similar symptoms in the past.   REVIEW OF SYSTEMS: Full 14 system review of systems performed and negative with exception of: Spinning sensation ringing in ears fatigue blurred vision easy bruising memory loss headache dizziness depression not enough sleep racing thoughts allergies aching muscles insomnia snoring.   ALLERGIES: Allergies  Allergen Reactions  . Nsaids Anaphylaxis  . Bactrim [Sulfamethoxazole-Trimethoprim]     Upset stomach  . Cephalexin     rash  . Levofloxacin     rash  . Nitrofurantoin Monohyd Macro     rash    HOME MEDICATIONS: Outpatient Medications Prior to Visit  Medication Sig Dispense Refill  . albuterol (PROVENTIL HFA;VENTOLIN HFA) 108 (90 Base) MCG/ACT inhaler Inhale 2 puffs into the lungs every 4 (four) hours as needed for wheezing or shortness of breath.    . Alpha-Lipoic Acid 200 MG CAPS Take 1 capsule by mouth at bedtime.     . ALPRAZolam (XANAX) 0.25 MG  tablet Take 0.25 mg by mouth at bedtime.     . diclofenac sodium (VOLTAREN) 1 % GEL Apply 2 g topically daily as needed (pain).     . DULoxetine (CYMBALTA) 60 MG capsule TAKE 1 CAPSULE BY MOUTH EVERY DAY 90 capsule 0  . fluticasone (FLONASE) 50 MCG/ACT nasal spray Place 1 spray into both nostrils daily.     . fluticasone furoate-vilanterol (BREO ELLIPTA) 100-25 MCG/INH AEPB Inhale 1 puff into the lungs daily. 1 each 3  . folic acid (FOLVITE) 1 MG tablet Take 2 tablets (2 mg total) by mouth daily. 180 tablet 4  . ipratropium (ATROVENT) 0.06 % nasal spray USE 2 SPRAYS IN EACH NOSTRIL THREE TIMES DAILY 45 mL 0  . levocetirizine (XYZAL) 5 MG tablet Take 5 mg by mouth every evening.    . metFORMIN (GLUCOPHAGE-XR) 500 MG 24 hr tablet Take 1,000 mg by mouth 2 (two) times daily.     . methotrexate (RHEUMATREX) 2.5 MG tablet TAKE 6 TABLETS BY MOUTH 1 TIME EVERY WEEK 72 tablet 0  . montelukast (SINGULAIR) 10 MG tablet Take 1 tablet (10 mg total) by mouth at bedtime. 30 tablet 2  . Multiple Vitamins-Minerals (MULTIVITAMIN PO) Take by mouth.    . Omega-3 Fatty Acids (FISH OIL) 1000 MG CAPS Take 1 capsule by mouth daily.    . pantoprazole (PROTONIX) 40 MG tablet Take 1 tablet by mouth 2 (two) times daily.   5  . dicyclomine (BENTYL) 20 MG tablet Take 1 tablet (20 mg total)  by mouth 2 (two) times daily. (Patient not taking: Reported on 09/25/2016) 20 tablet 0  . Nutritional Supplements (ESTROVEN PO) Take 1 tablet by mouth daily.     Glory Rosebush VERIO test strip     . triamcinolone cream (KENALOG) 0.1 % APPLY ON THE SKIN BID  0   No facility-administered medications prior to visit.     PAST MEDICAL HISTORY: Past Medical History:  Diagnosis Date  . Asthma   . COPD (chronic obstructive pulmonary disease) ([place])   . Diabetes mellitus without complication (HCC)    Type 2  . Dizziness   . Fibromyalgia   . GERD (gastroesophageal reflux disease)   . Headache   . Hyperlipidemia   . OSA (obstructive sleep  apnea)   . Osteoarthritis   . PCOS (polycystic ovarian syndrome)   . Rheumatoid arthritis ([place])     PAST SURGICAL HISTORY: Past Surgical History:  Procedure Laterality Date  . APPENDECTOMY    . CHOLECYSTECTOMY    . COLONOSCOPY W/ BIOPSIES  fall 2009  . COMBINED HYSTERECTOMY VAGINAL / OOPHORECTOMY / A&P REPAIR  10/2002   endometriosis, cystocele, fibroids  . KNEE SURGERY  1996  . VESICOVAGINAL FISTULA CLOSURE W/ TAH      FAMILY HISTORY: Family History  Problem Relation Age of Onset  . Asthma Mother   . Rheum arthritis Mother   . Allergic rhinitis Mother   . Rheum arthritis Sister   . Allergic rhinitis Sister   . Heart failure Father   . Heart disease Father   . Rheum arthritis Sister   . Allergic rhinitis Sister   . Allergic rhinitis Brother   . Allergies Unknown        "Everyone"  . Heart disease Maternal Grandfather   . Heart disease Maternal Grandmother   . Heart disease Paternal Grandfather   . Heart disease Paternal Grandmother     SOCIAL HISTORY:  Social History   Socioeconomic History  . Marital status: Married    Spouse name: Not on file  . Number of children: 0  . Years of education: Not on file  . Highest education level: Not on file  Social Needs  . Financial resource strain: Not on file  . Food insecurity - worry: Not on file  . Food insecurity - inability: Not on file  . Transportation needs - medical: Not on file  . Transportation needs - non-medical: Not on file  Occupational History  . Occupation: Patient Account Rep    Employer: [employer]  Tobacco Use  . Smoking status: Never Smoker  . Smokeless tobacco: Never Used  Substance and Sexual Activity  . Alcohol use: No  . Drug use: No  . Sexual activity: Not on file  Other Topics Concern  . Not on file  Social History Narrative   Lives with husband   Retired    no children   Assoc degree   16 oz caffeine daily     PHYSICAL EXAM  GENERAL EXAM/CONSTITUTIONAL: Vitals:    Vitals:   01/09/17 1249  BP: 133/85  Pulse: 96  Weight: 134 lb 9.6 oz (61.1 kg)  Height: 4' 11.5" (1.511 m)     Body mass index is 26.73 kg/m.  Visual Acuity Screening   Right eye Left eye Both eyes  Without correction:     With correction: 20/30 20/30      Patient is in no distress; well developed, nourished and groomed; neck is supple  CARDIOVASCULAR:  Examination of carotid  arteries is normal; no carotid bruits  Regular rate and rhythm, no murmurs  Examination of peripheral vascular system by observation and palpation is normal  EYES:  Ophthalmoscopic exam of optic discs and posterior segments is normal; no papilledema or hemorrhages  MUSCULOSKELETAL:  Gait, strength, tone, movements noted in Neurologic exam below  NEUROLOGIC: MENTAL STATUS:  No flowsheet data found.  awake, alert, oriented to person, place and time  recent and remote memory intact  normal attention and concentration  language fluent, comprehension intact, naming intact,   fund of knowledge appropriate  CRANIAL NERVE:   2nd - no papilledema on fundoscopic exam  2nd, 3rd, 4th, 6th - pupils equal and reactive to light, visual fields full to confrontation, extraocular muscles intact, no nystagmus  5th - facial sensation symmetric  7th - facial strength symmetric  8th - hearing intact  9th - palate elevates symmetrically, uvula midline  11th - shoulder shrug symmetric  12th - tongue protrusion midline  MOTOR:   normal bulk and tone, full strength in the BUE, BLE  SENSORY:   normal and symmetric to light touch, temperature, vibration  COORDINATION:   finger-nose-finger, fine finger movements normal  REFLEXES:   deep tendon reflexes present and symmetric  GAIT/STATION:   narrow based gait    DIAGNOSTIC DATA (LABS, IMAGING, TESTING) - I reviewed patient records, labs, notes, testing and imaging myself where available.  Lab Results  Component Value Date   WBC  10.2 09/10/2016   HGB 12.7 09/10/2016   HCT 37.8 09/10/2016   MCV 93.1 09/10/2016   PLT 388 09/10/2016      Component Value Date/Time   NA 139 09/10/2016 0916   K 4.4 09/10/2016 0916   CL 105 09/10/2016 0916   CO2 19 (L) 09/10/2016 0916   GLUCOSE 147 (H) 09/10/2016 0916   BUN 16 09/10/2016 0916   CREATININE 0.73 09/10/2016 0916   CALCIUM 10.3 09/10/2016 0916   PROT 6.3 09/10/2016 0916   ALBUMIN 4.0 09/10/2016 0916   AST 14 09/10/2016 0916   ALT 23 09/10/2016 0916   ALKPHOS 54 09/10/2016 0916   BILITOT 0.3 09/10/2016 0916   GFRNONAA >60 03/03/2016 0829   GFRNONAA 82 01/03/2016 1443   GFRAA >60 03/03/2016 0829   GFRAA >89 01/03/2016 1443   No results found for: CHOL, HDL, LDLCALC, LDLDIRECT, TRIG, CHOLHDL Lab Results  Component Value Date   HGBA1C 6.7 (H) 03/02/2016   No results found for: VITAMINB12 No results found for: TSH   03/10/10 MRI brain [I reviewed images myself and agree with interpretation. -VRP]  - Normal examination.  No acute or traumatic finding.  10/03/16 MRA head [I reviewed images myself and agree with interpretation. -VRP]  1. Patent circle of Willis. No large vessel occlusion, aneurysm, or significant stenosis is identified. 2. Mild atherosclerotic irregularity of distal cavernous ICA segments with mild stenosis. 3. No evidence for intracranial vertebrobasilar insufficiency.     ASSESSMENT AND PLAN  61 y.o. year old female here with new onset headaches and dizziness for past 1 year, with migraine features.  Will check MRI to rule out secondary causes.   Dx:  1. Migraine without aura and without status migrainosus, not intractable   2. Benign paroxysmal positional vertigo due to bilateral vestibular disorder   3. OSA (obstructive sleep apnea)   4. Insomnia, unspecified type   5. Caregiver stress   6. Blurred vision      PLAN:  HEADACHES / DIZZINESS (migraine vs secondary cause; BPPV vs  other cause) - MRI brain and IAC (with and  without)  - rizatriptan as needed - consider propranolol for migraine prevention (could not tolerated topiramate in the past)  SLEEP APNEA - restart CPAP; follow up with Dr. Halford Chessman  INSOMNIA / ANXIETY / CAREGIVER STRESS - consider psychiatry / psychology eval  BLURRED VISION - follow up with eye doctor (glasses)  Orders Placed This Encounter  Procedures  . MR BRAIN/IAC W WO CONTRAST   Meds ordered this encounter  Medications  . rizatriptan (MAXALT-MLT) 10 MG disintegrating tablet    Sig: Take 1 tablet (10 mg total) by mouth as needed for migraine. May repeat in 2 hours if needed    Dispense:  9 tablet    Refill:  11   Return in about 6 months (around 07/10/2017).    Penni Bombard, MD 76/09/1155, 2:62 PM Certified in Neurology, Neurophysiology and Neuroimaging  [facility] [address] [place], [place] [address] [phone]

## 2017-01-09 NOTE — Patient Instructions (Addendum)
  HEADACHES / DIZZINESS - MRI brain and IAC (with and without) - rizatriptan as needed - consider propranolol for migraine prevention  SLEEP APNEA - restart CPAP; follow up with Dr. Halford Chessman  INSOMNIA / ANXIETY / CAREGIVER STRESS - consider psychiatry / psychology eval  BLURRED VISION - follow up with eye doctor (glasses)

## 2017-01-18 ENCOUNTER — Telehealth: Payer: Self-pay | Admitting: Diagnostic Neuroimaging

## 2017-01-18 NOTE — Telephone Encounter (Signed)
Patient returned your call on Friday MRI . I told her that you were out of the office. Patient was fine with this plan  . Patient relayed you can call her on Monday.

## 2017-01-21 NOTE — Telephone Encounter (Signed)
Patient is scheduled for 01/25/17 at [facility].

## 2017-01-25 ENCOUNTER — Ambulatory Visit (HOSPITAL_COMMUNITY)
Admission: RE | Admit: 2017-01-25 | Discharge: 2017-01-25 | Disposition: A | Discharge status: Home / Self Care | Payer: PPO | Source: Ambulatory Visit | Attending: Diagnostic Neuroimaging | Admitting: Diagnostic Neuroimaging

## 2017-01-25 DIAGNOSIS — IMO0001 Reserved for inherently not codable concepts without codable children: Secondary | ICD-10-CM

## 2017-01-25 DIAGNOSIS — G4489 Other headache syndrome: Secondary | ICD-10-CM | POA: Insufficient documentation

## 2017-01-25 DIAGNOSIS — H8143 Vertigo of central origin, bilateral: Secondary | ICD-10-CM | POA: Insufficient documentation

## 2017-01-25 DIAGNOSIS — R51 Headache: Secondary | ICD-10-CM | POA: Diagnosis not present

## 2017-01-25 LAB — POCT I-STAT CREATININE: Creatinine, Ser: 0.7 mg/dL (ref 0.44–1.00)

## 2017-01-25 MED ORDER — GADOBENATE DIMEGLUMINE 529 MG/ML IV SOLN
15.0000 mL | Freq: Once | INTRAVENOUS | Status: AC | PRN
  Administered 2017-01-25: 12 mL via INTRAVENOUS

## 2017-01-30 ENCOUNTER — Telehealth: Payer: Self-pay | Admitting: *Deleted

## 2017-01-30 NOTE — Telephone Encounter (Signed)
-----   Message from Penni Bombard, MD sent at 01/25/2017  3:05 PM EST ----- Unremarkable imaging results. Please call patient. Continue current plan. -VRP

## 2017-01-30 NOTE — Telephone Encounter (Signed)
Spoke to pt and relayed that her MRI brain results were unremarkable, and to continue current plan.  She verbalized understanding.

## 2017-02-08 NOTE — Progress Notes (Signed)
Office Visit Note  Patient: Stephanie Norris             Date of Birth: 1955-05-06           MRN: [id number]             PCP: Celene Squibb, MD Referring: Celene Squibb, MD Visit Date: 02/21/2017 Occupation: _0 @    Subjective:  Hand pain   History of Present Illness: [name] is a 62 y.o. female with history of seropositive rheumatoid arthritis and osteoarthritis.  She is on MTX 6 tablets weekly and folic acid 2 mg.  She states that she is having pain in her left 2nd DIP joint.  She states her left 1st MTP also hurts occasionally.  She denies an joint swelling at this time.  She states she has noticed her knee having a "catching" sensation when going downstairs.  She denies any pain or swelling.  She denies any injury or giving out of the knee.  She denies any popping sensation.   She states her fibromyalgia is well controlled.  She continues to have fatigue and insomnia.  She has trapezius muscle tension and muscle tenderness.  She was previously going to water aerobics, which helped her upper extremity muscle tenderness.  She is still on Cymbalta 60 mg daily.     Activities of Daily Living:  Patient reports morning stiffness for 10-15 minutes.   Patient Denies nocturnal pain.  Difficulty dressing/grooming: Denies Difficulty climbing stairs: Denies Difficulty getting out of chair: Denies Difficulty using hands for taps, buttons, cutlery, and/or writing: Denies   Review of Systems  Constitutional: Positive for fatigue. Negative for weakness.  HENT: Positive for mouth dryness. Negative for mouth sores and nose dryness.   Eyes: Positive for dryness. Negative for redness.  Respiratory: Negative for cough, hemoptysis, shortness of breath and difficulty breathing.   Cardiovascular: Negative for chest pain, palpitations, hypertension and swelling in legs/feet.  Gastrointestinal: Negative for blood in stool, constipation and diarrhea.  Endocrine: Negative for increased  urination.  Genitourinary: Negative for painful urination.  Musculoskeletal: Positive for arthralgias, joint pain, morning stiffness and muscle tenderness. Negative for joint swelling, myalgias, muscle weakness and myalgias.  Skin: Negative for color change, pallor, rash, hair loss, nodules/bumps, redness, skin tightness, ulcers and sensitivity to sunlight.  Neurological: Positive for headaches (Mild). Negative for dizziness and numbness.  Hematological: Negative for swollen glands.  Psychiatric/Behavioral: Positive for sleep disturbance. Negative for depressed mood. The patient is nervous/anxious.     PMFS History:  Patient Active Problem List   Diagnosis Date Noted  . Vertigo 08/20/2016  . Eustachian tube dysfunction 04/17/2016  . Abdominal pain 03/02/2016  . Gastroenteritis 03/02/2016  . Rheumatoid arthritis with rheumatoid factor of multiple sites without organ or systems involvement ([place]) 01/13/2016  . HLA B27 (HLA B27 positive) 01/13/2016  . Osteoarthritis of foot 01/13/2016  . DJD (degenerative joint disease), cervical 01/13/2016  . Spondylosis of lumbar region without myelopathy or radiculopathy 01/13/2016  . Primary osteoarthritis of both hands 01/13/2016  . Perennial allergic rhinosinusitis 08/13/2014  . Stiffness of joints, not elsewhere classified, multiple sites 08/26/2013  . Flu-like symptoms 03/02/2013  . Lactic acidosis 03/02/2013  . Sinus tachycardia 03/02/2013  . Elevated lactic acid level 03/02/2013  . Influenza due to identified novel influenza A virus with other respiratory manifestations 03/02/2013  . Hyperglycemia 03/02/2013  . Fibromyalgia   . OSA (obstructive sleep apnea) 06/22/2011  . DOE (dyspnea on exertion) 05/30/2011  . Moderate  persistent asthma 03/30/2011    Past Medical History:  Diagnosis Date  . Asthma   . COPD (chronic obstructive pulmonary disease) ([place])   . Diabetes mellitus without complication (HCC)    Type 2  . Dizziness   .  Fibromyalgia   . GERD (gastroesophageal reflux disease)   . Headache   . Hyperlipidemia   . OSA (obstructive sleep apnea)   . Osteoarthritis   . PCOS (polycystic ovarian syndrome)   . Rheumatoid arthritis ([place])     Family History  Problem Relation Age of Onset  . Asthma Mother   . Rheum arthritis Mother   . Allergic rhinitis Mother   . Rheum arthritis Sister   . Allergic rhinitis Sister   . Heart failure Father   . Heart disease Father   . Rheum arthritis Sister   . Allergic rhinitis Sister   . Allergic rhinitis Brother   . Allergies Unknown        "Everyone"  . Heart disease Maternal Grandfather   . Heart disease Maternal Grandmother   . Heart disease Paternal Grandfather   . Heart disease Paternal Grandmother    Past Surgical History:  Procedure Laterality Date  . APPENDECTOMY    . CHOLECYSTECTOMY    . COLONOSCOPY W/ BIOPSIES  fall 2009  . COMBINED HYSTERECTOMY VAGINAL / OOPHORECTOMY / A&P REPAIR  10/2002   endometriosis, cystocele, fibroids  . KNEE SURGERY  1996  . VESICOVAGINAL FISTULA CLOSURE W/ TAH     Social History   Social History Narrative   Lives with husband   Retired    no children   Assoc degree   16 oz caffeine daily     Objective: Vital Signs: BP 129/85 (BP Location: Left Arm, Patient Position: Sitting, Cuff Size: Normal)   Pulse 88   Resp 14   Ht 4' 11.5" (1.511 m)   Wt 135 lb (61.2 kg)   BMI 26.81 kg/m    Physical Exam  Constitutional: She is oriented to person, place, and time. She appears well-developed and well-nourished.  HENT:  Head: Normocephalic and atraumatic.  Eyes: Conjunctivae and EOM are normal.  Neck: Normal range of motion.  Cardiovascular: Normal rate, regular rhythm, normal heart sounds and intact distal pulses.  Pulmonary/Chest: Effort normal and breath sounds normal.  Abdominal: Soft. Bowel sounds are normal.  Lymphadenopathy:    She has no cervical adenopathy.  Neurological: She is alert and oriented to person,  place, and time.  Skin: Skin is warm and dry. Capillary refill takes less than 2 seconds.  Psychiatric: She has a normal mood and affect. Her behavior is normal.  Nursing note and vitals reviewed.    Musculoskeletal Exam: C-spine, thoracic, and lumbar spine good ROM.  Shoulder joints, elbow joints, wrist joints, MCPs, PIPs, and DIPs good ROM with no synovitis. PIP and DIP synovial thickening consistent with osteoarthritis.  She has tenderness of right second DIP joint.  Hip joints, knee joints, ankle joints, MTPs, PIPs, and DIPs good ROM.  No warmth or effusion of knees.  Hammer toe of left great toe.  PIP and DIP synovial thickening consistent with osteoarthritis of feet.  No trochanteric bursa tenderness.    CDAI Exam: CDAI Homunculus Exam:   Joint Counts:  CDAI Tender Joint count: 0 CDAI Swollen Joint count: 0  Global Assessments:  Patient Global Assessment: 3 Provider Global Assessment: 3  CDAI Calculated Score: 6    Investigation: No additional findings. CBC Latest Ref Rng & Units 09/10/2016 03/03/2016 03/02/2016  WBC 3.8 - 10.8 K/uL 10.2 7.7 17.4(H)  Hemoglobin 11.7 - 15.5 g/dL 12.7 12.3 13.7  Hematocrit 35.0 - 45.0 % 37.8 35.0(L) 38.5  Platelets 140 - 400 K/uL 388 313 378   CMP Latest Ref Rng & Units 01/25/2017 09/10/2016 03/03/2016  Glucose 65 - 99 mg/dL - 147(H) 148(H)  BUN 7 - 25 mg/dL - 16 8  Creatinine 0.44 - 1.00 mg/dL 0.70 0.73 0.73  Sodium 135 - 146 mmol/L - 139 139  Potassium 3.5 - 5.3 mmol/L - 4.4 3.7  Chloride 98 - 110 mmol/L - 105 106  CO2 20 - 32 mmol/L - 19(L) 25  Calcium 8.6 - 10.4 mg/dL - 10.3 8.7(L)  Total Protein 6.1 - 8.1 g/dL - 6.3 -  Total Bilirubin 0.2 - 1.2 mg/dL - 0.3 -  Alkaline Phos 33 - 130 U/L - 54 -  AST 10 - 35 U/L - 14 -  ALT 6 - 29 U/L - 23 -    Imaging: Mr Albertina [place] Wo Contrast  Result Date: 01/25/2017 CLINICAL DATA:  Chronic headache.  Bilateral tinnitus. EXAM: MRI HEAD WITHOUT AND WITH CONTRAST TECHNIQUE: Multiplanar, multiecho  pulse sequences of the brain and surrounding structures were obtained without and with intravenous contrast. CONTRAST:  1m MULTIHANCE GADOBENATE DIMEGLUMINE 529 MG/ML IV SOLN COMPARISON:  MR angiography 10/03/2016.  MRI brain 03/10/2010. FINDINGS: Brain: Diffusion imaging does not show any acute or subacute infarction. The brainstem and cerebellum are normal. CP angle regions are normal. Seventh and eighth nerve complexes are normal. No vestibular schwannoma or enhancing neuritis. No fluid in the middle ears or mastoids. Cerebral hemispheres show background pattern of chronically prominent perivascular spaces. There is mild chronic small-vessel ischemic change of the deep white matter. No cortical or large vessel territory infarction. No mass lesion, hemorrhage, hydrocephalus or extra-axial collection. Vascular: Major vessels at the base of the brain show flow. Skull and upper cervical spine: Negative Sinuses/Orbits: Clear/normal Other: None IMPRESSION: No cause of headache or tinnitus identified. Mild chronic small-vessel ischemic change of the hemispheric white matter, progressive since 2012. Electronically Signed   By: MNelson ChimesM.D.   On: 01/25/2017 14:14    Speciality Comments: No specialty comments available.    Procedures:  No procedures performed Allergies: Nsaids; Bactrim [sulfamethoxazole-trimethoprim]; Cephalexin; Levofloxacin; and Nitrofurantoin monohyd macro   Assessment / Plan:     Visit Diagnoses: Rheumatoid arthritis with rheumatoid factor of multiple sites without organ or systems involvement (HCC) - + anti-CCP: She has no synovitis on exam.  She has pain in left 2nd DIP joint, which is consistent with osteoarthritis.  She will continue on MTX 6 tablets weekly and folic acid 2 mg.  She did not need any refills today.   HLA B27 (HLA B27 positive)  High risk medication use - Methotrexate 6 tablets by mouth every week, folic acid 1 mg by mouth daily -CBC and CMP were drawn today.  Plan: CBC with Differential/Platelet, COMPLETE METABOLIC PANEL WITH GFR  Primary osteoarthritis of both hands: She has PIP and DIP synovial thickening.  She was given a refill of Voltaren gel.  In her allergies, NSAIDs are listed as causing anaphylaxis.  Patient states she has been using Voltaren gel for a long time and has never had a reaction.  She requested a refill.  She states that she can also take ibuprofen 1 tablet daily with no reaction.  Explain that if she experiences any signs of an allergic reaction to discontinue use of Voltaren gel.  She  was given a handout listing natural anti-inflammatories that she can try.  Tumeric caused GI upset.      Primary osteoarthritis of both feet: She has PIP and DIP synovial thickening.  She was advised to be fitted for proper fitting shoes.  She was given a prescription for orthotics through [facility].  She may require a metatarsal pad due to developing a hammer toe of her left great toe.    DDD (degenerative disc disease), cervical: Good ROM.  Chronic pain.  DDD (degenerative disc disease), lumbar: Chronic pain.  She has midline spinal tenderness in the lumbar region.    Fibromyalgia: She reports her fibromyalgia is well controlled at this time.  She has had no flares.  She will continue on Cymbalta 60 mg.  She continues to have insomnia and fatigue.  She is going to start going to physical therapy again.    Other fatigue -Patient requested her TSH to be checked.  She has a family history of hypothyroidism.   She has been having increased fatigue. Plan: TSH   Other medical conditions are listed as follows:   History of depression  History of asthma  History of diabetes mellitus  History of sleep apnea     Orders: Orders Placed This Encounter  Procedures  . CBC with Differential/Platelet  . COMPLETE METABOLIC PANEL WITH GFR  . TSH   Meds ordered this encounter  Medications  . diclofenac sodium (VOLTAREN) 1 % GEL    Sig: Apply 3 grams  to three large joints up to three times daily as needed    Dispense:  3 Tube    Refill:  3    Face-to-face time spent with patient was 30 minutes. Greater than 50% of time was spent in counseling and coordination of care.  Follow-Up Instructions: Return in about 5 months (around 07/22/2017) for Osteoarthritis, Fibromyalgia.   Ofilia Neas, PA-C  Note - This record has been created using Dragon software.  Chart creation errors have been sought, but may not always  have been located. Such creation errors do not reflect on  the standard of medical care.

## 2017-02-14 DIAGNOSIS — Z6826 Body mass index (BMI) 26.0-26.9, adult: Secondary | ICD-10-CM | POA: Diagnosis not present

## 2017-02-14 DIAGNOSIS — H5213 Myopia, bilateral: Secondary | ICD-10-CM | POA: Diagnosis not present

## 2017-02-14 DIAGNOSIS — G4700 Insomnia, unspecified: Secondary | ICD-10-CM | POA: Diagnosis not present

## 2017-02-21 ENCOUNTER — Encounter: Payer: Self-pay | Admitting: Rheumatology

## 2017-02-21 ENCOUNTER — Ambulatory Visit: Payer: PPO | Admitting: Rheumatology

## 2017-02-21 VITALS — BP 129/85 | HR 88 | Resp 14 | Ht 59.5 in | Wt 135.0 lb

## 2017-02-21 DIAGNOSIS — Z8709 Personal history of other diseases of the respiratory system: Secondary | ICD-10-CM

## 2017-02-21 DIAGNOSIS — M5136 Other intervertebral disc degeneration, lumbar region: Secondary | ICD-10-CM

## 2017-02-21 DIAGNOSIS — M19042 Primary osteoarthritis, left hand: Secondary | ICD-10-CM

## 2017-02-21 DIAGNOSIS — M0579 Rheumatoid arthritis with rheumatoid factor of multiple sites without organ or systems involvement: Secondary | ICD-10-CM | POA: Diagnosis not present

## 2017-02-21 DIAGNOSIS — Z8669 Personal history of other diseases of the nervous system and sense organs: Secondary | ICD-10-CM

## 2017-02-21 DIAGNOSIS — M797 Fibromyalgia: Secondary | ICD-10-CM | POA: Diagnosis not present

## 2017-02-21 DIAGNOSIS — Z8639 Personal history of other endocrine, nutritional and metabolic disease: Secondary | ICD-10-CM | POA: Diagnosis not present

## 2017-02-21 DIAGNOSIS — M19041 Primary osteoarthritis, right hand: Secondary | ICD-10-CM

## 2017-02-21 DIAGNOSIS — Z79899 Other long term (current) drug therapy: Secondary | ICD-10-CM | POA: Diagnosis not present

## 2017-02-21 DIAGNOSIS — M5030 Other cervical disc degeneration, unspecified cervical region: Secondary | ICD-10-CM | POA: Diagnosis not present

## 2017-02-21 DIAGNOSIS — Z1589 Genetic susceptibility to other disease: Secondary | ICD-10-CM | POA: Diagnosis not present

## 2017-02-21 DIAGNOSIS — R5383 Other fatigue: Secondary | ICD-10-CM | POA: Diagnosis not present

## 2017-02-21 DIAGNOSIS — M19071 Primary osteoarthritis, right ankle and foot: Secondary | ICD-10-CM | POA: Diagnosis not present

## 2017-02-21 DIAGNOSIS — Z8659 Personal history of other mental and behavioral disorders: Secondary | ICD-10-CM

## 2017-02-21 DIAGNOSIS — M19072 Primary osteoarthritis, left ankle and foot: Secondary | ICD-10-CM

## 2017-02-21 LAB — CBC WITH DIFFERENTIAL/PLATELET
Basophils Absolute: 80 cells/uL (ref 0–200)
Basophils Relative: 0.6 %
EOS PCT: 2.1 %
Eosinophils Absolute: 281 cells/uL (ref 15–500)
HEMATOCRIT: 37.3 % (ref 35.0–45.0)
Hemoglobin: 13 g/dL (ref 11.7–15.5)
LYMPHS ABS: 4248 {cells}/uL — AB (ref 850–3900)
MCH: 31.3 pg (ref 27.0–33.0)
MCHC: 34.9 g/dL (ref 32.0–36.0)
MCV: 89.7 fL (ref 80.0–100.0)
MPV: 12 fL (ref 7.5–12.5)
Monocytes Relative: 4.8 %
NEUTROS PCT: 60.8 %
Neutro Abs: 8147 cells/uL — ABNORMAL HIGH (ref 1500–7800)
Platelets: 376 10*3/uL (ref 140–400)
RBC: 4.16 10*6/uL (ref 3.80–5.10)
RDW: 13.2 % (ref 11.0–15.0)
TOTAL LYMPHOCYTE: 31.7 %
WBC mixed population: 643 cells/uL (ref 200–950)
WBC: 13.4 10*3/uL — ABNORMAL HIGH (ref 3.8–10.8)

## 2017-02-21 LAB — COMPLETE METABOLIC PANEL WITH GFR
AG RATIO: 2 (calc) (ref 1.0–2.5)
ALKALINE PHOSPHATASE (APISO): 58 U/L (ref 33–130)
ALT: 21 U/L (ref 6–29)
AST: 16 U/L (ref 10–35)
Albumin: 4.6 g/dL (ref 3.6–5.1)
BUN: 19 mg/dL (ref 7–25)
CO2: 21 mmol/L (ref 20–32)
Calcium: 10.1 mg/dL (ref 8.6–10.4)
Chloride: 101 mmol/L (ref 98–110)
Creat: 0.7 mg/dL (ref 0.50–0.99)
GFR, Est African American: 107 mL/min/{1.73_m2} (ref >=60)
GFR, Est Non African American: 92 mL/min/{1.73_m2} (ref >=60)
GLOBULIN: 2.3 g/dL (ref 1.9–3.7)
Glucose, Bld: 118 mg/dL — ABNORMAL HIGH (ref 65–99)
POTASSIUM: 4.1 mmol/L (ref 3.5–5.3)
SODIUM: 136 mmol/L (ref 135–146)
Total Bilirubin: 0.3 mg/dL (ref 0.2–1.2)
Total Protein: 6.9 g/dL (ref 6.1–8.1)

## 2017-02-21 LAB — TSH: TSH: 2.05 m[IU]/L (ref 0.40–4.50)

## 2017-02-21 MED ORDER — DICLOFENAC SODIUM 1 % TD GEL: TRANSDERMAL | 3 refills | Status: AC

## 2017-02-21 NOTE — Patient Instructions (Addendum)
Natural anti-inflammatories  You can purchase these at [employer], [employer] or online.  . Turmeric (capsules)  . Ginger (ginger root or capsules)  . Omega 3 (Fish, flax seeds, chia seeds, walnuts, almonds)  . Tart cherry (dried or extract)   Patient should be under the care of a physician while taking these supplements. This may not be reproduced without the permission of Dr. Bo Merino.   Standing Labs We placed an order today for your standing lab work.    Please come back and get your standing labs in April and every 3 months  We have open lab Monday through Friday from 8:30-11:30 AM and 1:30-4 PM at the office of Dr. Bo Merino.   The office is located at [address], [place], [place], [place] [address] No appointment is necessary.   Labs are drawn by [facility].  You may receive a bill from [facility] for your lab work. If you have any questions regarding directions or hours of operation,  please call [phone].

## 2017-02-22 NOTE — Progress Notes (Signed)
WBC elevated. Dr. Estanislado Pandy called patient to notify her of her lab results.  She denied any symptoms of recent infections or steroid use.  She was advised to notify us if she develops a fever or any other symptoms of an infection.

## 2017-02-28 DIAGNOSIS — F5101 Primary insomnia: Secondary | ICD-10-CM | POA: Diagnosis not present

## 2017-02-28 DIAGNOSIS — J449 Chronic obstructive pulmonary disease, unspecified: Secondary | ICD-10-CM | POA: Diagnosis not present

## 2017-02-28 DIAGNOSIS — E782 Mixed hyperlipidemia: Secondary | ICD-10-CM | POA: Diagnosis not present

## 2017-02-28 DIAGNOSIS — K219 Gastro-esophageal reflux disease without esophagitis: Secondary | ICD-10-CM | POA: Diagnosis not present

## 2017-02-28 DIAGNOSIS — L237 Allergic contact dermatitis due to plants, except food: Secondary | ICD-10-CM | POA: Diagnosis not present

## 2017-02-28 DIAGNOSIS — E1165 Type 2 diabetes mellitus with hyperglycemia: Secondary | ICD-10-CM | POA: Diagnosis not present

## 2017-02-28 DIAGNOSIS — N390 Urinary tract infection, site not specified: Secondary | ICD-10-CM | POA: Diagnosis not present

## 2017-02-28 DIAGNOSIS — Z6826 Body mass index (BMI) 26.0-26.9, adult: Secondary | ICD-10-CM | POA: Diagnosis not present

## 2017-02-28 DIAGNOSIS — H8110 Benign paroxysmal vertigo, unspecified ear: Secondary | ICD-10-CM | POA: Diagnosis not present

## 2017-03-01 DIAGNOSIS — E119 Type 2 diabetes mellitus without complications: Secondary | ICD-10-CM | POA: Diagnosis not present

## 2017-03-01 DIAGNOSIS — K219 Gastro-esophageal reflux disease without esophagitis: Secondary | ICD-10-CM | POA: Diagnosis not present

## 2017-03-01 DIAGNOSIS — E782 Mixed hyperlipidemia: Secondary | ICD-10-CM | POA: Diagnosis not present

## 2017-03-01 DIAGNOSIS — J309 Allergic rhinitis, unspecified: Secondary | ICD-10-CM | POA: Diagnosis not present

## 2017-03-11 DIAGNOSIS — J309 Allergic rhinitis, unspecified: Secondary | ICD-10-CM | POA: Diagnosis not present

## 2017-03-11 DIAGNOSIS — K219 Gastro-esophageal reflux disease without esophagitis: Secondary | ICD-10-CM | POA: Diagnosis not present

## 2017-03-11 DIAGNOSIS — E782 Mixed hyperlipidemia: Secondary | ICD-10-CM | POA: Diagnosis not present

## 2017-03-11 DIAGNOSIS — Z6826 Body mass index (BMI) 26.0-26.9, adult: Secondary | ICD-10-CM | POA: Diagnosis not present

## 2017-03-11 DIAGNOSIS — N390 Urinary tract infection, site not specified: Secondary | ICD-10-CM | POA: Diagnosis not present

## 2017-03-11 DIAGNOSIS — E119 Type 2 diabetes mellitus without complications: Secondary | ICD-10-CM | POA: Diagnosis not present

## 2017-03-19 ENCOUNTER — Other Ambulatory Visit: Payer: Self-pay | Admitting: Rheumatology

## 2017-03-20 NOTE — Telephone Encounter (Signed)
Last Visit: 02/21/17 Next visit: 08/27/17  Okay to refill per Dr. Estanislado Pandy

## 2017-03-27 DIAGNOSIS — E119 Type 2 diabetes mellitus without complications: Secondary | ICD-10-CM | POA: Diagnosis not present

## 2017-03-27 DIAGNOSIS — E782 Mixed hyperlipidemia: Secondary | ICD-10-CM | POA: Diagnosis not present

## 2017-03-28 DIAGNOSIS — E781 Pure hyperglyceridemia: Secondary | ICD-10-CM | POA: Diagnosis not present

## 2017-03-28 DIAGNOSIS — E119 Type 2 diabetes mellitus without complications: Secondary | ICD-10-CM | POA: Diagnosis not present

## 2017-03-28 DIAGNOSIS — G4700 Insomnia, unspecified: Secondary | ICD-10-CM | POA: Diagnosis not present

## 2017-03-28 DIAGNOSIS — Z6825 Body mass index (BMI) 25.0-25.9, adult: Secondary | ICD-10-CM | POA: Diagnosis not present

## 2017-03-28 DIAGNOSIS — R7301 Impaired fasting glucose: Secondary | ICD-10-CM | POA: Diagnosis not present

## 2017-04-17 ENCOUNTER — Other Ambulatory Visit: Payer: Self-pay | Admitting: Allergy and Immunology

## 2017-04-17 DIAGNOSIS — J4540 Moderate persistent asthma, uncomplicated: Secondary | ICD-10-CM

## 2017-04-25 ENCOUNTER — Encounter: Payer: Self-pay | Admitting: Family Medicine

## 2017-04-25 ENCOUNTER — Ambulatory Visit: Payer: PPO | Admitting: Family Medicine

## 2017-04-25 VITALS — BP 130/72 | HR 88 | Resp 28

## 2017-04-25 DIAGNOSIS — J3089 Other allergic rhinitis: Secondary | ICD-10-CM

## 2017-04-25 DIAGNOSIS — J4540 Moderate persistent asthma, uncomplicated: Secondary | ICD-10-CM

## 2017-04-25 DIAGNOSIS — R42 Dizziness and giddiness: Secondary | ICD-10-CM

## 2017-04-25 MED ORDER — FLUTICASONE FUROATE-VILANTEROL 100-25 MCG/INH IN AEPB: 1.0000 | INHALATION_SPRAY | Freq: Every day | RESPIRATORY_TRACT | 3 refills | Status: DC

## 2017-04-25 NOTE — Progress Notes (Signed)
[address] [place] [address] Dept: [phone]  FOLLOW UP NOTE  Patient ID: Stephanie Norris, female    DOB: 09-27-1955  Age: 62 y.o. MRN: [id number] Date of Office Visit: 04/25/2017  Assessment  Chief Complaint: Asthma (doing very good)  HPI Stephanie Norris is a 62 year old female who presents to the clinic today for a follow-up visit.  She was last seen in this clinic on 08/20/2016 by Dr. Verlin Fester for evaluation of persistent asthma, mixed rhinitis, and eustachian tube dysfunction.  At that visit, she continued using Brio 100-1 inhalation daily, montelukast 10 mg once a day at bedtime, and albuterol inhaler as needed.  Her mixed rhinitis was controlled with daily use of Atrovent nasal spray and Flonase with occasional saline nasal rinses.  A referral was made to [facility], otolaryngology, for further evaluation and treatment of vertigo.   At today's visit, Stephanie Norris reports she has done well overall.Stephanie Norris's asthma has been well controlled. She has not required rescue medication, experienced nocturnal awakenings due to lower respiratory symptoms, nor have activities of daily living been limited. She has required no Emergency Department or Urgent Care visits for her asthma. She has required zero courses of systemic steroids for asthma exacerbations since the last visit. ACT score today is 25, indicating excellent asthma symptom control.  She continues Brio Ellipta 100-1 inhalation once a day, montelukast 10 mg once a day at bedtime, and albuterol 1 time over the last several months.  Mixed rhinitis is reported is moderately well controlled.  She reports she experiences thick postnasal drainage in her throat each morning which resolves when she gets up and begins moving around, however, at that time she begins to experience watery drainage dripping from both nostrils.  She is currently using Atrovent nasal spray and Flonase daily.  She is not using nasal saline rinses nor is she using an  antihistamine.  Stephanie Norris reports her vertigo has completely resolved.  She reports that she saw Dr.Teoh and had a head MRA from 10/03/2016 which revealed: 1. Patent circle of Willis. No large vessel occlusion, aneurysm, or significant stenosis is identified. 2. Mild atherosclerotic irregularity of distal cavernous ICA segments with mild stenosis. A second MRI head on 01/25/2017 revealed: Mild chronic small-vessel ischemic change of the hemispheric white matter, progressive since 2012. After the second MRI she reports a drastic reduction in symptoms.   Her current medications are listed in her chart.  Drug Allergies:  Allergies  Allergen Reactions  . Nsaids Anaphylaxis  . Bactrim [Sulfamethoxazole-Trimethoprim]     Upset stomach  . Cephalexin     rash  . Levofloxacin     rash  . Nitrofurantoin Monohyd Macro     rash    Physical Exam: BP 130/72   Pulse 88   Resp (!) 28    Physical Exam  Constitutional: She appears well-developed and well-nourished.  HENT:  Head: Normocephalic and atraumatic.  Right Ear: External ear normal.  Left Ear: External ear normal.  Bilateral nares slightly erythematous and edematous with a clear nasal drainage.  Pharynx slightly erythematous with a cobblestone appearance.  Eyes normal.  Ears normal.  Eyes: Conjunctivae are normal.  Neck: Normal range of motion. Neck supple.  Cardiovascular: Normal rate, regular rhythm and normal heart sounds.  No murmur noted.  Pulmonary/Chest: Effort normal and breath sounds normal.  Lungs clear to auscultation  Musculoskeletal: Normal range of motion.  Neurological: She is alert.  Skin: Skin is warm and dry.  Psychiatric: She has a normal  mood and affect. Her behavior is normal. Judgment and thought content normal.    Diagnostics: FVC 2.68, FEV1 2.09.  Addicted FVC 2.63, predicted FEV1 2.08.  Spirometry reveals normal ventilatory function.  Assessment and Plan: 1. Non-seasonal allergic rhinitis, unspecified  trigger   2. Moderate persistent asthma without complication   3. Vertigo     Meds ordered this encounter  Medications  . fluticasone furoate-vilanterol (BREO ELLIPTA) 100-25 MCG/INH AEPB    Sig: Inhale 1 puff into the lungs daily.    Dispense:  1 each    Refill:  3    Patient Instructions  Asthma - Continue Breo Ellipta 100- one puff once a day. - Continue montelukast 10 mg once a day at bedtime. - Use albuterol inhaler 2 puffs every 4 hours as needed for shortness of breath, cough, or wheeze   Allergic rhinitis - Continue allergen avoidance measures - Continue Flonase and Atrovent nasal sprays daily as needed. - Consider nasal saline rinses prior ro medicated nasal sprays   Follow up in 6 months or sooner as needed    Return in about 6 months (around 10/26/2017), or if symptoms worsen or fail to improve.    Thank you for the opportunity to care for this patient.  Please do not hesitate to contact me with questions.  Gareth Morgan, FNP [facility]

## 2017-04-25 NOTE — Patient Instructions (Signed)
Asthma - Continue Breo Ellipta 100- one puff once a day. - Continue montelukast 10 mg once a day at bedtime. - Use albuterol inhaler 2 puffs every 4 hours as needed for shortness of breath, cough, or wheeze   Allergic rhinitis - Continue allergen avoidance measures - Continue Flonase and Atrovent nasal sprays daily as needed. - Consider nasal saline rinses prior ro medicated nasal sprays   Follow up in 6 months or sooner as needed

## 2017-04-29 ENCOUNTER — Telehealth: Payer: Self-pay | Admitting: Rheumatology

## 2017-04-29 ENCOUNTER — Other Ambulatory Visit: Payer: Self-pay | Admitting: Physician Assistant

## 2017-04-29 MED ORDER — ONDANSETRON HCL 4 MG PO TABS: 4.0000 mg | ORAL_TABLET | Freq: Four times a day (QID) | ORAL | 5 refills | Status: DC | PRN

## 2017-04-29 NOTE — Telephone Encounter (Signed)
I called the patient to discuss the GI symptoms she is experiencing following her weekly dose of MTX.  We discussed trying Zofran 4 mg q6hr PRN for nausea and vomiting.  Side effects were discussed. Prescription was sent to the pharmacy.   We also discussed switching her to subcutaneous methotrexate if she does not notice any improvement taking Zofran following her weekly dose of MTX.  She is hesitant to switch to the subcutaneous injection at this time.  All questions were addressed.  She was advised to let us know if she continues to have these GI symptoms or experiences side effects of Zofran.     Hazel Sams, PA-C

## 2017-04-29 NOTE — Telephone Encounter (Signed)
Patient states she is on MTX. Patient when she take the MTX it she gets gas, bloating and her belly hurts. Last the entire day that she take the MTX. Patient has tried Copywriter, advertising and that helps and tums does not.

## 2017-04-29 NOTE — Telephone Encounter (Signed)
Patient calling in reference to MTX. Per patient last couple of times she has taken medication she has experienced belly pain that has lasted all day. Per patient she is experiencing a lot of gas that she was not having before. Please call to advise.

## 2017-05-03 ENCOUNTER — Telehealth: Payer: Self-pay

## 2017-05-03 NOTE — Telephone Encounter (Signed)
LM for call back from patient

## 2017-05-03 NOTE — Telephone Encounter (Signed)
-----   Message from Dara Hoyer, FNP sent at 05/02/2017  5:44 PM EDT ----- Can you please let this patient know that I have looked back into her paper chart and I can not find an ear drop that was prescribed for eczema. However, I have talked with Dr. Ernst Bowler who has suggested Eucrasia ointment for the ear. Can you please call her and ask her if she is interested in this and place an order if she wants this. Thank you

## 2017-05-06 ENCOUNTER — Telehealth: Payer: Self-pay

## 2017-05-06 NOTE — Telephone Encounter (Signed)
Ambs, Kathrine Cords, FNP  [facility]        Can you please let this patient know that I have looked back into her paper chart and I can not find an ear drop that was prescribed for eczema. However, I have talked with Dr. Ernst Bowler who has suggested Eucrasia ointment for the ear. Can you please call her and ask her if she is interested in this and place an order if she wants this. Thank you     Lm for pt to call us back

## 2017-05-09 ENCOUNTER — Telehealth: Payer: Self-pay

## 2017-05-09 NOTE — Telephone Encounter (Signed)
Pt called in and stated she found the ear drops bottle she was referring too.Name is fluocinolone 1%. Is it ok for [place] to send this in for her?  Please advise

## 2017-05-09 NOTE — Telephone Encounter (Signed)
Opened in error

## 2017-05-09 NOTE — Telephone Encounter (Signed)
Can you please order this as 5 drops in the red itchy ear for 7 days, then stop? Thank you

## 2017-05-10 ENCOUNTER — Other Ambulatory Visit: Payer: Self-pay

## 2017-05-10 MED ORDER — FLUOCINOLONE ACETONIDE 0.01 % OT OIL: TOPICAL_OIL | OTIC | 0 refills | Status: DC

## 2017-05-10 NOTE — Telephone Encounter (Signed)
Sent in rx and lm for pt to call us back about me sending in rx

## 2017-05-29 DIAGNOSIS — R300 Dysuria: Secondary | ICD-10-CM | POA: Diagnosis not present

## 2017-05-29 DIAGNOSIS — M545 Low back pain: Secondary | ICD-10-CM | POA: Diagnosis not present

## 2017-05-29 DIAGNOSIS — K5900 Constipation, unspecified: Secondary | ICD-10-CM | POA: Diagnosis not present

## 2017-05-29 DIAGNOSIS — R109 Unspecified abdominal pain: Secondary | ICD-10-CM | POA: Diagnosis not present

## 2017-05-29 DIAGNOSIS — Z6825 Body mass index (BMI) 25.0-25.9, adult: Secondary | ICD-10-CM | POA: Diagnosis not present

## 2017-06-01 ENCOUNTER — Other Ambulatory Visit: Payer: Self-pay | Admitting: Rheumatology

## 2017-06-03 NOTE — Telephone Encounter (Addendum)
Last Visit: 02/21/17 Next visit: 08/27/17 Labs: 02/21/17 WBC elevated  Left message to advise patient she is due to update labs.   Okay to refill 30 day supply per Dr. Estanislado Pandy

## 2017-06-05 ENCOUNTER — Telehealth: Payer: Self-pay | Admitting: Rheumatology

## 2017-06-05 ENCOUNTER — Other Ambulatory Visit: Payer: Self-pay | Admitting: *Deleted

## 2017-06-05 DIAGNOSIS — Z79899 Other long term (current) drug therapy: Secondary | ICD-10-CM

## 2017-06-05 NOTE — Telephone Encounter (Signed)
Lab orders released.  

## 2017-06-05 NOTE — Telephone Encounter (Signed)
Patient called stating that she is going to [facility] in [place] tomorrow to get her labwork done.  Patient is requesting that her orders be sent to their office.

## 2017-06-06 DIAGNOSIS — Z79899 Other long term (current) drug therapy: Secondary | ICD-10-CM | POA: Diagnosis not present

## 2017-06-06 LAB — CBC WITH DIFFERENTIAL/PLATELET
Basophils Absolute: 83 cells/uL (ref 0–200)
Basophils Relative: 0.9 %
Eosinophils Absolute: 239 cells/uL (ref 15–500)
Eosinophils Relative: 2.6 %
HEMATOCRIT: 36.8 % (ref 35.0–45.0)
HEMOGLOBIN: 12.4 g/dL (ref 11.7–15.5)
Lymphs Abs: 4011 cells/uL — ABNORMAL HIGH (ref 850–3900)
MCH: 30.3 pg (ref 27.0–33.0)
MCHC: 33.7 g/dL (ref 32.0–36.0)
MCV: 90 fL (ref 80.0–100.0)
MPV: 12 fL (ref 7.5–12.5)
Monocytes Relative: 6.2 %
NEUTROS PCT: 46.7 %
Neutro Abs: 4296 cells/uL (ref 1500–7800)
Platelets: 439 10*3/uL — ABNORMAL HIGH (ref 140–400)
RBC: 4.09 10*6/uL (ref 3.80–5.10)
RDW: 13 % (ref 11.0–15.0)
Total Lymphocyte: 43.6 %
WBC: 9.2 10*3/uL (ref 3.8–10.8)
WBCMIX: 570 {cells}/uL (ref 200–950)

## 2017-06-06 LAB — COMPLETE METABOLIC PANEL WITH GFR
AG RATIO: 2 (calc) (ref 1.0–2.5)
ALBUMIN MSPROF: 4.3 g/dL (ref 3.6–5.1)
ALKALINE PHOSPHATASE (APISO): 41 U/L (ref 33–130)
ALT: 21 U/L (ref 6–29)
AST: 18 U/L (ref 10–35)
BUN: 21 mg/dL (ref 7–25)
CHLORIDE: 102 mmol/L (ref 98–110)
CO2: 24 mmol/L (ref 20–32)
CREATININE: 0.93 mg/dL (ref 0.50–0.99)
Calcium: 9.8 mg/dL (ref 8.6–10.4)
GFR, Est African American: 75 mL/min/{1.73_m2} (ref >=60)
GFR, Est Non African American: 65 mL/min/{1.73_m2} (ref >=60)
GLOBULIN: 2.1 g/dL (ref 1.9–3.7)
Glucose, Bld: 248 mg/dL — ABNORMAL HIGH (ref 65–139)
POTASSIUM: 4.8 mmol/L (ref 3.5–5.3)
SODIUM: 136 mmol/L (ref 135–146)
Total Bilirubin: 0.3 mg/dL (ref 0.2–1.2)
Total Protein: 6.4 g/dL (ref 6.1–8.1)

## 2017-06-06 NOTE — Progress Notes (Signed)
Plts are mildly elevated. We will continue to monitor.  Glucose is very elevated. Please notify patient.

## 2017-06-08 ENCOUNTER — Other Ambulatory Visit: Payer: Self-pay | Admitting: Rheumatology

## 2017-06-10 NOTE — Telephone Encounter (Signed)
Last Visit: 02/21/17 Next visit: 08/27/17  Okay to refill per Dr. Estanislado Pandy

## 2017-06-17 DIAGNOSIS — I10 Essential (primary) hypertension: Secondary | ICD-10-CM | POA: Diagnosis not present

## 2017-06-17 DIAGNOSIS — E782 Mixed hyperlipidemia: Secondary | ICD-10-CM | POA: Diagnosis not present

## 2017-06-19 DIAGNOSIS — E1140 Type 2 diabetes mellitus with diabetic neuropathy, unspecified: Secondary | ICD-10-CM | POA: Diagnosis not present

## 2017-06-19 DIAGNOSIS — E282 Polycystic ovarian syndrome: Secondary | ICD-10-CM | POA: Diagnosis not present

## 2017-06-19 DIAGNOSIS — J302 Other seasonal allergic rhinitis: Secondary | ICD-10-CM | POA: Diagnosis not present

## 2017-06-19 DIAGNOSIS — K219 Gastro-esophageal reflux disease without esophagitis: Secondary | ICD-10-CM | POA: Diagnosis not present

## 2017-06-19 DIAGNOSIS — Z6825 Body mass index (BMI) 25.0-25.9, adult: Secondary | ICD-10-CM | POA: Diagnosis not present

## 2017-06-24 DIAGNOSIS — R109 Unspecified abdominal pain: Secondary | ICD-10-CM | POA: Diagnosis not present

## 2017-06-24 DIAGNOSIS — R300 Dysuria: Secondary | ICD-10-CM | POA: Diagnosis not present

## 2017-06-24 DIAGNOSIS — K219 Gastro-esophageal reflux disease without esophagitis: Secondary | ICD-10-CM | POA: Diagnosis not present

## 2017-06-24 DIAGNOSIS — N390 Urinary tract infection, site not specified: Secondary | ICD-10-CM | POA: Diagnosis not present

## 2017-06-24 DIAGNOSIS — K5900 Constipation, unspecified: Secondary | ICD-10-CM | POA: Diagnosis not present

## 2017-06-24 DIAGNOSIS — L239 Allergic contact dermatitis, unspecified cause: Secondary | ICD-10-CM | POA: Diagnosis not present

## 2017-06-24 DIAGNOSIS — M545 Low back pain: Secondary | ICD-10-CM | POA: Diagnosis not present

## 2017-06-24 DIAGNOSIS — E782 Mixed hyperlipidemia: Secondary | ICD-10-CM | POA: Diagnosis not present

## 2017-06-24 DIAGNOSIS — E119 Type 2 diabetes mellitus without complications: Secondary | ICD-10-CM | POA: Diagnosis not present

## 2017-06-24 DIAGNOSIS — Z6825 Body mass index (BMI) 25.0-25.9, adult: Secondary | ICD-10-CM | POA: Diagnosis not present

## 2017-06-24 DIAGNOSIS — J309 Allergic rhinitis, unspecified: Secondary | ICD-10-CM | POA: Diagnosis not present

## 2017-06-24 DIAGNOSIS — Z6826 Body mass index (BMI) 26.0-26.9, adult: Secondary | ICD-10-CM | POA: Diagnosis not present

## 2017-07-03 DIAGNOSIS — E782 Mixed hyperlipidemia: Secondary | ICD-10-CM | POA: Diagnosis not present

## 2017-07-03 DIAGNOSIS — E119 Type 2 diabetes mellitus without complications: Secondary | ICD-10-CM | POA: Diagnosis not present

## 2017-07-03 DIAGNOSIS — B373 Candidiasis of vulva and vagina: Secondary | ICD-10-CM | POA: Diagnosis not present

## 2017-07-03 DIAGNOSIS — K5900 Constipation, unspecified: Secondary | ICD-10-CM | POA: Diagnosis not present

## 2017-07-03 DIAGNOSIS — K219 Gastro-esophageal reflux disease without esophagitis: Secondary | ICD-10-CM | POA: Diagnosis not present

## 2017-07-03 DIAGNOSIS — N390 Urinary tract infection, site not specified: Secondary | ICD-10-CM | POA: Diagnosis not present

## 2017-07-03 DIAGNOSIS — R109 Unspecified abdominal pain: Secondary | ICD-10-CM | POA: Diagnosis not present

## 2017-07-03 DIAGNOSIS — M545 Low back pain: Secondary | ICD-10-CM | POA: Diagnosis not present

## 2017-07-03 DIAGNOSIS — Z6825 Body mass index (BMI) 25.0-25.9, adult: Secondary | ICD-10-CM | POA: Diagnosis not present

## 2017-07-03 DIAGNOSIS — E1140 Type 2 diabetes mellitus with diabetic neuropathy, unspecified: Secondary | ICD-10-CM | POA: Diagnosis not present

## 2017-07-03 DIAGNOSIS — J309 Allergic rhinitis, unspecified: Secondary | ICD-10-CM | POA: Diagnosis not present

## 2017-07-03 DIAGNOSIS — R300 Dysuria: Secondary | ICD-10-CM | POA: Diagnosis not present

## 2017-07-15 ENCOUNTER — Encounter: Payer: Self-pay | Admitting: Diagnostic Neuroimaging

## 2017-07-15 ENCOUNTER — Ambulatory Visit (INDEPENDENT_AMBULATORY_CARE_PROVIDER_SITE_OTHER): Payer: PPO | Admitting: Diagnostic Neuroimaging

## 2017-07-15 VITALS — BP 127/80 | HR 92 | Ht 60.0 in | Wt 128.6 lb

## 2017-07-15 DIAGNOSIS — G43009 Migraine without aura, not intractable, without status migrainosus: Secondary | ICD-10-CM

## 2017-07-15 DIAGNOSIS — G4733 Obstructive sleep apnea (adult) (pediatric): Secondary | ICD-10-CM

## 2017-07-15 NOTE — Progress Notes (Signed)
[facility]  PATIENT: Stephanie Norris DOB: 1955-10-09  REFERRING CLINICIAN: Merlyn Albert, MD  HISTORY FROM: patient and chart review REASON FOR VISIT: follow up   HISTORICAL  CHIEF COMPLAINT:  Chief Complaint  Patient presents with  . Migraine    rm 7, husband- Marcello Moores, "doing well, vertigo is almost completely gone, only taken a few rizatriptan and they worked"   . Follow-up    6 month    HISTORY OF PRESENT ILLNESS:   UPDATE (07/15/17, VRP): Since last visit, doing well. Tolerating meds. HA are improved. Tolerating rizatriptan as needed. No alleviating or aggravating factors.   PRIOR HPI (01/09/17): 62 year old female with rheumatoid arthritis, osteoporosis, diabetes, hypercholesterolemia, depression, fibromyalgia, here for evaluation of headaches and dizziness.  The past 1 year patient has had onset of headache pressure of blood rushing sensation in head, 1-2 times per week, associated with photophobia, phonophobia, dizziness, lasting up to 1 day at a time.  Sometimes she has a weird feeling on the top of her head which moves around.  Sometimes she has spinning sensation.  She has used Excedrin Migraine with mild relief.  Her sister also has migraines.  Patient never had similar symptoms in the past.   REVIEW OF SYSTEMS: Full 14 system review of systems performed and negative with exception of: joint pain neck pain wheezing runny nose.    ALLERGIES: Allergies  Allergen Reactions  . Nsaids Anaphylaxis  . Red Dye Rash    Itching Itching  . Bactrim [Sulfamethoxazole-Trimethoprim]     Upset stomach  . Cephalexin     rash  . Levofloxacin     rash  . Nitrofurantoin Monohyd Macro     rash    HOME MEDICATIONS: Outpatient Medications Prior to Visit  Medication Sig Dispense Refill  . albuterol (PROVENTIL HFA;VENTOLIN HFA) 108 (90 Base) MCG/ACT inhaler Inhale 2 puffs into the lungs every 4 (four) hours as needed for wheezing or shortness of breath.    .  Alpha-Lipoic Acid 200 MG CAPS Take 1 capsule by mouth at bedtime.     . Continuous Blood Gluc Sensor ([place]) MISC as needed.  3  . dapagliflozin propanediol (FARXIGA) 10 MG TABS tablet Take 10 mg by mouth daily.    . diclofenac sodium (VOLTAREN) 1 % GEL Apply 3 grams to three large joints up to three times daily as needed 3 Tube 3  . DULoxetine (CYMBALTA) 60 MG capsule TAKE 1 CAPSULE BY MOUTH EVERY DAY 90 capsule 0  . fluticasone (FLONASE) 50 MCG/ACT nasal spray Place 1 spray into both nostrils daily.     . fluticasone furoate-vilanterol (BREO ELLIPTA) 100-25 MCG/INH AEPB Inhale 1 puff into the lungs daily. 1 each 3  . folic acid (FOLVITE) 1 MG tablet Take 2 tablets (2 mg total) by mouth daily. 180 tablet 4  . ipratropium (ATROVENT) 0.06 % nasal spray USE 2 SPRAYS IN EACH NOSTRIL THREE TIMES DAILY 45 mL 0  . levocetirizine (XYZAL) 5 MG tablet Take 5 mg by mouth every evening.    . metFORMIN (GLUCOPHAGE-XR) 500 MG 24 hr tablet Take 1,000 mg by mouth 2 (two) times daily.     . methotrexate (RHEUMATREX) 2.5 MG tablet TAKE 6 TABLETS BY MOUTH 1 TIME EVERY WEEK 24 tablet 0  . montelukast (SINGULAIR) 10 MG tablet Take 1 tablet (10 mg total) by mouth at bedtime. 30 tablet 2  . Multiple Vitamins-Minerals (MULTIVITAMIN PO) Take by mouth.    . Omega-3 Fatty Acids (FISH  OIL) 1000 MG CAPS Take 1 capsule by mouth daily.    . ondansetron (ZOFRAN) 4 MG tablet Take 1 tablet (4 mg total) by mouth every 6 (six) hours as needed for nausea or vomiting. 30 tablet 5  . ONETOUCH VERIO test strip     . pantoprazole (PROTONIX) 40 MG tablet Take 1 tablet by mouth 2 (two) times daily.   5  . rizatriptan (MAXALT-MLT) 10 MG disintegrating tablet Take 1 tablet (10 mg total) by mouth as needed for migraine. May repeat in 2 hours if needed 9 tablet 11  . Fluocinolone Acetonide 0.01 % OIL 5 drops to red itchy ear for 7 days, then stop 20 mL 0  . triamcinolone cream (KENALOG) 0.1 % APPLY ON THE SKIN BID  0     No facility-administered medications prior to visit.     PAST MEDICAL HISTORY: Past Medical History:  Diagnosis Date  . Asthma   . COPD (chronic obstructive pulmonary disease) ([place])   . Diabetes mellitus without complication (HCC)    Type 2  . Dizziness   . Fibromyalgia   . GERD (gastroesophageal reflux disease)   . Headache   . Hyperlipidemia   . OSA (obstructive sleep apnea)   . Osteoarthritis   . PCOS (polycystic ovarian syndrome)   . Rheumatoid arthritis ([place])     PAST SURGICAL HISTORY: Past Surgical History:  Procedure Laterality Date  . APPENDECTOMY    . CHOLECYSTECTOMY    . COLONOSCOPY W/ BIOPSIES  fall 2009  . COMBINED HYSTERECTOMY VAGINAL / OOPHORECTOMY / A&P REPAIR  10/2002   endometriosis, cystocele, fibroids  . KNEE SURGERY  1996  . VESICOVAGINAL FISTULA CLOSURE W/ TAH      FAMILY HISTORY: Family History  Problem Relation Age of Onset  . Asthma Mother   . Rheum arthritis Mother   . Allergic rhinitis Mother   . Rheum arthritis Sister   . Allergic rhinitis Sister   . Heart failure Father   . Heart disease Father   . Rheum arthritis Sister   . Allergic rhinitis Sister   . Allergic rhinitis Brother   . Allergies Unknown        "Everyone"  . Heart disease Maternal Grandfather   . Heart disease Maternal Grandmother   . Heart disease Paternal Grandfather   . Heart disease Paternal Grandmother     SOCIAL HISTORY:  Social History   Socioeconomic History  . Marital status: Married    Spouse name: Not on file  . Number of children: 0  . Years of education: Not on file  . Highest education level: Not on file  Occupational History  . Occupation: Patient Account Rep    Employer: [employer]  . Financial resource strain: Not on file  . Food insecurity:    Worry: Not on file    Inability: Not on file  . Transportation needs:    Medical: Not on file    Non-medical: Not on file  Tobacco Use  . Smoking status: Never Smoker   . Smokeless tobacco: Never Used  Substance and Sexual Activity  . Alcohol use: No  . Drug use: No  . Sexual activity: Not on file  Lifestyle  . Physical activity:    Days per week: Not on file    Minutes per session: Not on file  . Stress: Not on file  Relationships  . Social connections:    Talks on phone: Not on file    Gets  together: Not on file    Attends religious service: Not on file    Active member of club or organization: Not on file    Attends meetings of clubs or organizations: Not on file    Relationship status: Not on file  . Intimate partner violence:    Fear of current or ex partner: Not on file    Emotionally abused: Not on file    Physically abused: Not on file    Forced sexual activity: Not on file  Other Topics Concern  . Not on file  Social History Narrative   Lives with husband   Retired    no children   Assoc degree   16 oz caffeine daily     PHYSICAL EXAM  GENERAL EXAM/CONSTITUTIONAL: Vitals:  Vitals:   07/15/17 1600  BP: 127/80  Pulse: 92  Weight: 128 lb 9.6 oz (58.3 kg)   Body mass index is 25.54 kg/m. No exam data present  Patient is in no distress; well developed, nourished and groomed; neck is supple  CARDIOVASCULAR:  Examination of carotid arteries is normal; no carotid bruits  Regular rate and rhythm, no murmurs  Examination of peripheral vascular system by observation and palpation is normal  EYES:  Ophthalmoscopic exam of optic discs and posterior segments is normal; no papilledema or hemorrhages  MUSCULOSKELETAL:  Gait, strength, tone, movements noted in Neurologic exam below  NEUROLOGIC: MENTAL STATUS:  No flowsheet data found.  awake, alert, oriented to person, place and time  recent and remote memory intact  normal attention and concentration  language fluent, comprehension intact, naming intact,   fund of knowledge appropriate  CRANIAL NERVE:   2nd - no papilledema on fundoscopic exam  2nd, 3rd,  4th, 6th - pupils equal and reactive to light, visual fields full to confrontation, extraocular muscles intact, no nystagmus  5th - facial sensation symmetric  7th - facial strength symmetric  8th - hearing intact  9th - palate elevates symmetrically, uvula midline  11th - shoulder shrug symmetric  12th - tongue protrusion midline  MOTOR:   normal bulk and tone, full strength in the BUE, BLE  SENSORY:   normal and symmetric to light touch, temperature, vibration  COORDINATION:   finger-nose-finger, fine finger movements normal  REFLEXES:   deep tendon reflexes present and symmetric  GAIT/STATION:   narrow based gait    DIAGNOSTIC DATA (LABS, IMAGING, TESTING) - I reviewed patient records, labs, notes, testing and imaging myself where available.  Lab Results  Component Value Date   WBC 9.2 06/06/2017   HGB 12.4 06/06/2017   HCT 36.8 06/06/2017   MCV 90.0 06/06/2017   PLT 439 (H) 06/06/2017      Component Value Date/Time   NA 136 06/06/2017 0948   K 4.8 06/06/2017 0948   CL 102 06/06/2017 0948   CO2 24 06/06/2017 0948   GLUCOSE 248 (H) 06/06/2017 0948   BUN 21 06/06/2017 0948   CREATININE 0.93 06/06/2017 0948   CALCIUM 9.8 06/06/2017 0948   PROT 6.4 06/06/2017 0948   ALBUMIN 4.0 09/10/2016 0916   AST 18 06/06/2017 0948   ALT 21 06/06/2017 0948   ALKPHOS 54 09/10/2016 0916   BILITOT 0.3 06/06/2017 0948   GFRNONAA 65 06/06/2017 0948   GFRAA 75 06/06/2017 0948   No results found for: CHOL, HDL, LDLCALC, LDLDIRECT, TRIG, CHOLHDL Lab Results  Component Value Date   HGBA1C 6.7 (H) 03/02/2016   No results found for: [id number] Lab Results  Component Value  Date   TSH 2.05 02/21/2017     03/10/10 MRI brain [I reviewed images myself and agree with interpretation. -VRP]  - Normal examination.  No acute or traumatic finding.  10/03/16 MRA head [I reviewed images myself and agree with interpretation. -VRP]  1. Patent circle of Willis. No large vessel  occlusion, aneurysm, or significant stenosis is identified. 2. Mild atherosclerotic irregularity of distal cavernous ICA segments with mild stenosis. 3. No evidence for intracranial vertebrobasilar insufficiency.  01/25/18 MRI brain and IAC [I reviewed images myself and agree with interpretation. -VRP]  - No cause of headache or tinnitus identified. Mild chronic small-vessel ischemic change of the hemispheric white matter, progressive since 2012.     ASSESSMENT AND PLAN  62 y.o. year old female here with new onset headaches and dizziness for past 1 year, with migraine features.  Doing well now.   Dx:  1. Migraine without aura and without status migrainosus, not intractable   2. OSA (obstructive sleep apnea)      PLAN:  HEADACHES / DIZZINESS (improved; migraine) - rizatriptan as needed - consider propranolol for migraine prevention (could not tolerated topiramate in the past)  SLEEP APNEA - restart CPAP; follow up with Dr. Halford Chessman  INSOMNIA / ANXIETY / CAREGIVER STRESS (improved) - consider psychiatry / psychology eval in future if needed  BLURRED VISION - follow up with eye doctor (glasses)  Return if symptoms worsen or fail to improve, for return to PCP.    Penni Bombard, MD 5/59/7416, 3:84 PM Certified in Neurology, Neurophysiology and Neuroimaging  [facility] [address] [place], [place] [address] [phone]

## 2017-07-19 ENCOUNTER — Other Ambulatory Visit: Payer: Self-pay | Admitting: Rheumatology

## 2017-07-19 NOTE — Telephone Encounter (Signed)
Last Visit: 02/21/17 Next visit: 08/27/17 Labs: 06/06/17 Plts are mildly elevated We will continue to monitor. Glucose is very elevated  Okay to refill per Dr. Estanislado Pandy

## 2017-07-26 DIAGNOSIS — J45901 Unspecified asthma with (acute) exacerbation: Secondary | ICD-10-CM | POA: Diagnosis not present

## 2017-08-01 NOTE — Progress Notes (Signed)
Office Visit Note  Patient: Stephanie Norris             Date of Birth: 1955-05-02           MRN: [id number]             PCP: Celene Squibb, MD Referring: Celene Squibb, MD Visit Date: 08/13/2017 Occupation: _0 @    Subjective:  Pain in hands  History of Present Illness: Stephanie Norris is a 62 y.o. female with history of seropositive rheumatoid arthritis, osteoarthritis, fibromyalgia, and DDD.  She has been having pain and discomfort in her left thumb and left index finger.  She reports having a knot on her first DIP joint on the left side.  He denies any joint swelling.  She continues to have some discomfort in her cervical spine.  Her lumbar spine pain is tolerable.  Her myalgia is well controlled.  Activities of Daily Living:  Patient reports morning stiffness for 0 minutes.   Patient Reports nocturnal pain.  Difficulty dressing/grooming: Denies Difficulty climbing stairs: Reports Difficulty getting out of chair: Denies Difficulty using hands for taps, buttons, cutlery, and/or writing: Reports   Review of Systems  Constitutional: Negative for fatigue, night sweats, weight gain and weight loss.  HENT: Positive for mouth dryness. Negative for mouth sores, trouble swallowing, trouble swallowing and nose dryness.   Eyes: Positive for dryness. Negative for pain, redness and visual disturbance.  Respiratory: Negative for cough, hemoptysis, shortness of breath and difficulty breathing.   Cardiovascular: Negative for chest pain, palpitations, hypertension, irregular heartbeat and swelling in legs/feet.  Gastrointestinal: Positive for constipation. Negative for abdominal pain, blood in stool and diarrhea.  Endocrine: Negative for increased urination.  Genitourinary: Negative for painful urination, pelvic pain and vaginal dryness.  Musculoskeletal: Positive for arthralgias, joint pain, myalgias and myalgias. Negative for joint swelling, muscle weakness, morning stiffness and muscle  tenderness.  Skin: Negative for color change, pallor, rash, hair loss, nodules/bumps, skin tightness, ulcers and sensitivity to sunlight.  Allergic/Immunologic: Negative for susceptible to infections.  Neurological: Positive for memory loss. Negative for dizziness, light-headedness, numbness, headaches, night sweats and weakness.  Hematological: Negative for bruising/bleeding tendency and swollen glands.  Psychiatric/Behavioral: Negative for depressed mood, confusion and sleep disturbance. The patient is not nervous/anxious.     PMFS History:  Patient Active Problem List   Diagnosis Date Noted  . Vertigo 08/20/2016  . Eustachian tube dysfunction 04/17/2016  . Abdominal pain 03/02/2016  . Gastroenteritis 03/02/2016  . Rheumatoid arthritis with rheumatoid factor of multiple sites without organ or systems involvement ([place]) 01/13/2016  . HLA B27 (HLA B27 positive) 01/13/2016  . Osteoarthritis of foot 01/13/2016  . DJD (degenerative joint disease), cervical 01/13/2016  . Spondylosis of lumbar region without myelopathy or radiculopathy 01/13/2016  . Primary osteoarthritis of both hands 01/13/2016  . Non-seasonal allergic rhinitis 08/13/2014  . Stiffness of joints, not elsewhere classified, multiple sites 08/26/2013  . Flu-like symptoms 03/02/2013  . Lactic acidosis 03/02/2013  . Sinus tachycardia 03/02/2013  . Elevated lactic acid level 03/02/2013  . Influenza due to identified novel influenza A virus with other respiratory manifestations 03/02/2013  . Hyperglycemia 03/02/2013  . Fibromyalgia   . OSA (obstructive sleep apnea) 06/22/2011  . DOE (dyspnea on exertion) 05/30/2011  . Moderate persistent asthma 03/30/2011    Past Medical History:  Diagnosis Date  . Asthma   . COPD (chronic obstructive pulmonary disease) ([place])   . Diabetes mellitus without complication (HCC)    Type 2  .  Dizziness   . Fibromyalgia   . GERD (gastroesophageal reflux disease)   . Headache   .  Hyperlipidemia   . OSA (obstructive sleep apnea)   . Osteoarthritis   . PCOS (polycystic ovarian syndrome)   . Rheumatoid arthritis ([place])     Family History  Problem Relation Age of Onset  . Asthma Mother   . Rheum arthritis Mother   . Allergic rhinitis Mother   . Rheum arthritis Sister   . Allergic rhinitis Sister   . Heart failure Father   . Heart disease Father   . Rheum arthritis Sister   . Allergic rhinitis Sister   . Allergic rhinitis Brother   . Allergies Unknown        "Everyone"  . Heart disease Maternal Grandfather   . Heart disease Maternal Grandmother   . Heart disease Paternal Grandfather   . Heart disease Paternal Grandmother    Past Surgical History:  Procedure Laterality Date  . APPENDECTOMY    . CHOLECYSTECTOMY    . COLONOSCOPY W/ BIOPSIES  fall 2009  . COMBINED HYSTERECTOMY VAGINAL / OOPHORECTOMY / A&P REPAIR  10/2002   endometriosis, cystocele, fibroids  . KNEE SURGERY  1996  . VESICOVAGINAL FISTULA CLOSURE W/ TAH     Social History   Social History Narrative   Lives with husband   Retired    no children   Assoc degree   16 oz caffeine daily     Objective: Vital Signs: BP 130/79 (BP Location: Left Arm, Patient Position: Sitting, Cuff Size: Normal)   Pulse 83   Resp 12   Ht 4' 11.5" (1.511 m)   Wt 127 lb 8 oz (57.8 kg)   BMI 25.32 kg/m    Physical Exam  Constitutional: She is oriented to person, place, and time. She appears well-developed and well-nourished.  HENT:  Head: Normocephalic and atraumatic.  Eyes: Conjunctivae and EOM are normal.  Neck: Normal range of motion.  Cardiovascular: Normal rate, regular rhythm, normal heart sounds and intact distal pulses.  Pulmonary/Chest: Effort normal and breath sounds normal.  Abdominal: Soft. Bowel sounds are normal.  Lymphadenopathy:    She has no cervical adenopathy.  Neurological: She is alert and oriented to person, place, and time.  Skin: Skin is warm and dry. Capillary refill takes  less than 2 seconds.  Psychiatric: She has a normal mood and affect. Her behavior is normal.  Nursing note and vitals reviewed.    Musculoskeletal Exam: C-spine thoracic lumbar spine limited range of motion.  Shoulder joints elbow joints wrist joints were in good range of motion with no synovitis.  No synovitis was noted over MCPs PIPs or DIPs.  She had left thumb flexor tendon thickening.  She has DIP thickening in bilateral hands with a small mucinous cyst over the left hand first DIP.  Hip joints knee joints ankles MTPs PIPs were in good range of motion with no synovitis.  CDAI Exam: CDAI Homunculus Exam:   Joint Counts:  CDAI Tender Joint count: 0 CDAI Swollen Joint count: 0  Global Assessments:  Patient Global Assessment: 3 Provider Global Assessment: 1  CDAI Calculated Score: 4    Investigation: No additional findings. CBC Latest Ref Rng & Units 06/06/2017 02/21/2017 09/10/2016  WBC 3.8 - 10.8 Thousand/uL 9.2 13.4(H) 10.2  Hemoglobin 11.7 - 15.5 g/dL 12.4 13.0 12.7  Hematocrit 35.0 - 45.0 % 36.8 37.3 37.8  Platelets 140 - 400 Thousand/uL 439(H) 376 388   CMP Latest Ref Rng & Units  06/06/2017 02/21/2017 01/25/2017  Glucose 65 - 139 mg/dL 248(H) 118(H) -  BUN 7 - 25 mg/dL 21 19 -  Creatinine 0.50 - 0.99 mg/dL 0.93 0.70 0.70  Sodium 135 - 146 mmol/L 136 136 -  Potassium 3.5 - 5.3 mmol/L 4.8 4.1 -  Chloride 98 - 110 mmol/L 102 101 -  CO2 20 - 32 mmol/L 24 21 -  Calcium 8.6 - 10.4 mg/dL 9.8 10.1 -  Total Protein 6.1 - 8.1 g/dL 6.4 6.9 -  Total Bilirubin 0.2 - 1.2 mg/dL 0.3 0.3 -  Alkaline Phos 33 - 130 U/L - - -  AST 10 - 35 U/L 18 16 -  ALT 6 - 29 U/L 21 21 -    Imaging: No results found.  Speciality Comments: No specialty comments available.    Procedures:  No procedures performed Allergies: Nsaids; Red dye; Bactrim [sulfamethoxazole-trimethoprim]; Cephalexin; Levofloxacin; and Nitrofurantoin monohyd macro   Assessment / Plan:     Visit Diagnoses: Rheumatoid  arthritis with rheumatoid factor of multiple sites without organ or systems involvement (HCC) - + anti-CCP.  Patient had no synovitis on examination today.  Her rheumatoid arthritis seems to be quite well controlled.  HLA B27 (HLA B27 positive)-she had no clinical features of spondyloarthropathy.  High risk medication use - Methotrexate 6 tablets by mouth every week, folic acid 1 mg by mouth daily .  Her labs have been stable.  We will continue to monitor labs every 3 months.  Primary osteoarthritis of both hands-she has been experiencing increased pain in her hands.  She has DIP thickening and a small mucinous cyst over her left first DIP which we will observe for right now.  She also has intermittent left thumb triggering.  I have advised her to massage diclofenac gel if it persists then we can inject with cortisone in future.  Primary osteoarthritis of both feet-proper fitting shoes were discussed.  DDD (degenerative disc disease), cervical-chronic pain  DDD (degenerative disc disease), lumbar-she is currently not having much discomfort.  Fibromyalgia-her symptoms are fairly well controlled.  Other fatigue-improved.  Patient has some thoracic kyphosis.  I do not have a recent bone density to review.  Have advised her to schedule a bone density.  Patient states she has appointment with PCP this afternoon and she will schedule DEXA through their office.  History of sleep apnea  History of depression  History of asthma  History of diabetes mellitus    Orders: No orders of the defined types were placed in this encounter.  No orders of the defined types were placed in this encounter.   Face-to-face time spent with patient was 30 minutes. Greater than 50% of time was spent in counseling and coordination of care.  Follow-Up Instructions: Return in about 5 months (around 01/13/2018) for Rheumatoid arthritis, Osteoarthritis, DDD, Fibromyalgia.   Bo Merino, MD  Note - This  record has been created using Editor, commissioning.  Chart creation errors have been sought, but may not always  have been located. Such creation errors do not reflect on  the standard of medical care.

## 2017-08-05 DIAGNOSIS — L239 Allergic contact dermatitis, unspecified cause: Secondary | ICD-10-CM | POA: Diagnosis not present

## 2017-08-05 DIAGNOSIS — J45909 Unspecified asthma, uncomplicated: Secondary | ICD-10-CM | POA: Diagnosis not present

## 2017-08-05 DIAGNOSIS — E782 Mixed hyperlipidemia: Secondary | ICD-10-CM | POA: Diagnosis not present

## 2017-08-05 DIAGNOSIS — K219 Gastro-esophageal reflux disease without esophagitis: Secondary | ICD-10-CM | POA: Diagnosis not present

## 2017-08-05 DIAGNOSIS — M545 Low back pain: Secondary | ICD-10-CM | POA: Diagnosis not present

## 2017-08-05 DIAGNOSIS — Z6825 Body mass index (BMI) 25.0-25.9, adult: Secondary | ICD-10-CM | POA: Diagnosis not present

## 2017-08-05 DIAGNOSIS — E119 Type 2 diabetes mellitus without complications: Secondary | ICD-10-CM | POA: Diagnosis not present

## 2017-08-05 DIAGNOSIS — R300 Dysuria: Secondary | ICD-10-CM | POA: Diagnosis not present

## 2017-08-05 DIAGNOSIS — J309 Allergic rhinitis, unspecified: Secondary | ICD-10-CM | POA: Diagnosis not present

## 2017-08-05 DIAGNOSIS — K5900 Constipation, unspecified: Secondary | ICD-10-CM | POA: Diagnosis not present

## 2017-08-05 DIAGNOSIS — N390 Urinary tract infection, site not specified: Secondary | ICD-10-CM | POA: Diagnosis not present

## 2017-08-05 DIAGNOSIS — R109 Unspecified abdominal pain: Secondary | ICD-10-CM | POA: Diagnosis not present

## 2017-08-13 ENCOUNTER — Encounter: Payer: Self-pay | Admitting: Rheumatology

## 2017-08-13 ENCOUNTER — Ambulatory Visit: Payer: PPO | Admitting: Rheumatology

## 2017-08-13 VITALS — BP 130/79 | HR 83 | Resp 12 | Ht 59.5 in | Wt 127.5 lb

## 2017-08-13 DIAGNOSIS — M5136 Other intervertebral disc degeneration, lumbar region: Secondary | ICD-10-CM

## 2017-08-13 DIAGNOSIS — N390 Urinary tract infection, site not specified: Secondary | ICD-10-CM | POA: Diagnosis not present

## 2017-08-13 DIAGNOSIS — Z79899 Other long term (current) drug therapy: Secondary | ICD-10-CM | POA: Diagnosis not present

## 2017-08-13 DIAGNOSIS — Z1589 Genetic susceptibility to other disease: Secondary | ICD-10-CM

## 2017-08-13 DIAGNOSIS — Z8659 Personal history of other mental and behavioral disorders: Secondary | ICD-10-CM

## 2017-08-13 DIAGNOSIS — J45909 Unspecified asthma, uncomplicated: Secondary | ICD-10-CM | POA: Diagnosis not present

## 2017-08-13 DIAGNOSIS — M19042 Primary osteoarthritis, left hand: Secondary | ICD-10-CM

## 2017-08-13 DIAGNOSIS — Z8639 Personal history of other endocrine, nutritional and metabolic disease: Secondary | ICD-10-CM

## 2017-08-13 DIAGNOSIS — M19072 Primary osteoarthritis, left ankle and foot: Secondary | ICD-10-CM

## 2017-08-13 DIAGNOSIS — E1140 Type 2 diabetes mellitus with diabetic neuropathy, unspecified: Secondary | ICD-10-CM | POA: Diagnosis not present

## 2017-08-13 DIAGNOSIS — Z8669 Personal history of other diseases of the nervous system and sense organs: Secondary | ICD-10-CM

## 2017-08-13 DIAGNOSIS — E781 Pure hyperglyceridemia: Secondary | ICD-10-CM | POA: Diagnosis not present

## 2017-08-13 DIAGNOSIS — M797 Fibromyalgia: Secondary | ICD-10-CM | POA: Diagnosis not present

## 2017-08-13 DIAGNOSIS — M19071 Primary osteoarthritis, right ankle and foot: Secondary | ICD-10-CM

## 2017-08-13 DIAGNOSIS — M19041 Primary osteoarthritis, right hand: Secondary | ICD-10-CM

## 2017-08-13 DIAGNOSIS — E119 Type 2 diabetes mellitus without complications: Secondary | ICD-10-CM | POA: Diagnosis not present

## 2017-08-13 DIAGNOSIS — L239 Allergic contact dermatitis, unspecified cause: Secondary | ICD-10-CM | POA: Diagnosis not present

## 2017-08-13 DIAGNOSIS — M0579 Rheumatoid arthritis with rheumatoid factor of multiple sites without organ or systems involvement: Secondary | ICD-10-CM | POA: Diagnosis not present

## 2017-08-13 DIAGNOSIS — M5030 Other cervical disc degeneration, unspecified cervical region: Secondary | ICD-10-CM

## 2017-08-13 DIAGNOSIS — R5383 Other fatigue: Secondary | ICD-10-CM

## 2017-08-13 DIAGNOSIS — J45901 Unspecified asthma with (acute) exacerbation: Secondary | ICD-10-CM | POA: Diagnosis not present

## 2017-08-13 DIAGNOSIS — Z8709 Personal history of other diseases of the respiratory system: Secondary | ICD-10-CM

## 2017-08-13 DIAGNOSIS — R300 Dysuria: Secondary | ICD-10-CM | POA: Diagnosis not present

## 2017-08-13 DIAGNOSIS — K219 Gastro-esophageal reflux disease without esophagitis: Secondary | ICD-10-CM | POA: Diagnosis not present

## 2017-08-13 DIAGNOSIS — E782 Mixed hyperlipidemia: Secondary | ICD-10-CM | POA: Diagnosis not present

## 2017-08-13 NOTE — Patient Instructions (Signed)
Standing Labs We placed an order today for your standing lab work.    Please come back and get your standing labs in August and every 3 months   We have open lab Monday through Friday from 8:30-11:30 AM and 1:30-4:00 PM  at the office of Dr. Eldra Word.   You may experience shorter wait times on Monday and Friday afternoons. The office is located at [address], [address], [place], [place] [address] No appointment is necessary.   Labs are drawn by [facility].  You may receive a bill from [facility] for your lab work. If you have any questions regarding directions or hours of operation,  please call [phone].    

## 2017-08-20 DIAGNOSIS — Z6824 Body mass index (BMI) 24.0-24.9, adult: Secondary | ICD-10-CM | POA: Diagnosis not present

## 2017-08-20 DIAGNOSIS — R300 Dysuria: Secondary | ICD-10-CM | POA: Diagnosis not present

## 2017-08-20 DIAGNOSIS — E119 Type 2 diabetes mellitus without complications: Secondary | ICD-10-CM | POA: Diagnosis not present

## 2017-08-20 DIAGNOSIS — K5900 Constipation, unspecified: Secondary | ICD-10-CM | POA: Diagnosis not present

## 2017-08-20 DIAGNOSIS — Z6825 Body mass index (BMI) 25.0-25.9, adult: Secondary | ICD-10-CM | POA: Diagnosis not present

## 2017-08-20 DIAGNOSIS — E1140 Type 2 diabetes mellitus with diabetic neuropathy, unspecified: Secondary | ICD-10-CM | POA: Diagnosis not present

## 2017-08-20 DIAGNOSIS — E782 Mixed hyperlipidemia: Secondary | ICD-10-CM | POA: Diagnosis not present

## 2017-08-20 DIAGNOSIS — J309 Allergic rhinitis, unspecified: Secondary | ICD-10-CM | POA: Diagnosis not present

## 2017-08-20 DIAGNOSIS — L239 Allergic contact dermatitis, unspecified cause: Secondary | ICD-10-CM | POA: Diagnosis not present

## 2017-08-20 DIAGNOSIS — R109 Unspecified abdominal pain: Secondary | ICD-10-CM | POA: Diagnosis not present

## 2017-08-20 DIAGNOSIS — M545 Low back pain: Secondary | ICD-10-CM | POA: Diagnosis not present

## 2017-08-20 DIAGNOSIS — K219 Gastro-esophageal reflux disease without esophagitis: Secondary | ICD-10-CM | POA: Diagnosis not present

## 2017-08-27 ENCOUNTER — Ambulatory Visit: Payer: PPO | Admitting: Rheumatology

## 2017-09-06 ENCOUNTER — Other Ambulatory Visit: Payer: Self-pay | Admitting: Rheumatology

## 2017-09-06 NOTE — Telephone Encounter (Signed)
Last visit: 08/13/2017  Next visit: 01/13/2018 Labs: 06/06/2017 Plts are mildly elevated. We will continue to monitor.   Okay to refill per Dr. Estanislado Pandy.

## 2017-09-18 DIAGNOSIS — E782 Mixed hyperlipidemia: Secondary | ICD-10-CM | POA: Diagnosis not present

## 2017-09-18 DIAGNOSIS — E1140 Type 2 diabetes mellitus with diabetic neuropathy, unspecified: Secondary | ICD-10-CM | POA: Diagnosis not present

## 2017-09-18 DIAGNOSIS — J45909 Unspecified asthma, uncomplicated: Secondary | ICD-10-CM | POA: Diagnosis not present

## 2017-09-18 DIAGNOSIS — G4700 Insomnia, unspecified: Secondary | ICD-10-CM | POA: Diagnosis not present

## 2017-09-18 DIAGNOSIS — K219 Gastro-esophageal reflux disease without esophagitis: Secondary | ICD-10-CM | POA: Diagnosis not present

## 2017-09-18 DIAGNOSIS — R7301 Impaired fasting glucose: Secondary | ICD-10-CM | POA: Diagnosis not present

## 2017-09-18 DIAGNOSIS — J45901 Unspecified asthma with (acute) exacerbation: Secondary | ICD-10-CM | POA: Diagnosis not present

## 2017-09-18 DIAGNOSIS — E781 Pure hyperglyceridemia: Secondary | ICD-10-CM | POA: Diagnosis not present

## 2017-09-20 DIAGNOSIS — Z6825 Body mass index (BMI) 25.0-25.9, adult: Secondary | ICD-10-CM | POA: Diagnosis not present

## 2017-09-20 DIAGNOSIS — J45901 Unspecified asthma with (acute) exacerbation: Secondary | ICD-10-CM | POA: Diagnosis not present

## 2017-09-25 DIAGNOSIS — J060 Acute laryngopharyngitis: Secondary | ICD-10-CM | POA: Diagnosis not present

## 2017-09-25 DIAGNOSIS — Z6824 Body mass index (BMI) 24.0-24.9, adult: Secondary | ICD-10-CM | POA: Diagnosis not present

## 2017-09-30 DIAGNOSIS — E781 Pure hyperglyceridemia: Secondary | ICD-10-CM | POA: Diagnosis not present

## 2017-09-30 DIAGNOSIS — J309 Allergic rhinitis, unspecified: Secondary | ICD-10-CM | POA: Diagnosis not present

## 2017-09-30 DIAGNOSIS — K5900 Constipation, unspecified: Secondary | ICD-10-CM | POA: Diagnosis not present

## 2017-09-30 DIAGNOSIS — N390 Urinary tract infection, site not specified: Secondary | ICD-10-CM | POA: Diagnosis not present

## 2017-09-30 DIAGNOSIS — Z6825 Body mass index (BMI) 25.0-25.9, adult: Secondary | ICD-10-CM | POA: Diagnosis not present

## 2017-09-30 DIAGNOSIS — E782 Mixed hyperlipidemia: Secondary | ICD-10-CM | POA: Diagnosis not present

## 2017-09-30 DIAGNOSIS — K219 Gastro-esophageal reflux disease without esophagitis: Secondary | ICD-10-CM | POA: Diagnosis not present

## 2017-09-30 DIAGNOSIS — R109 Unspecified abdominal pain: Secondary | ICD-10-CM | POA: Diagnosis not present

## 2017-09-30 DIAGNOSIS — E119 Type 2 diabetes mellitus without complications: Secondary | ICD-10-CM | POA: Diagnosis not present

## 2017-09-30 DIAGNOSIS — Z6826 Body mass index (BMI) 26.0-26.9, adult: Secondary | ICD-10-CM | POA: Diagnosis not present

## 2017-09-30 DIAGNOSIS — R300 Dysuria: Secondary | ICD-10-CM | POA: Diagnosis not present

## 2017-09-30 DIAGNOSIS — M545 Low back pain: Secondary | ICD-10-CM | POA: Diagnosis not present

## 2017-10-03 ENCOUNTER — Other Ambulatory Visit: Payer: Self-pay | Admitting: Family Medicine

## 2017-10-03 DIAGNOSIS — J4540 Moderate persistent asthma, uncomplicated: Secondary | ICD-10-CM

## 2017-10-04 DIAGNOSIS — B009 Herpesviral infection, unspecified: Secondary | ICD-10-CM | POA: Diagnosis not present

## 2017-10-04 DIAGNOSIS — E1121 Type 2 diabetes mellitus with diabetic nephropathy: Secondary | ICD-10-CM | POA: Diagnosis not present

## 2017-10-04 DIAGNOSIS — K219 Gastro-esophageal reflux disease without esophagitis: Secondary | ICD-10-CM | POA: Diagnosis not present

## 2017-10-04 DIAGNOSIS — G4700 Insomnia, unspecified: Secondary | ICD-10-CM | POA: Diagnosis not present

## 2017-10-04 DIAGNOSIS — E282 Polycystic ovarian syndrome: Secondary | ICD-10-CM | POA: Diagnosis not present

## 2017-10-04 DIAGNOSIS — E782 Mixed hyperlipidemia: Secondary | ICD-10-CM | POA: Diagnosis not present

## 2017-10-04 DIAGNOSIS — J309 Allergic rhinitis, unspecified: Secondary | ICD-10-CM | POA: Diagnosis not present

## 2017-10-04 DIAGNOSIS — Z0001 Encounter for general adult medical examination with abnormal findings: Secondary | ICD-10-CM | POA: Diagnosis not present

## 2017-10-04 DIAGNOSIS — E781 Pure hyperglyceridemia: Secondary | ICD-10-CM | POA: Diagnosis not present

## 2017-10-04 DIAGNOSIS — Z6824 Body mass index (BMI) 24.0-24.9, adult: Secondary | ICD-10-CM | POA: Diagnosis not present

## 2017-10-15 ENCOUNTER — Other Ambulatory Visit (HOSPITAL_COMMUNITY): Payer: Self-pay | Admitting: Internal Medicine

## 2017-10-15 ENCOUNTER — Encounter: Payer: Self-pay | Admitting: Allergy and Immunology

## 2017-10-15 ENCOUNTER — Other Ambulatory Visit: Payer: Self-pay | Admitting: Rheumatology

## 2017-10-15 ENCOUNTER — Ambulatory Visit (INDEPENDENT_AMBULATORY_CARE_PROVIDER_SITE_OTHER): Payer: PPO | Admitting: Allergy and Immunology

## 2017-10-15 ENCOUNTER — Ambulatory Visit (HOSPITAL_COMMUNITY): Admission: RE | Admit: 2017-10-15 | Payer: PPO | Source: Ambulatory Visit

## 2017-10-15 VITALS — BP 108/64 | HR 95 | Resp 16 | Ht 59.5 in | Wt 124.8 lb

## 2017-10-15 DIAGNOSIS — M7122 Synovial cyst of popliteal space [Baker], left knee: Secondary | ICD-10-CM

## 2017-10-15 DIAGNOSIS — J3089 Other allergic rhinitis: Secondary | ICD-10-CM

## 2017-10-15 DIAGNOSIS — J4540 Moderate persistent asthma, uncomplicated: Secondary | ICD-10-CM

## 2017-10-15 MED ORDER — FLUTICASONE FUROATE-VILANTEROL 100-25 MCG/INH IN AEPB: INHALATION_SPRAY | RESPIRATORY_TRACT | 3 refills | Status: DC

## 2017-10-15 MED ORDER — FLUTICASONE FUROATE-VILANTEROL 200-25 MCG/INH IN AEPB: 1.0000 | INHALATION_SPRAY | Freq: Every day | RESPIRATORY_TRACT | 3 refills | Status: DC

## 2017-10-15 MED ORDER — IPRATROPIUM BROMIDE 0.06 % NA SOLN: NASAL | 1 refills | Status: DC

## 2017-10-15 MED ORDER — ALBUTEROL SULFATE HFA 108 (90 BASE) MCG/ACT IN AERS: 2.0000 | INHALATION_SPRAY | RESPIRATORY_TRACT | 1 refills | Status: DC | PRN

## 2017-10-15 MED ORDER — MONTELUKAST SODIUM 10 MG PO TABS: 10.0000 mg | ORAL_TABLET | Freq: Every day | ORAL | 1 refills | Status: DC

## 2017-10-15 MED ORDER — FLUTICASONE PROPIONATE 50 MCG/ACT NA SUSP: 2.0000 | Freq: Every day | NASAL | 3 refills | Status: DC

## 2017-10-15 NOTE — Assessment & Plan Note (Signed)
   Continue appropriate allergen avoidance measures, ipratropium nasal spray, and fluticasone nasal spray as needed.  Nasal saline spray (i.e., Simply Saline) or nasal saline lavage (i.e., NeilMed) is recommended as needed and prior to medicated nasal sprays.

## 2017-10-15 NOTE — Patient Instructions (Addendum)
Moderate persistent asthma Currently with suboptimal control.  We will step up therapy at this time.  A sample and prescription have been provided for Brio 200-25 g, 1 inhalation daily.  Continue montelukast 10 mg daily bedtime and albuterol every 4-6 hours if needed.  Subjective and objective measures of pulmonary function will be followed and the treatment plan will be adjusted accordingly.  Non-seasonal allergic rhinitis  Continue appropriate allergen avoidance measures, ipratropium nasal spray, and fluticasone nasal spray as needed.  Nasal saline spray (i.e., Simply Saline) or nasal saline lavage (i.e., NeilMed) is recommended as needed and prior to medicated nasal sprays.   Return in about 5 months (around 03/17/2018), or if symptoms worsen or fail to improve.

## 2017-10-15 NOTE — Telephone Encounter (Addendum)
Last visit: 08/13/2017  Next visit: 01/13/2018 Labs: 06/06/17 Plts are mildly elevated. We will continue to monitor. Glucose is very elevated.  Left message to advise patient she is due for labs  Okay to refill 30 days supply per Dr. Estanislado Pandy

## 2017-10-15 NOTE — Assessment & Plan Note (Signed)
Currently with suboptimal control.  We will step up therapy at this time.  A sample and prescription have been provided for Brio 200-25 g, 1 inhalation daily.  Continue montelukast 10 mg daily bedtime and albuterol every 4-6 hours if needed.  Subjective and objective measures of pulmonary function will be followed and the treatment plan will be adjusted accordingly.

## 2017-10-15 NOTE — Progress Notes (Signed)
Follow-up Note  RE: Stephanie Norris MRN: [id number] DOB: 05-13-1955 Date of Office Visit: 10/15/2017  Primary care provider: Celene Squibb, MD Referring provider: Celene Squibb, MD  History of present illness: Stephanie Norris is a 62 y.o. female with persistent asthma and allergic rhinitis presenting today for follow-up.  She was last seen in this clinic on May 26, 2017 by Dr. Ernst Bowler.  She reports that she had been taking Brio Ellipta 100-25 g, 1 inhalation daily, however developed bronchitis while on this medication.  Therefore, her primary care physician switched her to Symbicort 160-4.5 g, 2 inhalations twice daily.  She notes that she was not using a spacer device with her HFA inhaler.  While on this medication she also developed bronchitis.  She believes that she would like to go back to [employer].  She takes montelukast 10 mg daily at bedtime.  She takes ipratropium nasal spray and fluticasone nasal spray to control her rhinitis symptoms.  She currently is not using saline irrigation.  Assessment and plan: Moderate persistent asthma Currently with suboptimal control.  We will step up therapy at this time.  A sample and prescription have been provided for Brio 200-25 g, 1 inhalation daily.  Continue montelukast 10 mg daily bedtime and albuterol every 4-6 hours if needed.  Subjective and objective measures of pulmonary function will be followed and the treatment plan will be adjusted accordingly.  Non-seasonal allergic rhinitis  Continue appropriate allergen avoidance measures, ipratropium nasal spray, and fluticasone nasal spray as needed.  Nasal saline spray (i.e., Simply Saline) or nasal saline lavage (i.e., NeilMed) is recommended as needed and prior to medicated nasal sprays.   Meds ordered this encounter  Medications  . DISCONTD: fluticasone furoate-vilanterol (BREO ELLIPTA) 100-25 MCG/INH AEPB    Sig: INHALE 1 PUFF INTO THE LUNGS DAILY    Dispense:  60 each   Refill:  3  . montelukast (SINGULAIR) 10 MG tablet    Sig: Take 1 tablet (10 mg total) by mouth at bedtime.    Dispense:  90 tablet    Refill:  1  . albuterol (PROVENTIL HFA;VENTOLIN HFA) 108 (90 Base) MCG/ACT inhaler    Sig: Inhale 2 puffs into the lungs every 4 (four) hours as needed for wheezing or shortness of breath.    Dispense:  1 Inhaler    Refill:  1  . ipratropium (ATROVENT) 0.06 % nasal spray    Sig: USE 2 SPRAYS IN EACH NOSTRIL THREE TIMES DAILY    Dispense:  45 mL    Refill:  1    Patient must make appointment for further refills.  . fluticasone (FLONASE) 50 MCG/ACT nasal spray    Sig: Place 2 sprays into both nostrils daily.    Dispense:  16 g    Refill:  3  . fluticasone furoate-vilanterol (BREO ELLIPTA) 200-25 MCG/INH AEPB    Sig: Inhale 1 puff into the lungs daily.    Dispense:  1 each    Refill:  3    Diagnostics: Spirometry:  Normal with an FEV1 of 101% predicted.  Please see scanned spirometry results for details.    Physical examination: Blood pressure 108/64, pulse 95, resp. rate 16, height 4' 11.5" (1.511 m), weight 124 lb 12.8 oz (56.6 kg), SpO2 95 %.  General: Alert, interactive, in no acute distress. HEENT: TMs pearly gray, turbinates mildly edematous without discharge, post-pharynx mildly erythematous. Neck: Supple without lymphadenopathy. Lungs: Clear to auscultation without wheezing, rhonchi or rales. CV:  Normal S1, S2 without murmurs. Skin: Warm and dry, without lesions or rashes.  The following portions of the patient's history were reviewed and updated as appropriate: allergies, current medications, past family history, past medical history, past social history, past surgical history and problem list.  Allergies as of 10/15/2017      Reactions   Nsaids Anaphylaxis   Red Dye Rash   Itching Itching   Bactrim [sulfamethoxazole-trimethoprim]    Upset stomach   Cephalexin    rash   Levofloxacin    rash   Nitrofurantoin Monohyd Macro     rash      Medication List        Accurate as of 10/15/17  5:28 PM. Always use your most recent med list.          albuterol 108 (90 Base) MCG/ACT inhaler Commonly known as:  PROVENTIL HFA;VENTOLIN HFA Inhale 2 puffs into the lungs every 4 (four) hours as needed for wheezing or shortness of breath.   Alpha-Lipoic Acid 200 MG Caps Take 1 capsule by mouth at bedtime.   atorvastatin 10 MG tablet Commonly known as:  LIPITOR TK 1 T PO HS   diclofenac sodium 1 % Gel Commonly known as:  VOLTAREN Apply 3 grams to three large joints up to three times daily as needed   DULoxetine 60 MG capsule Commonly known as:  CYMBALTA TAKE 1 CAPSULE BY MOUTH EVERY DAY   Fish Oil 1000 MG Caps Take 1 capsule by mouth daily.   fluticasone 50 MCG/ACT nasal spray Commonly known as:  FLONASE Place 2 sprays into both nostrils daily.   fluticasone furoate-vilanterol 200-25 MCG/INH Aepb Commonly known as:  BREO ELLIPTA Inhale 1 puff into the lungs daily.   folic acid 1 MG tablet Commonly known as:  FOLVITE Take 2 tablets (2 mg total) by mouth daily.   [place] as needed.   INVOKANA 100 MG Tabs tablet Generic drug:  canagliflozin Take 100 mg by mouth daily before breakfast.   ipratropium 0.06 % nasal spray Commonly known as:  ATROVENT USE 2 SPRAYS IN EACH NOSTRIL THREE TIMES DAILY   levocetirizine 5 MG tablet Commonly known as:  XYZAL Take 5 mg by mouth every evening.   metFORMIN 500 MG 24 hr tablet Commonly known as:  GLUCOPHAGE-XR Take 1,000 mg by mouth 2 (two) times daily.   methotrexate 2.5 MG tablet Commonly known as:  RHEUMATREX TAKE 6 TABLETS BY MOUTH 1 TIME EVERY WEEK   montelukast 10 MG tablet Commonly known as:  SINGULAIR Take 1 tablet (10 mg total) by mouth at bedtime.   MULTIVITAMIN PO Take by mouth.   ondansetron 4 MG tablet Commonly known as:  ZOFRAN Take 1 tablet (4 mg total) by mouth every 6 (six) hours as needed for nausea or  vomiting.   ONETOUCH VERIO test strip Generic drug:  glucose blood   pantoprazole 40 MG tablet Commonly known as:  PROTONIX Take 1 tablet by mouth 2 (two) times daily.   rizatriptan 10 MG disintegrating tablet Commonly known as:  MAXALT-MLT Take 1 tablet (10 mg total) by mouth as needed for migraine. May repeat in 2 hours if needed       Allergies  Allergen Reactions  . Nsaids Anaphylaxis  . Red Dye Rash    Itching Itching  . Bactrim [Sulfamethoxazole-Trimethoprim]     Upset stomach  . Cephalexin     rash  . Levofloxacin     rash  . Nitrofurantoin Monohyd  Macro     rash   Review of systems: Review of systems negative except as noted in HPI / PMHx or noted below: Constitutional: Negative.  HENT: Negative.   Eyes: Negative.  Respiratory: Negative.   Cardiovascular: Negative.  Gastrointestinal: Negative.  Genitourinary: Negative.  Musculoskeletal: Negative.  Neurological: Negative.  Endo/Heme/Allergies: Negative.  Cutaneous: Negative.  Past Medical History:  Diagnosis Date  . Asthma   . COPD (chronic obstructive pulmonary disease) ([place])   . Diabetes mellitus without complication ([facility])    Type 2  . Dizziness   . Fibromyalgia   . GERD (gastroesophageal reflux disease)   . Headache   . Hyperlipidemia   . OSA (obstructive sleep apnea)   . Osteoarthritis   . PCOS (polycystic ovarian syndrome)   . Rheumatoid arthritis ([place])     Family History  Problem Relation Age of Onset  . Asthma Mother   . Rheum arthritis Mother   . Allergic rhinitis Mother   . Rheum arthritis Sister   . Allergic rhinitis Sister   . Heart failure Father   . Heart disease Father   . Rheum arthritis Sister   . Allergic rhinitis Sister   . Allergic rhinitis Brother   . Allergies Unknown        "Everyone"  . Heart disease Maternal Grandfather   . Heart disease Maternal Grandmother   . Heart disease Paternal Grandfather   . Heart disease Paternal Grandmother     Social History     Socioeconomic History  . Marital status: Married    Spouse name: Not on file  . Number of children: 0  . Years of education: Not on file  . Highest education level: Not on file  Occupational History  . Occupation: Patient Account Rep    Employer: [employer]  . Financial resource strain: Not on file  . Food insecurity:    Worry: Not on file    Inability: Not on file  . Transportation needs:    Medical: Not on file    Non-medical: Not on file  Tobacco Use  . Smoking status: Never Smoker  . Smokeless tobacco: Never Used  Substance and Sexual Activity  . Alcohol use: No  . Drug use: No  . Sexual activity: Not on file  Lifestyle  . Physical activity:    Days per week: Not on file    Minutes per session: Not on file  . Stress: Not on file  Relationships  . Social connections:    Talks on phone: Not on file    Gets together: Not on file    Attends religious service: Not on file    Active member of club or organization: Not on file    Attends meetings of clubs or organizations: Not on file    Relationship status: Not on file  . Intimate partner violence:    Fear of current or ex partner: Not on file    Emotionally abused: Not on file    Physically abused: Not on file    Forced sexual activity: Not on file  Other Topics Concern  . Not on file  Social History Narrative   Lives with husband   Retired    no children   Ass[employer] degree   16 oz caffeine daily    I appreciate the opportunity to take part in Memori's care. Please do not hesitate to contact me with questions.  Sincerely,   R. Edgar Frisk, MD

## 2017-10-16 ENCOUNTER — Telehealth: Payer: Self-pay | Admitting: Rheumatology

## 2017-10-16 DIAGNOSIS — E1121 Type 2 diabetes mellitus with diabetic nephropathy: Secondary | ICD-10-CM | POA: Diagnosis not present

## 2017-10-16 DIAGNOSIS — B373 Candidiasis of vulva and vagina: Secondary | ICD-10-CM | POA: Diagnosis not present

## 2017-10-16 DIAGNOSIS — G4700 Insomnia, unspecified: Secondary | ICD-10-CM | POA: Diagnosis not present

## 2017-10-16 DIAGNOSIS — J060 Acute laryngopharyngitis: Secondary | ICD-10-CM | POA: Diagnosis not present

## 2017-10-16 DIAGNOSIS — E1140 Type 2 diabetes mellitus with diabetic neuropathy, unspecified: Secondary | ICD-10-CM | POA: Diagnosis not present

## 2017-10-16 DIAGNOSIS — E782 Mixed hyperlipidemia: Secondary | ICD-10-CM | POA: Diagnosis not present

## 2017-10-16 DIAGNOSIS — J45901 Unspecified asthma with (acute) exacerbation: Secondary | ICD-10-CM | POA: Diagnosis not present

## 2017-10-16 DIAGNOSIS — R7301 Impaired fasting glucose: Secondary | ICD-10-CM | POA: Diagnosis not present

## 2017-10-16 DIAGNOSIS — K219 Gastro-esophageal reflux disease without esophagitis: Secondary | ICD-10-CM | POA: Diagnosis not present

## 2017-10-16 NOTE — Telephone Encounter (Signed)
Contacted patient and she will have PCP fax a copy of results.

## 2017-10-16 NOTE — Telephone Encounter (Signed)
Patient left a voicemail stating she was returning your call to let you know she had her labwork 2 weeks at Dr. Wende Neighbors, PCP office in [place].

## 2017-11-11 DIAGNOSIS — M79671 Pain in right foot: Secondary | ICD-10-CM | POA: Diagnosis not present

## 2017-11-11 DIAGNOSIS — M2041 Other hammer toe(s) (acquired), right foot: Secondary | ICD-10-CM | POA: Diagnosis not present

## 2017-11-11 DIAGNOSIS — M79672 Pain in left foot: Secondary | ICD-10-CM | POA: Diagnosis not present

## 2017-11-11 DIAGNOSIS — M21371 Foot drop, right foot: Secondary | ICD-10-CM | POA: Diagnosis not present

## 2017-11-11 DIAGNOSIS — M2042 Other hammer toe(s) (acquired), left foot: Secondary | ICD-10-CM | POA: Diagnosis not present

## 2017-11-13 ENCOUNTER — Other Ambulatory Visit: Payer: Self-pay | Admitting: Rheumatology

## 2017-11-13 ENCOUNTER — Telehealth: Payer: Self-pay | Admitting: Rheumatology

## 2017-11-13 NOTE — Telephone Encounter (Signed)
Patient called stating she had her labwork done at Dr. Juel Burrow office.  Patient states she will call and have them fax the results to Dr. Estanislado Pandy.

## 2017-11-13 NOTE — Telephone Encounter (Signed)
She needs labs prior to methotrexate refill.  You may send her a letter if she is not responding to the phone calls.

## 2017-11-13 NOTE — Telephone Encounter (Signed)
Last visit: 08/13/2017  Next visit: 01/13/2018 Labs: 06/06/17 Plts are mildly elevated. We will continue to monitor. Glucose is very elevated.  Attempted to contact patient and left message for patient to advise she is due for labs.   Okay to refill 30 day supply MTX?

## 2017-11-14 NOTE — Telephone Encounter (Signed)
Patient advised we have not received lab work from Dr. Juel Burrow office. Patient will call their office to have them re-fax it.

## 2017-11-15 ENCOUNTER — Other Ambulatory Visit: Payer: Self-pay | Admitting: *Deleted

## 2017-11-15 MED ORDER — METHOTREXATE 2.5 MG PO TABS: ORAL_TABLET | ORAL | 0 refills | Status: DC

## 2017-11-15 NOTE — Telephone Encounter (Signed)
Received lab results from PCP drawn on 09/30/17 shows WBC 13.8 Platelets 469 Netrophiils 8.7 Lymphs 3.9 Glucose 131 BUN 29 Calcium 10.4 Triglycerides 359 Hdl 30 VLDL 72 Hgb A1C 7  Patient is on 6 tabs of MTX weekly. Per Hazel Sams, PA-C okay to refill MTX  Last Visit: 08/13/2017  Next Visit: 01/13/2018

## 2017-11-21 DIAGNOSIS — G9009 Other idiopathic peripheral autonomic neuropathy: Secondary | ICD-10-CM | POA: Diagnosis not present

## 2017-11-21 DIAGNOSIS — Z0000 Encounter for general adult medical examination without abnormal findings: Secondary | ICD-10-CM | POA: Diagnosis not present

## 2017-11-21 DIAGNOSIS — Z6825 Body mass index (BMI) 25.0-25.9, adult: Secondary | ICD-10-CM | POA: Diagnosis not present

## 2017-11-21 DIAGNOSIS — M21371 Foot drop, right foot: Secondary | ICD-10-CM | POA: Diagnosis not present

## 2017-11-21 DIAGNOSIS — M545 Low back pain: Secondary | ICD-10-CM | POA: Diagnosis not present

## 2017-11-26 DIAGNOSIS — E782 Mixed hyperlipidemia: Secondary | ICD-10-CM | POA: Diagnosis not present

## 2017-11-26 DIAGNOSIS — J45909 Unspecified asthma, uncomplicated: Secondary | ICD-10-CM | POA: Diagnosis not present

## 2017-11-26 DIAGNOSIS — E119 Type 2 diabetes mellitus without complications: Secondary | ICD-10-CM | POA: Diagnosis not present

## 2017-12-03 DIAGNOSIS — M2041 Other hammer toe(s) (acquired), right foot: Secondary | ICD-10-CM | POA: Diagnosis not present

## 2017-12-03 DIAGNOSIS — M6281 Muscle weakness (generalized): Secondary | ICD-10-CM | POA: Diagnosis not present

## 2017-12-03 DIAGNOSIS — R29898 Other symptoms and signs involving the musculoskeletal system: Secondary | ICD-10-CM | POA: Diagnosis not present

## 2017-12-03 DIAGNOSIS — M21379 Foot drop, unspecified foot: Secondary | ICD-10-CM | POA: Diagnosis not present

## 2017-12-05 DIAGNOSIS — N3946 Mixed incontinence: Secondary | ICD-10-CM | POA: Diagnosis not present

## 2017-12-05 DIAGNOSIS — N3020 Other chronic cystitis without hematuria: Secondary | ICD-10-CM | POA: Diagnosis not present

## 2017-12-06 DIAGNOSIS — M545 Low back pain: Secondary | ICD-10-CM | POA: Diagnosis not present

## 2017-12-06 DIAGNOSIS — R030 Elevated blood-pressure reading, without diagnosis of hypertension: Secondary | ICD-10-CM | POA: Diagnosis not present

## 2017-12-06 DIAGNOSIS — M21371 Foot drop, right foot: Secondary | ICD-10-CM | POA: Diagnosis not present

## 2017-12-12 ENCOUNTER — Other Ambulatory Visit (HOSPITAL_COMMUNITY): Payer: Self-pay | Admitting: Neurosurgery

## 2017-12-12 DIAGNOSIS — M21371 Foot drop, right foot: Secondary | ICD-10-CM

## 2017-12-17 ENCOUNTER — Ambulatory Visit (HOSPITAL_COMMUNITY)
Admission: RE | Admit: 2017-12-17 | Discharge: 2017-12-17 | Disposition: A | Discharge status: Home / Self Care | Payer: PPO | Source: Ambulatory Visit | Attending: Neurosurgery | Admitting: Neurosurgery

## 2017-12-17 DIAGNOSIS — M5137 Other intervertebral disc degeneration, lumbosacral region: Secondary | ICD-10-CM | POA: Insufficient documentation

## 2017-12-17 DIAGNOSIS — M5126 Other intervertebral disc displacement, lumbar region: Secondary | ICD-10-CM | POA: Insufficient documentation

## 2017-12-17 DIAGNOSIS — M48061 Spinal stenosis, lumbar region without neurogenic claudication: Secondary | ICD-10-CM | POA: Diagnosis not present

## 2017-12-17 DIAGNOSIS — M21371 Foot drop, right foot: Secondary | ICD-10-CM | POA: Diagnosis not present

## 2017-12-18 DIAGNOSIS — E119 Type 2 diabetes mellitus without complications: Secondary | ICD-10-CM | POA: Diagnosis not present

## 2017-12-18 DIAGNOSIS — J45909 Unspecified asthma, uncomplicated: Secondary | ICD-10-CM | POA: Diagnosis not present

## 2017-12-18 DIAGNOSIS — E782 Mixed hyperlipidemia: Secondary | ICD-10-CM | POA: Diagnosis not present

## 2017-12-19 DIAGNOSIS — N3946 Mixed incontinence: Secondary | ICD-10-CM | POA: Diagnosis not present

## 2017-12-23 DIAGNOSIS — G629 Polyneuropathy, unspecified: Secondary | ICD-10-CM | POA: Diagnosis not present

## 2017-12-23 DIAGNOSIS — G5731 Lesion of lateral popliteal nerve, right lower limb: Secondary | ICD-10-CM | POA: Diagnosis not present

## 2017-12-30 NOTE — Progress Notes (Signed)
Office Visit Note  Patient: Stephanie Norris             Date of Birth: 09-06-1955           MRN: [id number]             PCP: Celene Squibb, MD Referring: Celene Squibb, MD Visit Date: 01/13/2018 Occupation: _0 @  Subjective:  Neck pain   History of Present Illness: Stephanie Norris is a 62 y.o. female with history of seropositive rheumatoid arthritis, osteoarthritis, fibromyalgia, and DDD. Patient is taking MTX 6 tablets by mouth once weekly and folic acid 2 mg po daily.  She is taking Cymbalta 60 mg 1 tablet po daily.  She has not had any recent rheumatoid arthritis flares.  She has intermittent pain and swelling in both hands.  She has chronic pain in both feet due to neuropathy.  She denies any other joint pain or joint swelling.  She continues to have frequent fibromyalgia flares.  She states she continues to have chronic fatigue. She states she has had difficulty falling asleep for the past 3 weeks. She reports she has been having increased neck pain and stiffness.  She denies any symptoms of radiculopathy.  She has been using voltaren gel on her neck, which has been helping.  She has trapezius muscle tension and tenderness.  She has been working on ROM exercises. She states her lower back is doing well.  She had a MRI of the lumbar spine on 12/17/17.  She followed up with her PCP today.  She had lab work drawn which will be sent to [place].  She has had the seasonal influenza vaccination.    Activities of Daily Living:  Patient reports morning stiffness for 30  minutes.   Patient Reports nocturnal pain.  Difficulty dressing/grooming: Denies Difficulty climbing stairs: Denies Difficulty getting out of chair: Denies Difficulty using hands for taps, buttons, cutlery, and/or writing: Reports  Review of Systems  Constitutional: Positive for fatigue.  HENT: Positive for nose dryness (Nose sores). Negative for mouth sores and mouth dryness.   Eyes: Negative for pain, visual disturbance and  dryness.  Respiratory: Negative for cough, hemoptysis, shortness of breath and difficulty breathing.   Cardiovascular: Negative for chest pain, palpitations, hypertension and swelling in legs/feet.  Gastrointestinal: Negative for blood in stool, constipation and diarrhea.  Endocrine: Negative for increased urination.  Genitourinary: Negative for painful urination.  Musculoskeletal: Positive for arthralgias, joint pain, myalgias, morning stiffness, muscle tenderness and myalgias. Negative for joint swelling and muscle weakness.  Skin: Negative for color change, pallor, rash, hair loss, nodules/bumps, skin tightness, ulcers and sensitivity to sunlight.  Allergic/Immunologic: Negative for susceptible to infections.  Neurological: Negative for dizziness, numbness, headaches and weakness.  Hematological: Negative for swollen glands.  Psychiatric/Behavioral: Positive for sleep disturbance. Negative for depressed mood. The patient is not nervous/anxious.     PMFS History:  Patient Active Problem List   Diagnosis Date Noted  . Vertigo 08/20/2016  . Eustachian tube dysfunction 04/17/2016  . Abdominal pain 03/02/2016  . Gastroenteritis 03/02/2016  . Rheumatoid arthritis with rheumatoid factor of multiple sites without organ or systems involvement ([place]) 01/13/2016  . HLA B27 (HLA B27 positive) 01/13/2016  . Osteoarthritis of foot 01/13/2016  . DJD (degenerative joint disease), cervical 01/13/2016  . Spondylosis of lumbar region without myelopathy or radiculopathy 01/13/2016  . Primary osteoarthritis of both hands 01/13/2016  . Non-seasonal allergic rhinitis 08/13/2014  . Stiffness of joints, not elsewhere classified, multiple sites  08/26/2013  . Flu-like symptoms 03/02/2013  . Lactic acidosis 03/02/2013  . Sinus tachycardia 03/02/2013  . Elevated lactic acid level 03/02/2013  . Influenza due to identified novel influenza A virus with other respiratory manifestations 03/02/2013  . Hyperglycemia  03/02/2013  . Fibromyalgia   . OSA (obstructive sleep apnea) 06/22/2011  . DOE (dyspnea on exertion) 05/30/2011  . Moderate persistent asthma 03/30/2011    Past Medical History:  Diagnosis Date  . Asthma   . COPD (chronic obstructive pulmonary disease) ([place])   . Diabetes mellitus without complication ([facility])    Type 2  . Dizziness   . Fibromyalgia   . GERD (gastroesophageal reflux disease)   . Headache   . Hyperlipidemia   . OSA (obstructive sleep apnea)   . Osteoarthritis   . PCOS (polycystic ovarian syndrome)   . Rheumatoid arthritis ([place])     Family History  Problem Relation Age of Onset  . Asthma Mother   . Rheum arthritis Mother   . Allergic rhinitis Mother   . Rheum arthritis Sister   . Allergic rhinitis Sister   . Heart failure Father   . Heart disease Father   . Rheum arthritis Sister   . Allergic rhinitis Sister   . Allergic rhinitis Brother   . Allergies Unknown        "Everyone"  . Heart disease Maternal Grandfather   . Heart disease Maternal Grandmother   . Heart disease Paternal Grandfather   . Heart disease Paternal Grandmother    Past Surgical History:  Procedure Laterality Date  . APPENDECTOMY    . CHOLECYSTECTOMY    . COLONOSCOPY W/ BIOPSIES  fall 2009  . COMBINED HYSTERECTOMY VAGINAL / OOPHORECTOMY / A&P REPAIR  10/2002   endometriosis, cystocele, fibroids  . KNEE SURGERY  1996  . VESICOVAGINAL FISTULA CLOSURE W/ TAH     Social History   Social History Narrative   Lives with husband   Retired    no children   Ass[employer] degree   16 oz caffeine daily   Immunization History  Administered Date(s) Administered  . Influenza Whole 10/07/2010, 11/06/2011  . Influenza,inj,Quad PF,6+ Mos 11/05/2012, 11/02/2015, 11/22/2016  . Influenza-Unspecified 11/05/2013  . Pneumococcal Conjugate-13 11/06/2014  . Pneumococcal Polysaccharide-23 11/02/2015    Objective: Vital Signs: BP 126/81 (BP Location: Right Arm, Patient Position: Sitting, Cuff Size: Normal)    Pulse 81   Resp 13   Ht 4' 11.75" (1.518 m)   Wt 126 lb (57.2 kg)   BMI 24.81 kg/m    Physical Exam  Constitutional: She is oriented to person, place, and time. She appears well-developed and well-nourished.  HENT:  Head: Normocephalic and atraumatic.  Eyes: Conjunctivae and EOM are normal.  Neck: Normal range of motion.  Cardiovascular: Normal rate, regular rhythm, normal heart sounds and intact distal pulses.  Pulmonary/Chest: Effort normal and breath sounds normal.  Abdominal: Soft. Bowel sounds are normal.  Lymphadenopathy:    She has no cervical adenopathy.  Neurological: She is alert and oriented to person, place, and time.  Skin: Skin is warm and dry. Capillary refill takes less than 2 seconds.  Psychiatric: She has a normal mood and affect. Her behavior is normal.  Nursing note and vitals reviewed.    Musculoskeletal Exam: C-spine slightly limited ROM with lateral rotation. Thoracic and lumbar spine good ROM.  No midline spinal tenderness.  Bilateral SI joint crepitus.  Positive tender points.  Trapezius muscle tenderness.  Shoulder joints, elbow joints, wrist joints, MCPs, PIPs, and  DIPs good ROM with no synovitis.  PIP and DIP synovial thickening. Mucin cyst left index finger. Hip joints, knee joints, ankle joints, MTPs, PIPs, and DIPs good ROM with no synovitis. Bilateral knee joint crepitus.  No warmth or effusion of knee joints.  No tenderness or swelling of ankle joints.  Tenderness of bilateral trochanteric bursa.   CDAI Exam: CDAI Score: 0.6  Patient Global Assessment: 3 (mm); Provider Global Assessment: 3 (mm) Swollen: 0 ; Tender: 0  Joint Exam   Not documented   There is currently no information documented on the homunculus. Go to the Rheumatology activity and complete the homunculus joint exam.  Investigation: No additional findings.  Imaging: Mr Lumbar Spine Wo Contrast  Result Date: 12/17/2017 CLINICAL DATA:  Right foot drop EXAM: MRI LUMBAR SPINE  WITHOUT CONTRAST TECHNIQUE: Multiplanar, multisequence MR imaging of the lumbar spine was performed. No intravenous contrast was administered. COMPARISON:  Lumbar radiographs 06/27/2012 FINDINGS: Segmentation:  Normal Alignment:  Normal Vertebrae: Negative for fracture or mass. Normal bone marrow signal. Conus medullaris and cauda equina: Conus extends to the L1-2 level. Conus and cauda equina appear normal. Paraspinal and other soft tissues: Negative for paraspinous mass or edema. Disc levels: L1-2: Negative L2-3: Negative L3-4: Mild disc degeneration and disc bulging. Small left foraminal disc extrusion without significant foraminal encroachment. Spinal canal adequate in size. L4-5: Disc degeneration with disc bulging. Central annular fissure with small upgoing disc material. No significant spinal or foraminal stenosis. L5-S1: Moderate disc degeneration with disc space narrowing. Mild spurring without significant stenosis. IMPRESSION: Small left foraminal disc protrusion L3-4 without neural impingement Central annular fissure with small upgoing disc protrusion extrusion at L3-4. Negative for stenosis Moderate disc degeneration L5-S1 without stenosis. Electronically Signed   By: Franchot Gallo M.D.   On: 12/17/2017 09:20    Recent Labs: Lab Results  Component Value Date   WBC 9.2 06/06/2017   HGB 12.4 06/06/2017   PLT 439 (H) 06/06/2017   NA 136 06/06/2017   K 4.8 06/06/2017   CL 102 06/06/2017   CO2 24 06/06/2017   GLUCOSE 248 (H) 06/06/2017   BUN 21 06/06/2017   CREATININE 0.93 06/06/2017   BILITOT 0.3 06/06/2017   ALKPHOS 54 09/10/2016   AST 18 06/06/2017   ALT 21 06/06/2017   PROT 6.4 06/06/2017   ALBUMIN 4.0 09/10/2016   CALCIUM 9.8 06/06/2017   GFRAA 75 06/06/2017    Speciality Comments: No specialty comments available.  Procedures:  No procedures performed Allergies: Nsaids; Red dye; Bactrim [sulfamethoxazole-trimethoprim]; Cephalexin; Levofloxacin; and Nitrofurantoin monohyd  macro  Assessment / Plan:     Visit Diagnoses: Rheumatoid arthritis with rheumatoid factor of multiple sites without organ or systems involvement ([place]) - +CCP: She has no active synovitis.  She has not had any recent rheumatoid arthritis flares.  She is clinically doing well on methotrexate 6 tablets by mouth once weekly and folic acid 2 mg po daily.  She continues to have intermittent pain in both hands. She will continue on this current treatment regimen. She does not need any refills at this time.  She was advised to notify us if she develops increased joint pain or joint swelling.  She will follow up in 5 months.   HLA B27 (HLA B27 positive) - No clinical features of spondyloarthropathy.   High risk medication use - MTX 6 tablets po once weekly, folic acid 1 mg po daily. She had CBC and CMP drawn today at PCPs office.  She will have lab  results faxed to our office.  She had the seasonal influenza vaccination.  We discussed the importance of receiving the shingrix vaccination.Patient has received Prevnar 13 and Pneumovax 23.  We also discussed the importance of holding MTX whenever she is sick or on antibiotics due to the immunosuppressive effects.   Primary osteoarthritis of both hands: She has PIP and DIP synovial thickening.  She has a mucin cyst on the left index finger.  She has no synovitis.  Joint protection and muscle strengthening was discussed.   Primary osteoarthritis of both feet: She has PIP and DIP synovial thickening consistent with osteoarthritis of both feet.  She wears proper fitting shoes.   DDD (degenerative disc disease), cervical: She has slightly limited lateral rotation. Good flexion and extension.  She has no symptoms of radiculopathy.  She has trapezius muscle tension and tenderness bilaterally.  She has been applying voltaren gel topically on the posterior aspect of her neck.  She has been trying to work on ROM exercises.  She was given a handout of additional exercises that  she can perform.  She declined further imaging at this time.  She was advised to notify us or Dr. Arnoldo Morale if her pain worsens or if she starts experiencing symptoms of radiculopathy.   DDD (degenerative disc disease), lumbar: She has no midline spinal tenderness. She has no symptoms of sciatica or radiculopathy at this time. She had a MRI of the lumbar spine on 12/17/17. MRI revealed small left foraminal disc protusion L3-L4 without neural impingement and moderate disc degeneration L5-S1 without stenosis.  She will continue to follow up with Dr. Arnoldo Morale.   Fibromyalgia: She has positive tender points on exam.  She has trapezius muscle tension and tenderness.  She has tenderness of bilateral trochanteric bursa.   She was encouraged to stay active and exercise on a regular basis.  She continues to have chronic fatigue, which has been worsening over the past 3 weeks due to experiencing difficulty falling asleep.  She does not take a sleep-aid.  Good sleep hygiene was discussed. She continues to take Cymbalta 60 mg po daily.  She does not need any refills at this time.   Trochanteric bursitis of both hips: She has tenderness of bilateral trochanteric bursa.  She performs stretching exercises on a regular basis.  Other fatigue: Chronic and related to insomnia.  She has had increased difficulty falling asleep the past 3 weeks.  She was encouraged to stay active and exercise on a regular basis.   Other medical conditions are listed as follows:   History of sleep apnea  History of depression  History of asthma  History of diabetes mellitus   Orders: No orders of the defined types were placed in this encounter.  No orders of the defined types were placed in this encounter.    Follow-Up Instructions: Return in about 5 months (around 06/14/2018) for Rheumatoid arthritis, Osteoarthritis, DDD, Fibromyalgia.   Ofilia Neas, PA-C  Note - This record has been created using Dragon software.  Chart  creation errors have been sought, but may not always  have been located. Such creation errors do not reflect on  the standard of medical care.

## 2018-01-01 DIAGNOSIS — N3946 Mixed incontinence: Secondary | ICD-10-CM | POA: Diagnosis not present

## 2018-01-13 ENCOUNTER — Encounter: Payer: Self-pay | Admitting: Physician Assistant

## 2018-01-13 ENCOUNTER — Encounter (INDEPENDENT_AMBULATORY_CARE_PROVIDER_SITE_OTHER): Payer: Self-pay

## 2018-01-13 ENCOUNTER — Ambulatory Visit: Payer: PPO | Admitting: Physician Assistant

## 2018-01-13 VITALS — BP 126/81 | HR 81 | Resp 13 | Ht 59.75 in | Wt 126.0 lb

## 2018-01-13 DIAGNOSIS — E1121 Type 2 diabetes mellitus with diabetic nephropathy: Secondary | ICD-10-CM | POA: Diagnosis not present

## 2018-01-13 DIAGNOSIS — M7061 Trochanteric bursitis, right hip: Secondary | ICD-10-CM

## 2018-01-13 DIAGNOSIS — M19072 Primary osteoarthritis, left ankle and foot: Secondary | ICD-10-CM

## 2018-01-13 DIAGNOSIS — Z8639 Personal history of other endocrine, nutritional and metabolic disease: Secondary | ICD-10-CM

## 2018-01-13 DIAGNOSIS — M797 Fibromyalgia: Secondary | ICD-10-CM | POA: Diagnosis not present

## 2018-01-13 DIAGNOSIS — M5136 Other intervertebral disc degeneration, lumbar region: Secondary | ICD-10-CM

## 2018-01-13 DIAGNOSIS — Z1589 Genetic susceptibility to other disease: Secondary | ICD-10-CM | POA: Diagnosis not present

## 2018-01-13 DIAGNOSIS — M0579 Rheumatoid arthritis with rheumatoid factor of multiple sites without organ or systems involvement: Secondary | ICD-10-CM | POA: Diagnosis not present

## 2018-01-13 DIAGNOSIS — M19041 Primary osteoarthritis, right hand: Secondary | ICD-10-CM | POA: Diagnosis not present

## 2018-01-13 DIAGNOSIS — Z8669 Personal history of other diseases of the nervous system and sense organs: Secondary | ICD-10-CM

## 2018-01-13 DIAGNOSIS — Z8709 Personal history of other diseases of the respiratory system: Secondary | ICD-10-CM

## 2018-01-13 DIAGNOSIS — E782 Mixed hyperlipidemia: Secondary | ICD-10-CM | POA: Diagnosis not present

## 2018-01-13 DIAGNOSIS — Z8659 Personal history of other mental and behavioral disorders: Secondary | ICD-10-CM | POA: Diagnosis not present

## 2018-01-13 DIAGNOSIS — E1140 Type 2 diabetes mellitus with diabetic neuropathy, unspecified: Secondary | ICD-10-CM | POA: Diagnosis not present

## 2018-01-13 DIAGNOSIS — R5383 Other fatigue: Secondary | ICD-10-CM

## 2018-01-13 DIAGNOSIS — Z79899 Other long term (current) drug therapy: Secondary | ICD-10-CM | POA: Diagnosis not present

## 2018-01-13 DIAGNOSIS — R7301 Impaired fasting glucose: Secondary | ICD-10-CM | POA: Diagnosis not present

## 2018-01-13 DIAGNOSIS — M5030 Other cervical disc degeneration, unspecified cervical region: Secondary | ICD-10-CM | POA: Diagnosis not present

## 2018-01-13 DIAGNOSIS — M19042 Primary osteoarthritis, left hand: Secondary | ICD-10-CM

## 2018-01-13 DIAGNOSIS — E119 Type 2 diabetes mellitus without complications: Secondary | ICD-10-CM | POA: Diagnosis not present

## 2018-01-13 DIAGNOSIS — M7062 Trochanteric bursitis, left hip: Secondary | ICD-10-CM

## 2018-01-13 DIAGNOSIS — M19071 Primary osteoarthritis, right ankle and foot: Secondary | ICD-10-CM | POA: Diagnosis not present

## 2018-01-13 NOTE — Patient Instructions (Signed)
Neck Exercises Neck exercises can be important for many reasons:  They can help you to improve and maintain flexibility in your neck. This can be especially important as you age.  They can help to make your neck stronger. This can make movement easier.  They can reduce or prevent neck pain.  They may help your upper back.  Ask your health care provider which neck exercises would be best for you. Exercises Neck Press Repeat this exercise 10 times. Do it first thing in the morning and right before bed or as told by your health care provider. 1. Lie on your back on a firm bed or on the floor with a pillow under your head. 2. Use your neck muscles to push your head down on the pillow and straighten your spine. 3. Hold the position as well as you can. Keep your head facing up and your chin tucked. 4. Slowly count to 5 while holding this position. 5. Relax for a few seconds. Then repeat.  Isometric Strengthening Do a full set of these exercises 2 times a day or as told by your health care provider. 1. Sit in a supportive chair and place your hand on your forehead. 2. Push forward with your head and neck while pushing back with your hand. Hold for 10 seconds. 3. Relax. Then repeat the exercise 3 times. 4. Next, do thesequence again, this time putting your hand against the back of your head. Use your head and neck to push backward against the hand pressure. 5. Finally, do the same exercise on either side of your head, pushing sideways against the pressure of your hand.  Prone Head Lifts Repeat this exercise 5 times. Do this 2 times a day or as told by your health care provider. 1. Lie face-down, resting on your elbows so that your chest and upper back are raised. 2. Start with your head facing downward, near your chest. Position your chin either on or near your chest. 3. Slowly lift your head upward. Lift until you are looking straight ahead. Then continue lifting your head as far back as  you can stretch. 4. Hold your head up for 5 seconds. Then slowly lower it to your starting position.  Supine Head Lifts Repeat this exercise 8-10 times. Do this 2 times a day or as told by your health care provider. 1. Lie on your back, bending your knees to point to the ceiling and keeping your feet flat on the floor. 2. Lift your head slowly off the floor, raising your chin toward your chest. 3. Hold for 5 seconds. 4. Relax and repeat.  Scapular Retraction Repeat this exercise 5 times. Do this 2 times a day or as told by your health care provider. 1. Stand with your arms at your sides. Look straight ahead. 2. Slowly pull both shoulders backward and downward until you feel a stretch between your shoulder blades in your upper back. 3. Hold for 10-30 seconds. 4. Relax and repeat.  Contact a health care provider if:  Your neck pain or discomfort gets much worse when you do an exercise.  Your neck pain or discomfort does not improve within 2 hours after you exercise. If you have any of these problems, stop exercising right away. Do not do the exercises again unless your health care provider says that you can. Get help right away if:  You develop sudden, severe neck pain. If this happens, stop exercising right away. Do not do the exercises again unless your   health care provider says that you can. Exercises Neck Stretch  Repeat this exercise 3-5 times. 1. Do this exercise while standing or while sitting in a chair. 2. Place your feet flat on the floor, shoulder-width apart. 3. Slowly turn your head to the right. Turn it all the way to the right so you can look over your right shoulder. Do not tilt or tip your head. 4. Hold this position for 10-30 seconds. 5. Slowly turn your head to the left, to look over your left shoulder. 6. Hold this position for 10-30 seconds.  Neck Retraction Repeat this exercise 8-10 times. Do this 3-4 times a day or as told by your health care  provider. 1. Do this exercise while standing or while sitting in a sturdy chair. 2. Look straight ahead. Do not bend your neck. 3. Use your fingers to push your chin backward. Do not bend your neck for this movement. Continue to face straight ahead. If you are doing the exercise properly, you will feel a slight sensation in your throat and a stretch at the back of your neck. 4. Hold the stretch for 1-2 seconds. Relax and repeat.  This information is not intended to replace advice given to you by your health care provider. Make sure you discuss any questions you have with your health care provider. Document Released: 01/03/2015 Document Revised: 06/30/2015 Document Reviewed: 08/02/2014 Elsevier Interactive Patient Education  2018 [employer]  

## 2018-01-16 DIAGNOSIS — E782 Mixed hyperlipidemia: Secondary | ICD-10-CM | POA: Diagnosis not present

## 2018-01-16 DIAGNOSIS — J302 Other seasonal allergic rhinitis: Secondary | ICD-10-CM | POA: Diagnosis not present

## 2018-01-16 DIAGNOSIS — K219 Gastro-esophageal reflux disease without esophagitis: Secondary | ICD-10-CM | POA: Diagnosis not present

## 2018-01-16 DIAGNOSIS — M21371 Foot drop, right foot: Secondary | ICD-10-CM | POA: Diagnosis not present

## 2018-01-16 DIAGNOSIS — E282 Polycystic ovarian syndrome: Secondary | ICD-10-CM | POA: Diagnosis not present

## 2018-01-16 DIAGNOSIS — E119 Type 2 diabetes mellitus without complications: Secondary | ICD-10-CM | POA: Diagnosis not present

## 2018-01-16 DIAGNOSIS — G9009 Other idiopathic peripheral autonomic neuropathy: Secondary | ICD-10-CM | POA: Diagnosis not present

## 2018-01-21 ENCOUNTER — Ambulatory Visit (HOSPITAL_COMMUNITY): Payer: PPO

## 2018-01-24 ENCOUNTER — Encounter

## 2018-01-31 ENCOUNTER — Other Ambulatory Visit: Payer: Self-pay

## 2018-01-31 ENCOUNTER — Ambulatory Visit (HOSPITAL_COMMUNITY): Payer: PPO | Attending: Podiatry

## 2018-01-31 ENCOUNTER — Encounter (HOSPITAL_COMMUNITY): Payer: Self-pay

## 2018-01-31 DIAGNOSIS — R262 Difficulty in walking, not elsewhere classified: Secondary | ICD-10-CM | POA: Insufficient documentation

## 2018-01-31 DIAGNOSIS — M21371 Foot drop, right foot: Secondary | ICD-10-CM | POA: Insufficient documentation

## 2018-01-31 DIAGNOSIS — M6281 Muscle weakness (generalized): Secondary | ICD-10-CM | POA: Diagnosis not present

## 2018-01-31 NOTE — Patient Instructions (Signed)
Access Code: [email or web address]  URL: [email or web address]  Date: 01/31/2018  Prepared by: Geraldine Solar   Exercises Seated Toe Raise - 10 reps - 3 sets - 1x daily - 7x weekly Heel Toe Raises with Counter Support - 10 reps - 3 sets - 1x daily - 7x weekly Gastroc Stretch on Wall - 3-5 reps - 30-60sec hold - 1-2x daily - 7x weekly Soleus Stretch on Wall - 3-5 reps - 30-60sec hold - 1-2x daily - 7x weekly

## 2018-01-31 NOTE — Therapy (Signed)
[place], [place], [address] Phone: [phone]   Fax:  [phone]  Physical Therapy Evaluation  Patient Details  Name: Stephanie Norris MRN: [id number] Date of Birth: 64/10/1955 Referring Provider (PT): Rosemary Holms, DPM   Encounter Date: 01/31/2018  PT End of Session - 01/31/18 1613    Visit Number  1    Number of Visits  2    Date for PT Re-Evaluation  02/14/18    Authorization Type  [employer]    Authorization Time Period  01/31/18 to 02/14/18    Authorization - Visit Number  1    Authorization - Number of Visits  10    PT Start Time  1510    PT Stop Time  1600    PT Time Calculation (min)  50 min    Activity Tolerance  Patient tolerated treatment well    Behavior During Therapy  [facility] for tasks assessed/performed       Past Medical History:  Diagnosis Date  . Asthma   . COPD (chronic obstructive pulmonary disease) ([place])   . Diabetes mellitus without complication (HCC)    Type 2  . Dizziness   . Fibromyalgia   . GERD (gastroesophageal reflux disease)   . Headache   . Hyperlipidemia   . OSA (obstructive sleep apnea)   . Osteoarthritis   . PCOS (polycystic ovarian syndrome)   . Rheumatoid arthritis [facility])     Past Surgical History:  Procedure Laterality Date  . APPENDECTOMY    . CHOLECYSTECTOMY    . COLONOSCOPY W/ BIOPSIES  fall 2009  . COMBINED HYSTERECTOMY VAGINAL / OOPHORECTOMY / A&P REPAIR  10/2002   endometriosis, cystocele, fibroids  . KNEE SURGERY  1996  . VESICOVAGINAL FISTULA CLOSURE W/ TAH      There were no vitals filed for this visit.   Subjective Assessment - 01/31/18 1515    Subjective  Pt states that she has drop foot on her R foot. She is not sure what caused it. She states she broke her sesmoid bone in 1999 and thinks that because of the way she walked after that may have caused nerve damage. She has LBP but does not have radicular s/s and an MRI r/o that her back  was causing her foot drop. She states that she also has peripheral neropathy and does has DM but her MDs told her it wasn't related to her foot drop. This has been going on for ~5 months and it gradually worsened. She states she started tripping and noticed that she couldn't pull her foot up. Dr. Fritzi Mandes is not sure what has caused her foot drop and is reffering [name] to a neurologist which she sees in January 2020. Bil feet stay numb and both can [place] at night.     Limitations  Walking;Standing;House hold activities    How long can you sit comfortably?  1 hour    How long can you stand comfortably?  10 mins, R limits > L    How long can you walk comfortably?  30 mins     Patient Stated Goals  get foot back up    Currently in Pain?  No/denies         [facility] PT Assessment - 01/31/18 0001      Assessment   Medical Diagnosis  R weakness, gait training, dorsiflexion assistance    Referring Provider (PT)  Rosemary Holms, DPM    Onset Date/Surgical Date  --  5 months ago   Next MD Visit  no f/u with Dr. Fritzi Mandes; waiting to the see neurologist 03/05/18    Prior Therapy  yes for LBP      Balance Screen   Has the patient fallen in the past 6 months  No   multiple close calls prior to the brace   Has the patient had a decrease in activity level because of a fear of falling?   No    Is the patient reluctant to leave their home because of a fear of falling?   No      Prior Function   Level of Independence  Independent    Vocation  Retired    Leisure  go out to eat, read, watch TV, solitare      Observation/Other Assessments   Observations  difficult for pt to perform weighted ankle DF on R    Focus on Therapeutic Outcomes (FOTO)   n/a as pt only coming for 1 f/u visit      Sensation   Light Touch  Impaired Detail    Light Touch Impaired Details  Impaired RLE   L4, L5 and S1 slightly diminished feeling on R ocmpared to L   Additional Comments  DTRs: L2 2+ on L, 3+ on R; L5/S1: 0 on L,  2+ on R      ROM / Strength   AROM / PROM / Strength  AROM;Strength      AROM   AROM Assessment Site  Ankle    Right/Left Ankle  Right;Left    Right Ankle Dorsiflexion  0    Right Ankle Plantar Flexion  60    Right Ankle Inversion  20    Right Ankle Eversion  10    Left Ankle Dorsiflexion  5    Left Ankle Plantar Flexion  60    Left Ankle Inversion  28    Left Ankle Eversion  11      Strength   Strength Assessment Site  Hip;Knee;Ankle    Right Hip Flexion  5/5    Right Hip Extension  --   weak based on functional assessment   Right Hip ABduction  --   weak based on functional assessment   Left Hip Flexion  5/5    Left Hip Extension  --   weak based on functional assessment   Left Hip ABduction  --   weak based on functional assessment   Right Knee Flexion  4+/5    Right Knee Extension  5/5    Left Knee Flexion  4+/5    Left Knee Extension  5/5    Right Ankle Dorsiflexion  3/5    Right Ankle Plantar Flexion  4/5    Right Ankle Inversion  5/5    Right Ankle Eversion  4+/5    Left Ankle Dorsiflexion  5/5    Left Ankle Plantar Flexion  4/5    Left Ankle Inversion  4+/5    Left Ankle Eversion  5/5      Ambulation/Gait   Ambulation Distance (Feet)  396 Feet    Assistive device  None    Gait Pattern  Step-through pattern;Decreased dorsiflexion - right   bil foot ER     Balance   Balance Assessed  Yes      Static Standing Balance   Static Standing - Balance Support  No upper extremity supported    Static Standing Balance -  Activities   Single Leg Stance - Right Leg;Single  Leg Stance - Left Leg    Static Standing - Comment/# of Minutes  R: 12sec or < L: 7.7 sec or <          Objective measurements completed on examination: See above findings.        PT Education - 01/31/18 1613    Education Details  exam findings, HEP, POC    Person(s) Educated  Patient    Methods  Explanation;Demonstration;Handout    Comprehension  Verbalized understanding       PT  Short Term Goals - 01/31/18 1616      PT SHORT TERM GOAL #1   Title  Pt will be independent with HEP and perform consistently in order to improve R ankle DF strength.     Time  2    Period  Weeks    Status  New    Target Date  02/14/18      PT SHORT TERM GOAL #2   Title  Pt will have improved R ankle DF strength to 4-/5 to demo improved strength and maximize her gait and balance.     Time  2    Period  Weeks    Status  New        PT Long Term Goals - 01/31/18 1616      PT LONG TERM GOAL #1   Title  n/a as pt only coming for 1 f/u visit for financial reasons             Plan - 01/31/18 1613    Clinical Impression Statement  Pt is pleasant 62YO F who presents to OPPT with reports of R foot drop of insidious onset. She does have h/o chronic LBP but states her MDs have r/o that her back is contributing to her foot drop and pt also denies any radicular symptoms down either LE. She also has DM and peripheral neuropathy but again, states that her MDs have r.o any connection between her DM and her R foot drop. Pt noted to have deficits in R ankle strength, functional strength based on functional assessment, and mild deficits in balance. Pt has 3/5 R ankle DF strength and was able to get to neutral AROM. Pt also noted to have increased R toe flexion on digits 2-5 but PT able to fully extend them passively (minor pain reported with this) and per pt, her R great toe MTP joint has been fused/replaced so it has essentially no flexion or extension ROM. Pt able to ambulate without her AFO but demo'd reduced DF throughout. Pt needs skilled PT intervention to address these impairments to improve her strength and maximize gait. Pt wishing to only have 1 f/u visit for HEP due to financial reasons.    Clinical Presentation  Stable    Clinical Decision Making  Moderate    Rehab Potential  Fair    PT Frequency  1x / week    PT Duration  Other (comment)   1 week   PT Treatment/Interventions   ADLs/Self Care Home Management;Cryotherapy;Electrical Stimulation;Gait training;Functional mobility training;Therapeutic activities;Therapeutic exercise;Balance training;Neuromuscular re-education;Patient/family education;Passive range of motion;Taping;Manual techniques;Spinal Manipulations;Joint Manipulations;Stair training    PT Next Visit Plan  review goals; provide extensive HEP additions as her last visit -- great toe stretching/mobility as able, hip strengthening, intrinsic foot strength, eccentric DF, SLS, tandem stance, etc.     PT Home Exercise Plan  12/27: seated and standing DF, standing gastroc and soleus stretch    Consulted and Agree with Plan of Care  Patient       Patient will benefit from skilled therapeutic intervention in order to improve the following deficits and impairments:  Abnormal gait, Decreased activity tolerance, Decreased balance, Decreased range of motion, Decreased strength, Difficulty walking, Hypomobility, Increased fascial restricitons, Increased muscle spasms, Impaired flexibility, Impaired sensation, Improper body mechanics, Postural dysfunction, Pain  Visit Diagnosis: Foot drop, right - Plan: PT plan of care cert/re-cert  Muscle weakness (generalized) - Plan: PT plan of care cert/re-cert  Difficulty in walking, not elsewhere classified - Plan: PT plan of care cert/re-cert     Problem List Patient Active Problem List   Diagnosis Date Noted  . Vertigo 08/20/2016  . Eustachian tube dysfunction 04/17/2016  . Abdominal pain 03/02/2016  . Gastroenteritis 03/02/2016  . Rheumatoid arthritis with rheumatoid factor of multiple sites without organ or systems involvement ([place]) 01/13/2016  . HLA B27 (HLA B27 positive) 01/13/2016  . Osteoarthritis of foot 01/13/2016  . DJD (degenerative joint disease), cervical 01/13/2016  . Spondylosis of lumbar region without myelopathy or radiculopathy 01/13/2016  . Primary osteoarthritis of both hands 01/13/2016  .  Non-seasonal allergic rhinitis 08/13/2014  . Stiffness of joints, not elsewhere classified, multiple sites 08/26/2013  . Flu-like symptoms 03/02/2013  . Lactic acidosis 03/02/2013  . Sinus tachycardia 03/02/2013  . Elevated lactic acid level 03/02/2013  . Influenza due to identified novel influenza A virus with other respiratory manifestations 03/02/2013  . Hyperglycemia 03/02/2013  . Fibromyalgia   . OSA (obstructive sleep apnea) 06/22/2011  . DOE (dyspnea on exertion) 05/30/2011  . Moderate persistent asthma 03/30/2011        Geraldine Solar PT, DPT    [facility] [address] [place], [place], [address] Phone: [phone]   Fax:  [phone]  Name: NICOLAS SISLER MRN: [id number] Date of Birth: Oct 19, 1955

## 2018-02-10 ENCOUNTER — Encounter (HOSPITAL_COMMUNITY): Payer: Self-pay

## 2018-02-10 ENCOUNTER — Ambulatory Visit (HOSPITAL_COMMUNITY): Payer: PPO | Attending: Podiatry

## 2018-02-10 DIAGNOSIS — M21371 Foot drop, right foot: Secondary | ICD-10-CM | POA: Insufficient documentation

## 2018-02-10 DIAGNOSIS — M6281 Muscle weakness (generalized): Secondary | ICD-10-CM | POA: Diagnosis not present

## 2018-02-10 DIAGNOSIS — R262 Difficulty in walking, not elsewhere classified: Secondary | ICD-10-CM | POA: Diagnosis not present

## 2018-02-10 NOTE — Therapy (Signed)
[facility] [address] [place], [place], [address] Phone: [phone]   Fax:  [phone]   PHYSICAL THERAPY DISCHARGE SUMMARY  Visits from Start of Care: 2  Current functional level related to goals / functional outcomes: See below   Remaining deficits: See eval note   Education / Equipment: HEP  Plan: Patient agrees to discharge.  Patient goals were partially met. Patient is being discharged due to financial reasons.  ?????      Physical Therapy Treatment  Patient Details  Name: Stephanie Norris MRN: [id number] Date of Birth: 1955-11-21 Referring Provider (PT): Rosemary Holms, DPM   Encounter Date: 02/10/2018  PT End of Session - 02/10/18 1307    Visit Number  2    Number of Visits  2    Date for PT Re-Evaluation  02/14/18    Authorization Type  [employer]    Authorization Time Period  01/31/18 to 02/14/18    Authorization - Visit Number  2    Authorization - Number of Visits  10    PT Start Time  1304    PT Stop Time  1329    PT Time Calculation (min)  25 min    Activity Tolerance  Patient tolerated treatment well    Behavior During Therapy  [facility] for tasks assessed/performed       Past Medical History:  Diagnosis Date  . Asthma   . COPD (chronic obstructive pulmonary disease) ([place])   . Diabetes mellitus without complication (HCC)    Type 2  . Dizziness   . Fibromyalgia   . GERD (gastroesophageal reflux disease)   . Headache   . Hyperlipidemia   . OSA (obstructive sleep apnea)   . Osteoarthritis   . PCOS (polycystic ovarian syndrome)   . Rheumatoid arthritis [facility]     Past Surgical History:  Procedure Laterality Date  . APPENDECTOMY    . CHOLECYSTECTOMY    . COLONOSCOPY W/ BIOPSIES  fall 2009  . COMBINED HYSTERECTOMY VAGINAL / OOPHORECTOMY / A&P REPAIR  10/2002   endometriosis, cystocele, fibroids  . KNEE SURGERY  1996  . VESICOVAGINAL FISTULA CLOSURE W/ TAH      There were no vitals filed  for this visit.  Subjective Assessment - 02/10/18 1338    Subjective  Pt reports that she has been compliant with her HEP. She is not as sore as she anticipated.    Limitations  Walking;Standing;House hold activities    How long can you sit comfortably?  1 hour    How long can you stand comfortably?  10 mins, R limits > L    How long can you walk comfortably?  30 mins     Patient Stated Goals  get foot back up    Currently in Pain?  No/denies         [facility] PT Assessment - 02/10/18 0001      Assessment   Medical Diagnosis  R weakness, gait training, dorsiflexion assistance    Referring Provider (PT)  Rosemary Holms, DPM    Onset Date/Surgical Date  --   5 months ago   Next MD Visit  no f/u with Dr. Fritzi Mandes; waiting to the see neurologist 03/05/18    Prior Therapy  yes for LBP         PT Education - 02/10/18 1336    Education Details  extensive HEP additions, frequency/duration of activities    Person(s) Educated  Patient    Methods  Explanation;Demonstration;Handout    Comprehension  Verbalized understanding;Returned demonstration       PT Short Term Goals - 02/10/18 1336      PT SHORT TERM GOAL #1   Title  Pt will be independent with HEP and perform consistently in order to improve R ankle DF strength.     Time  2    Period  Weeks    Status  Achieved      PT SHORT TERM GOAL #2   Title  Pt will have improved R ankle DF strength to 4-/5 to demo improved strength and maximize her gait and balance.     Time  2    Period  Weeks    Status  On-going        PT Long Term Goals - 01/31/18 1616      PT LONG TERM GOAL #1   Title  n/a as pt only coming for 1 f/u visit for financial reasons            Plan - 02/10/18 1335    Clinical Impression Statement  Pt returns for her 1 f/u visit this date. PT provided pt with extensive HEP additions which included great to extension stretch, 4-way ankle with theraband, foot intrinsic strengthening, hip strengthening, and  balance activities. PT went through each exercise in detail to ensure understanding and pt was able to demo understanding. Pt educated on frequency, duration of exercises and that she could return with referral if needed and she verbalized understanding. Pt d/c to HEP at this time due to pt preference.     Rehab Potential  Fair    PT Frequency  1x / week    PT Duration  Other (comment)   1 week   PT Treatment/Interventions  ADLs/Self Care Home Management;Cryotherapy;Electrical Stimulation;Gait training;Functional mobility training;Therapeutic activities;Therapeutic exercise;Balance training;Neuromuscular re-education;Patient/family education;Passive range of motion;Taping;Manual techniques;Spinal Manipulations;Joint Manipulations;Stair training    PT Next Visit Plan  d/c to HEP    PT Home Exercise Plan  12/27: seated and standing DF, standing gastroc and soleus stretch; 1/6: see below for extensive additions    Consulted and Agree with Plan of Care  Patient       Patient will benefit from skilled therapeutic intervention in order to improve the following deficits and impairments:  Abnormal gait, Decreased activity tolerance, Decreased balance, Decreased range of motion, Decreased strength, Difficulty walking, Hypomobility, Increased fascial restricitons, Increased muscle spasms, Impaired flexibility, Impaired sensation, Improper body mechanics, Postural dysfunction, Pain  Visit Diagnosis: Foot drop, right  Muscle weakness (generalized)  Difficulty in walking, not elsewhere classified     Problem List Patient Active Problem List   Diagnosis Date Noted  . Vertigo 08/20/2016  . Eustachian tube dysfunction 04/17/2016  . Abdominal pain 03/02/2016  . Gastroenteritis 03/02/2016  . Rheumatoid arthritis with rheumatoid factor of multiple sites without organ or systems involvement ([place]) 01/13/2016  . HLA B27 (HLA B27 positive) 01/13/2016  . Osteoarthritis of foot 01/13/2016  . DJD  (degenerative joint disease), cervical 01/13/2016  . Spondylosis of lumbar region without myelopathy or radiculopathy 01/13/2016  . Primary osteoarthritis of both hands 01/13/2016  . Non-seasonal allergic rhinitis 08/13/2014  . Stiffness of joints, not elsewhere classified, multiple sites 08/26/2013  . Flu-like symptoms 03/02/2013  . Lactic acidosis 03/02/2013  . Sinus tachycardia 03/02/2013  . Elevated lactic acid level 03/02/2013  . Influenza due to identified novel influenza A virus with other respiratory manifestations 03/02/2013  . Hyperglycemia 03/02/2013  . Fibromyalgia   .  OSA (obstructive sleep apnea) 06/22/2011  . DOE (dyspnea on exertion) 05/30/2011  . Moderate persistent asthma 03/30/2011        Geraldine Solar PT, DPT  [facility] [address] [place], [place], [address] Phone: [phone]   Fax:  [phone]  Name: BARNEY GERTSCH MRN: [id number] Date of Birth: 11/24/1955

## 2018-02-10 NOTE — Patient Instructions (Signed)
Access Code: [email or web address]  URL: [email or web address]  Date: 02/10/2018  Prepared by: Geraldine Solar   Exercises Standing Toe Dorsiflexion Stretch - 10 reps - 3 sets - 1x daily - 7x weekly Ankle Dorsiflexion with Resistance - 10 reps - 3 sets - 1x daily - 7x weekly Ankle Inversion with Resistance - 10 reps - 3 sets - 1x daily - 7x weekly Ankle Eversion with Resistance - 10 reps - 3 sets - 1x daily - 7x weekly Ankle and Toe Plantarflexion with Resistance - 10 reps - 3 sets - 1x daily - 7x weekly Towel Scrunches - 10 reps - 3 sets - 1x daily - 7x weekly Seated Ankle Alphabet - 10 reps - 3 sets - 1x daily - 7x weekly Supine Bridge - 10 reps - 3 sets - 1x daily - 7x weekly Supine Active Straight Leg Raise - 10 reps - 3 sets - 1x daily - 7x weekly Clamshell with Resistance - 10 reps - 3 sets - 1x daily - 7x weekly Sidelying Hip Abduction - 10 reps - 3 sets - 1x daily - 7x weekly Standing Hip Abduction with Resistance at Ankles - 10 reps - 3 sets - 1x daily - 7x weekly Side Stepping with Resistance at Ankles - 10 reps - 3 sets - 1x daily - 7x weekly Single Leg Stance - 10 reps - 3 sets - 1x daily - 7x weekly Standing 3-Way Kick - 10 reps - 3 sets - 1x daily - 7x weekly Tandem Stance in Corner - 10 reps - 3 sets - as long as you can hold - 1x daily - 7x weekly

## 2018-02-14 DIAGNOSIS — E782 Mixed hyperlipidemia: Secondary | ICD-10-CM | POA: Diagnosis not present

## 2018-02-14 DIAGNOSIS — E1121 Type 2 diabetes mellitus with diabetic nephropathy: Secondary | ICD-10-CM | POA: Diagnosis not present

## 2018-02-14 DIAGNOSIS — E119 Type 2 diabetes mellitus without complications: Secondary | ICD-10-CM | POA: Diagnosis not present

## 2018-02-14 DIAGNOSIS — E781 Pure hyperglyceridemia: Secondary | ICD-10-CM | POA: Diagnosis not present

## 2018-02-17 ENCOUNTER — Other Ambulatory Visit: Payer: Self-pay | Admitting: Rheumatology

## 2018-02-17 NOTE — Telephone Encounter (Signed)
Last Visit: 01/13/18 Next Visit: 06/16/18 Labs: 01/13/18 Absolute Lymph 3.6, BUN Creat. Ratio 29  Okay to refill per Dr. Estanislado Pandy

## 2018-03-04 DIAGNOSIS — K5900 Constipation, unspecified: Secondary | ICD-10-CM | POA: Diagnosis not present

## 2018-03-04 DIAGNOSIS — J302 Other seasonal allergic rhinitis: Secondary | ICD-10-CM | POA: Diagnosis not present

## 2018-03-04 DIAGNOSIS — Z0000 Encounter for general adult medical examination without abnormal findings: Secondary | ICD-10-CM | POA: Diagnosis not present

## 2018-03-04 DIAGNOSIS — E282 Polycystic ovarian syndrome: Secondary | ICD-10-CM | POA: Diagnosis not present

## 2018-03-04 DIAGNOSIS — J309 Allergic rhinitis, unspecified: Secondary | ICD-10-CM | POA: Diagnosis not present

## 2018-03-04 DIAGNOSIS — R300 Dysuria: Secondary | ICD-10-CM | POA: Diagnosis not present

## 2018-03-04 DIAGNOSIS — E119 Type 2 diabetes mellitus without complications: Secondary | ICD-10-CM | POA: Diagnosis not present

## 2018-03-04 DIAGNOSIS — R109 Unspecified abdominal pain: Secondary | ICD-10-CM | POA: Diagnosis not present

## 2018-03-04 DIAGNOSIS — R319 Hematuria, unspecified: Secondary | ICD-10-CM | POA: Diagnosis not present

## 2018-03-04 DIAGNOSIS — R3981 Functional urinary incontinence: Secondary | ICD-10-CM | POA: Diagnosis not present

## 2018-03-04 DIAGNOSIS — E781 Pure hyperglyceridemia: Secondary | ICD-10-CM | POA: Diagnosis not present

## 2018-03-04 DIAGNOSIS — E1140 Type 2 diabetes mellitus with diabetic neuropathy, unspecified: Secondary | ICD-10-CM | POA: Diagnosis not present

## 2018-03-04 DIAGNOSIS — K219 Gastro-esophageal reflux disease without esophagitis: Secondary | ICD-10-CM | POA: Diagnosis not present

## 2018-03-04 DIAGNOSIS — J060 Acute laryngopharyngitis: Secondary | ICD-10-CM | POA: Diagnosis not present

## 2018-03-04 DIAGNOSIS — E782 Mixed hyperlipidemia: Secondary | ICD-10-CM | POA: Diagnosis not present

## 2018-03-04 DIAGNOSIS — Z6824 Body mass index (BMI) 24.0-24.9, adult: Secondary | ICD-10-CM | POA: Diagnosis not present

## 2018-03-04 DIAGNOSIS — J45909 Unspecified asthma, uncomplicated: Secondary | ICD-10-CM | POA: Diagnosis not present

## 2018-03-04 DIAGNOSIS — B009 Herpesviral infection, unspecified: Secondary | ICD-10-CM | POA: Diagnosis not present

## 2018-03-04 DIAGNOSIS — D72829 Elevated white blood cell count, unspecified: Secondary | ICD-10-CM | POA: Diagnosis not present

## 2018-03-04 DIAGNOSIS — B373 Candidiasis of vulva and vagina: Secondary | ICD-10-CM | POA: Diagnosis not present

## 2018-03-04 DIAGNOSIS — E1121 Type 2 diabetes mellitus with diabetic nephropathy: Secondary | ICD-10-CM | POA: Diagnosis not present

## 2018-03-04 DIAGNOSIS — G9009 Other idiopathic peripheral autonomic neuropathy: Secondary | ICD-10-CM | POA: Diagnosis not present

## 2018-03-04 DIAGNOSIS — G4700 Insomnia, unspecified: Secondary | ICD-10-CM | POA: Diagnosis not present

## 2018-03-04 DIAGNOSIS — M21371 Foot drop, right foot: Secondary | ICD-10-CM | POA: Diagnosis not present

## 2018-03-05 ENCOUNTER — Encounter: Payer: Self-pay | Admitting: Diagnostic Neuroimaging

## 2018-03-05 ENCOUNTER — Ambulatory Visit: Payer: PPO | Admitting: Diagnostic Neuroimaging

## 2018-03-05 VITALS — BP 115/75 | HR 73 | Ht 59.75 in | Wt 124.2 lb

## 2018-03-05 DIAGNOSIS — G629 Polyneuropathy, unspecified: Secondary | ICD-10-CM | POA: Diagnosis not present

## 2018-03-05 NOTE — Progress Notes (Signed)
[facility]  PATIENT: Stephanie Norris DOB: 01/22/1956  REFERRING CLINICIAN: Merlyn Albert, MD  HISTORY FROM: patient and husband and  chart review REASON FOR VISIT: follow up   HISTORICAL  CHIEF COMPLAINT:  Chief Complaint  Patient presents with  . New Consult    Referred by Dr. Arnoldo Norris  . Peripheral Neuropathy    HISTORY OF PRESENT ILLNESS:   UPDATE (03/05/18, VRP): Since last visit, here for evaluation of neuropathy. Reports numbness in feet (bilateral) since 2000. Also with high arch and hammer toes. Diabetes dx'd 2015; has been on treatment since 2018. Some numbness in hands intermittently since 016. Last A1c 8.5 in 2019. Could not tolerate gabapentin (1 dose many years ago). Also with stress, anxiety, insomnia, pain, fibromyalgia. Significant marital strain.  In August 2019, noted right foot drop. Went to Dr. Arnoldo Norris NSGY, and MRI lumbar spine was negative. Had EMG/NCS showing severe neuropathy in bilateral lower extremities. Then sent here for eval.   UPDATE (07/15/17, VRP): Since last visit, doing well. Tolerating meds. HA are improved. Tolerating rizatriptan as needed. No alleviating or aggravating factors.   PRIOR HPI (01/09/17): 63 year old female with rheumatoid arthritis, osteoporosis, diabetes, hypercholesterolemia, depression, fibromyalgia, here for evaluation of headaches and dizziness.  The past 1 year patient has had onset of headache pressure of blood rushing sensation in head, 1-2 times per week, associated with photophobia, phonophobia, dizziness, lasting up to 1 day at a time.  Sometimes she has a weird feeling on the top of her head which moves around.  Sometimes she has spinning sensation.  She has used Excedrin Migraine with mild relief.  Her sister also has migraines.  Patient never had similar symptoms in the past.   REVIEW OF SYSTEMS: Full 14 system review of systems performed and negative with exception of: neck pain runny nose depression.     ALLERGIES: Allergies  Allergen Reactions  . Nsaids Anaphylaxis  . Red Dye Rash    Itching Itching  . Bactrim [Sulfamethoxazole-Trimethoprim]     Upset stomach  . Cephalexin     rash  . Levofloxacin     rash  . Nitrofurantoin Monohyd Macro     rash    HOME MEDICATIONS: Outpatient Medications Prior to Visit  Medication Sig Dispense Refill  . albuterol (PROVENTIL HFA;VENTOLIN HFA) 108 (90 Base) MCG/ACT inhaler Inhale 2 puffs into the lungs every 4 (four) hours as needed for wheezing or shortness of breath. 1 Inhaler 1  . Alpha-Lipoic Acid 200 MG CAPS Take 1 capsule by mouth at bedtime.     . canagliflozin (INVOKANA) 100 MG TABS tablet Take 100 mg by mouth daily before breakfast.    . Continuous Blood Gluc Sensor ([place]) MISC as needed.  3  . diclofenac sodium (VOLTAREN) 1 % GEL Apply 3 grams to three large joints up to three times daily as needed 3 Tube 3  . DULoxetine (CYMBALTA) 60 MG capsule TAKE 1 CAPSULE BY MOUTH EVERY DAY 90 capsule 0  . fluticasone (FLONASE) 50 MCG/ACT nasal spray Place 2 sprays into both nostrils daily. 16 g 3  . fluticasone furoate-vilanterol (BREO ELLIPTA) 200-25 MCG/INH AEPB Inhale 1 puff into the lungs daily. 1 each 3  . folic acid (FOLVITE) 1 MG tablet Take 2 tablets (2 mg total) by mouth daily. 180 tablet 4  . levocetirizine (XYZAL) 5 MG tablet Take 5 mg by mouth every evening.    . metFORMIN (GLUCOPHAGE-XR) 500 MG 24 hr tablet Take 1,000 mg by  mouth 2 (two) times daily.     . methotrexate (RHEUMATREX) 2.5 MG tablet TAKE 6 TABLETS BY MOUTH 1 TIME EVERY WEEK 72 tablet 0  . montelukast (SINGULAIR) 10 MG tablet Take 1 tablet (10 mg total) by mouth at bedtime. 90 tablet 1  . Multiple Vitamins-Minerals (MULTIVITAMIN PO) Take by mouth.    . Omega-3 Fatty Acids (FISH OIL) 1000 MG CAPS Take 1 capsule by mouth daily.    Glory Rosebush VERIO test strip     . pantoprazole (PROTONIX) 40 MG tablet Take 1 tablet by mouth 2 (two) times daily.    5   No facility-administered medications prior to visit.     PAST MEDICAL HISTORY: Past Medical History:  Diagnosis Date  . Asthma   . COPD (chronic obstructive pulmonary disease) ([place])   . Diabetes mellitus without complication (HCC)    Type 2  . Dizziness   . Fibromyalgia   . GERD (gastroesophageal reflux disease)   . Headache   . Hyperlipidemia   . OSA (obstructive sleep apnea)   . Osteoarthritis   . PCOS (polycystic ovarian syndrome)   . Rheumatoid arthritis ([place])     PAST SURGICAL HISTORY: Past Surgical History:  Procedure Laterality Date  . APPENDECTOMY    . CHOLECYSTECTOMY    . COLONOSCOPY W/ BIOPSIES  fall 2009  . COMBINED HYSTERECTOMY VAGINAL / OOPHORECTOMY / A&P REPAIR  10/2002   endometriosis, cystocele, fibroids  . KNEE SURGERY  1996  . VESICOVAGINAL FISTULA CLOSURE W/ TAH      FAMILY HISTORY: Family History  Problem Relation Age of Onset  . Asthma Mother   . Rheum arthritis Mother   . Allergic rhinitis Mother   . Rheum arthritis Sister   . Allergic rhinitis Sister   . Heart failure Father   . Heart disease Father   . Rheum arthritis Sister   . Allergic rhinitis Sister   . Allergic rhinitis Brother   . Allergies Unknown        "Everyone"  . Heart disease Maternal Grandfather   . Heart disease Maternal Grandmother   . Heart disease Paternal Grandfather   . Heart disease Paternal Grandmother     SOCIAL HISTORY:  Social History   Socioeconomic History  . Marital status: Married    Spouse name: Not on file  . Number of children: 0  . Years of education: Not on file  . Highest education level: Not on file  Occupational History  . Occupation: Patient Account Rep    Employer: [employer]  . Financial resource strain: Not on file  . Food insecurity:    Worry: Not on file    Inability: Not on file  . Transportation needs:    Medical: Not on file    Non-medical: Not on file  Tobacco Use  . Smoking status: Never  Smoker  . Smokeless tobacco: Never Used  Substance and Sexual Activity  . Alcohol use: No  . Drug use: No  . Sexual activity: Not on file  Lifestyle  . Physical activity:    Days per week: Not on file    Minutes per session: Not on file  . Stress: Not on file  Relationships  . Social connections:    Talks on phone: Not on file    Gets together: Not on file    Attends religious service: Not on file    Active member of club or organization: Not on file    Attends meetings  of clubs or organizations: Not on file    Relationship status: Not on file  . Intimate partner violence:    Fear of current or ex partner: Not on file    Emotionally abused: Not on file    Physically abused: Not on file    Forced sexual activity: Not on file  Other Topics Concern  . Not on file  Social History Narrative   Lives with husband   Retired    no children   Assoc degree   16 oz caffeine daily     PHYSICAL EXAM  GENERAL EXAM/CONSTITUTIONAL: Vitals:  Vitals:   03/05/18 0950  Weight: 124 lb 3.2 oz (56.3 kg)  Height: 4' 11.75" (1.518 m)   Body mass index is 24.46 kg/m. No exam data present  Patient is in no distress; well developed, nourished and groomed; neck is supple  CARDIOVASCULAR:  Examination of carotid arteries is normal; no carotid bruits  Regular rate and rhythm, no murmurs  Examination of peripheral vascular system by observation and palpation is normal  EYES:  Ophthalmoscopic exam of optic discs and posterior segments is normal; no papilledema or hemorrhages  MUSCULOSKELETAL:  Gait, strength, tone, movements noted in Neurologic exam below  NEUROLOGIC: MENTAL STATUS:  No flowsheet data found.  awake, alert, oriented to person, place and time  recent and remote memory intact  normal attention and concentration  language fluent, comprehension intact, naming intact,   fund of knowledge appropriate  CRANIAL NERVE:   2nd - no papilledema on fundoscopic  exam  2nd, 3rd, 4th, 6th - pupils equal and reactive to light, visual fields full to confrontation, extraocular muscles intact, no nystagmus  5th - facial sensation symmetric  7th - facial strength symmetric  8th - hearing intact  9th - palate elevates symmetrically, uvula midline  11th - shoulder shrug symmetric  12th - tongue protrusion midline  MOTOR:   normal bulk and tone, full strength in the BUE, BLE  SENSORY:   normal and symmetric to light touch  DECR TEMP AND VIB AT FEET AND TOES  DECR PP IN FEET AND TOES; GRADIENT UP TO SHINS  HIGH ARCH; HAMMER TOES BILATERALLY  COORDINATION:   finger-nose-finger, fine finger movements normal  REFLEXES:   deep tendon reflexes --> BUE 1; KNEES 3; ANKLES 2  MUTE TOES  GAIT/STATION:   narrow based gait    DIAGNOSTIC DATA (LABS, IMAGING, TESTING) - I reviewed patient records, labs, notes, testing and imaging myself where available.  Lab Results  Component Value Date   WBC 9.2 06/06/2017   HGB 12.4 06/06/2017   HCT 36.8 06/06/2017   MCV 90.0 06/06/2017   PLT 439 (H) 06/06/2017      Component Value Date/Time   NA 136 06/06/2017 0948   K 4.8 06/06/2017 0948   CL 102 06/06/2017 0948   CO2 24 06/06/2017 0948   GLUCOSE 248 (H) 06/06/2017 0948   BUN 21 06/06/2017 0948   CREATININE 0.93 06/06/2017 0948   CALCIUM 9.8 06/06/2017 0948   PROT 6.4 06/06/2017 0948   ALBUMIN 4.0 09/10/2016 0916   AST 18 06/06/2017 0948   ALT 21 06/06/2017 0948   ALKPHOS 54 09/10/2016 0916   BILITOT 0.3 06/06/2017 0948   GFRNONAA 65 06/06/2017 0948   GFRAA 75 06/06/2017 0948   No results found for: CHOL, HDL, LDLCALC, LDLDIRECT, TRIG, CHOLHDL Lab Results  Component Value Date   HGBA1C 6.7 (H) 03/02/2016   No results found for: [id number] Lab Results  Component  Value Date   TSH 2.05 02/21/2017     03/10/10 MRI brain [I reviewed images myself and agree with interpretation. -VRP]  - Normal examination.  No acute or traumatic  finding.  10/03/16 MRA head [I reviewed images myself and agree with interpretation. -VRP]  1. Patent circle of Willis. No large vessel occlusion, aneurysm, or significant stenosis is identified. 2. Mild atherosclerotic irregularity of distal cavernous ICA segments with mild stenosis. 3. No evidence for intracranial vertebrobasilar insufficiency.  01/25/18 MRI brain and IAC [I reviewed images myself and agree with interpretation. -VRP]  - No cause of headache or tinnitus identified. Mild chronic small-vessel ischemic change of the hemispheric white matter, progressive since 2012.  12/17/17 MRI lumbar spine - Small left foraminal disc protrusion L3-4 without neural impingement - Central annular fissure with small upgoing disc protrusion extrusion at L3-4. Negative for stenosis - Moderate disc degeneration L5-S1 without stenosis.  12/23/17 EMG/NCS [report; Dr. Brien Few - sensory-motor polyneuropathy (severe) affecting bilateral lower ext - superimposed right peroneal neuropathy (not localized)    ASSESSMENT AND PLAN  63 y.o. year old female here with new onset headaches and dizziness for past 1 year, with migraine features.  Also with neuropathy symptoms since 2000; now right foot drop since August 2019.  Dx:  1. Neuropathy    PLAN:  NEUROPATHY (new problem, addl workup; likely related to diabetes) - check neuropathy labs - consider gabapentin or lyrica for pain control - continue cymbalta  HEADACHES / DIZZINESS (improved; migraine) - rizatriptan as needed - consider propranolol for migraine prevention (could not tolerated topiramate in the past)  RHEUMATOID ARTHRITIS (stable) - follow up with Dr. Estanislado Pandy  FIBROMYALGIA (worsened) - optimize nutrition, exercise, sleep  SLEEP APNEA (not treated) - consider restarting CPAP; encouraged follow up with Dr. Halford Chessman  INSOMNIA / ANXIETY / CAREGIVER STRESS (worsened) - consider psychiatry / psychology eval in future if needed -  consider marriage counseling  BLURRED VISION - follow up with eye doctor (glasses)  Orders Placed This Encounter  Procedures  . Hemoglobin A1c  . Vitamin B12  . TSH  . Multiple Myeloma Panel (SPEP&IFE w/QIG)  . Angiotensin converting enzyme  . Pan-ANCA  . RPR  . HIV antibody (with reflex)   Return in about 6 months (around 09/03/2018).    Penni Bombard, MD 2/63/7858, 85:02 AM Certified in Neurology, Neurophysiology and Neuroimaging  [facility] [address] [place], [place] [address] [phone]

## 2018-03-07 LAB — MULTIPLE MYELOMA PANEL, SERUM
Albumin SerPl Elph-Mcnc: 3.9 g/dL (ref 2.9–4.4)
Albumin/Glob SerPl: 1.6 (ref 0.7–1.7)
Alpha 1: 0.2 g/dL (ref 0.0–0.4)
Alpha2 Glob SerPl Elph-Mcnc: 0.8 g/dL (ref 0.4–1.0)
B-Globulin SerPl Elph-Mcnc: 1 g/dL (ref 0.7–1.3)
Gamma Glob SerPl Elph-Mcnc: 0.6 g/dL (ref 0.4–1.8)
Globulin, Total: 2.6 g/dL (ref 2.2–3.9)
IgA/Immunoglobulin A, Serum: 268 mg/dL (ref 87–352)
IgG (Immunoglobin G), Serum: 747 mg/dL (ref 700–1600)
IgM (Immunoglobulin M), Srm: 51 mg/dL (ref 26–217)
TOTAL PROTEIN: 6.5 g/dL (ref 6.0–8.5)

## 2018-03-07 LAB — PAN-ANCA
ANCA Proteinase 3: <3.5 U/mL (ref 0.0–3.5)
Atypical pANCA: <1:20 {titer}
Myeloperoxidase Ab: <9 U/mL (ref 0.0–9.0)
P-ANCA: <1:20 {titer}

## 2018-03-07 LAB — HEMOGLOBIN A1C
Est. average glucose Bld gHb Est-mCnc: 134 mg/dL
Hgb A1c MFr Bld: 6.3 % — ABNORMAL HIGH (ref 4.8–5.6)

## 2018-03-07 LAB — HIV ANTIBODY (ROUTINE TESTING W REFLEX): HIV Screen 4th Generation wRfx: NONREACTIVE

## 2018-03-07 LAB — RPR: RPR Ser Ql: NONREACTIVE

## 2018-03-07 LAB — ANGIOTENSIN CONVERTING ENZYME: Angio Convert Enzyme: 22 U/L (ref 14–82)

## 2018-03-07 LAB — VITAMIN B12: VITAMIN B 12: 583 pg/mL (ref 232–1245)

## 2018-03-07 LAB — TSH: TSH: 2.39 u[IU]/mL (ref 0.450–4.500)

## 2018-03-10 DIAGNOSIS — E119 Type 2 diabetes mellitus without complications: Secondary | ICD-10-CM | POA: Diagnosis not present

## 2018-03-10 DIAGNOSIS — E1121 Type 2 diabetes mellitus with diabetic nephropathy: Secondary | ICD-10-CM | POA: Diagnosis not present

## 2018-03-10 DIAGNOSIS — E782 Mixed hyperlipidemia: Secondary | ICD-10-CM | POA: Diagnosis not present

## 2018-03-10 DIAGNOSIS — E781 Pure hyperglyceridemia: Secondary | ICD-10-CM | POA: Diagnosis not present

## 2018-03-11 DIAGNOSIS — M21379 Foot drop, unspecified foot: Secondary | ICD-10-CM | POA: Diagnosis not present

## 2018-03-11 DIAGNOSIS — R29898 Other symptoms and signs involving the musculoskeletal system: Secondary | ICD-10-CM | POA: Diagnosis not present

## 2018-03-11 DIAGNOSIS — M2041 Other hammer toe(s) (acquired), right foot: Secondary | ICD-10-CM | POA: Diagnosis not present

## 2018-03-11 DIAGNOSIS — M6281 Muscle weakness (generalized): Secondary | ICD-10-CM | POA: Diagnosis not present

## 2018-03-17 ENCOUNTER — Telehealth: Payer: Self-pay | Admitting: *Deleted

## 2018-03-17 NOTE — Telephone Encounter (Signed)
LVM informing the patient her labs are unremarkable and her A1c is improving.  Reviewed Dr Gladstone Lighter plan and recommendations from last office note. Left number for any questions.

## 2018-03-20 DIAGNOSIS — J060 Acute laryngopharyngitis: Secondary | ICD-10-CM | POA: Diagnosis not present

## 2018-03-25 DIAGNOSIS — M2041 Other hammer toe(s) (acquired), right foot: Secondary | ICD-10-CM | POA: Diagnosis not present

## 2018-03-25 DIAGNOSIS — M79671 Pain in right foot: Secondary | ICD-10-CM | POA: Diagnosis not present

## 2018-03-25 DIAGNOSIS — L84 Corns and callosities: Secondary | ICD-10-CM | POA: Diagnosis not present

## 2018-03-25 DIAGNOSIS — M205X1 Other deformities of toe(s) (acquired), right foot: Secondary | ICD-10-CM | POA: Diagnosis not present

## 2018-03-26 DIAGNOSIS — G9009 Other idiopathic peripheral autonomic neuropathy: Secondary | ICD-10-CM | POA: Diagnosis not present

## 2018-03-26 DIAGNOSIS — R109 Unspecified abdominal pain: Secondary | ICD-10-CM | POA: Diagnosis not present

## 2018-03-26 DIAGNOSIS — Z6824 Body mass index (BMI) 24.0-24.9, adult: Secondary | ICD-10-CM | POA: Diagnosis not present

## 2018-03-26 DIAGNOSIS — E782 Mixed hyperlipidemia: Secondary | ICD-10-CM | POA: Diagnosis not present

## 2018-03-26 DIAGNOSIS — F330 Major depressive disorder, recurrent, mild: Secondary | ICD-10-CM | POA: Diagnosis not present

## 2018-03-26 DIAGNOSIS — N390 Urinary tract infection, site not specified: Secondary | ICD-10-CM | POA: Diagnosis not present

## 2018-03-26 DIAGNOSIS — Z0000 Encounter for general adult medical examination without abnormal findings: Secondary | ICD-10-CM | POA: Diagnosis not present

## 2018-03-26 DIAGNOSIS — K219 Gastro-esophageal reflux disease without esophagitis: Secondary | ICD-10-CM | POA: Diagnosis not present

## 2018-03-26 DIAGNOSIS — M21371 Foot drop, right foot: Secondary | ICD-10-CM | POA: Diagnosis not present

## 2018-03-26 DIAGNOSIS — J309 Allergic rhinitis, unspecified: Secondary | ICD-10-CM | POA: Diagnosis not present

## 2018-03-26 DIAGNOSIS — R300 Dysuria: Secondary | ICD-10-CM | POA: Diagnosis not present

## 2018-03-26 DIAGNOSIS — J060 Acute laryngopharyngitis: Secondary | ICD-10-CM | POA: Diagnosis not present

## 2018-03-26 DIAGNOSIS — J45909 Unspecified asthma, uncomplicated: Secondary | ICD-10-CM | POA: Diagnosis not present

## 2018-03-26 DIAGNOSIS — K5900 Constipation, unspecified: Secondary | ICD-10-CM | POA: Diagnosis not present

## 2018-03-26 DIAGNOSIS — E119 Type 2 diabetes mellitus without complications: Secondary | ICD-10-CM | POA: Diagnosis not present

## 2018-03-26 DIAGNOSIS — M545 Low back pain: Secondary | ICD-10-CM | POA: Diagnosis not present

## 2018-03-28 DIAGNOSIS — Z01818 Encounter for other preprocedural examination: Secondary | ICD-10-CM | POA: Diagnosis not present

## 2018-03-28 DIAGNOSIS — G8929 Other chronic pain: Secondary | ICD-10-CM | POA: Diagnosis not present

## 2018-03-29 DIAGNOSIS — J060 Acute laryngopharyngitis: Secondary | ICD-10-CM | POA: Diagnosis not present

## 2018-03-29 DIAGNOSIS — J019 Acute sinusitis, unspecified: Secondary | ICD-10-CM | POA: Diagnosis not present

## 2018-03-29 DIAGNOSIS — J45909 Unspecified asthma, uncomplicated: Secondary | ICD-10-CM | POA: Diagnosis not present

## 2018-03-31 DIAGNOSIS — M2041 Other hammer toe(s) (acquired), right foot: Secondary | ICD-10-CM | POA: Diagnosis not present

## 2018-03-31 DIAGNOSIS — M205X1 Other deformities of toe(s) (acquired), right foot: Secondary | ICD-10-CM | POA: Diagnosis not present

## 2018-04-04 DIAGNOSIS — M2041 Other hammer toe(s) (acquired), right foot: Secondary | ICD-10-CM | POA: Diagnosis not present

## 2018-04-07 DIAGNOSIS — E119 Type 2 diabetes mellitus without complications: Secondary | ICD-10-CM | POA: Diagnosis not present

## 2018-04-07 DIAGNOSIS — J45909 Unspecified asthma, uncomplicated: Secondary | ICD-10-CM | POA: Diagnosis not present

## 2018-04-07 DIAGNOSIS — E782 Mixed hyperlipidemia: Secondary | ICD-10-CM | POA: Diagnosis not present

## 2018-04-07 DIAGNOSIS — R7301 Impaired fasting glucose: Secondary | ICD-10-CM | POA: Diagnosis not present

## 2018-04-07 DIAGNOSIS — E1140 Type 2 diabetes mellitus with diabetic neuropathy, unspecified: Secondary | ICD-10-CM | POA: Diagnosis not present

## 2018-04-07 DIAGNOSIS — K219 Gastro-esophageal reflux disease without esophagitis: Secondary | ICD-10-CM | POA: Diagnosis not present

## 2018-04-07 DIAGNOSIS — E1121 Type 2 diabetes mellitus with diabetic nephropathy: Secondary | ICD-10-CM | POA: Diagnosis not present

## 2018-04-23 DIAGNOSIS — F331 Major depressive disorder, recurrent, moderate: Secondary | ICD-10-CM | POA: Diagnosis not present

## 2018-04-23 DIAGNOSIS — M545 Low back pain: Secondary | ICD-10-CM | POA: Diagnosis not present

## 2018-04-23 DIAGNOSIS — R5383 Other fatigue: Secondary | ICD-10-CM | POA: Diagnosis not present

## 2018-05-09 DIAGNOSIS — K219 Gastro-esophageal reflux disease without esophagitis: Secondary | ICD-10-CM | POA: Diagnosis not present

## 2018-05-09 DIAGNOSIS — R7301 Impaired fasting glucose: Secondary | ICD-10-CM | POA: Diagnosis not present

## 2018-05-09 DIAGNOSIS — E119 Type 2 diabetes mellitus without complications: Secondary | ICD-10-CM | POA: Diagnosis not present

## 2018-05-09 DIAGNOSIS — E1121 Type 2 diabetes mellitus with diabetic nephropathy: Secondary | ICD-10-CM | POA: Diagnosis not present

## 2018-05-09 DIAGNOSIS — E782 Mixed hyperlipidemia: Secondary | ICD-10-CM | POA: Diagnosis not present

## 2018-05-09 DIAGNOSIS — J45909 Unspecified asthma, uncomplicated: Secondary | ICD-10-CM | POA: Diagnosis not present

## 2018-05-09 DIAGNOSIS — E1140 Type 2 diabetes mellitus with diabetic neuropathy, unspecified: Secondary | ICD-10-CM | POA: Diagnosis not present

## 2018-06-06 DIAGNOSIS — E1121 Type 2 diabetes mellitus with diabetic nephropathy: Secondary | ICD-10-CM | POA: Diagnosis not present

## 2018-06-06 DIAGNOSIS — E782 Mixed hyperlipidemia: Secondary | ICD-10-CM | POA: Diagnosis not present

## 2018-06-06 DIAGNOSIS — E119 Type 2 diabetes mellitus without complications: Secondary | ICD-10-CM | POA: Diagnosis not present

## 2018-06-06 DIAGNOSIS — J45909 Unspecified asthma, uncomplicated: Secondary | ICD-10-CM | POA: Diagnosis not present

## 2018-06-16 ENCOUNTER — Ambulatory Visit: Payer: Self-pay | Admitting: Physician Assistant

## 2018-06-26 DIAGNOSIS — R194 Change in bowel habit: Secondary | ICD-10-CM | POA: Diagnosis not present

## 2018-06-26 DIAGNOSIS — Z1211 Encounter for screening for malignant neoplasm of colon: Secondary | ICD-10-CM | POA: Diagnosis not present

## 2018-06-26 DIAGNOSIS — K5730 Diverticulosis of large intestine without perforation or abscess without bleeding: Secondary | ICD-10-CM | POA: Diagnosis not present

## 2018-06-26 DIAGNOSIS — Z86010 Personal history of colonic polyps: Secondary | ICD-10-CM | POA: Diagnosis not present

## 2018-07-01 DIAGNOSIS — E1140 Type 2 diabetes mellitus with diabetic neuropathy, unspecified: Secondary | ICD-10-CM | POA: Diagnosis not present

## 2018-07-01 DIAGNOSIS — E119 Type 2 diabetes mellitus without complications: Secondary | ICD-10-CM | POA: Diagnosis not present

## 2018-07-01 DIAGNOSIS — E1121 Type 2 diabetes mellitus with diabetic nephropathy: Secondary | ICD-10-CM | POA: Diagnosis not present

## 2018-07-01 DIAGNOSIS — E782 Mixed hyperlipidemia: Secondary | ICD-10-CM | POA: Diagnosis not present

## 2018-07-01 DIAGNOSIS — R7301 Impaired fasting glucose: Secondary | ICD-10-CM | POA: Diagnosis not present

## 2018-07-03 NOTE — Progress Notes (Signed)
Office Visit Note  Patient: Stephanie Norris             Date of Birth: 10-17-1955           MRN: [id number]             PCP: Celene Squibb, MD Referring: Celene Squibb, MD Visit Date: 07/17/2018 Occupation: '@GUAROCC'$ @  Subjective:  Trochanteric bursitis    History of Present Illness: Stephanie Norris is a 63 y.o. female with history of seropositive rheumatoid arthritis, osteoarthritis, fibromyalgia, and DDD.  She is taking methotrexate 6 tablets by mouth once weekly and folic acid 2 mg by mouth daily.  She takes Cymbalta 60 mg 1 capsule by mouth daily.  She denies any recent rheumatoid arthritis or fibromyalgia flares.  She continues to have intermittent discomfort and swelling in bilateral hands.  She states that her neck pain has improved significantly.  She states that her PCP started her on Wellbutrin which is helped with her muscle tension and muscle tenderness.  She continues have trochanter bursitis bilaterally.  She has intermittent lower back pain but no symptoms of radiculopathy at this time.  She denies any other joint pain or joint swelling at this time.  She states that the mucin cyst on her left index finger has returned but states it is not tender.    Activities of Daily Living:  Patient reports morning stiffness for 0 none.   Patient Denies nocturnal pain.  Difficulty dressing/grooming: Denies Difficulty climbing stairs: Reports Difficulty getting out of chair: Denies Difficulty using hands for taps, buttons, cutlery, and/or writing: Reports  Review of Systems  Constitutional: Positive for fatigue.  HENT: Positive for mouth dryness.   Eyes: Negative for dryness.  Respiratory: Negative for shortness of breath.   Cardiovascular: Negative for swelling in legs/feet.  Gastrointestinal: Positive for constipation.  Endocrine: Positive for excessive thirst.  Genitourinary: Negative for painful urination.  Musculoskeletal: Positive for arthralgias, gait problem, joint pain and  joint swelling.  Skin: Negative for rash.  Allergic/Immunologic: Negative for susceptible to infections.  Neurological: Negative for weakness.  Hematological: Negative for bruising/bleeding tendency.  Psychiatric/Behavioral: Negative for sleep disturbance.    PMFS History:  Patient Active Problem List   Diagnosis Date Noted   Vertigo 08/20/2016   Eustachian tube dysfunction 04/17/2016   Abdominal pain 03/02/2016   Gastroenteritis 03/02/2016   Rheumatoid arthritis with rheumatoid factor of multiple sites without organ or systems involvement ([place]) 01/13/2016   HLA B27 (HLA B27 positive) 01/13/2016   Osteoarthritis of foot 01/13/2016   DJD (degenerative joint disease), cervical 01/13/2016   Spondylosis of lumbar region without myelopathy or radiculopathy 01/13/2016   Primary osteoarthritis of both hands 01/13/2016   Non-seasonal allergic rhinitis 08/13/2014   Stiffness of joints, not elsewhere classified, multiple sites 08/26/2013   Flu-like symptoms 03/02/2013   Lactic acidosis 03/02/2013   Sinus tachycardia 03/02/2013   Elevated lactic acid level 03/02/2013   Influenza due to identified novel influenza A virus with other respiratory manifestations 03/02/2013   Hyperglycemia 03/02/2013   Fibromyalgia    OSA (obstructive sleep apnea) 06/22/2011   DOE (dyspnea on exertion) 05/30/2011   Moderate persistent asthma 03/30/2011    Past Medical History:  Diagnosis Date   Asthma    COPD (chronic obstructive pulmonary disease) ([place])    Diabetes mellitus without complication (HCC)    Type 2   Dizziness    Fibromyalgia    GERD (gastroesophageal reflux disease)    Headache  Hyperlipidemia    OSA (obstructive sleep apnea)    Osteoarthritis    PCOS (polycystic ovarian syndrome)    Rheumatoid arthritis ([place])     Family History  Problem Relation Age of Onset   Asthma Mother    Rheum arthritis Mother    Allergic rhinitis Mother    Rheum  arthritis Sister    Allergic rhinitis Sister    Heart failure Father    Heart disease Father    Rheum arthritis Sister    Allergic rhinitis Sister    Allergic rhinitis Brother    Allergies Other        "Everyone"   Heart disease Maternal Grandfather    Heart disease Maternal Grandmother    Heart disease Paternal Grandfather    Heart disease Paternal Grandmother    Past Surgical History:  Procedure Laterality Date   APPENDECTOMY     CHOLECYSTECTOMY     COLONOSCOPY W/ BIOPSIES  fall 2009   COMBINED HYSTERECTOMY VAGINAL / OOPHORECTOMY / A&P REPAIR  10/2002   endometriosis, cystocele, fibroids   KNEE SURGERY  1996   TOE SURGERY     VESICOVAGINAL FISTULA CLOSURE W/ TAH     Social History   Social History Narrative   Lives with husband   Retired    no children   Assoc degree   16 oz caffeine daily   Immunization History  Administered Date(s) Administered   Influenza Whole 10/07/2010, 11/06/2011   Influenza,inj,Quad PF,6+ Mos 11/05/2012, 11/02/2015, 11/22/2016, 11/15/2017   Influenza-Unspecified 11/05/2013   Pneumococcal Conjugate-13 11/06/2014   Pneumococcal Polysaccharide-23 11/02/2015     Objective: Vital Signs: BP 125/78 (BP Location: Right Arm, Patient Position: Sitting, Cuff Size: Normal)    Pulse 87    Resp 16    Ht 4' 11.5" (1.511 m)    Wt 119 lb 8 oz (54.2 kg)    BMI 23.73 kg/m    Physical Exam Vitals signs and nursing note reviewed.  Constitutional:      Appearance: She is well-developed.  HENT:     Head: Normocephalic and atraumatic.  Eyes:     Conjunctiva/sclera: Conjunctivae normal.  Neck:     Musculoskeletal: Normal range of motion.  Cardiovascular:     Rate and Rhythm: Normal rate and regular rhythm.     Heart sounds: Normal heart sounds.  Pulmonary:     Effort: Pulmonary effort is normal.     Breath sounds: Normal breath sounds.  Abdominal:     General: Bowel sounds are normal.     Palpations: Abdomen is soft.    Lymphadenopathy:     Cervical: No cervical adenopathy.  Skin:    General: Skin is warm and dry.     Capillary Refill: Capillary refill takes less than 2 seconds.  Neurological:     Mental Status: She is alert and oriented to person, place, and time.  Psychiatric:        Behavior: Behavior normal.      Musculoskeletal Exam: C-spine, thoracic spine, lumbar spine good range of motion.  No midline spinal tenderness.  No SI joint tenderness.  Shoulder joints, elbow joints, wrist joints, MCPs, PIPs, DIPs good range of motion with no synovitis.  She has complete fist formation bilaterally.  DIP synovial thickening consistent with osteoarthritis of bilateral hands.  She has a mucin cyst present on the left index finger.  Hip joints, knee joints, ankle joints, MTPs, PIPs, DIPs good range of motion with no synovitis.  No warmth or effusion of bilateral  knee joints.  Left knee crepitus palpated.  No tenderness or swelling of ankle joints.  She has PIP and DIP synovial thickening consistent with osteoarthritis of bilateral feet.  She has tenderness over bilateral trochanter bursa  CDAI Exam: CDAI Score: 0.6  Patient Global: 3 mm; Provider Global: 3 mm Swollen: 0 ; Tender: 0  Joint Exam   No joint exam has been documented for this visit   There is currently no information documented on the homunculus. Go to the Rheumatology activity and complete the homunculus joint exam.  Investigation: No additional findings.  Imaging: No results found.  Recent Labs: Lab Results  Component Value Date   WBC 9.2 06/06/2017   HGB 12.4 06/06/2017   PLT 439 (H) 06/06/2017   NA 136 06/06/2017   K 4.8 06/06/2017   CL 102 06/06/2017   CO2 24 06/06/2017   GLUCOSE 248 (H) 06/06/2017   BUN 21 06/06/2017   CREATININE 0.93 06/06/2017   BILITOT 0.3 06/06/2017   ALKPHOS 54 09/10/2016   AST 18 06/06/2017   ALT 21 06/06/2017   PROT 6.5 03/05/2018   ALBUMIN 4.0 09/10/2016   CALCIUM 9.8 06/06/2017   GFRAA 75  06/06/2017    Speciality Comments: No specialty comments available.  Procedures:  No procedures performed Allergies: Nsaids, Red dye, Bactrim [sulfamethoxazole-trimethoprim], Cephalexin, Levofloxacin, and Nitrofurantoin monohyd macro   Assessment / Plan:     Visit Diagnoses: Rheumatoid arthritis with rheumatoid factor of multiple sites without organ or systems involvement ([place]) - +CCP: She has no synovitis on exam.  She has not had any recent rheumatoid arthritis flares.  She has intermittent discomfort in bilateral hands but has no tenderness or synovitis today.  She has complete fist formation bilaterally.  She is clinically doing well on methotrexate 6 tablets by mouth once weekly and folic acid 2 mg by mouth daily.  A refill of methotrexate was sent to the pharmacy today.  She will continue on his current treatment regimen.  She was advised to notify us if she develops increased joint pain or joint swelling.  She will follow-up in the office in 5 months.  HLA B27 (HLA B27 positive)   High risk medication use - She is on methotrexate 6 tablets every 7 days and folic acid 1 mg 2 tablets daily.  Last CBC/CMP within normal limits except for elevated WBC and neutrophils on 07/01/2018 from her PCP.  Will monitor every 3 months and standing orders are in place.  She received her flu vaccine in October and previously Prevnar 13 and Pneumovax 23.    Primary osteoarthritis of both hands -she has DIP synovial thickening consistent with osteoarthritis of bilateral hands.  She has a mucin cyst present on the left index finger.  She has no tenderness or synovitis.  She has complete fist formation bilaterally.  Joint protection and muscle strengthening were discussed  Primary osteoarthritis of both feet -She has PIP and DIP synovial thickening consistent with osteoarthritis of bilateral feet.  She has no discomfort in her feet at this time.  She was proper fitting shoes.  DDD (degenerative disc disease),  cervical -She has good range of motion with no discomfort.  She has no symptoms of radiculopathy at this time.  Her trapezius muscle tension and muscle tenderness has improved since starting on Wellbutrin.  DDD (degenerative disc disease), lumbar -she has intermittent lower back pain.  She has good range of motion at this time.  No midline spinal tenderness.  Fibromyalgia -she continues have  generalized muscle aches muscle tenderness due to fibromyalgia.  Overall her fibromyalgia has been well controlled.  She continues to have trochanter bursitis bilaterally.  She is encouraged to perform stretching exercises on a regular basis.  She was given a handout of exercises to perform.  She was encouraged to stay active and exercise on a regular basis.  She continues to have chronic fatigue but it has been stable recently.  Trochanteric bursitis of both hips -She has tenderness over bilateral trochanteric bursa.  She was encouraged to perform stretching exercises on a regular basis.  She was given a handout of exercises to perform.  Other fatigue - Chronic but stable.   Other medical conditions are listed as follows:   History of asthma   History of sleep apnea   History of diabetes mellitus   History of depression   Orders: No orders of the defined types were placed in this encounter.  Meds ordered this encounter  Medications   methotrexate (RHEUMATREX) 2.5 MG tablet    Sig: Take 6 tablets by mouth once weekly. Caution:Chemotherapy. Protect from light.    Dispense:  72 tablet    Refill:  0     Follow-Up Instructions: Return in about 5 months (around 12/17/2018) for Rheumatoid arthritis, Fibromyalgia.   Ofilia Neas, PA-C  Note - This record has been created using Dragon software.  Chart creation errors have been sought, but may not always  have been located. Such creation errors do not reflect on  the standard of medical care.

## 2018-07-04 DIAGNOSIS — E1142 Type 2 diabetes mellitus with diabetic polyneuropathy: Secondary | ICD-10-CM | POA: Diagnosis not present

## 2018-07-04 DIAGNOSIS — K219 Gastro-esophageal reflux disease without esophagitis: Secondary | ICD-10-CM | POA: Diagnosis not present

## 2018-07-04 DIAGNOSIS — J302 Other seasonal allergic rhinitis: Secondary | ICD-10-CM | POA: Diagnosis not present

## 2018-07-04 DIAGNOSIS — E782 Mixed hyperlipidemia: Secondary | ICD-10-CM | POA: Diagnosis not present

## 2018-07-04 DIAGNOSIS — Z0001 Encounter for general adult medical examination with abnormal findings: Secondary | ICD-10-CM | POA: Diagnosis not present

## 2018-07-04 DIAGNOSIS — G9009 Other idiopathic peripheral autonomic neuropathy: Secondary | ICD-10-CM | POA: Diagnosis not present

## 2018-07-09 ENCOUNTER — Telehealth: Payer: Self-pay

## 2018-07-09 DIAGNOSIS — E782 Mixed hyperlipidemia: Secondary | ICD-10-CM | POA: Diagnosis not present

## 2018-07-09 DIAGNOSIS — K219 Gastro-esophageal reflux disease without esophagitis: Secondary | ICD-10-CM | POA: Diagnosis not present

## 2018-07-09 DIAGNOSIS — J45909 Unspecified asthma, uncomplicated: Secondary | ICD-10-CM | POA: Diagnosis not present

## 2018-07-09 DIAGNOSIS — E119 Type 2 diabetes mellitus without complications: Secondary | ICD-10-CM | POA: Diagnosis not present

## 2018-07-09 DIAGNOSIS — E1140 Type 2 diabetes mellitus with diabetic neuropathy, unspecified: Secondary | ICD-10-CM | POA: Diagnosis not present

## 2018-07-09 DIAGNOSIS — E1121 Type 2 diabetes mellitus with diabetic nephropathy: Secondary | ICD-10-CM | POA: Diagnosis not present

## 2018-07-09 DIAGNOSIS — G720 Drug-induced myopathy: Secondary | ICD-10-CM | POA: Diagnosis not present

## 2018-07-09 DIAGNOSIS — R7301 Impaired fasting glucose: Secondary | ICD-10-CM | POA: Diagnosis not present

## 2018-07-09 NOTE — Telephone Encounter (Signed)
Last Visit: 01/13/18 Next Visit: 07/17/18  Labs: 01/13/18 Absolute Lymph 3.6, BUN Creat. Ratio 29

## 2018-07-09 NOTE — Telephone Encounter (Signed)
Patient is requesting a refill of methotrexate (RHEUMATREX) 2.5 MG tablet [facility]

## 2018-07-10 DIAGNOSIS — Z1211 Encounter for screening for malignant neoplasm of colon: Secondary | ICD-10-CM | POA: Diagnosis not present

## 2018-07-10 DIAGNOSIS — K5904 Chronic idiopathic constipation: Secondary | ICD-10-CM | POA: Diagnosis not present

## 2018-07-10 DIAGNOSIS — Z86010 Personal history of colonic polyps: Secondary | ICD-10-CM | POA: Diagnosis not present

## 2018-07-10 DIAGNOSIS — K219 Gastro-esophageal reflux disease without esophagitis: Secondary | ICD-10-CM | POA: Diagnosis not present

## 2018-07-10 DIAGNOSIS — K5730 Diverticulosis of large intestine without perforation or abscess without bleeding: Secondary | ICD-10-CM | POA: Diagnosis not present

## 2018-07-10 NOTE — Telephone Encounter (Signed)
Attempted to contact the patient and left message for patient to call the office. Patient needs labs prior to refill. Patient was to have labs sent from another physician's office. Have not received the results at this time

## 2018-07-17 ENCOUNTER — Encounter: Payer: Self-pay | Admitting: Physician Assistant

## 2018-07-17 ENCOUNTER — Other Ambulatory Visit: Payer: Self-pay

## 2018-07-17 ENCOUNTER — Ambulatory Visit: Payer: PPO | Admitting: Physician Assistant

## 2018-07-17 VITALS — BP 125/78 | HR 87 | Resp 16 | Ht 59.5 in | Wt 119.5 lb

## 2018-07-17 DIAGNOSIS — Z8669 Personal history of other diseases of the nervous system and sense organs: Secondary | ICD-10-CM

## 2018-07-17 DIAGNOSIS — M19071 Primary osteoarthritis, right ankle and foot: Secondary | ICD-10-CM | POA: Diagnosis not present

## 2018-07-17 DIAGNOSIS — M5030 Other cervical disc degeneration, unspecified cervical region: Secondary | ICD-10-CM | POA: Diagnosis not present

## 2018-07-17 DIAGNOSIS — M19041 Primary osteoarthritis, right hand: Secondary | ICD-10-CM | POA: Diagnosis not present

## 2018-07-17 DIAGNOSIS — M797 Fibromyalgia: Secondary | ICD-10-CM | POA: Diagnosis not present

## 2018-07-17 DIAGNOSIS — Z8709 Personal history of other diseases of the respiratory system: Secondary | ICD-10-CM | POA: Diagnosis not present

## 2018-07-17 DIAGNOSIS — R5383 Other fatigue: Secondary | ICD-10-CM | POA: Diagnosis not present

## 2018-07-17 DIAGNOSIS — Z79899 Other long term (current) drug therapy: Secondary | ICD-10-CM | POA: Diagnosis not present

## 2018-07-17 DIAGNOSIS — M19042 Primary osteoarthritis, left hand: Secondary | ICD-10-CM

## 2018-07-17 DIAGNOSIS — M0579 Rheumatoid arthritis with rheumatoid factor of multiple sites without organ or systems involvement: Secondary | ICD-10-CM

## 2018-07-17 DIAGNOSIS — Z8639 Personal history of other endocrine, nutritional and metabolic disease: Secondary | ICD-10-CM

## 2018-07-17 DIAGNOSIS — M7061 Trochanteric bursitis, right hip: Secondary | ICD-10-CM

## 2018-07-17 DIAGNOSIS — Z8659 Personal history of other mental and behavioral disorders: Secondary | ICD-10-CM

## 2018-07-17 DIAGNOSIS — M19072 Primary osteoarthritis, left ankle and foot: Secondary | ICD-10-CM

## 2018-07-17 DIAGNOSIS — Z1589 Genetic susceptibility to other disease: Secondary | ICD-10-CM | POA: Diagnosis not present

## 2018-07-17 DIAGNOSIS — M7062 Trochanteric bursitis, left hip: Secondary | ICD-10-CM

## 2018-07-17 DIAGNOSIS — M5136 Other intervertebral disc degeneration, lumbar region: Secondary | ICD-10-CM | POA: Diagnosis not present

## 2018-07-17 MED ORDER — METHOTREXATE 2.5 MG PO TABS: ORAL_TABLET | ORAL | 0 refills | Status: DC

## 2018-07-17 NOTE — Patient Instructions (Addendum)
Standing Labs We placed an order today for your standing lab work.    Please come back and get your standing labs in August and every 3 months   We have open lab daily Monday through Thursday from 8:30-12:30 PM and 1:30-4:30 PM and Friday from 8:30-12:30 PM and 1:30 -4:00 PM at the office of Dr. Bo Merino.   You may experience shorter wait times on Monday and Friday afternoons. The office is located at [address], [place], [place], [place] [address] No appointment is necessary.   Labs are drawn by [facility].  You may receive a bill from [facility] for your lab work.  If you wish to have your labs drawn at another location, please call the office 24 hours in advance to send orders.  If you have any questions regarding directions or hours of operation,  please call [phone].   Just as a reminder please drink plenty of water prior to coming for your lab work. Thanks!   Trochanteric Bursitis Rehab Ask your health care provider which exercises are safe for you. Do exercises exactly as told by your health care provider and adjust them as directed. It is normal to feel mild stretching, pulling, tightness, or discomfort as you do these exercises, but you should stop right away if you feel sudden pain or your pain gets worse.Do not begin these exercises until told by your health care provider. Stretching exercises These exercises warm up your muscles and joints and improve the movement and flexibility of your hip. These exercises also help to relieve pain and stiffness. Exercise A: Iliotibial band stretch  1. Lie on your side with your left / right leg in the top position. 2. Bend your left / right knee and grab your ankle. 3. Slowly bring your knee back so your thigh is behind your body. 4. Slowly lower your knee toward the floor until you feel a gentle stretch on the outside of your left / right thigh. If you do not feel a stretch and your knee will not fall farther, place the heel  of your other foot on top of your outer knee and pull your thigh down farther. 5. Hold this position for __________ seconds. 6. Slowly return to the starting position. Repeat __________ times. Complete this exercise __________ times a day. Strengthening exercises These exercises build strength and endurance in your hip and pelvis. Endurance is the ability to use your muscles for a long time, even after they get tired. Exercise B: Bridge (hip extensors)  1. Lie on your back on a firm surface with your knees bent and your feet flat on the floor. 2. Tighten your buttocks muscles and lift your buttocks off the floor until your trunk is level with your thighs. You should feel the muscles working in your buttocks and the back of your thighs. If this exercise is too easy, try doing it with your arms crossed over your chest. 3. Hold this position for __________ seconds. 4. Slowly return to the starting position. 5. Let your muscles relax completely between repetitions. Repeat __________ times. Complete this exercise __________ times a day. Exercise C: Squats (knee extensors and  quadriceps) 1. Stand in front of a table, with your feet and knees pointing straight ahead. You may rest your hands on the table for balance but not for support. 2. Slowly bend your knees and lower your hips like you are going to sit in a chair. ? Keep your weight over your heels, not over your  toes. ? Keep your lower legs upright so they are parallel with the table legs. ? Do not let your hips go lower than your knees. ? Do not bend lower than told by your health care provider. ? If your hip pain increases, do not bend as low. 3. Hold this position for __________ seconds. 4. Slowly push with your legs to return to standing. Do not use your hands to pull yourself to standing. Repeat __________ times. Complete this exercise __________ times a day. Exercise D: Hip hike 1. Stand sideways on a bottom step. Stand on your left /  right leg with your other foot unsupported next to the step. You can hold onto the railing or wall if needed for balance. 2. Keeping your knees straight and your torso square, lift your left / right hip up toward the ceiling. 3. Hold this position for __________ seconds. 4. Slowly let your left / right hip lower toward the floor, past the starting position. Your foot should get closer to the floor. Do not lean or bend your knees. Repeat __________ times. Complete this exercise __________ times a day. Exercise E: Single leg stand 1. Stand near a counter or door frame that you can hold onto for balance as needed. It is helpful to stand in front of a mirror for this exercise so you can watch your hip. 2. Squeeze your left / right buttock muscles then lift up your other foot. Do not let your left / right hip push out to the side. 3. Hold this position for __________ seconds. Repeat __________ times. Complete this exercise __________ times a day. This information is not intended to replace advice given to you by your health care provider. Make sure you discuss any questions you have with your health care provider. Document Released: 03/01/2004 Document Revised: 09/29/2015 Document Reviewed: 01/07/2015 Elsevier Interactive Patient Education  2019 [employer].

## 2018-07-28 DIAGNOSIS — R194 Change in bowel habit: Secondary | ICD-10-CM | POA: Diagnosis not present

## 2018-07-28 DIAGNOSIS — Z1211 Encounter for screening for malignant neoplasm of colon: Secondary | ICD-10-CM | POA: Diagnosis not present

## 2018-08-12 DIAGNOSIS — G720 Drug-induced myopathy: Secondary | ICD-10-CM | POA: Diagnosis not present

## 2018-08-12 DIAGNOSIS — J45909 Unspecified asthma, uncomplicated: Secondary | ICD-10-CM | POA: Diagnosis not present

## 2018-08-12 DIAGNOSIS — K219 Gastro-esophageal reflux disease without esophagitis: Secondary | ICD-10-CM | POA: Diagnosis not present

## 2018-08-12 DIAGNOSIS — E1121 Type 2 diabetes mellitus with diabetic nephropathy: Secondary | ICD-10-CM | POA: Diagnosis not present

## 2018-08-12 DIAGNOSIS — E782 Mixed hyperlipidemia: Secondary | ICD-10-CM | POA: Diagnosis not present

## 2018-08-12 DIAGNOSIS — R7301 Impaired fasting glucose: Secondary | ICD-10-CM | POA: Diagnosis not present

## 2018-08-12 DIAGNOSIS — E1140 Type 2 diabetes mellitus with diabetic neuropathy, unspecified: Secondary | ICD-10-CM | POA: Diagnosis not present

## 2018-08-12 DIAGNOSIS — E119 Type 2 diabetes mellitus without complications: Secondary | ICD-10-CM | POA: Diagnosis not present

## 2018-08-18 DIAGNOSIS — Z6824 Body mass index (BMI) 24.0-24.9, adult: Secondary | ICD-10-CM | POA: Diagnosis not present

## 2018-08-18 DIAGNOSIS — E119 Type 2 diabetes mellitus without complications: Secondary | ICD-10-CM | POA: Diagnosis not present

## 2018-08-18 DIAGNOSIS — J309 Allergic rhinitis, unspecified: Secondary | ICD-10-CM | POA: Diagnosis not present

## 2018-08-18 DIAGNOSIS — G9009 Other idiopathic peripheral autonomic neuropathy: Secondary | ICD-10-CM | POA: Diagnosis not present

## 2018-08-18 DIAGNOSIS — Z0000 Encounter for general adult medical examination without abnormal findings: Secondary | ICD-10-CM | POA: Diagnosis not present

## 2018-08-18 DIAGNOSIS — K219 Gastro-esophageal reflux disease without esophagitis: Secondary | ICD-10-CM | POA: Diagnosis not present

## 2018-08-18 DIAGNOSIS — J45909 Unspecified asthma, uncomplicated: Secondary | ICD-10-CM | POA: Diagnosis not present

## 2018-08-18 DIAGNOSIS — E1142 Type 2 diabetes mellitus with diabetic polyneuropathy: Secondary | ICD-10-CM | POA: Diagnosis not present

## 2018-08-18 DIAGNOSIS — N3001 Acute cystitis with hematuria: Secondary | ICD-10-CM | POA: Diagnosis not present

## 2018-08-18 DIAGNOSIS — E782 Mixed hyperlipidemia: Secondary | ICD-10-CM | POA: Diagnosis not present

## 2018-08-18 DIAGNOSIS — F331 Major depressive disorder, recurrent, moderate: Secondary | ICD-10-CM | POA: Diagnosis not present

## 2018-08-18 DIAGNOSIS — M21371 Foot drop, right foot: Secondary | ICD-10-CM | POA: Diagnosis not present

## 2018-08-18 DIAGNOSIS — R5383 Other fatigue: Secondary | ICD-10-CM | POA: Diagnosis not present

## 2018-08-21 DIAGNOSIS — M7130 Other bursal cyst, unspecified site: Secondary | ICD-10-CM | POA: Diagnosis not present

## 2018-08-21 DIAGNOSIS — L720 Epidermal cyst: Secondary | ICD-10-CM | POA: Diagnosis not present

## 2018-08-29 DIAGNOSIS — K635 Polyp of colon: Secondary | ICD-10-CM | POA: Diagnosis not present

## 2018-08-29 DIAGNOSIS — D123 Benign neoplasm of transverse colon: Secondary | ICD-10-CM | POA: Diagnosis not present

## 2018-08-29 DIAGNOSIS — K5730 Diverticulosis of large intestine without perforation or abscess without bleeding: Secondary | ICD-10-CM | POA: Diagnosis not present

## 2018-08-29 DIAGNOSIS — Z86010 Personal history of colonic polyps: Secondary | ICD-10-CM | POA: Diagnosis not present

## 2018-08-29 DIAGNOSIS — Z1211 Encounter for screening for malignant neoplasm of colon: Secondary | ICD-10-CM | POA: Diagnosis not present

## 2018-09-03 ENCOUNTER — Ambulatory Visit: Payer: PPO | Admitting: Diagnostic Neuroimaging

## 2018-09-04 DIAGNOSIS — L729 Follicular cyst of the skin and subcutaneous tissue, unspecified: Secondary | ICD-10-CM | POA: Diagnosis not present

## 2018-09-04 DIAGNOSIS — M7130 Other bursal cyst, unspecified site: Secondary | ICD-10-CM | POA: Diagnosis not present

## 2018-09-22 DIAGNOSIS — M79645 Pain in left finger(s): Secondary | ICD-10-CM | POA: Diagnosis not present

## 2018-09-29 DIAGNOSIS — E1140 Type 2 diabetes mellitus with diabetic neuropathy, unspecified: Secondary | ICD-10-CM | POA: Diagnosis not present

## 2018-09-29 DIAGNOSIS — E782 Mixed hyperlipidemia: Secondary | ICD-10-CM | POA: Diagnosis not present

## 2018-09-29 DIAGNOSIS — J45909 Unspecified asthma, uncomplicated: Secondary | ICD-10-CM | POA: Diagnosis not present

## 2018-09-29 DIAGNOSIS — G720 Drug-induced myopathy: Secondary | ICD-10-CM | POA: Diagnosis not present

## 2018-09-29 DIAGNOSIS — R7301 Impaired fasting glucose: Secondary | ICD-10-CM | POA: Diagnosis not present

## 2018-09-29 DIAGNOSIS — K219 Gastro-esophageal reflux disease without esophagitis: Secondary | ICD-10-CM | POA: Diagnosis not present

## 2018-09-29 DIAGNOSIS — E1121 Type 2 diabetes mellitus with diabetic nephropathy: Secondary | ICD-10-CM | POA: Diagnosis not present

## 2018-09-29 DIAGNOSIS — E119 Type 2 diabetes mellitus without complications: Secondary | ICD-10-CM | POA: Diagnosis not present

## 2018-10-08 DIAGNOSIS — G720 Drug-induced myopathy: Secondary | ICD-10-CM | POA: Diagnosis not present

## 2018-10-08 DIAGNOSIS — E119 Type 2 diabetes mellitus without complications: Secondary | ICD-10-CM | POA: Diagnosis not present

## 2018-10-08 DIAGNOSIS — K219 Gastro-esophageal reflux disease without esophagitis: Secondary | ICD-10-CM | POA: Diagnosis not present

## 2018-10-08 DIAGNOSIS — M79645 Pain in left finger(s): Secondary | ICD-10-CM | POA: Diagnosis not present

## 2018-10-08 DIAGNOSIS — J45909 Unspecified asthma, uncomplicated: Secondary | ICD-10-CM | POA: Diagnosis not present

## 2018-10-08 DIAGNOSIS — R7301 Impaired fasting glucose: Secondary | ICD-10-CM | POA: Diagnosis not present

## 2018-10-08 DIAGNOSIS — E782 Mixed hyperlipidemia: Secondary | ICD-10-CM | POA: Diagnosis not present

## 2018-10-08 DIAGNOSIS — E1140 Type 2 diabetes mellitus with diabetic neuropathy, unspecified: Secondary | ICD-10-CM | POA: Diagnosis not present

## 2018-10-08 DIAGNOSIS — E1121 Type 2 diabetes mellitus with diabetic nephropathy: Secondary | ICD-10-CM | POA: Diagnosis not present

## 2018-10-09 ENCOUNTER — Ambulatory Visit (INDEPENDENT_AMBULATORY_CARE_PROVIDER_SITE_OTHER): Payer: PPO

## 2018-10-09 ENCOUNTER — Encounter: Payer: Self-pay | Admitting: Orthopaedic Surgery

## 2018-10-09 ENCOUNTER — Ambulatory Visit (INDEPENDENT_AMBULATORY_CARE_PROVIDER_SITE_OTHER): Payer: PPO | Admitting: Orthopaedic Surgery

## 2018-10-09 DIAGNOSIS — M79645 Pain in left finger(s): Secondary | ICD-10-CM

## 2018-10-09 MED ORDER — METHYLPREDNISOLONE 4 MG PO TBPK: ORAL_TABLET | ORAL | 0 refills | Status: DC

## 2018-10-09 MED ORDER — MUPIROCIN 2 % EX OINT: 1.0000 "application " | TOPICAL_OINTMENT | Freq: Two times a day (BID) | CUTANEOUS | 0 refills | Status: DC

## 2018-10-09 NOTE — Progress Notes (Signed)
Office Visit Note   Patient: Stephanie Norris           Date of Birth: Feb 15, 1955           MRN: [id number] Visit Date: 10/09/2018              Requested by: Celene Squibb, MD [address] [place],  [place] [address] PCP: Celene Squibb, MD   Assessment & Plan: Visit Diagnoses:  1. Finger pain, left     Plan: Impression is inflammation and aggravation from the removal of mucoid cyst from her underlying degenerative changes to the DIP joint.  We will send in a steroid taper and have the patient start to apply mupirocin to the affected area twice daily.  She will follow-up with [place] in 1 month's time unless this healed sooner for which she will then follow-up.  If she has any worsening of symptoms she has been instructed to call us.  Follow-Up Instructions: Return in about 4 weeks (around 11/06/2018).   Orders:  Orders Placed This Encounter  Procedures   XR Finger Index Left   Meds ordered this encounter  Medications   methylPREDNISolone (MEDROL DOSEPAK) 4 MG TBPK tablet    Sig: Use as directed    Dispense:  21 tablet    Refill:  0   mupirocin ointment (BACTROBAN) 2 %    Sig: Apply 1 application topically 2 (two) times daily.    Dispense:  22 g    Refill:  0      Procedures: No procedures performed   Clinical Data: No additional findings.   Subjective: Chief Complaint  Patient presents with   Left Index Finger - Pain    HPI patient is a pleasant 63 year old female who presents our clinic today with concerns about her left index finger.  She noticed what she felt was a blister to the volar DIP joint about a year and a half ago.  She popped this on her own but then it returned.  This was quite bothersome and so she was seen by her dermatologist about 4 weeks ago where this removed.  Since then, she has had increased pain, swelling and erythema throughout the entire distal phalanx.  No drainage.  She has been on 2 different antibiotics with the last one being Bactrim  which she finished she thinks this past Monday.  She did not notice any difference in her symptoms during the use of the antibiotic.  She denies any fevers or chills.  Review of Systems as detailed in HPI.  All others reviewed and are negative.   Objective: Vital Signs: There were no vitals taken for this visit.  Physical Exam well-developed well-nourished female no acute distress.  Alert and oriented x3.  Ortho Exam examination of her left index finger reveals moderate swelling and erythema to the distal half.  Moderate tenderness throughout.  She does have a small circumferential scab over the DIP joint.  No drainage.  No PIP joint involvement.  Decreased sensation to the fingertip.  Specialty Comments:  No specialty comments available.  Imaging: Xr Finger Index Left  Result Date: 10/09/2018 X-rays demonstrate significant degenerative changes with marked spurring throughout the DIP joint    PMFS History: Patient Active Problem List   Diagnosis Date Noted   Vertigo 08/20/2016   Eustachian tube dysfunction 04/17/2016   Abdominal pain 03/02/2016   Gastroenteritis 03/02/2016   Rheumatoid arthritis with rheumatoid factor of multiple sites without organ or systems  HCC) 01/13/2016  °• HLA B27 (HLA B27 positive) 01/13/2016  °• Osteoarthritis of foot 01/13/2016  °• DJD (degenerative joint disease), cervical 01/13/2016  °• Spondylosis of lumbar region without myelopathy or radiculopathy 01/13/2016  °• Primary osteoarthritis of both hands 01/13/2016  °• Non-seasonal allergic rhinitis 08/13/2014  °• Stiffness of joints, not elsewhere classified, multiple sites 08/26/2013  °• Flu-like symptoms 03/02/2013  °• Lactic acidosis 03/02/2013  °• Sinus tachycardia 03/02/2013  °• Elevated lactic acid level 03/02/2013  °• Influenza due to identified novel influenza A virus with other respiratory manifestations 03/02/2013  °• Hyperglycemia 03/02/2013  °• Fibromyalgia   °• OSA (obstructive  sleep apnea) 06/22/2011  °• DOE (dyspnea on exertion) 05/30/2011  °• Moderate persistent asthma 03/30/2011  ° °Past Medical History:  °Diagnosis Date  °• Asthma   °• COPD (chronic obstructive pulmonary disease) (HCC)   °• Diabetes mellitus without complication (HCC)   ° Type 2  °• Dizziness   °• Fibromyalgia   °• GERD (gastroesophageal reflux disease)   °• Headache   °• Hyperlipidemia   °• OSA (obstructive sleep apnea)   °• Osteoarthritis   °• PCOS (polycystic ovarian syndrome)   °• Rheumatoid arthritis (HCC)   °  °Family History  °Problem Relation Age of Onset  °• Asthma Mother   °• Rheum arthritis Mother   °• Allergic rhinitis Mother   °• Rheum arthritis Sister   °• Allergic rhinitis Sister   °• Heart failure Father   °• Heart disease Father   °• Rheum arthritis Sister   °• Allergic rhinitis Sister   °• Allergic rhinitis Brother   °• Allergies Other   °     "Everyone"  °• Heart disease Maternal Grandfather   °• Heart disease Maternal Grandmother   °• Heart disease Paternal Grandfather   °• Heart disease Paternal Grandmother   °  °Past Surgical History:  °Procedure Laterality Date  °• APPENDECTOMY    °• CHOLECYSTECTOMY    °• COLONOSCOPY W/ BIOPSIES  fall 2009  °• COMBINED HYSTERECTOMY VAGINAL / OOPHORECTOMY / A&P REPAIR  10/2002  ° endometriosis, cystocele, fibroids  °• KNEE SURGERY  1996  °• TOE SURGERY    °• VESICOVAGINAL FISTULA CLOSURE W/ TAH    ° °Social History  ° °Occupational History  °• Occupation: Patient Account Rep  °  Employer: [employer]  °Tobacco Use  °• Smoking status: Never Smoker  °• Smokeless tobacco: Never Used  °Substance and Sexual Activity  °• Alcohol use: No  °• Drug use: No  °• Sexual activity: Not on file  ° ° ° ° ° °

## 2018-10-28 ENCOUNTER — Other Ambulatory Visit: Payer: Self-pay | Admitting: Physician Assistant

## 2018-10-29 NOTE — Telephone Encounter (Signed)
Last Visit: 07/17/18 Next Visit: 12/18/18 Labs: 07/01/18 WBC 11.4, Neutro Abs. 7.6, Glucose, BUN/Creat. Ratio 40, CO2 17  Left message to advise patient she is due to update labs.   Okay to refill 30 day supply per Dr. Estanislado Pandy

## 2018-11-06 ENCOUNTER — Ambulatory Visit: Payer: PPO | Admitting: Orthopaedic Surgery

## 2018-11-06 DIAGNOSIS — M79642 Pain in left hand: Secondary | ICD-10-CM | POA: Diagnosis not present

## 2018-11-06 DIAGNOSIS — J019 Acute sinusitis, unspecified: Secondary | ICD-10-CM | POA: Diagnosis not present

## 2018-11-10 DIAGNOSIS — N8182 Incompetence or weakening of pubocervical tissue: Secondary | ICD-10-CM | POA: Diagnosis not present

## 2018-11-10 DIAGNOSIS — K5909 Other constipation: Secondary | ICD-10-CM | POA: Diagnosis not present

## 2018-11-25 DIAGNOSIS — R2231 Localized swelling, mass and lump, right upper limb: Secondary | ICD-10-CM | POA: Diagnosis not present

## 2018-11-25 DIAGNOSIS — R05 Cough: Secondary | ICD-10-CM | POA: Diagnosis not present

## 2018-11-25 DIAGNOSIS — G4483 Primary cough headache: Secondary | ICD-10-CM | POA: Diagnosis not present

## 2018-11-25 DIAGNOSIS — R5381 Other malaise: Secondary | ICD-10-CM | POA: Diagnosis not present

## 2018-11-25 DIAGNOSIS — R0982 Postnasal drip: Secondary | ICD-10-CM | POA: Diagnosis not present

## 2018-11-26 DIAGNOSIS — G720 Drug-induced myopathy: Secondary | ICD-10-CM | POA: Diagnosis not present

## 2018-11-26 DIAGNOSIS — E119 Type 2 diabetes mellitus without complications: Secondary | ICD-10-CM | POA: Diagnosis not present

## 2018-11-26 DIAGNOSIS — E782 Mixed hyperlipidemia: Secondary | ICD-10-CM | POA: Diagnosis not present

## 2018-11-26 DIAGNOSIS — J45909 Unspecified asthma, uncomplicated: Secondary | ICD-10-CM | POA: Diagnosis not present

## 2018-11-27 ENCOUNTER — Other Ambulatory Visit: Payer: Self-pay | Admitting: Rheumatology

## 2018-11-28 NOTE — Telephone Encounter (Addendum)
Last Visit: 07/17/18 Next Visit: 12/18/18 Labs: 07/01/18 WBC 11.4, Neutro Abs. 7.6, Glucose, BUN/Creat. Ratio 40, CO2 17  Left message to advise patient she is due to update labs.   Okay to refill 30 day supply MTX?

## 2018-11-28 NOTE — Telephone Encounter (Signed)
Please advise patient to update lab work ASAP then we can send in a refill of MTX.

## 2018-12-04 DIAGNOSIS — J069 Acute upper respiratory infection, unspecified: Secondary | ICD-10-CM | POA: Diagnosis not present

## 2018-12-04 NOTE — Progress Notes (Deleted)
Office Visit Note  Patient: Stephanie Norris             Date of Birth: [birth year]           MRN: [id number]             PCP: Celene Squibb, MD Referring: Celene Squibb, MD Visit Date: 12/18/2018 Occupation: '@GUAROCC'$ @  Subjective:  No chief complaint on file.   History of Present Illness: Stephanie Norris is a 63 y.o. female ***   Activities of Daily Living:  Patient reports morning stiffness for *** {minute/hour:19697}.   Patient {ACTIONS;DENIES/REPORTS:21021675::"Denies"} nocturnal pain.  Difficulty dressing/grooming: {ACTIONS;DENIES/REPORTS:21021675::"Denies"} Difficulty climbing stairs: {ACTIONS;DENIES/REPORTS:21021675::"Denies"} Difficulty getting out of chair: {ACTIONS;DENIES/REPORTS:21021675::"Denies"} Difficulty using hands for taps, buttons, cutlery, and/or writing: {ACTIONS;DENIES/REPORTS:21021675::"Denies"}  No Rheumatology ROS completed.   PMFS History:  Patient Active Problem List   Diagnosis Date Noted  . Vertigo 08/20/2016  . Eustachian tube dysfunction 04/17/2016  . Abdominal pain 03/02/2016  . Gastroenteritis 03/02/2016  . Rheumatoid arthritis with rheumatoid factor of multiple sites without organ or systems involvement ([facility]) 01/13/2016  . HLA B27 (HLA B27 positive) 01/13/2016  . Osteoarthritis of foot 01/13/2016  . DJD (degenerative joint disease), cervical 01/13/2016  . Spondylosis of lumbar region without myelopathy or radiculopathy 01/13/2016  . Primary osteoarthritis of both hands 01/13/2016  . Non-seasonal allergic rhinitis 08/13/2014  . Stiffness of joints, not elsewhere classified, multiple sites 08/26/2013  . Flu-like symptoms 03/02/2013  . Lactic acidosis 03/02/2013  . Sinus tachycardia 03/02/2013  . Elevated lactic acid level 03/02/2013  . Influenza due to identified novel influenza A virus with other respiratory manifestations 03/02/2013  . Hyperglycemia 03/02/2013  . Fibromyalgia   . OSA (obstructive sleep apnea) 06/22/2011  . DOE (dyspnea  on exertion) 05/30/2011  . Moderate persistent asthma 03/30/2011    Past Medical History:  Diagnosis Date  . Asthma   . COPD (chronic obstructive pulmonary disease) ([place])   . Diabetes mellitus without complication (HCC)    Type 2  . Dizziness   . Fibromyalgia   . GERD (gastroesophageal reflux disease)   . Headache   . Hyperlipidemia   . OSA (obstructive sleep apnea)   . Osteoarthritis   . PCOS (polycystic ovarian syndrome)   . Rheumatoid arthritis ([facility])     Family History  Problem Relation Age of Onset  . Asthma Mother   . Rheum arthritis Mother   . Allergic rhinitis Mother   . Rheum arthritis Sister   . Allergic rhinitis Sister   . Heart failure Father   . Heart disease Father   . Rheum arthritis Sister   . Allergic rhinitis Sister   . Allergic rhinitis Brother   . Allergies Other        "Everyone"  . Heart disease Maternal Grandfather   . Heart disease Maternal Grandmother   . Heart disease Paternal Grandfather   . Heart disease Paternal Grandmother    Past Surgical History:  Procedure Laterality Date  . APPENDECTOMY    . CHOLECYSTECTOMY    . COLONOSCOPY W/ BIOPSIES  fall 2009  . COMBINED HYSTERECTOMY VAGINAL / OOPHORECTOMY / A&P REPAIR  10/2002   endometriosis, cystocele, fibroids  . KNEE SURGERY  1996  . TOE SURGERY    . VESICOVAGINAL FISTULA CLOSURE W/ TAH     Social History   Social History Narrative   Lives with husband   Retired    no children   Assoc degree   16 oz caffeine daily  Immunization History  Administered Date(s) Administered  . Influenza Whole 10/07/2010, 11/06/2011  . Influenza,inj,Quad PF,6+ Mos 11/05/2012, 11/02/2015, 11/22/2016, 11/15/2017  . Influenza-Unspecified 11/05/2013  . Pneumococcal Conjugate-13 11/06/2014  . Pneumococcal Polysaccharide-23 11/02/2015     Objective: Vital Signs: There were no vitals taken for this visit.   Physical Exam   Musculoskeletal Exam: ***  CDAI Exam: CDAI Score: - Patient Global: -;  Provider Global: - Swollen: -; Tender: - Joint Exam   No joint exam has been documented for this visit   There is currently no information documented on the homunculus. Go to the Rheumatology activity and complete the homunculus joint exam.  Investigation: No additional findings.  Imaging: No results found.  Recent Labs: Lab Results  Component Value Date   WBC 9.2 06/06/2017   HGB 12.4 06/06/2017   PLT 439 (H) 06/06/2017   NA 136 06/06/2017   K 4.8 06/06/2017   CL 102 06/06/2017   CO2 24 06/06/2017   GLUCOSE 248 (H) 06/06/2017   BUN 21 06/06/2017   CREATININE 0.93 06/06/2017   BILITOT 0.3 06/06/2017   ALKPHOS 54 09/10/2016   AST 18 06/06/2017   ALT 21 06/06/2017   PROT 6.5 03/05/2018   ALBUMIN 4.0 09/10/2016   CALCIUM 9.8 06/06/2017   GFRAA 75 06/06/2017    Speciality Comments: No specialty comments available.  Procedures:  No procedures performed Allergies: Nsaids, Red dye, Bactrim [sulfamethoxazole-trimethoprim], Cephalexin, Levofloxacin, and Nitrofurantoin monohyd macro   Assessment / Plan:     Visit Diagnoses: No diagnosis found.  Orders: No orders of the defined types were placed in this encounter.  No orders of the defined types were placed in this encounter.   Face-to-face time spent with patient was *** minutes. Greater than 50% of time was spent in counseling and coordination of care.  Follow-Up Instructions: No follow-ups on file.   Ofilia Neas, PA-C  Note - This record has been created using Dragon software.  Chart creation errors have been sought, but may not always  have been located. Such creation errors do not reflect on  the standard of medical care.

## 2018-12-06 DIAGNOSIS — J069 Acute upper respiratory infection, unspecified: Secondary | ICD-10-CM | POA: Diagnosis not present

## 2018-12-08 DIAGNOSIS — Z0000 Encounter for general adult medical examination without abnormal findings: Secondary | ICD-10-CM | POA: Diagnosis not present

## 2018-12-08 DIAGNOSIS — K219 Gastro-esophageal reflux disease without esophagitis: Secondary | ICD-10-CM | POA: Diagnosis not present

## 2018-12-08 DIAGNOSIS — M21371 Foot drop, right foot: Secondary | ICD-10-CM | POA: Diagnosis not present

## 2018-12-08 DIAGNOSIS — G9009 Other idiopathic peripheral autonomic neuropathy: Secondary | ICD-10-CM | POA: Diagnosis not present

## 2018-12-08 DIAGNOSIS — E119 Type 2 diabetes mellitus without complications: Secondary | ICD-10-CM | POA: Diagnosis not present

## 2018-12-08 DIAGNOSIS — E1142 Type 2 diabetes mellitus with diabetic polyneuropathy: Secondary | ICD-10-CM | POA: Diagnosis not present

## 2018-12-08 DIAGNOSIS — Z6824 Body mass index (BMI) 24.0-24.9, adult: Secondary | ICD-10-CM | POA: Diagnosis not present

## 2018-12-08 DIAGNOSIS — F331 Major depressive disorder, recurrent, moderate: Secondary | ICD-10-CM | POA: Diagnosis not present

## 2018-12-08 DIAGNOSIS — J069 Acute upper respiratory infection, unspecified: Secondary | ICD-10-CM | POA: Diagnosis not present

## 2018-12-08 DIAGNOSIS — J309 Allergic rhinitis, unspecified: Secondary | ICD-10-CM | POA: Diagnosis not present

## 2018-12-08 DIAGNOSIS — J45909 Unspecified asthma, uncomplicated: Secondary | ICD-10-CM | POA: Diagnosis not present

## 2018-12-08 DIAGNOSIS — E782 Mixed hyperlipidemia: Secondary | ICD-10-CM | POA: Diagnosis not present

## 2018-12-18 ENCOUNTER — Ambulatory Visit: Payer: PPO | Admitting: Physician Assistant

## 2018-12-18 DIAGNOSIS — E782 Mixed hyperlipidemia: Secondary | ICD-10-CM | POA: Diagnosis not present

## 2018-12-18 DIAGNOSIS — G720 Drug-induced myopathy: Secondary | ICD-10-CM | POA: Diagnosis not present

## 2018-12-18 DIAGNOSIS — J45909 Unspecified asthma, uncomplicated: Secondary | ICD-10-CM | POA: Diagnosis not present

## 2018-12-18 DIAGNOSIS — E119 Type 2 diabetes mellitus without complications: Secondary | ICD-10-CM | POA: Diagnosis not present

## 2018-12-26 ENCOUNTER — Other Ambulatory Visit: Payer: Self-pay

## 2018-12-30 ENCOUNTER — Other Ambulatory Visit: Payer: Self-pay

## 2018-12-30 DIAGNOSIS — Z79899 Other long term (current) drug therapy: Secondary | ICD-10-CM

## 2018-12-30 LAB — CBC WITH DIFFERENTIAL/PLATELET
Absolute Monocytes: 604 cells/uL (ref 200–950)
Basophils Absolute: 85 cells/uL (ref 0–200)
Basophils Relative: 0.8 %
Eosinophils Absolute: 170 cells/uL (ref 15–500)
Eosinophils Relative: 1.6 %
HCT: 38.6 % (ref 35.0–45.0)
Hemoglobin: 13.1 g/dL (ref 11.7–15.5)
Lymphs Abs: 3699 cells/uL (ref 850–3900)
MCH: 30.7 pg (ref 27.0–33.0)
MCHC: 33.9 g/dL (ref 32.0–36.0)
MCV: 90.4 fL (ref 80.0–100.0)
MPV: 11.2 fL (ref 7.5–12.5)
Monocytes Relative: 5.7 %
Neutro Abs: 6042 cells/uL (ref 1500–7800)
Neutrophils Relative %: 57 %
Platelets: 492 10*3/uL — ABNORMAL HIGH (ref 140–400)
RBC: 4.27 10*6/uL (ref 3.80–5.10)
RDW: 14.3 % (ref 11.0–15.0)
Total Lymphocyte: 34.9 %
WBC: 10.6 10*3/uL (ref 3.8–10.8)

## 2018-12-30 LAB — COMPLETE METABOLIC PANEL WITH GFR
AG Ratio: 1.5 (calc) (ref 1.0–2.5)
ALT: 19 U/L (ref 6–29)
AST: 14 U/L (ref 10–35)
Albumin: 3.9 g/dL (ref 3.6–5.1)
Alkaline phosphatase (APISO): 57 U/L (ref 37–153)
BUN: 23 mg/dL (ref 7–25)
CO2: 21 mmol/L (ref 20–32)
Calcium: 9.8 mg/dL (ref 8.6–10.4)
Chloride: 105 mmol/L (ref 98–110)
Creat: 0.66 mg/dL (ref 0.50–0.99)
GFR, Est African American: 107 mL/min/{1.73_m2} (ref >=60)
GFR, Est Non African American: 93 mL/min/{1.73_m2} (ref >=60)
Globulin: 2.6 g/dL (calc) (ref 1.9–3.7)
Glucose, Bld: 153 mg/dL — ABNORMAL HIGH (ref 65–99)
Potassium: 4 mmol/L (ref 3.5–5.3)
Sodium: 138 mmol/L (ref 135–146)
Total Bilirubin: 0.3 mg/dL (ref 0.2–1.2)
Total Protein: 6.5 g/dL (ref 6.1–8.1)

## 2018-12-30 NOTE — Progress Notes (Signed)
Glucose is mildly elevated rest of the CMP is normal.

## 2019-01-05 ENCOUNTER — Other Ambulatory Visit: Payer: Self-pay | Admitting: *Deleted

## 2019-01-05 ENCOUNTER — Telehealth: Payer: Self-pay | Admitting: Rheumatology

## 2019-01-05 MED ORDER — METHOTREXATE 2.5 MG PO TABS: ORAL_TABLET | ORAL | 0 refills | Status: DC

## 2019-01-05 NOTE — Telephone Encounter (Signed)
Notes recorded by Ofilia Neas, PA-C on 12/30/2018 at 2:44 PM EST  Plts are elevated but stable. Rest of CBC WNL.  Last RF 10/29/2018 Last visit 07/17/2018 Next visit 01/12/2019

## 2019-01-05 NOTE — Telephone Encounter (Signed)
Ok to refill MTX

## 2019-01-05 NOTE — Telephone Encounter (Signed)
Patient left a voicemail requesting prescription refill of Methotrexate to be sent to [facility].

## 2019-01-05 NOTE — Telephone Encounter (Signed)
I called patient, MTX RF 90 day

## 2019-01-08 NOTE — Progress Notes (Signed)
Virtual Visit via Telephone Note  I connected with Stephanie Norris on 01/09/19 at 11:30 AM EST by telephone and verified that I am speaking with the correct person using two identifiers.  Location: Patient: Home  Provider: Clinic   This service was conducted via virtual visit.  The patient was located at home. I was located in my office.  Consent was obtained prior to the virtual visit and is aware of possible charges through their insurance for this visit.  The patient is an established patient.  Dr. Estanislado Pandy, MD conducted the virtual visit and Hazel Sams, PA-C acted as scribe during the service.  Office staff helped with scheduling follow up visits after the service was conducted.   I discussed the limitations, risks, security and privacy concerns of performing an evaluation and management service by telephone and the availability of in person appointments. I also discussed with the patient that there may be a patient responsible charge related to this service. The patient expressed understanding and agreed to proceed.  CC: Pain in both hands History of Present Illness: Patient is a 63 year old female with a history of seropositive rheumatoid arthritis, osteoarthritis, fibromyalgia, and DDD. She is taking methotrexate 6 tablets by mouth once weekly and folic acid 2 mg po daily. She denies any recent rheumatoid arthritis flares. She continues to have pain in both hands but denies any joint swelling. She states the mucin cyst on the left index finger has resolved.  She has intermittent trochanteric bursitis bilaterally. She has chronic fatigue. Her fibromyalgia pain has been manageable recently. She continues to have chronic neck pain but her lower back has been doing well.  Review of Systems  Constitutional: Positive for malaise/fatigue. Negative for fever.  HENT:       +Dry mouth  Eyes: Negative for photophobia, pain, discharge and redness.       +Dry eyes  Respiratory: Negative for cough,  shortness of breath and wheezing.   Cardiovascular: Negative for chest pain and palpitations.  Gastrointestinal: Negative for blood in stool, constipation and diarrhea.  Genitourinary: Negative for dysuria.  Musculoskeletal: Positive for joint pain. Negative for back pain, myalgias and neck pain.       +Morning stiffness   Skin: Negative for rash.  Neurological: Negative for dizziness and headaches.  Psychiatric/Behavioral: Negative for depression. The patient is not nervous/anxious and does not have insomnia.    She rates her RA a 3/10 currently.    Observations/Objective: Physical Exam  Constitutional: She is oriented to person, place, and time.  Neurological: She is alert and oriented to person, place, and time.  Psychiatric: Mood, memory, affect and judgment normal.   Patient reports morning stiffness for  10 minutes.   Patient denies nocturnal pain.  Difficulty dressing/grooming: Denies Difficulty climbing stairs: Denies Difficulty getting out of chair: Denies Difficulty using hands for taps, buttons, cutlery, and/or writing: Denies   Assessment and Plan: Visit Diagnoses: Rheumatoid arthritis with rheumatoid factor of multiple sites without organ or systems involvement ([place]) - +CCP: She has not had any recent rheumatoid arthritis flares. She has discomfort in both hands but no joint swelling.  She has morning stiffness lasting about 10 minutes daily.  She is clinically doing well on Methotrexate 6 tablets by mouth once weekly and folic acid 2 mg po daily.  She will continue on this current treatment regimen.  She does not need any refills at this time. She was advised to notify us if she develops increased joint pain or  joint swelling.  She will follow up in 4 months.   HLA B27 (HLA B27 positive)   High risk medication use - She is on methotrexate 6 tablets every 7 days and folic acid 1 mg 2 tablets daily.  CBC and CMP were drawn on 12/30/18. She will be due to update lab work  in February and every 3 months.   Primary osteoarthritis of both hands-She has chronic pain in both hands.  No joint swelling. Joint protection and muscle strengthening were discussed.   Primary osteoarthritis of both feet -She has no discomfort in her feet at this time.  No joint swelling.   DDD (degenerative disc disease), cervical -She has chronic neck pain and stiffness.  She has no symptoms of radiculopathy.  Neck exercises were discussed.   DDD (degenerative disc disease), lumbar-She is not having any lower back pain at this time.   Fibromyalgia -Her fibromyalgia pain has been manageable recently.  She was encouraged to stay active and exercise on a regular basis.  She has chronic fatigue but it has been stable recently.      Trochanteric bursitis of both hips-She has intermittent symptoms of trochanteric bursitis.  She has no discomfort at this time.   Other fatigue - Chronic but stable.   Other medical conditions are listed as follows:   History of asthma   History of sleep apnea   History of diabetes mellitus   History of depression   Follow Up Instructions: She will follow up in 4 months.    I discussed the assessment and treatment plan with the patient. The patient was provided an opportunity to ask questions and all were answered. The patient agreed with the plan and demonstrated an understanding of the instructions.   The patient was advised to call back or seek an in-person evaluation if the symptoms worsen or if the condition fails to improve as anticipated.  I provided 15 minutes of non-face-to-face time during this encounter.   Bo Merino, MD   Scribed by-  Hazel Sams, PA-C

## 2019-01-09 ENCOUNTER — Telehealth (INDEPENDENT_AMBULATORY_CARE_PROVIDER_SITE_OTHER): Payer: PPO | Admitting: Rheumatology

## 2019-01-09 ENCOUNTER — Encounter: Payer: Self-pay | Admitting: Rheumatology

## 2019-01-09 ENCOUNTER — Telehealth: Payer: Self-pay | Admitting: Rheumatology

## 2019-01-09 ENCOUNTER — Other Ambulatory Visit: Payer: Self-pay

## 2019-01-09 DIAGNOSIS — M19072 Primary osteoarthritis, left ankle and foot: Secondary | ICD-10-CM | POA: Diagnosis not present

## 2019-01-09 DIAGNOSIS — M5030 Other cervical disc degeneration, unspecified cervical region: Secondary | ICD-10-CM

## 2019-01-09 DIAGNOSIS — E1140 Type 2 diabetes mellitus with diabetic neuropathy, unspecified: Secondary | ICD-10-CM | POA: Diagnosis not present

## 2019-01-09 DIAGNOSIS — Z8709 Personal history of other diseases of the respiratory system: Secondary | ICD-10-CM

## 2019-01-09 DIAGNOSIS — M19071 Primary osteoarthritis, right ankle and foot: Secondary | ICD-10-CM

## 2019-01-09 DIAGNOSIS — M7062 Trochanteric bursitis, left hip: Secondary | ICD-10-CM

## 2019-01-09 DIAGNOSIS — E782 Mixed hyperlipidemia: Secondary | ICD-10-CM | POA: Diagnosis not present

## 2019-01-09 DIAGNOSIS — E1121 Type 2 diabetes mellitus with diabetic nephropathy: Secondary | ICD-10-CM | POA: Diagnosis not present

## 2019-01-09 DIAGNOSIS — M19042 Primary osteoarthritis, left hand: Secondary | ICD-10-CM | POA: Diagnosis not present

## 2019-01-09 DIAGNOSIS — M5136 Other intervertebral disc degeneration, lumbar region: Secondary | ICD-10-CM | POA: Diagnosis not present

## 2019-01-09 DIAGNOSIS — Z8639 Personal history of other endocrine, nutritional and metabolic disease: Secondary | ICD-10-CM

## 2019-01-09 DIAGNOSIS — M19041 Primary osteoarthritis, right hand: Secondary | ICD-10-CM | POA: Diagnosis not present

## 2019-01-09 DIAGNOSIS — Z8669 Personal history of other diseases of the nervous system and sense organs: Secondary | ICD-10-CM

## 2019-01-09 DIAGNOSIS — Z1589 Genetic susceptibility to other disease: Secondary | ICD-10-CM

## 2019-01-09 DIAGNOSIS — M7061 Trochanteric bursitis, right hip: Secondary | ICD-10-CM | POA: Diagnosis not present

## 2019-01-09 DIAGNOSIS — J45909 Unspecified asthma, uncomplicated: Secondary | ICD-10-CM | POA: Diagnosis not present

## 2019-01-09 DIAGNOSIS — R5383 Other fatigue: Secondary | ICD-10-CM

## 2019-01-09 DIAGNOSIS — Z8659 Personal history of other mental and behavioral disorders: Secondary | ICD-10-CM

## 2019-01-09 DIAGNOSIS — K219 Gastro-esophageal reflux disease without esophagitis: Secondary | ICD-10-CM | POA: Diagnosis not present

## 2019-01-09 DIAGNOSIS — R7301 Impaired fasting glucose: Secondary | ICD-10-CM | POA: Diagnosis not present

## 2019-01-09 DIAGNOSIS — M797 Fibromyalgia: Secondary | ICD-10-CM

## 2019-01-09 DIAGNOSIS — M0579 Rheumatoid arthritis with rheumatoid factor of multiple sites without organ or systems involvement: Secondary | ICD-10-CM

## 2019-01-09 DIAGNOSIS — E7849 Other hyperlipidemia: Secondary | ICD-10-CM | POA: Diagnosis not present

## 2019-01-09 DIAGNOSIS — R300 Dysuria: Secondary | ICD-10-CM | POA: Diagnosis not present

## 2019-01-09 DIAGNOSIS — Z79899 Other long term (current) drug therapy: Secondary | ICD-10-CM

## 2019-01-09 DIAGNOSIS — E119 Type 2 diabetes mellitus without complications: Secondary | ICD-10-CM | POA: Diagnosis not present

## 2019-01-09 DIAGNOSIS — M51369 Other intervertebral disc degeneration, lumbar region without mention of lumbar back pain or lower extremity pain: Secondary | ICD-10-CM

## 2019-01-09 MED ORDER — METHOTREXATE 2.5 MG PO TABS: ORAL_TABLET | ORAL | 0 refills | Status: DC

## 2019-01-09 NOTE — Telephone Encounter (Signed)
Patient had labs done last week, and requests results to be sent to GP, Dr. Wende Neighbors.

## 2019-01-09 NOTE — Telephone Encounter (Signed)
Last Visit: 07/17/2018 Next Visit: 01/09/2019 Labs: 12/30/2018 Plts are elevated but stable. Rest of CBC WNL.Glucose is mildly elevated rest of the CMP is normal.   Okay to refill per Dr. Estanislado Pandy.

## 2019-01-09 NOTE — Telephone Encounter (Signed)
Request refiil on MTX , 6 pills per week. Please send to Pharmacy.

## 2019-01-09 NOTE — Telephone Encounter (Signed)
Labs have been forwarded to PCP 

## 2019-01-12 ENCOUNTER — Ambulatory Visit: Payer: PPO | Admitting: Rheumatology

## 2019-01-12 ENCOUNTER — Telehealth: Payer: Self-pay | Admitting: Rheumatology

## 2019-01-12 NOTE — Telephone Encounter (Signed)
Lab results have been re-faxed.

## 2019-01-12 NOTE — Telephone Encounter (Signed)
Ginger from Dr. Juel Burrow office left a message requesting patient's most recent lab results to be sent to their office for an appointment the patient has today. Please fax to # [phone] attn. Ginger

## 2019-01-21 DIAGNOSIS — J0100 Acute maxillary sinusitis, unspecified: Secondary | ICD-10-CM | POA: Diagnosis not present

## 2019-01-23 ENCOUNTER — Telehealth: Payer: Self-pay | Admitting: Rheumatology

## 2019-01-23 NOTE — Telephone Encounter (Signed)
I LMOM for patient to call, and schedule a follow up appointment around 05/09/2019.

## 2019-01-23 NOTE — Telephone Encounter (Signed)
-----   Message from Shona Needles, RT sent at 01/13/2019 11:10 AM EST ----- Regarding: 4 MONTHS F/U

## 2019-02-06 HISTORY — PX: BACK SURGERY: SHX140

## 2019-02-10 ENCOUNTER — Telehealth: Payer: Self-pay | Admitting: Rheumatology

## 2019-02-10 NOTE — Telephone Encounter (Signed)
Patient having lower back pain down left leg into foot. Patient has numbness/ tingling down leg, and pins and needles sensation. Patient states this has been going on for two weeks now.  Patient wanted to know if doctor thought a shot in office could help with this. Please call to advise.

## 2019-02-11 NOTE — Telephone Encounter (Signed)
I returned patient's call.  She has seen Dr. Arnoldo Morale in the past.  I advised her to schedule an appointment with Dr. Arnoldo Morale or Dr. Maryjean Ka for the injection.

## 2019-02-18 DIAGNOSIS — R350 Frequency of micturition: Secondary | ICD-10-CM | POA: Diagnosis not present

## 2019-02-18 DIAGNOSIS — N3946 Mixed incontinence: Secondary | ICD-10-CM | POA: Diagnosis not present

## 2019-02-19 DIAGNOSIS — E7849 Other hyperlipidemia: Secondary | ICD-10-CM | POA: Diagnosis not present

## 2019-02-19 DIAGNOSIS — G720 Drug-induced myopathy: Secondary | ICD-10-CM | POA: Diagnosis not present

## 2019-02-19 DIAGNOSIS — J45909 Unspecified asthma, uncomplicated: Secondary | ICD-10-CM | POA: Diagnosis not present

## 2019-02-19 DIAGNOSIS — E119 Type 2 diabetes mellitus without complications: Secondary | ICD-10-CM | POA: Diagnosis not present

## 2019-02-22 ENCOUNTER — Telehealth: Payer: Self-pay | Admitting: Rheumatology

## 2019-02-23 NOTE — Telephone Encounter (Signed)
Did you mean to send this to me?

## 2019-02-25 DIAGNOSIS — F419 Anxiety disorder, unspecified: Secondary | ICD-10-CM | POA: Diagnosis not present

## 2019-02-25 DIAGNOSIS — R0602 Shortness of breath: Secondary | ICD-10-CM | POA: Diagnosis not present

## 2019-02-25 DIAGNOSIS — I10 Essential (primary) hypertension: Secondary | ICD-10-CM | POA: Diagnosis not present

## 2019-02-25 DIAGNOSIS — J45909 Unspecified asthma, uncomplicated: Secondary | ICD-10-CM | POA: Diagnosis not present

## 2019-02-26 ENCOUNTER — Telehealth: Payer: Self-pay | Admitting: Rheumatology

## 2019-02-26 NOTE — Telephone Encounter (Signed)
Patient left a voicemail stating she called her pharmacy to request a prescription refill of Methotrexate and was told that they did contact Dr. Arlean Hopping office and received a denial.  Patient is requesting a return call.

## 2019-02-26 NOTE — Telephone Encounter (Addendum)
Patient advised the prescription was denied because a 90 day supply was sent in on 01/09/2019. Patient will contact her pharmacy.

## 2019-03-02 ENCOUNTER — Other Ambulatory Visit: Payer: Self-pay | Admitting: Rheumatology

## 2019-03-10 DIAGNOSIS — I10 Essential (primary) hypertension: Secondary | ICD-10-CM | POA: Diagnosis not present

## 2019-03-10 DIAGNOSIS — E1165 Type 2 diabetes mellitus with hyperglycemia: Secondary | ICD-10-CM | POA: Diagnosis not present

## 2019-03-10 DIAGNOSIS — Z0000 Encounter for general adult medical examination without abnormal findings: Secondary | ICD-10-CM | POA: Diagnosis not present

## 2019-03-10 DIAGNOSIS — R002 Palpitations: Secondary | ICD-10-CM | POA: Diagnosis not present

## 2019-03-18 DIAGNOSIS — H9202 Otalgia, left ear: Secondary | ICD-10-CM | POA: Diagnosis not present

## 2019-03-18 DIAGNOSIS — R42 Dizziness and giddiness: Secondary | ICD-10-CM | POA: Diagnosis not present

## 2019-03-24 ENCOUNTER — Ambulatory Visit (INDEPENDENT_AMBULATORY_CARE_PROVIDER_SITE_OTHER): Payer: PPO | Admitting: Family Medicine

## 2019-03-24 ENCOUNTER — Other Ambulatory Visit: Payer: Self-pay

## 2019-03-24 ENCOUNTER — Encounter: Payer: Self-pay | Admitting: Family Medicine

## 2019-03-24 DIAGNOSIS — J4540 Moderate persistent asthma, uncomplicated: Secondary | ICD-10-CM

## 2019-03-24 DIAGNOSIS — J3089 Other allergic rhinitis: Secondary | ICD-10-CM | POA: Diagnosis not present

## 2019-03-24 DIAGNOSIS — J3489 Other specified disorders of nose and nasal sinuses: Secondary | ICD-10-CM

## 2019-03-24 DIAGNOSIS — K219 Gastro-esophageal reflux disease without esophagitis: Secondary | ICD-10-CM | POA: Diagnosis not present

## 2019-03-24 MED ORDER — BACITRACIN-POLYMYXIN B 500-10000 UNIT/GM EX OINT: TOPICAL_OINTMENT | CUTANEOUS | 0 refills | Status: DC

## 2019-03-24 MED ORDER — IPRATROPIUM BROMIDE 0.06 % NA SOLN: 2.0000 | Freq: Three times a day (TID) | NASAL | 5 refills | Status: DC

## 2019-03-24 NOTE — Progress Notes (Signed)
RE: Stephanie Norris MRN: [id number] DOB: Dec 11, 1955 Date of Telemedicine Visit: 03/24/2019  Referring provider: Celene Squibb, MD Primary care provider: Celene Squibb, MD  Chief Complaint: Follow-up and Shortness of Breath   Telemedicine Follow Up Visit via Telephone: I connected with [name] for a follow up on 03/24/19 by telephone and verified that I am speaking with the correct person using two identifiers.   I discussed the limitations, risks, security and privacy concerns of performing an evaluation and management service by telephone and the availability of in person appointments. I also discussed with the patient that there may be a patient responsible charge related to this service. The patient expressed understanding and agreed to proceed.  Patient is at home  Provider is at the office.  Visit start time: 8:40  Visit end time: 9:00 Insurance consent/check in by: [name] Medical consent and medical assistant/nurse: Asheligh  History of Present Illness: She is a 64 y.o. female, who is being followed for asthma, allergic rhinitis, and reflux.. Her previous allergy office visit was on 10/15/2017 with Dr. Verlin Fester. In the interim, she was diagnosed with [phone] in [place]. At today's visit, she reports that her asthma has been moderately well controlled with infrequent shortness of breath and wheeze occurring with strenuous activity and and on again/off again dry cough.  She is currently taking Brio Ellipta 200-1 puff once a day, montelukast 10 mg once a day, and using albuterol 2-3 times a week with relief of symptoms.  She reports that before she had Covid in October she was not using albuterol.  Allergic rhinitis is reported as not well controlled with constant clear drainage occurring daily for which she is using Xyzal once a day, ipratropium daily, and Flonase daily.  She reports poor application technique with her nasal sprays.  She reports sores on the inside of her right  nostril that began 9 months ago.  She reports these areas are not bleeding.  She uses antibiotic cream a couple of times a week with no improvement in these areas.  She is not currently using saline nasal spray or saline nasal gel.  Reflux is reported as well controlled with pantoprazole 40 mg once a day.  Her current medications are listed in the chart.  Assessment and Plan: Asthma Continue Breo Ellipta 200-1 puff once a day to prevent cough or wheeze Continue montelukast 10 mg once a day to prevent cough or wheeze Continue albuterol 2 puffs every 4 hours as needed for cough or wheeze Use albuterol 2 puffs 5-15 minutes before activity or exercise to decrease cough or wheeze  Allergic rhinitis Continue avoidance measures directed toward molds and ragweed pollen. Handouts provided Begin saline nasal rinses as needed for nasal symptoms. Use this before any medicated nasal sprays for best result Continue Flonase 1-2 sprays in each nostril once a day as needed for a stuffy nose.  In the right nostril, point the applicator out toward the right ear. In the left nostril, point the applicator out toward the left ear Continue ipratroprium nasal sprays in each nostril twice a day as needed for a runny nose Begin nasal saline gel as needed for dry nostrils  Reflux Continue dietary and lifestyle modifications. Handout given Continue pantoprazole 40 mg once a day as previously prescribed.   Sore in nostril Begin polysporin twice a day to the sore in your right nostril for the next 7 days. If your symptoms do not resolve after 7 days, follow up  with your primary care provider for further evaluation and treatment  Call the clinic if this treatment plan is not working well for you  Follow up in 6 months or sooner if needed.  Return in about 6 months (around 09/21/2019), or if symptoms worsen or fail to improve.  Meds ordered this encounter  Medications  . ipratropium (ATROVENT) 0.06 % nasal spray     Sig: Place 2 sprays into both nostrils 3 (three) times daily.    Dispense:  15 mL    Refill:  5  . bacitracin-polymyxin b (POLYSPORIN) ointment    Sig: 1 application 2 times daily inside nostril for 7 days, then stop.    Dispense:  30 g    Refill:  0    Medication List:  Current Outpatient Medications  Medication Sig Dispense Refill  . albuterol (PROVENTIL HFA;VENTOLIN HFA) 108 (90 Base) MCG/ACT inhaler Inhale 2 puffs into the lungs every 4 (four) hours as needed for wheezing or shortness of breath. 1 Inhaler 1  . canagliflozin (INVOKANA) 100 MG TABS tablet Take 100 mg by mouth daily before breakfast.    . Continuous Blood Gluc Sensor ([place]) MISC as needed.  3  . diclofenac sodium (VOLTAREN) 1 % GEL Apply 3 grams to three large joints up to three times daily as needed 3 Tube 3  . DULoxetine (CYMBALTA) 60 MG capsule TAKE 1 CAPSULE BY MOUTH EVERY DAY 90 capsule 0  . fluticasone (FLONASE) 50 MCG/ACT nasal spray Place 2 sprays into both nostrils daily. 16 g 3  . fluticasone furoate-vilanterol (BREO ELLIPTA) 200-25 MCG/INH AEPB Inhale 1 puff into the lungs daily. 1 each 3  . folic acid (FOLVITE) 1 MG tablet Take 2 tablets (2 mg total) by mouth daily. 180 tablet 4  . INVOKANA 300 MG TABS tablet 150 mg, patient states she takes 1/2 tab    . levocetirizine (XYZAL) 5 MG tablet Take 5 mg by mouth every evening.    . metFORMIN (GLUCOPHAGE-XR) 500 MG 24 hr tablet Take 1,000 mg by mouth 2 (two) times daily.     . methotrexate (RHEUMATREX) 2.5 MG tablet TAKE 6 TABLETS BY MOUTH ONCE WEEKLY. Caution:Chemotherapy. Protect from light. 72 tablet 0  . montelukast (SINGULAIR) 10 MG tablet Take 1 tablet (10 mg total) by mouth at bedtime. 90 tablet 1  . Multiple Vitamins-Minerals (MULTIVITAMIN PO) Take by mouth.    . mupirocin ointment (BACTROBAN) 2 % Apply 1 application topically 2 (two) times daily. 22 g 0  . omega-3 acid ethyl esters (LOVAZA) 1 g capsule Take 2 capsules by mouth 2  (two) times daily.    . Omega-3 Fatty Acids (FISH OIL) 1000 MG CAPS Take 1 capsule by mouth daily.    Glory Rosebush VERIO test strip     . pantoprazole (PROTONIX) 40 MG tablet Take 1 tablet by mouth 2 (two) times daily.   5  . bacitracin-polymyxin b (POLYSPORIN) ointment 1 application 2 times daily inside nostril for 7 days, then stop. 30 g 0  . ipratropium (ATROVENT) 0.06 % nasal spray Place 2 sprays into both nostrils 3 (three) times daily. 15 mL 5   No current facility-administered medications for this visit.   Allergies: Allergies  Allergen Reactions  . Nsaids Anaphylaxis  . Red Dye Rash    Itching Itching  . Bactrim [Sulfamethoxazole-Trimethoprim]     Upset stomach  . Cephalexin     rash  . Levofloxacin     rash  . Nitrofurantoin Monohyd  Macro     rash   I reviewed her past medical history, social history, family history, and environmental history and no significant changes have been reported from previous visit on 10/15/2017.  Objective: Physical Exam Not obtained as encounter was done via telephone.   Previous notes and tests were reviewed.  I discussed the assessment and treatment plan with the patient. The patient was provided an opportunity to ask questions and all were answered. The patient agreed with the plan and demonstrated an understanding of the instructions.   The patient was advised to call back or seek an in-person evaluation if the symptoms worsen or if the condition fails to improve as anticipated.  I provided 20 minutes of non-face-to-face time during this encounter.  It was my pleasure to participate in Topstone Lundblad's care today. Please feel free to contact me with any questions or concerns.   Sincerely,  Gareth Morgan, FNP   I have provided oversight concerning Gareth Morgan' evaluation and treatment of this patient's health issues addressed during today's encounter. I agree with the assessment and therapeutic plan as outlined in the note.   Thank you for  the opportunity to care for this patient.  Please do not hesitate to contact me with questions.  Penne Lash, M.D.  [facility] [address] [place], [place] [address] [phone]

## 2019-03-24 NOTE — Patient Instructions (Addendum)
Asthma Continue Breo Ellipta 200-1 puff once a day to prevent cough or wheeze Continue montelukast 10 mg once a day to prevent cough or wheeze Continue albuterol 2 puffs every 4 hours as needed for cough or wheeze Use albuterol 2 puffs 5-15 minutes before activity or exercise to decrease cough or wheeze  Allergic rhinitis Continue avoidance measures directed toward molds and ragweed pollen. Handouts provided Begin saline nasal rinses as needed for nasal symptoms. Use this before any medicated nasal sprays for best result Continue Flonase 1-2 sprays in each nostril once a day as needed for a stuffy nose.  In the right nostril, point the applicator out toward the right ear. In the left nostril, point the applicator out toward the left ear Continue ipratroprium nasal sprays in each nostril twice a day as needed for a runny nose Begin nasal saline gel as needed for dry nostrils  Reflux Continue dietary and lifestyle modifications. Handout given Continue pantoprazole 40 mg once a day as previously prescribed.   Sore in nostril Begin polysporin twice a day to the sore in your right nostril for the next 7 days. If your symptoms do not resolve after 7 days, follow up with your primary care provider for further evaluation and treatment  Call the clinic if this treatment plan is not working well for you  Follow up in 6 months or sooner if needed.

## 2019-04-02 ENCOUNTER — Ambulatory Visit: Payer: PPO | Admitting: Cardiology

## 2019-04-08 ENCOUNTER — Other Ambulatory Visit: Payer: Self-pay

## 2019-04-08 ENCOUNTER — Ambulatory Visit: Payer: PPO | Admitting: Rheumatology

## 2019-04-08 ENCOUNTER — Encounter: Payer: Self-pay | Admitting: Rheumatology

## 2019-04-08 ENCOUNTER — Telehealth: Payer: Self-pay | Admitting: Rheumatology

## 2019-04-08 VITALS — BP 110/71 | HR 87 | Resp 16 | Ht 59.5 in | Wt 122.5 lb

## 2019-04-08 DIAGNOSIS — Z8709 Personal history of other diseases of the respiratory system: Secondary | ICD-10-CM | POA: Diagnosis not present

## 2019-04-08 DIAGNOSIS — M5136 Other intervertebral disc degeneration, lumbar region: Secondary | ICD-10-CM | POA: Diagnosis not present

## 2019-04-08 DIAGNOSIS — Z8639 Personal history of other endocrine, nutritional and metabolic disease: Secondary | ICD-10-CM

## 2019-04-08 DIAGNOSIS — Z1589 Genetic susceptibility to other disease: Secondary | ICD-10-CM

## 2019-04-08 DIAGNOSIS — M5030 Other cervical disc degeneration, unspecified cervical region: Secondary | ICD-10-CM | POA: Diagnosis not present

## 2019-04-08 DIAGNOSIS — R5383 Other fatigue: Secondary | ICD-10-CM | POA: Diagnosis not present

## 2019-04-08 DIAGNOSIS — G8929 Other chronic pain: Secondary | ICD-10-CM | POA: Diagnosis not present

## 2019-04-08 DIAGNOSIS — M19041 Primary osteoarthritis, right hand: Secondary | ICD-10-CM

## 2019-04-08 DIAGNOSIS — Z79899 Other long term (current) drug therapy: Secondary | ICD-10-CM | POA: Diagnosis not present

## 2019-04-08 DIAGNOSIS — M533 Sacrococcygeal disorders, not elsewhere classified: Secondary | ICD-10-CM | POA: Diagnosis not present

## 2019-04-08 DIAGNOSIS — M797 Fibromyalgia: Secondary | ICD-10-CM | POA: Diagnosis not present

## 2019-04-08 DIAGNOSIS — M0579 Rheumatoid arthritis with rheumatoid factor of multiple sites without organ or systems involvement: Secondary | ICD-10-CM | POA: Diagnosis not present

## 2019-04-08 DIAGNOSIS — M19071 Primary osteoarthritis, right ankle and foot: Secondary | ICD-10-CM | POA: Diagnosis not present

## 2019-04-08 DIAGNOSIS — Z8659 Personal history of other mental and behavioral disorders: Secondary | ICD-10-CM | POA: Diagnosis not present

## 2019-04-08 DIAGNOSIS — M19042 Primary osteoarthritis, left hand: Secondary | ICD-10-CM

## 2019-04-08 DIAGNOSIS — M19072 Primary osteoarthritis, left ankle and foot: Secondary | ICD-10-CM

## 2019-04-08 DIAGNOSIS — M51369 Other intervertebral disc degeneration, lumbar region without mention of lumbar back pain or lower extremity pain: Secondary | ICD-10-CM

## 2019-04-08 DIAGNOSIS — Z8669 Personal history of other diseases of the nervous system and sense organs: Secondary | ICD-10-CM

## 2019-04-08 MED ORDER — TRIAMCINOLONE ACETONIDE 40 MG/ML IJ SUSP
40.0000 mg | INTRAMUSCULAR | Status: AC | PRN
  Administered 2019-04-08: 40 mg via INTRA_ARTICULAR

## 2019-04-08 MED ORDER — LIDOCAINE HCL 1 % IJ SOLN
1.0000 mL | INTRAMUSCULAR | Status: AC | PRN
  Administered 2019-04-08: 1 mL

## 2019-04-08 NOTE — Telephone Encounter (Signed)
Patient called stating she is having "severe right hip SI joint pain."  Patient is requesting to be seen today by Dr. Estanislado Pandy only.  Please advise.

## 2019-04-08 NOTE — Progress Notes (Signed)
Office Visit Note  Patient: Stephanie Norris             Date of Birth: 04/06/1955           MRN: [id number]             PCP: Celene Squibb, MD Referring: Celene Squibb, MD Visit Date: 04/08/2019 Occupation: '@GUAROCC'$ @  Subjective:   (SI joint pain)   History of Present Illness: Stephanie Norris is a 64 y.o. female with history of rheumatoid arthritis, osteoarthritis and degenerative disc disease.  She states she was wearing her rocker-bottom shoes to a funeral and had to stand for a long time after that she started having pain in her SI joints.  None of the other joints are painful or swollen.  She states her rheumatoid arthritis is quite well controlled.  She takes methotrexate 6 tablets p.o. weekly along with folic acid.  Activities of Daily Living:  Patient reports morning stiffness for 15 minutes.   Patient Denies nocturnal pain.  Difficulty dressing/grooming: Denies Difficulty climbing stairs: Denies Difficulty getting out of chair: Denies Difficulty using hands for taps, buttons, cutlery, and/or writing: Reports  Review of Systems  Constitutional: Positive for fatigue. Negative for night sweats, weight gain and weight loss.  HENT: Positive for mouth dryness. Negative for mouth sores, trouble swallowing, trouble swallowing and nose dryness.   Eyes: Positive for dryness. Negative for pain, redness and visual disturbance.  Respiratory: Negative for cough, shortness of breath and difficulty breathing.   Cardiovascular: Negative for chest pain, palpitations, hypertension, irregular heartbeat and swelling in legs/feet.  Gastrointestinal: Positive for constipation. Negative for blood in stool and diarrhea.  Endocrine: Negative for excessive thirst and increased urination.  Genitourinary: Negative for difficulty urinating and vaginal dryness.  Musculoskeletal: Positive for arthralgias, joint pain, morning stiffness and muscle tenderness. Negative for gait problem, joint swelling,  myalgias, muscle weakness and myalgias.  Skin: Negative for color change, rash, hair loss, skin tightness, ulcers and sensitivity to sunlight.  Allergic/Immunologic: Negative for susceptible to infections.  Neurological: Negative for dizziness, numbness, memory loss, night sweats and weakness.  Hematological: Negative for bruising/bleeding tendency and swollen glands.  Psychiatric/Behavioral: Positive for depressed mood. Negative for sleep disturbance. The patient is not nervous/anxious.     PMFS History:  Patient Active Problem List   Diagnosis Date Noted  . Vertigo 08/20/2016  . Eustachian tube dysfunction 04/17/2016  . Abdominal pain 03/02/2016  . Gastroenteritis 03/02/2016  . Rheumatoid arthritis with rheumatoid factor of multiple sites without organ or systems involvement ([place]) 01/13/2016  . HLA B27 (HLA B27 positive) 01/13/2016  . Osteoarthritis of foot 01/13/2016  . DJD (degenerative joint disease), cervical 01/13/2016  . Spondylosis of lumbar region without myelopathy or radiculopathy 01/13/2016  . Primary osteoarthritis of both hands 01/13/2016  . Non-seasonal allergic rhinitis 08/13/2014  . Stiffness of joints, not elsewhere classified, multiple sites 08/26/2013  . Flu-like symptoms 03/02/2013  . Lactic acidosis 03/02/2013  . Sinus tachycardia 03/02/2013  . Elevated lactic acid level 03/02/2013  . Influenza due to identified novel influenza A virus with other respiratory manifestations 03/02/2013  . Hyperglycemia 03/02/2013  . Fibromyalgia   . OSA (obstructive sleep apnea) 06/22/2011  . DOE (dyspnea on exertion) 05/30/2011  . Moderate persistent asthma 03/30/2011    Past Medical History:  Diagnosis Date  . Asthma   . COPD (chronic obstructive pulmonary disease) ([place])   . Diabetes mellitus without complication (HCC)    Type 2  . Dizziness   .  Fibromyalgia   . GERD (gastroesophageal reflux disease)   . Headache   . Hyperlipidemia   . OSA (obstructive sleep apnea)    . Osteoarthritis   . PCOS (polycystic ovarian syndrome)   . Rheumatoid arthritis ([place])     Family History  Problem Relation Age of Onset  . Asthma Mother   . Rheum arthritis Mother   . Allergic rhinitis Mother   . Rheum arthritis Sister   . Allergic rhinitis Sister   . Heart failure Father   . Heart disease Father   . Rheum arthritis Sister   . Allergic rhinitis Sister   . Allergic rhinitis Brother   . Allergies Other        "Everyone"  . Heart disease Maternal Grandfather   . Heart disease Maternal Grandmother   . Heart disease Paternal Grandfather   . Heart disease Paternal Grandmother    Past Surgical History:  Procedure Laterality Date  . APPENDECTOMY    . CHOLECYSTECTOMY    . COLONOSCOPY W/ BIOPSIES  fall 2009  . COMBINED HYSTERECTOMY VAGINAL / OOPHORECTOMY / A&P REPAIR  10/2002   endometriosis, cystocele, fibroids  . KNEE SURGERY  1996  . TOE SURGERY    . VESICOVAGINAL FISTULA CLOSURE W/ TAH     Social History   Social History Narrative   Lives with husband   Retired    no children   Assoc degree   16 oz caffeine daily   Immunization History  Administered Date(s) Administered  . Influenza Whole 10/07/2010, 11/06/2011  . Influenza,inj,Quad PF,6+ Mos 11/05/2012, 11/02/2015, 11/22/2016, 11/15/2017  . Influenza-Unspecified 11/05/2013  . Pneumococcal Conjugate-13 11/06/2014  . Pneumococcal Polysaccharide-23 11/02/2015     Objective: Vital Signs: BP 110/71 (BP Location: Right Arm, Patient Position: Sitting, Cuff Size: Normal)   Pulse 87   Resp 16   Ht 4' 11.5" (1.511 m)   Wt 122 lb 8 oz (55.6 kg)   BMI 24.33 kg/m    Physical Exam Vitals and nursing note reviewed.  Constitutional:      Appearance: She is well-developed.  HENT:     Head: Normocephalic and atraumatic.  Eyes:     Conjunctiva/sclera: Conjunctivae normal.  Cardiovascular:     Rate and Rhythm: Normal rate and regular rhythm.     Heart sounds: Normal heart sounds.  Pulmonary:      Effort: Pulmonary effort is normal.     Breath sounds: Normal breath sounds.  Abdominal:     General: Bowel sounds are normal.     Palpations: Abdomen is soft.  Musculoskeletal:     Cervical back: Normal range of motion.  Lymphadenopathy:     Cervical: No cervical adenopathy.  Skin:    General: Skin is warm and dry.     Capillary Refill: Capillary refill takes less than 2 seconds.  Neurological:     Mental Status: She is alert and oriented to person, place, and time.  Psychiatric:        Behavior: Behavior normal.      Musculoskeletal Exam: C-spine was in good range of motion.  She has limited range of motion her lumbar spine.  She has tenderness on palpation of bilateral SI joint more prominent on the right side.  Shoulder joints, elbow joints, wrist joints, MCPs and PIPs with good range of motion with no synovitis.  She has some DIP thickening.  Hip joints, knee joints, ankles, MTPs and PIPs with good range of motion with no synovitis.  CDAI Exam: CDAI Score: 0.6  Patient Global: 3 mm; Provider Global: 3 mm Swollen: 0 ; Tender: 2  Joint Exam 04/08/2019      Right  Left  Sacroiliac   Tender   Tender     Investigation: No additional findings.  Imaging: No results found.  Recent Labs: Lab Results  Component Value Date   WBC 10.6 12/30/2018   HGB 13.1 12/30/2018   PLT 492 (H) 12/30/2018   NA 138 12/30/2018   K 4.0 12/30/2018   CL 105 12/30/2018   CO2 21 12/30/2018   GLUCOSE 153 (H) 12/30/2018   BUN 23 12/30/2018   CREATININE 0.66 12/30/2018   BILITOT 0.3 12/30/2018   ALKPHOS 54 09/10/2016   AST 14 12/30/2018   ALT 19 12/30/2018   PROT 6.5 12/30/2018   ALBUMIN 4.0 09/10/2016   CALCIUM 9.8 12/30/2018   GFRAA 107 12/30/2018    Speciality Comments: No specialty comments available.  Procedures:  Sacroiliac Joint Inj on 04/08/2019 1:57 PM Indications: pain Details: 27 G 1.5 in needle, posterior approach Medications: 1 mL lidocaine 1 %; 40 mg triamcinolone  acetonide 40 MG/ML Aspirate: 0 mL Outcome: tolerated well, no immediate complications Procedure, treatment alternatives, risks and benefits explained, specific risks discussed. Consent was given by the patient. Immediately prior to procedure a time out was called to verify the correct patient, procedure, equipment, support staff and site/side marked as required. Patient was prepped and draped in the usual sterile fashion.     Allergies: Nsaids, Red dye, Bactrim [sulfamethoxazole-trimethoprim], Cephalexin, Levofloxacin, Macrobid  [nitrofurantoin], Nitrofurantoin monohyd macro, and Other   Assessment / Plan:     Visit Diagnoses: Rheumatoid arthritis with rheumatoid factor of multiple sites without organ or systems involvement (HCC) - +CCP.  Her rheumatoid arthritis is well controlled on methotrexate.  She had no synovitis on examination today.  High risk medication use - methotrexate 6 tablets every 7 days and folic acid 1 mg 2 tablets daily. -Her labs are past due.  We will check labs today and then every 3 months to monitor for drug toxicity.  Plan: CBC with Differential/Platelet, COMPLETE METABOLIC PANEL WITH GFR  Chronic SI joint pain-she has had off-and-on discomfort in her SI joints.  She states she is having a flare in her right SI joints due to wearing improper fitting shoes.  Per her request after informed consent was obtained right SI joint was injected with cortisone.  Post procedure precautions were discussed.  HLA B27 (HLA B27 positive)-she has no features of a spondyloarthropathy.  Primary osteoarthritis of both hands-she has DIP thickening and subluxation of some of the DIP joints.  Joint protection was discussed.  Primary osteoarthritis of both feet-she has been wearing shoes which accommodate her osteoarthritis.  DDD (degenerative disc disease), cervical-she is good fairly good range of motion of her cervical spine today.  DDD (degenerative disc disease), lumbar-she has limited  painful range of motion of her lumbar spine.  Fibromyalgia-she has generalized pain from fibromyalgia and positive tender points.  Other fatigue-she has chronic fatigue from fibromyalgia.  Other medical problems are listed as follows:  History of asthma  History of depression  History of diabetes mellitus  History of sleep apnea  Orders: Orders Placed This Encounter  Procedures  . CBC with Differential/Platelet  . COMPLETE METABOLIC PANEL WITH GFR   No orders of the defined types were placed in this encounter.  .  Follow-Up Instructions: Return in about 5 months (around 09/08/2019) for Rheumatoid arthritis, Osteoarthritis.   Bo Merino, MD  Note -  This record has been created using [employer].  Chart creation errors have been sought, but may not always  have been located. Such creation errors do not reflect on  the standard of medical care.

## 2019-04-08 NOTE — Telephone Encounter (Signed)
I called patient, patient worked in at 1:00 on 04/08/2019.

## 2019-04-08 NOTE — Patient Instructions (Signed)
Standing Labs We placed an order today for your standing lab work.    Please come back and get your standing labs in June and every 3 months.   We have open lab daily Monday through Thursday from 8:30-12:30 PM and 1:30-4:30 PM and Friday from 8:30-12:30 PM and 1:30-4:00 PM at the office of Dr. Katalyna Socarras.   You may experience shorter wait times on Monday and Friday afternoons. The office is located at [address], [address], [place], [place] [address] No appointment is necessary.   Labs are drawn by [facility].  You may receive a bill from [facility] for your lab work.  If you wish to have your labs drawn at another location, please call the office 24 hours in advance to send orders.  If you have any questions regarding directions or hours of operation,  please call [phone].   Just as a reminder please drink plenty of water prior to coming for your lab work. Thanks!  

## 2019-04-09 LAB — COMPLETE METABOLIC PANEL WITH GFR
AG Ratio: 1.9 (calc) (ref 1.0–2.5)
ALT: 23 U/L (ref 6–29)
AST: 15 U/L (ref 10–35)
Albumin: 4.2 g/dL (ref 3.6–5.1)
Alkaline phosphatase (APISO): 52 U/L (ref 37–153)
BUN/Creatinine Ratio: 47 (calc) — ABNORMAL HIGH (ref 6–22)
BUN: 33 mg/dL — ABNORMAL HIGH (ref 7–25)
CO2: 21 mmol/L (ref 20–32)
Calcium: 10 mg/dL (ref 8.6–10.4)
Chloride: 107 mmol/L (ref 98–110)
Creat: 0.7 mg/dL (ref 0.50–0.99)
GFR, Est African American: 105 mL/min/{1.73_m2} (ref >=60)
GFR, Est Non African American: 91 mL/min/{1.73_m2} (ref >=60)
Globulin: 2.2 g/dL (calc) (ref 1.9–3.7)
Glucose, Bld: 108 mg/dL — ABNORMAL HIGH (ref 65–99)
Potassium: 4.3 mmol/L (ref 3.5–5.3)
Sodium: 139 mmol/L (ref 135–146)
Total Bilirubin: 0.2 mg/dL (ref 0.2–1.2)
Total Protein: 6.4 g/dL (ref 6.1–8.1)

## 2019-04-09 LAB — CBC WITH DIFFERENTIAL/PLATELET
Absolute Monocytes: 449 cells/uL (ref 200–950)
Basophils Absolute: 54 cells/uL (ref 0–200)
Basophils Relative: 0.5 %
Eosinophils Absolute: 214 cells/uL (ref 15–500)
Eosinophils Relative: 2 %
HCT: 39.8 % (ref 35.0–45.0)
Hemoglobin: 13.4 g/dL (ref 11.7–15.5)
Lymphs Abs: 2889 cells/uL (ref 850–3900)
MCH: 30.9 pg (ref 27.0–33.0)
MCHC: 33.7 g/dL (ref 32.0–36.0)
MCV: 91.7 fL (ref 80.0–100.0)
MPV: 10.8 fL (ref 7.5–12.5)
Monocytes Relative: 4.2 %
Neutro Abs: 7094 cells/uL (ref 1500–7800)
Neutrophils Relative %: 66.3 %
Platelets: 433 10*3/uL — ABNORMAL HIGH (ref 140–400)
RBC: 4.34 10*6/uL (ref 3.80–5.10)
RDW: 15.6 % — ABNORMAL HIGH (ref 11.0–15.0)
Total Lymphocyte: 27 %
WBC: 10.7 10*3/uL (ref 3.8–10.8)

## 2019-04-09 NOTE — Progress Notes (Signed)
CBC and CMP are stable.

## 2019-04-14 ENCOUNTER — Telehealth: Payer: Self-pay | Admitting: Rheumatology

## 2019-04-14 NOTE — Telephone Encounter (Signed)
Please advise 

## 2019-04-14 NOTE — Telephone Encounter (Signed)
Patient called stating she had an appointment last Wednesday, 04/08/19 and had a cortisone injection in her right SI joint.  Patient states her left hip is now painful.  Patient states she had an old prescription of Prednisone 4 mg dose pack which she got refilled and has been taking for the last 3 days, but hip is still painful.  Patient is requesting a return call to discuss possibly having a cortisone injection in her left hip.

## 2019-04-14 NOTE — Telephone Encounter (Signed)
Patient wants to schedule an appointment for left trochanteric bursa injection.  Please schedule a next available appointment.

## 2019-04-15 ENCOUNTER — Ambulatory Visit: Payer: PPO | Admitting: Rheumatology

## 2019-04-15 ENCOUNTER — Other Ambulatory Visit: Payer: Self-pay

## 2019-04-15 VITALS — BP 107/71 | HR 103

## 2019-04-15 DIAGNOSIS — M533 Sacrococcygeal disorders, not elsewhere classified: Secondary | ICD-10-CM | POA: Diagnosis not present

## 2019-04-15 DIAGNOSIS — G8929 Other chronic pain: Secondary | ICD-10-CM

## 2019-04-15 MED ORDER — LIDOCAINE HCL 1 % IJ SOLN
1.0000 mL | INTRAMUSCULAR | Status: AC | PRN
  Administered 2019-04-15: 1 mL

## 2019-04-15 MED ORDER — TRIAMCINOLONE ACETONIDE 40 MG/ML IJ SUSP
40.0000 mg | INTRAMUSCULAR | Status: AC | PRN
  Administered 2019-04-15: 40 mg via INTRA_ARTICULAR

## 2019-04-15 NOTE — Progress Notes (Signed)
   Procedure Note  Patient: Stephanie Norris             Date of Birth: 02-16-1955           MRN: [id number]             Visit Date: 04/15/2019  Procedures: Visit Diagnoses:  1. Chronic SI joint pain   Patient had good response to right SI joint injection.  She states that her left SI joint continues to [place] and she would like to have it injected as well.  Sacroiliac Joint Inj on 04/15/2019 3:42 PM Indications: pain Details: 27 G 1.5 in needle, posterior approach Medications: 1 mL lidocaine 1 %; 40 mg triamcinolone acetonide 40 MG/ML Aspirate: 0 mL Outcome: tolerated well, no immediate complications Procedure, treatment alternatives, risks and benefits explained, specific risks discussed. Consent was given by the patient. Immediately prior to procedure a time out was called to verify the correct patient, procedure, equipment, support staff and site/side marked as required. Patient was prepped and draped in the usual sterile fashion.     Patient tolerated the procedure well.  Postprocedure instructions were given. Bo Merino, MD

## 2019-04-15 NOTE — Telephone Encounter (Signed)
LMOM for patient to call office to schedule appt

## 2019-04-15 NOTE — Telephone Encounter (Signed)
Please schedule appt. Thank you

## 2019-04-16 ENCOUNTER — Emergency Department (HOSPITAL_COMMUNITY)
Admission: EM | Admit: 2019-04-16 | Discharge: 2019-04-16 | Disposition: A | Discharge status: Home / Self Care | Payer: PPO | Attending: Emergency Medicine | Admitting: Emergency Medicine

## 2019-04-16 ENCOUNTER — Emergency Department (HOSPITAL_COMMUNITY): Payer: PPO

## 2019-04-16 ENCOUNTER — Encounter (HOSPITAL_COMMUNITY): Payer: Self-pay

## 2019-04-16 ENCOUNTER — Other Ambulatory Visit: Payer: Self-pay

## 2019-04-16 DIAGNOSIS — M5442 Lumbago with sciatica, left side: Secondary | ICD-10-CM | POA: Insufficient documentation

## 2019-04-16 DIAGNOSIS — E119 Type 2 diabetes mellitus without complications: Secondary | ICD-10-CM | POA: Insufficient documentation

## 2019-04-16 DIAGNOSIS — M5432 Sciatica, left side: Secondary | ICD-10-CM

## 2019-04-16 DIAGNOSIS — Z79899 Other long term (current) drug therapy: Secondary | ICD-10-CM | POA: Insufficient documentation

## 2019-04-16 DIAGNOSIS — J449 Chronic obstructive pulmonary disease, unspecified: Secondary | ICD-10-CM | POA: Insufficient documentation

## 2019-04-16 DIAGNOSIS — Z7984 Long term (current) use of oral hypoglycemic drugs: Secondary | ICD-10-CM | POA: Insufficient documentation

## 2019-04-16 DIAGNOSIS — M545 Low back pain: Secondary | ICD-10-CM | POA: Diagnosis not present

## 2019-04-16 HISTORY — DX: Raynaud's syndrome without gangrene: I73.00

## 2019-04-16 LAB — CBG MONITORING, ED: Glucose-Capillary: 105 mg/dL — ABNORMAL HIGH (ref 70–99)

## 2019-04-16 MED ORDER — MORPHINE SULFATE (PF) 4 MG/ML IV SOLN
4.0000 mg | Freq: Once | INTRAVENOUS | Status: AC
  Administered 2019-04-16: 4 mg via INTRAMUSCULAR
  Filled 2019-04-16: qty 1

## 2019-04-16 MED ORDER — PREDNISONE 10 MG (21) PO TBPK: ORAL_TABLET | ORAL | 0 refills | Status: DC

## 2019-04-16 MED ORDER — HYDROCODONE-ACETAMINOPHEN 5-325 MG PO TABS: 1.0000 | ORAL_TABLET | Freq: Four times a day (QID) | ORAL | 0 refills | Status: DC | PRN

## 2019-04-16 NOTE — ED Triage Notes (Signed)
Pt kicked her left leg last Monday and has been having increasing hip pain that radiates doen her left thigh since then. She has taken Ibuprofen and Hydrocodone for the pain. Nothing makes the pain better and movement and lying on the hip makes the pain worse. Pt received a steroid shot yesterday in her S1. This had made the pain much worse.

## 2019-04-16 NOTE — ED Provider Notes (Signed)
[facility] EMERGENCY DEPARTMENT Provider Note   CSN: [id number] Arrival date & time: 04/16/19  6767     History Chief Complaint  Patient presents with  . Hip Pain    Stephanie Norris is a 64 y.o. female.  HPI Patient presents to the emergency department for evaluation of left lower back and hip pain.  She reports approximately 11 days ago she kicked her leg backwards when her cat attacked her foot and since that time has been having left lower back and hip pain radiating down her left leg.  She also reports exacerbation of her chronic right SI joint pain from having to stand at a funeral several days ago.  She has a history of osteoarthritis as well as rheumatoid arthritis and fibromyalgia.  She was seen at the rheumatologist office on March 3 for right sided SI joint pain and had a steroid injection there with good results.  She returned yesterday for continued pain on the left side and had a left SI joint steroid injection which she reports has made her pain worse.  She has been taking hydrocodone and ibuprofen (she has an allergy listed to NSAIDs but states she can take 1 ibuprofen per day without effect.  Otherwise NSAIDs cause her airway to close up).  She denies any fevers, no nausea vomiting diarrhea.  No bowel or bladder incontinence.    Past Medical History:  Diagnosis Date  . Asthma   . COPD (chronic obstructive pulmonary disease) ([place])   . Diabetes mellitus without complication (HCC)    Type 2  . Dizziness   . Fibromyalgia   . GERD (gastroesophageal reflux disease)   . Headache   . Hyperlipidemia   . OSA (obstructive sleep apnea)   . Osteoarthritis   . PCOS (polycystic ovarian syndrome)   . Raynaud disease   . Rheumatoid arthritis [facility])     Patient Active Problem List   Diagnosis Date Noted  . Vertigo 08/20/2016  . Eustachian tube dysfunction 04/17/2016  . Abdominal pain 03/02/2016  . Gastroenteritis 03/02/2016  . Rheumatoid arthritis with rheumatoid factor of  multiple sites without organ or systems involvement ([place]) 01/13/2016  . HLA B27 (HLA B27 positive) 01/13/2016  . Osteoarthritis of foot 01/13/2016  . DJD (degenerative joint disease), cervical 01/13/2016  . Spondylosis of lumbar region without myelopathy or radiculopathy 01/13/2016  . Primary osteoarthritis of both hands 01/13/2016  . Non-seasonal allergic rhinitis 08/13/2014  . Stiffness of joints, not elsewhere classified, multiple sites 08/26/2013  . Flu-like symptoms 03/02/2013  . Lactic acidosis 03/02/2013  . Sinus tachycardia 03/02/2013  . Elevated lactic acid level 03/02/2013  . Influenza due to identified novel influenza A virus with other respiratory manifestations 03/02/2013  . Hyperglycemia 03/02/2013  . Fibromyalgia   . OSA (obstructive sleep apnea) 06/22/2011  . DOE (dyspnea on exertion) 05/30/2011  . Moderate persistent asthma 03/30/2011    Past Surgical History:  Procedure Laterality Date  . APPENDECTOMY    . CHOLECYSTECTOMY    . COLONOSCOPY W/ BIOPSIES  fall 2009  . COMBINED HYSTERECTOMY VAGINAL / OOPHORECTOMY / A&P REPAIR  10/2002   endometriosis, cystocele, fibroids  . KNEE SURGERY  1996  . TOE SURGERY    . VESICOVAGINAL FISTULA CLOSURE W/ TAH       OB History    Gravida  1   Para      Term      Preterm      AB  1   Living  0  SAB      TAB      Ectopic      Multiple      Live Births              Family History  Problem Relation Age of Onset  . Asthma Mother   . Rheum arthritis Mother   . Allergic rhinitis Mother   . Rheum arthritis Sister   . Allergic rhinitis Sister   . Heart failure Father   . Heart disease Father   . Rheum arthritis Sister   . Allergic rhinitis Sister   . Allergic rhinitis Brother   . Allergies Other        "Everyone"  . Heart disease Maternal Grandfather   . Heart disease Maternal Grandmother   . Heart disease Paternal Grandfather   . Heart disease Paternal Grandmother     Social History    Tobacco Use  . Smoking status: Never Smoker  . Smokeless tobacco: Never Used  Substance Use Topics  . Alcohol use: No  . Drug use: No    Home Medications Prior to Admission medications   Medication Sig Start Date End Date Taking? Authorizing Provider  albuterol (PROVENTIL HFA;VENTOLIN HFA) 108 (90 Base) MCG/ACT inhaler Inhale 2 puffs into the lungs every 4 (four) hours as needed for wheezing or shortness of breath. 10/15/17   Bobbitt, [place] Muta, MD  Continuous Blood Gluc Sensor ([place]) MISC as needed. 02/12/17   [provider]  diclofenac sodium (VOLTAREN) 1 % GEL Apply 3 grams to three large joints up to three times daily as needed 02/21/17   Ofilia Neas, PA-C  DULoxetine (CYMBALTA) 60 MG capsule TAKE 1 CAPSULE BY MOUTH EVERY DAY 09/06/17   Bo Merino, MD  fluticasone (FLONASE) 50 MCG/ACT nasal spray Place 2 sprays into both nostrils daily. 10/15/17   Bobbitt, [place] Muta, MD  fluticasone furoate-vilanterol (BREO ELLIPTA) 200-25 MCG/INH AEPB Inhale 1 puff into the lungs daily. 10/15/17   Bobbitt, Sedalia Muta, MD  folic acid (FOLVITE) 1 MG tablet Take 2 tablets (2 mg total) by mouth daily. 03/23/16   Panwala, Naitik, PA-C  HYDROcodone-acetaminophen (NORCO/VICODIN) 5-325 MG tablet Take 1 tablet by mouth every 6 (six) hours as needed for severe pain. 04/16/19   Truddie Hidden, MD  INVOKANA 300 MG TABS tablet 150 mg, patient states she takes 1/2 tab 05/22/18   [provider]  ipratropium (ATROVENT) 0.06 % nasal spray ipratropium bromide 42 mcg (0.06 %) nasal spray    [provider]  metFORMIN (GLUCOPHAGE-XR) 500 MG 24 hr tablet Take 1,000 mg by mouth 2 (two) times daily.  01/04/16   [provider]  methotrexate (RHEUMATREX) 2.5 MG tablet TAKE 6 TABLETS BY MOUTH ONCE WEEKLY. Caution:Chemotherapy. Protect from light. 01/09/19   Bo Merino, MD  montelukast (SINGULAIR) 10 MG tablet Take 1 tablet (10 mg total) by mouth at  bedtime. 10/15/17   Bobbitt, Sed[place] Muta, MD  Multiple Vitamins-Minerals (MULTIVITAMIN PO) Take by mouth.    [provider]  omega-3 acid ethyl esters (LOVAZA) 1 g capsule Take 2 capsules by mouth 2 (two) times daily. 03/11/18   [provider]  ONETOUCH VERIO test strip  01/04/16   [provider]  pantoprazole (PROTONIX) 40 MG tablet Take 1 tablet by mouth 2 (two) times daily.  07/14/14   [provider]  predniSONE (STERAPRED UNI-PAK 21 TAB) 10 MG (21) TBPK tablet Pred-pak '10mg'$  tablets with 6 day taper. Use as directed 04/16/19  Truddie Hidden, MD  valACYclovir (VALTREX) 1000 MG tablet SMARTSIG:2 Tablet(s) By Mouth Every 12 Hours PRN 11/12/18   [provider]    Allergies    Nsaids, Red dye, Bactrim [sulfamethoxazole-trimethoprim], Cephalexin, Levofloxacin, Macrobid  [nitrofurantoin], Nitrofurantoin monohyd macro, and Other  Review of Systems   Review of Systems  Constitutional: Negative for fever.  HENT: Negative for congestion and sore throat.   Respiratory: Negative for cough and shortness of breath.   Cardiovascular: Negative for chest pain.  Gastrointestinal: Negative for abdominal pain, diarrhea, nausea and vomiting.  Genitourinary: Negative for dysuria.  Musculoskeletal: Positive for arthralgias, back pain and myalgias.  Skin: Negative for rash.  Neurological: Negative for headaches.  Psychiatric/Behavioral: Negative for behavioral problems.    Physical Exam Updated Vital Signs BP (!) 143/74 (BP Location: Right Arm)   Pulse 77   Temp 97.6 F (36.4 C) (Oral)   Resp 12   Ht '4\' 11"'$  (1.499 m)   Wt 54 kg   SpO2 98%   BMI 24.04 kg/m   Physical Exam Constitutional:      Appearance: Normal appearance.  HENT:     Head: Normocephalic and atraumatic.     Nose: Nose normal.     Mouth/Throat:     Mouth: Mucous membranes are moist.  Eyes:     Extraocular Movements: Extraocular movements intact.     Conjunctiva/sclera:  Conjunctivae normal.  Cardiovascular:     Rate and Rhythm: Normal rate.  Pulmonary:     Effort: Pulmonary effort is normal.     Breath sounds: Normal breath sounds.  Abdominal:     General: Abdomen is flat.     Palpations: Abdomen is soft.     Tenderness: There is no abdominal tenderness.  Musculoskeletal:        General: Tenderness (Midline lumbar spine and left paraspinal lumbar muscles.) present. No swelling. Normal range of motion.     Cervical back: Neck supple.     Comments: Patient has normal range of motion with minimal pain to the left hip.  Skin:    General: Skin is warm and dry.  Neurological:     General: No focal deficit present.     Mental Status: She is alert.  Psychiatric:        Mood and Affect: Mood normal.     ED Results / Procedures / Treatments   Labs (all labs ordered are listed, but only abnormal results are displayed) Labs Reviewed  CBG MONITORING, ED - Abnormal; Notable for the following components:      Result Value   Glucose-Capillary 105 (*)    All other components within normal limits    EKG None  Radiology DG Lumbar Spine 2-3 Views  Result Date: 04/16/2019 CLINICAL DATA:  Low back pain since Monday. EXAM: LUMBAR SPINE - 2-3 VIEW COMPARISON:  Jun 27, 2012 FINDINGS: There is no acute fracture or dislocation. Decreased intervertebral space is identified at L5-S1. Minimal anterior osteophytosis is identified at L3 and L4. IMPRESSION: No acute fracture or dislocation. Degenerative joint changes of lumbar spine. Electronically Signed   By: Abelardo Diesel M.D.   On: 04/16/2019 08:44    Procedures Procedures (including critical care time)  Medications Ordered in ED Medications  morphine 4 MG/ML injection 4 mg (4 mg Intramuscular Given 04/16/19 0810)    ED Course  I have reviewed the triage vital signs and the nursing notes.  Pertinent labs & imaging results that were available during my care of the patient were  reviewed by me and considered in  my medical decision making (see chart for details).  Clinical Course as of Apr 15 908  Thu Apr 16, 2019  0805 Patient is here with worsening of her chronic left lower back and hip pain.  She has not had relief with an SI joint injection.  She does have some midline tenderness but no recent trauma.  She also has a history of type 2 diabetes and does not check her sugar regularly.  We will check her sugar today given recent steroid injection and check a lumbar spine x-ray.  She will be given pain medication in the ED for comfort.   [CS]  [phone] Patient has had some relief in her pain with IM morphine, her xray does not show any acute findings, but re-demonstrates her known DDD. Her CBG is not elevated. She reports she has been taking Prednisone '4mg'$  daily although this is not reflected in her recent medication list or Rheum visit. We will increase her dose of steroids for a few days and recommend she followup up with her Rheum doctor for recheck if not improved. Norco for the pain.    [CS]    Clinical Course User Index [CS] Truddie Hidden, MD   Final Clinical Impression(s) / ED Diagnoses Final diagnoses:  Sciatica of left side    Rx / DC Orders ED Discharge Orders         Ordered    HYDROcodone-acetaminophen (NORCO/VICODIN) 5-325 MG tablet  Every 6 hours PRN     04/16/19 0904    predniSONE (STERAPRED UNI-PAK 21 TAB) 10 MG (21) TBPK tablet     04/16/19 0909           Truddie Hidden, MD 04/16/19 [phone]

## 2019-04-17 ENCOUNTER — Encounter (HOSPITAL_COMMUNITY): Payer: Self-pay

## 2019-04-17 ENCOUNTER — Emergency Department (HOSPITAL_COMMUNITY): Payer: PPO

## 2019-04-17 ENCOUNTER — Emergency Department (HOSPITAL_COMMUNITY)
Admission: EM | Admit: 2019-04-17 | Discharge: 2019-04-17 | Disposition: A | Discharge status: Home / Self Care | Payer: PPO | Attending: Emergency Medicine | Admitting: Emergency Medicine

## 2019-04-17 ENCOUNTER — Other Ambulatory Visit: Payer: Self-pay

## 2019-04-17 DIAGNOSIS — M25559 Pain in unspecified hip: Secondary | ICD-10-CM

## 2019-04-17 DIAGNOSIS — M25552 Pain in left hip: Secondary | ICD-10-CM | POA: Diagnosis not present

## 2019-04-17 DIAGNOSIS — J45909 Unspecified asthma, uncomplicated: Secondary | ICD-10-CM | POA: Insufficient documentation

## 2019-04-17 DIAGNOSIS — M533 Sacrococcygeal disorders, not elsewhere classified: Secondary | ICD-10-CM | POA: Diagnosis not present

## 2019-04-17 DIAGNOSIS — Z7984 Long term (current) use of oral hypoglycemic drugs: Secondary | ICD-10-CM | POA: Diagnosis not present

## 2019-04-17 DIAGNOSIS — E119 Type 2 diabetes mellitus without complications: Secondary | ICD-10-CM | POA: Diagnosis not present

## 2019-04-17 DIAGNOSIS — J449 Chronic obstructive pulmonary disease, unspecified: Secondary | ICD-10-CM | POA: Diagnosis not present

## 2019-04-17 LAB — BASIC METABOLIC PANEL
Anion gap: 11 (ref 5–15)
BUN: 37 mg/dL — ABNORMAL HIGH (ref 8–23)
CO2: 18 mmol/L — ABNORMAL LOW (ref 22–32)
Calcium: 9.4 mg/dL (ref 8.9–10.3)
Chloride: 107 mmol/L (ref 98–111)
Creatinine, Ser: 0.72 mg/dL (ref 0.44–1.00)
GFR calc Af Amer: >60 mL/min (ref >60)
GFR calc non Af Amer: >60 mL/min (ref >60)
Glucose, Bld: 263 mg/dL — ABNORMAL HIGH (ref 70–99)
Potassium: 4.2 mmol/L (ref 3.5–5.1)
Sodium: 136 mmol/L (ref 135–145)

## 2019-04-17 LAB — CBC WITH DIFFERENTIAL/PLATELET
Abs Immature Granulocytes: 0.34 10*3/uL — ABNORMAL HIGH (ref 0.00–0.07)
Basophils Absolute: 0.1 10*3/uL (ref 0.0–0.1)
Basophils Relative: 0 %
Eosinophils Absolute: 0 10*3/uL (ref 0.0–0.5)
Eosinophils Relative: 0 %
HCT: 39.1 % (ref 36.0–46.0)
Hemoglobin: 13 g/dL (ref 12.0–15.0)
Immature Granulocytes: 2 %
Lymphocytes Relative: 12 %
Lymphs Abs: 2 10*3/uL (ref 0.7–4.0)
MCH: 31.5 pg (ref 26.0–34.0)
MCHC: 33.2 g/dL (ref 30.0–36.0)
MCV: 94.7 fL (ref 80.0–100.0)
Monocytes Absolute: 0.2 10*3/uL (ref 0.1–1.0)
Monocytes Relative: 1 %
Neutro Abs: 14.4 10*3/uL — ABNORMAL HIGH (ref 1.7–7.7)
Neutrophils Relative %: 85 %
Platelets: 455 10*3/uL — ABNORMAL HIGH (ref 150–400)
RBC: 4.13 MIL/uL (ref 3.87–5.11)
RDW: 15.6 % — ABNORMAL HIGH (ref 11.5–15.5)
WBC: 17 10*3/uL — ABNORMAL HIGH (ref 4.0–10.5)
nRBC: 0 % (ref 0.0–0.2)

## 2019-04-17 LAB — C-REACTIVE PROTEIN: CRP: 1.2 mg/dL — ABNORMAL HIGH (ref <1.0)

## 2019-04-17 LAB — SEDIMENTATION RATE: Sed Rate: 9 mm/hr (ref 0–22)

## 2019-04-17 MED ORDER — HYDROMORPHONE HCL 1 MG/ML IJ SOLN
0.5000 mg | Freq: Once | INTRAMUSCULAR | Status: AC
  Administered 2019-04-17: 0.5 mg via INTRAVENOUS
  Filled 2019-04-17: qty 1

## 2019-04-17 MED ORDER — DOCUSATE SODIUM 100 MG PO CAPS: 100.0000 mg | ORAL_CAPSULE | Freq: Two times a day (BID) | ORAL | 0 refills | Status: DC

## 2019-04-17 MED ORDER — OXYCODONE-ACETAMINOPHEN 5-325 MG PO TABS
2.0000 | ORAL_TABLET | Freq: Once | ORAL | Status: AC
  Administered 2019-04-17: 2 via ORAL
  Filled 2019-04-17: qty 2

## 2019-04-17 MED ORDER — ONDANSETRON HCL 4 MG/2ML IJ SOLN
4.0000 mg | Freq: Once | INTRAMUSCULAR | Status: AC
  Administered 2019-04-17: 4 mg via INTRAVENOUS
  Filled 2019-04-17: qty 2

## 2019-04-17 MED ORDER — OXYCODONE-ACETAMINOPHEN 5-325 MG PO TABS: 1.0000 | ORAL_TABLET | Freq: Four times a day (QID) | ORAL | 0 refills | Status: DC | PRN

## 2019-04-17 NOTE — ED Notes (Signed)
Reports kicked at cat several weeks ago   L and R hip pain since   Had an injection to one side but not the other   Increased pain today without call to doctor   Here for eval   Reports steroids and oxycodone relieves her pain

## 2019-04-17 NOTE — ED Triage Notes (Signed)
Pt states her left hip has been hurting for past week after kicking at her cat for biting foot. Received Cortisone injection in SI and pain is now worse.

## 2019-04-17 NOTE — ED Notes (Signed)
Pt is in radiology.

## 2019-04-17 NOTE — ED Notes (Signed)
Pt labs drawn.

## 2019-04-17 NOTE — ED Notes (Signed)
Pt ambulates heel to toe without any change in gait to bathroom   Pt reports she spoke with her sister, a nurse who told her to take the xrays   PA informed   Pt given warm blanket light diminished for comfort

## 2019-04-17 NOTE — Discharge Instructions (Addendum)
Get help right away if: You have weakness, numbness, or tingling in your legs or feet. You lose control of your bladder or bowel.

## 2019-04-17 NOTE — ED Notes (Signed)
Pt refuses rad of back stating that they have already done one

## 2019-04-17 NOTE — ED Provider Notes (Signed)
[facility] EMERGENCY DEPARTMENT Provider Note   CSN: [id number] Arrival date & time: 04/17/19  1645     History Chief Complaint  Patient presents with  . Hip Pain    Stephanie Norris is a 64 y.o. female the past medical history of osteoarthritis and rheumatoid arthritis who is on methotrexate and follows with rheumatology who presents the emergency department with chief complaint of left hip and left SI joint pain.  She was seen in the emergency department 2 days ago for the same complaint.  She was given a shot of IM morphine and discharge.  Patient reports severe SI joint pain, pain with trying to lift her leg.  She has had intermittent sweating but denies fever, chills, vomiting.  Patient did have an SI joint injection several days ago by Dr. Estanislado Pandy  HPI     Past Medical History:  Diagnosis Date  . Asthma   . COPD (chronic obstructive pulmonary disease) ([place])   . Diabetes mellitus without complication (HCC)    Type 2  . Dizziness   . Fibromyalgia   . GERD (gastroesophageal reflux disease)   . Headache   . Hyperlipidemia   . OSA (obstructive sleep apnea)   . Osteoarthritis   . PCOS (polycystic ovarian syndrome)   . Raynaud disease   . Rheumatoid arthritis [facility])     Patient Active Problem List   Diagnosis Date Noted  . Vertigo 08/20/2016  . Eustachian tube dysfunction 04/17/2016  . Abdominal pain 03/02/2016  . Gastroenteritis 03/02/2016  . Rheumatoid arthritis with rheumatoid factor of multiple sites without organ or systems involvement ([place]) 01/13/2016  . HLA B27 (HLA B27 positive) 01/13/2016  . Osteoarthritis of foot 01/13/2016  . DJD (degenerative joint disease), cervical 01/13/2016  . Spondylosis of lumbar region without myelopathy or radiculopathy 01/13/2016  . Primary osteoarthritis of both hands 01/13/2016  . Non-seasonal allergic rhinitis 08/13/2014  . Stiffness of joints, not elsewhere classified, multiple sites 08/26/2013  . Flu-like symptoms  03/02/2013  . Lactic acidosis 03/02/2013  . Sinus tachycardia 03/02/2013  . Elevated lactic acid level 03/02/2013  . Influenza due to identified novel influenza A virus with other respiratory manifestations 03/02/2013  . Hyperglycemia 03/02/2013  . Fibromyalgia   . OSA (obstructive sleep apnea) 06/22/2011  . DOE (dyspnea on exertion) 05/30/2011  . Moderate persistent asthma 03/30/2011    Past Surgical History:  Procedure Laterality Date  . APPENDECTOMY    . CHOLECYSTECTOMY    . COLONOSCOPY W/ BIOPSIES  fall 2009  . COMBINED HYSTERECTOMY VAGINAL / OOPHORECTOMY / A&P REPAIR  10/2002   endometriosis, cystocele, fibroids  . KNEE SURGERY  1996  . TOE SURGERY    . VESICOVAGINAL FISTULA CLOSURE W/ TAH       OB History    Gravida  1   Para      Term      Preterm      AB  1   Living  0     SAB      TAB      Ectopic      Multiple      Live Births              Family History  Problem Relation Age of Onset  . Asthma Mother   . Rheum arthritis Mother   . Allergic rhinitis Mother   . Rheum arthritis Sister   . Allergic rhinitis Sister   . Heart failure Father   . Heart disease Father   .  Rheum arthritis Sister   . Allergic rhinitis Sister   . Allergic rhinitis Brother   . Allergies Other        "Everyone"  . Heart disease Maternal Grandfather   . Heart disease Maternal Grandmother   . Heart disease Paternal Grandfather   . Heart disease Paternal Grandmother     Social History   Tobacco Use  . Smoking status: Never Smoker  . Smokeless tobacco: Never Used  Substance Use Topics  . Alcohol use: No  . Drug use: No    Home Medications Prior to Admission medications   Medication Sig Start Date End Date Taking? Authorizing Provider  albuterol (PROVENTIL HFA;VENTOLIN HFA) 108 (90 Base) MCG/ACT inhaler Inhale 2 puffs into the lungs every 4 (four) hours as needed for wheezing or shortness of breath. 10/15/17   Bobbitt, Sedalia Muta, MD  Continuous Blood  Gluc Sensor ([place]) MISC as needed. 02/12/17   [provider]  diclofenac sodium (VOLTAREN) 1 % GEL Apply 3 grams to three large joints up to three times daily as needed 02/21/17   Ofilia Neas, PA-C  docusate sodium (COLACE) 100 MG capsule Take 1 capsule (100 mg total) by mouth every 12 (twelve) hours. 04/17/19   Armando Bukhari, Vernie Shanks, PA-C  DULoxetine (CYMBALTA) 60 MG capsule TAKE 1 CAPSULE BY MOUTH EVERY DAY 09/06/17   Bo Merino, MD  fluticasone (FLONASE) 50 MCG/ACT nasal spray Place 2 sprays into both nostrils daily. 10/15/17   Bobbitt, Sedalia Muta, MD  fluticasone furoate-vilanterol (BREO ELLIPTA) 200-25 MCG/INH AEPB Inhale 1 puff into the lungs daily. 10/15/17   Bobbitt, Sedalia Muta, MD  folic acid (FOLVITE) 1 MG tablet Take 2 tablets (2 mg total) by mouth daily. 03/23/16   Panwala, Naitik, PA-C  INVOKANA 300 MG TABS tablet 150 mg, patient states she takes 1/2 tab 05/22/18   [provider]  ipratropium (ATROVENT) 0.06 % nasal spray ipratropium bromide 42 mcg (0.06 %) nasal spray    [provider]  metFORMIN (GLUCOPHAGE-XR) 500 MG 24 hr tablet Take 1,000 mg by mouth 2 (two) times daily.  01/04/16   [provider]  methotrexate (RHEUMATREX) 2.5 MG tablet TAKE 6 TABLETS BY MOUTH ONCE WEEKLY. Caution:Chemotherapy. Protect from light. 01/09/19   Bo Merino, MD  montelukast (SINGULAIR) 10 MG tablet Take 1 tablet (10 mg total) by mouth at bedtime. 10/15/17   Bobbitt, Sedalia Muta, MD  Multiple Vitamins-Minerals (MULTIVITAMIN PO) Take by mouth.    [provider]  omega-3 acid ethyl esters (LOVAZA) 1 g capsule Take 2 capsules by mouth 2 (two) times daily. 03/11/18   [provider]  ONETOUCH VERIO test strip  01/04/16   [provider]  oxyCODONE-acetaminophen (PERCOCET) 5-325 MG tablet Take 1-2 tablets by mouth every 6 (six) hours as needed. 04/17/19   Chelbie Jarnagin, Vernie Shanks, PA-C  pantoprazole (PROTONIX) 40 MG tablet  Take 1 tablet by mouth 2 (two) times daily.  07/14/14   [provider]  predniSONE (STERAPRED UNI-PAK 21 TAB) 10 MG (21) TBPK tablet Pred-pak 60m tablets with 6 day taper. Use as directed 04/16/19   STruddie Hidden MD  valACYclovir (VALTREX) 1000 MG tablet SMARTSIG:2 Tablet(s) By Mouth Every 12 Hours PRN 11/12/18   [provider]    Allergies    Nsaids, Red dye, Bactrim [sulfamethoxazole-trimethoprim], Cephalexin, Levofloxacin, Macrobid  [nitrofurantoin], Nitrofurantoin monohyd macro, and Other  Review of Systems   Review of Systems Ten systems reviewed and are negative for acute change, except as noted  in the HPI.   Physical Exam Updated Vital Signs BP 114/74 (BP Location: Right Arm)   Pulse 95   Temp 97.9 F (36.6 C) (Oral)   Resp 16   Ht _0  (1.499 m)   Wt 54.4 kg   SpO2 100%   BMI 24.24 kg/m   Physical Exam Vitals and nursing note reviewed.  Constitutional:      General: She is not in acute distress.    Appearance: She is well-developed. She is not diaphoretic.  HENT:     Head: Normocephalic and atraumatic.  Eyes:     General: No scleral icterus.    Conjunctiva/sclera: Conjunctivae normal.  Cardiovascular:     Rate and Rhythm: Normal rate and regular rhythm.     Heart sounds: Normal heart sounds. No murmur. No friction rub. No gallop.   Pulmonary:     Effort: Pulmonary effort is normal. No respiratory distress.     Breath sounds: Normal breath sounds.  Abdominal:     General: Bowel sounds are normal. There is no distension.     Palpations: Abdomen is soft. There is no mass.     Tenderness: There is no abdominal tenderness. There is no guarding.  Musculoskeletal:     Cervical back: Normal range of motion.     Comments: Pain to palpation over the Left SI joint.  no heat, redness or swelling.  Patient is able to range the joint but has severe pain.  She is ambulatory.  Skin:    General: Skin is warm and dry.  Neurological:     Mental  Status: She is alert and oriented to person, place, and time.  Psychiatric:        Behavior: Behavior normal.     ED Results / Procedures / Treatments   Labs (all labs ordered are listed, but only abnormal results are displayed) Labs Reviewed  CBC WITH DIFFERENTIAL/PLATELET - Abnormal; Notable for the following components:      Result Value   WBC 17.0 (*)    RDW 15.6 (*)    Platelets 455 (*)    Neutro Abs 14.4 (*)    Abs Immature Granulocytes 0.34 (*)    All other components within normal limits  BASIC METABOLIC PANEL - Abnormal; Notable for the following components:   CO2 18 (*)    Glucose, Bld 263 (*)    BUN 37 (*)    All other components within normal limits  C-REACTIVE PROTEIN - Abnormal; Notable for the following components:   CRP 1.2 (*)    All other components within normal limits  SEDIMENTATION RATE    EKG None  Radiology DG Lumbar Spine 2-3 Views  Result Date: 04/16/2019 CLINICAL DATA:  Low back pain since Monday. EXAM: LUMBAR SPINE - 2-3 VIEW COMPARISON:  Jun 27, 2012 FINDINGS: There is no acute fracture or dislocation. Decreased intervertebral space is identified at L5-S1. Minimal anterior osteophytosis is identified at L3 and L4. IMPRESSION: No acute fracture or dislocation. Degenerative joint changes of lumbar spine. Electronically Signed   By: Abelardo Diesel M.D.   On: 04/16/2019 08:44   DG Sacrum/Coccyx  Result Date: 04/17/2019 CLINICAL DATA:  Hip pain for 1 week EXAM: SACRUM AND COCCYX - 2+ VIEW COMPARISON:  None. FINDINGS: Pelvic ring is intact. Sacral ala are unremarkable. Sacroiliac joints are patent bilaterally. No soft tissue abnormality is noted. IMPRESSION: No acute abnormality noted. Electronically Signed   By: Inez Catalina M.D.   On: 04/17/2019 19:50   DG  HIP UNILAT WITH PELVIS 2-3 VIEWS LEFT  Result Date: 04/17/2019 CLINICAL DATA:  Left hip pain for 1 week. EXAM: DG HIP (WITH OR WITHOUT PELVIS) 2-3V LEFT COMPARISON:  None. FINDINGS: The cortical  margins of the bony pelvis and left hip are intact. No fracture. Pubic symphysis and sacroiliac joints are congruent. Both femoral heads are well-seated in the respective acetabula. IMPRESSION: Negative radiographs of the pelvis and left hip. Electronically Signed   By: Keith Rake M.D.   On: 04/17/2019 19:51    Procedures Procedures (including critical care time)  Medications Ordered in ED Medications  HYDROmorphone (DILAUDID) injection 0.5 mg (0.5 mg Intravenous Given 04/17/19 1940)  ondansetron (ZOFRAN) injection 4 mg (4 mg Intravenous Given 04/17/19 1940)  oxyCODONE-acetaminophen (PERCOCET/ROXICET) 5-325 MG per tablet 2 tablet (2 tablets Oral Given 04/17/19 2111)    ED Course  I have reviewed the triage vital signs and the nursing notes.  Pertinent labs & imaging results that were available during my care of the patient were reviewed by me and considered in my medical decision making (see chart for details).    MDM Rules/Calculators/A&P                     This is a 64 year old female here with left SI joint pain she had an injection just a few weeks ago pain worsened.  CRP is within normal limits so is the patient's sed rate.  She does have an elevated white blood cell count as compared to previous however this is likely an acute phase reaction.  BMP is within normal limits however she does have elevated blood glucose.  I personally reviewed the patient's hip and sacrum x-ray which showed no acute abnormalities on my interpretation.  Patient is very worried about having pain.  She states that she came here because she "wants something to take it away."  And that she needs an MRI.  I had a long discussion with her about goals of care from the emergency department which include ruling out life limb or organ threatening illness.  I do not believe that she has septic joint.  The patient was able to ambulate to the bathroom here.  I feel that she would likely do well from pain control and  outpatient follow-up.  I reiterated this point directly to the patient and explained that she should manage her goals to include improved pain control that will allow her to ambulate and get around the house better but that a goal of no pain is not possible.  Patient otherwise appears appropriate for discharge and may follow-up with her rheumatologist this coming Monday. PDMP reviewed during this encounter.  Final Clinical Impression(s) / ED Diagnoses Final diagnoses:  Hip pain  SI (sacroiliac) pain    Rx / DC Orders ED Discharge Orders         Ordered    oxyCODONE-acetaminophen (PERCOCET) 5-325 MG tablet  Every 6 hours PRN     04/17/19 2048    docusate sodium (COLACE) 100 MG capsule  Every 12 hours     04/17/19 2048           Margarita Mail, PA-C 04/17/19 2201    Lucrezia Starch, MD 04/21/19 2357

## 2019-04-20 ENCOUNTER — Ambulatory Visit: Payer: PPO | Admitting: Family Medicine

## 2019-04-20 ENCOUNTER — Encounter: Payer: Self-pay | Admitting: Family Medicine

## 2019-04-20 ENCOUNTER — Other Ambulatory Visit: Payer: Self-pay

## 2019-04-20 ENCOUNTER — Telehealth: Payer: Self-pay | Admitting: *Deleted

## 2019-04-20 ENCOUNTER — Ambulatory Visit: Payer: Self-pay

## 2019-04-20 DIAGNOSIS — M25552 Pain in left hip: Secondary | ICD-10-CM | POA: Diagnosis not present

## 2019-04-20 DIAGNOSIS — M533 Sacrococcygeal disorders, not elsewhere classified: Secondary | ICD-10-CM | POA: Diagnosis not present

## 2019-04-20 MED ORDER — HYDROCODONE-ACETAMINOPHEN 5-325 MG PO TABS: 1.0000 | ORAL_TABLET | Freq: Four times a day (QID) | ORAL | 0 refills | Status: DC | PRN

## 2019-04-20 MED ORDER — BACLOFEN 10 MG PO TABS: 5.0000 mg | ORAL_TABLET | Freq: Three times a day (TID) | ORAL | 3 refills | Status: DC | PRN

## 2019-04-20 NOTE — Telephone Encounter (Signed)
Stephanie Merino, Stephanie Norris  Stephanie Binning, LPN  Please call to check how patient is doing. She went to the emergency room on March 11 and March 12. If she is still having ongoing pain then she will need developed referral to orthopedics.  Stephanie Merino, Stephanie Norris    Patient states she is still having pain. Patient states she is currently on her way to the orthopedic now.

## 2019-04-20 NOTE — Progress Notes (Signed)
Office Visit Note   Patient: Stephanie Norris           Date of Birth: [birth year]           MRN: [id number] Visit Date: 04/20/2019 Requested by: Celene Squibb, MD [address] [place],  [place] [address] PCP: Celene Squibb, MD  Subjective: Chief Complaint  Patient presents with  . Left Hip - Pain    HPI: She is here with right posterior lateral hip pain.  About a week ago she was standing and her cat was biting at her, she kicked backward at it with her left foot.  She felt immediate severe pain on the posterior lateral aspect of her hip.  Pain was not improving so she went to Dr. Kathee Delton whom she sees for rheumatoid arthritis.  She was given an SI joint injection but apparently it did not help, the next day she went to the ER where she was given pain medicine.  She had x-rays obtained as well which were unrevealing, I reviewed those myself.  The following day she was still in severe pain so she went back to the ER.  She now presents for additional evaluation.  No radicular symptoms.  No history of stress fractures.  Denies any groin pain.              ROS: No fevers or chills.  All other systems were reviewed and are negative.  Objective: Vital Signs: There were no vitals taken for this visit.  Physical Exam:  General:  Alert and oriented, in no acute distress. Pulm:  Breathing unlabored. Psy:  Normal mood, congruent affect. Skin: No rash. Low back: She is point tender over the left SI joint.  This seems to reproduce most of her pain.  She is also tender on the posterior aspect of the greater trochanter.  There is no significant tenderness in between those areas.  No pain with internal hip rotation.  Lower extremity strength and reflexes are normal..  Imaging: None other than for needle guidance  Assessment & Plan: 1.  Persistent left posterior hip pain, possibilities include sacral insufficiency fracture, gluteus muscle tear, SI joint dysfunction. -Discussed options with her  and recommended proceeding with MRI scan of the pelvis. -She states she is in severe pain and wants something done today as well, so I offered to reinject her SI joint under ultrasound guidance and also do a palpation guided greater trochanter injection. -Hydrocodone and baclofen given for her symptoms.     Procedures: Procedure: Ultrasound-guided left SI injection: After sterile prep with Betadine, injected 5 cc 1% lidocaine without epinephrine and 20 mg methylprednisolone using a 22-gauge spinal needle, passing the needle through the sacroiliac ligament into the region of the SI joint.  Also did greater trochanter injection with 4 cc 1% lido and 20 mg methylprednisolone.    PMFS History: Patient Active Problem List   Diagnosis Date Noted  . Vertigo 08/20/2016  . Eustachian tube dysfunction 04/17/2016  . Abdominal pain 03/02/2016  . Gastroenteritis 03/02/2016  . Rheumatoid arthritis with rheumatoid factor of multiple sites without organ or systems involvement ([place]) 01/13/2016  . HLA B27 (HLA B27 positive) 01/13/2016  . Osteoarthritis of foot 01/13/2016  . DJD (degenerative joint disease), cervical 01/13/2016  . Spondylosis of lumbar region without myelopathy or radiculopathy 01/13/2016  . Primary osteoarthritis of both hands 01/13/2016  . Non-seasonal allergic rhinitis 08/13/2014  . Stiffness of joints, not elsewhere classified, multiple sites 08/26/2013  .  Flu-like symptoms 03/02/2013  . Lactic acidosis 03/02/2013  . Sinus tachycardia 03/02/2013  . Elevated lactic acid level 03/02/2013  . Influenza due to identified novel influenza A virus with other respiratory manifestations 03/02/2013  . Hyperglycemia 03/02/2013  . Fibromyalgia   . OSA (obstructive sleep apnea) 06/22/2011  . DOE (dyspnea on exertion) 05/30/2011  . Moderate persistent asthma 03/30/2011   Past Medical History:  Diagnosis Date  . Asthma   . COPD (chronic obstructive pulmonary disease) ([place])   . Diabetes  mellitus without complication (HCC)    Type 2  . Dizziness   . Fibromyalgia   . GERD (gastroesophageal reflux disease)   . Headache   . Hyperlipidemia   . OSA (obstructive sleep apnea)   . Osteoarthritis   . PCOS (polycystic ovarian syndrome)   . Raynaud disease   . Rheumatoid arthritis ([place])     Family History  Problem Relation Age of Onset  . Asthma Mother   . Rheum arthritis Mother   . Allergic rhinitis Mother   . Rheum arthritis Sister   . Allergic rhinitis Sister   . Heart failure Father   . Heart disease Father   . Rheum arthritis Sister   . Allergic rhinitis Sister   . Allergic rhinitis Brother   . Allergies Other        "Everyone"  . Heart disease Maternal Grandfather   . Heart disease Maternal Grandmother   . Heart disease Paternal Grandfather   . Heart disease Paternal Grandmother     Past Surgical History:  Procedure Laterality Date  . APPENDECTOMY    . CHOLECYSTECTOMY    . COLONOSCOPY W/ BIOPSIES  fall 2009  . COMBINED HYSTERECTOMY VAGINAL / OOPHORECTOMY / A&P REPAIR  10/2002   endometriosis, cystocele, fibroids  . KNEE SURGERY  1996  . TOE SURGERY    . VESICOVAGINAL FISTULA CLOSURE W/ TAH     Social History   Occupational History  . Occupation: Patient Account Rep    Employer: [employer]  Tobacco Use  . Smoking status: Never Smoker  . Smokeless tobacco: Never Used  Substance and Sexual Activity  . Alcohol use: No  . Drug use: No  . Sexual activity: Not on file

## 2019-04-25 ENCOUNTER — Ambulatory Visit (HOSPITAL_BASED_OUTPATIENT_CLINIC_OR_DEPARTMENT_OTHER)
Admission: RE | Admit: 2019-04-25 | Discharge: 2019-04-25 | Disposition: A | Discharge status: Home / Self Care | Payer: PPO | Source: Ambulatory Visit | Attending: Family Medicine | Admitting: Family Medicine

## 2019-04-25 ENCOUNTER — Other Ambulatory Visit: Payer: Self-pay

## 2019-04-25 DIAGNOSIS — M533 Sacrococcygeal disorders, not elsewhere classified: Secondary | ICD-10-CM | POA: Diagnosis not present

## 2019-04-27 ENCOUNTER — Telehealth: Payer: Self-pay | Admitting: Family Medicine

## 2019-04-27 DIAGNOSIS — M533 Sacrococcygeal disorders, not elsewhere classified: Secondary | ICD-10-CM

## 2019-04-27 DIAGNOSIS — M545 Low back pain, unspecified: Secondary | ICD-10-CM

## 2019-04-27 NOTE — Telephone Encounter (Signed)
I called and advised the patient of her MRI results. She said the pain is all on her left side, not the right. She cannot sit or lie with any weight on the left side. The patient would like to proceed with the ESI, but at [place], as they may be able to get her in sooner than Dr. Ernestina Patches (they are scheduling 3 weeks out). If it is still going to be a while before she can get in for an ESI there, the patient said she will call back if she would like to proceed with PT at that point.

## 2019-04-27 NOTE — Addendum Note (Signed)
Addended by: Hortencia Pilar on: 04/27/2019 01:05 PM   Modules accepted: Orders

## 2019-04-27 NOTE — Telephone Encounter (Signed)
Patient called.   She wanted to know if her results were received so she can go ahead her schedule her MRI review.   Call back: [phone]

## 2019-04-27 NOTE — Telephone Encounter (Signed)
Orders placed for ESI at GI, and also PT at [facility].

## 2019-04-27 NOTE — Telephone Encounter (Signed)
I called the patient to let her know the results are not available yet in the chart. The injections helped a little bit, but she is still in much pain. I told her we would call with results once they're available and Dr. Junius Roads has reviewed them (no need for ov for this). We should have them later today or tomorrow.

## 2019-04-27 NOTE — Telephone Encounter (Signed)
Left message on voice mail to call back for MRI results.

## 2019-04-27 NOTE — Telephone Encounter (Signed)
I advised the patient she will be getting calls from [place] and [facility] regarding appointments. She may hold off on the PT, if she chooses, but can at least see how far out they are scheduling patients.

## 2019-04-27 NOTE — Telephone Encounter (Signed)
MRI shows normal appearance of the tailbone and SI joints.  The L3-4 disc appears to be protruding, but it's toward the left side.   Her symptoms seemed worse on the right, so this may not be causing her any trouble.  If still in pain, could either try physical therapy, or else refer her to Dr. Ernestina Patches for a one-time lumbar spine epidural steroid injection.

## 2019-04-27 NOTE — Telephone Encounter (Signed)
Error

## 2019-04-28 ENCOUNTER — Other Ambulatory Visit: Payer: Self-pay | Admitting: Family Medicine

## 2019-04-28 DIAGNOSIS — M545 Low back pain, unspecified: Secondary | ICD-10-CM

## 2019-04-30 ENCOUNTER — Ambulatory Visit
Admission: RE | Admit: 2019-04-30 | Discharge: 2019-04-30 | Disposition: A | Discharge status: Home / Self Care | Payer: PPO | Source: Ambulatory Visit | Attending: Family Medicine | Admitting: Family Medicine

## 2019-04-30 DIAGNOSIS — M545 Low back pain, unspecified: Secondary | ICD-10-CM

## 2019-04-30 MED ORDER — METHYLPREDNISOLONE ACETATE 40 MG/ML INJ SUSP (RADIOLOG
120.0000 mg | Freq: Once | INTRAMUSCULAR | Status: AC
  Administered 2019-04-30: 120 mg via EPIDURAL

## 2019-04-30 MED ORDER — IOPAMIDOL (ISOVUE-M 200) INJECTION 41%
1.0000 mL | Freq: Once | INTRAMUSCULAR | Status: AC
  Administered 2019-04-30: 10:00:00 1 mL via EPIDURAL

## 2019-04-30 NOTE — Discharge Instructions (Signed)

## 2019-05-04 ENCOUNTER — Ambulatory Visit (HOSPITAL_COMMUNITY): Payer: PPO | Attending: Family Medicine | Admitting: Physical Therapy

## 2019-05-04 ENCOUNTER — Encounter (HOSPITAL_COMMUNITY): Payer: Self-pay | Admitting: Physical Therapy

## 2019-05-04 ENCOUNTER — Other Ambulatory Visit: Payer: Self-pay

## 2019-05-04 DIAGNOSIS — M5442 Lumbago with sciatica, left side: Secondary | ICD-10-CM | POA: Insufficient documentation

## 2019-05-04 DIAGNOSIS — M6281 Muscle weakness (generalized): Secondary | ICD-10-CM | POA: Diagnosis not present

## 2019-05-04 DIAGNOSIS — R262 Difficulty in walking, not elsewhere classified: Secondary | ICD-10-CM | POA: Diagnosis not present

## 2019-05-04 NOTE — Therapy (Signed)
[place], [place], [address] Phone: [phone]   Fax:  [phone]  Physical Therapy Evaluation  Patient Details  Name: Stephanie Norris MRN: [id number] Date of Birth: Mar 20, 1955 Referring Provider (PT): Eunice Blase   Encounter Date: 05/04/2019  PT End of Session - 05/04/19 1051    Visit Number  1    Number of Visits  12    Date for PT Re-Evaluation  06/15/19    Authorization Type  visits based on med nec. $15 co pay, no auth req.    Authorization Time Period  POC dates 05/04/19 - 06/15/2019    Authorization - Visit Number  --    Progress Note Due on Visit  10    PT Start Time  1051   pt late to session   PT Stop Time  1130    PT Time Calculation (min)  39 min    Activity Tolerance  Patient limited by pain    Behavior During Therapy  [facility] for tasks assessed/performed       Past Medical History:  Diagnosis Date  . Asthma   . COPD (chronic obstructive pulmonary disease) ([place])   . Diabetes mellitus without complication (HCC)    Type 2  . Dizziness   . Fibromyalgia   . GERD (gastroesophageal reflux disease)   . Headache   . Hyperlipidemia   . OSA (obstructive sleep apnea)   . Osteoarthritis   . PCOS (polycystic ovarian syndrome)   . Raynaud disease   . Rheumatoid arthritis [facility])     Past Surgical History:  Procedure Laterality Date  . APPENDECTOMY    . CHOLECYSTECTOMY    . COLONOSCOPY W/ BIOPSIES  fall 2009  . COMBINED HYSTERECTOMY VAGINAL / OOPHORECTOMY / A&P REPAIR  10/2002   endometriosis, cystocele, fibroids  . KNEE SURGERY  1996  . TOE SURGERY    . VESICOVAGINAL FISTULA CLOSURE W/ TAH      There were no vitals filed for this visit.   Subjective Assessment - 05/04/19 1057    Subjective  Patient complains of excruciating low back pain radiating into her left hip and into the lateral side and front of her left leg. Pain started about a month ago after she brought her left leg back to kick  her cat which was biting her at the time. Pain is so bad she can barely stand and walk and get tasks done around the house. Overall the symptoms have improved a little bit since her injection but she is still in a lot of pain. She had pain prior to this episode but nothing like this and she was able to push through her pain to get things done, now she can't. States she is currently using a cane but was no prior to her injury.    Pertinent History  DB, fibromyalgia, RA, COPD    Limitations  Sitting;Lifting;Standing;Walking;House hold activities    How long can you stand comfortably?  1 minute    Patient Stated Goals  to be able to do her tasks required during the day    Currently in Pain?  Yes    Pain Score  5     Pain Location  Back    Pain Descriptors / Indicators  Tightness;Aching    Pain Type  Acute pain    Pain Radiating Towards  towards the leg hip and leg         [facility] PT Assessment - 05/04/19 0001  Assessment   Medical Diagnosis  LBP and SIJ pain     Referring Provider (PT)  Eunice Blase    Prior Therapy  yes for LBP in 2015      Precautions   Precautions  None      Restrictions   Weight Bearing Restrictions  No      Balance Screen   Has the patient fallen in the past 6 months  No    Has the patient had a decrease in activity level because of a fear of falling?   Yes    Is the patient reluctant to leave their home because of a fear of falling?   No      Home Environment   Living Environment  Private residence    Available Help at Discharge  Family      Prior Function   Level of Independence  Independent      Cognition   Overall Cognitive Status  Within Functional Limits for tasks assessed      Observation/Other Assessments   Focus on Therapeutic Outcomes (FOTO)   67% limited      ROM / Strength   AROM / PROM / Strength  AROM;Strength      AROM   AROM Assessment Site  Lumbar;Hip    Right/Left Hip  Right;Left    Right Hip Flexion  120   pain in left low  back    Right Hip External Rotation   60    Right Hip Internal Rotation   60    Left Hip Flexion  120    Left Hip External Rotation   45   excessive pelvic rotation to compensate - pain in back    Left Hip Internal Rotation   60    Lumbar Flexion  75% limited   coming up [place] on the left buttocks   Lumbar Extension  100% limited   pull at center of back    Lumbar - Right Side Bend  50% limited     Lumbar - Left Side Bend  75% limited   pulls in the back on the left side   Lumbar - Right Rotation  75% limited   pulls on mid back    Lumbar - Left Rotation  75% limited   hurts on right mid back      Strength   Strength Assessment Site  Hip;Knee    Right/Left Hip  Left;Right    Right/Left Knee  Left;Right      Palpation   Spinal mobility  hypomobility noted throughout lumbar spine - unable to tolerate more than light pressure.    SI assessment   left more posterior than right. tenderness around joint line - favors left hip flexion and right hip extension     Palpation comment  tenderness noted in bilateral lumbar paraspinals, glutes and piriformis with left worse than right.        Special Tests   Other special tests  ely's test -pulling L>R       Ambulation/Gait   Ambulation/Gait  Yes    Ambulation/Gait Assistance  6: Modified independent (Device/Increase time)    Assistive device  Straight cane    Gait Pattern  Decreased step length - left;Decreased step length - right;Decreased hip/knee flexion - left;Decreased hip/knee flexion - right;Trunk flexed    Ambulation Surface  Level;Indoor    Gait velocity  decreased     Gait Comments  wasn't walking with cane prior to injury  Objective measurements completed on examination: See above findings.      [place] Adult PT Treatment/Exercise - 05/04/19 0001      Exercises   Exercises  Lumbar      Lumbar Exercises: Supine   Other Supine Lumbar Exercises  90/90 supported position - left hip flexion iso and  right hip ext iso the hip add/abd x5 5" holds E             PT Education - 05/04/19 1159    Education Details  educated patient on current condition, findings and plan moving forward with PT    Person(s) Educated  Patient    Methods  Explanation    Comprehension  Verbalized understanding       PT Short Term Goals - 05/04/19 1152      PT SHORT TERM GOAL #1   Title  Patient will be independent in HEP to improve functional outcomes.    Time  3    Period  Weeks    Status  New    Target Date  05/25/19      PT SHORT TERM GOAL #2   Title  Patient will report at least 25% improvement in overall symptoms and/or functional ability.    Time  3    Period  Weeks    Status  New    Target Date  05/25/19        PT Long Term Goals - 05/04/19 1153      PT LONG TERM GOAL #1   Title  Patient will score with <51%  limitation on foto to demonstrate improved functional mobility    Baseline  3/29 67% limitation    Time  6    Period  Weeks    Status  New    Target Date  06/15/19      PT LONG TERM GOAL #2   Title  Patient wil lbe able to ambulate without cane to return to prior level of function.    Time  6    Period  Weeks    Status  New    Target Date  06/15/19      PT LONG TERM GOAL #3   Title  Patient will report at least 50% improvement in overall symptoms and/or functional ability    Time  6    Period  Weeks    Status  New    Target Date  06/15/19             Plan - 05/04/19 1201    Clinical Impression Statement  Patient presents to therapy with recent exacerbation of low back pain that radiates down left lower leg. Slight improvement in symptoms noted with muscle energy techniques performed today. Will continue to investigate possible sacroiliac joint involvement during future sessions. Patient severely limited in current functional mobility secondary to pain would greatly benefit from skilled physical therapy to improve overall mobility and return patient to prior  level of function.    Personal Factors and Comorbidities  Age;Comorbidity 1;Comorbidity 2;Comorbidity 3+    Comorbidities  RA, fibromyalgia, DB, COPD    Examination-Activity Limitations  Stand;Squat;Sit;Sleep;Bathing;Bed Mobility;Bend;Stairs;Carry;Toileting;Transfers;Hygiene/Grooming;Lift;Locomotion Level    Examination-Participation Restrictions  Cleaning;Shop;Driving;Community Activity;Meal Prep    Stability/Clinical Decision Making  Evolving/Moderate complexity    Clinical Decision Making  Moderate    Rehab Potential  Good    PT Frequency  2x / week    PT Duration  6 weeks    PT Treatment/Interventions  ADLs/Self Care Home Management;Aquatic Therapy;Biofeedback;Cryotherapy;Dealer  Stimulation;Moist Heat;Traction;Balance training;Therapeutic exercise;Therapeutic activities;Functional mobility training;Stair training;Gait training;DME Instruction;Neuromuscular re-education;Cognitive remediation;Patient/family education;Manual techniques;Dry needling;Joint Manipulations;Passive range of motion;Spinal Manipulations    PT Next Visit Plan  continued assessment of SIJ, (MMT)/ mobilizations, pelvic mobility, stretches (prifiromis), hip mobility    PT Home Exercise Plan  initiate next session    Consulted and Agree with Plan of Care  Patient       Patient will benefit from skilled therapeutic intervention in order to improve the following deficits and impairments:  Abnormal gait, Decreased endurance, Decreased balance, Decreased mobility, Difficulty walking, Decreased cognition, Decreased range of motion, Decreased coordination, Improper body mechanics, Decreased activity tolerance, Decreased strength, Pain, Hypomobility  Visit Diagnosis: Acute midline low back pain with left-sided sciatica  Muscle weakness (generalized)  Difficulty in walking, not elsewhere classified     Problem List Patient Active Problem List   Diagnosis Date Noted  . Vertigo 08/20/2016  . Eustachian tube  dysfunction 04/17/2016  . Abdominal pain 03/02/2016  . Gastroenteritis 03/02/2016  . Rheumatoid arthritis with rheumatoid factor of multiple sites without organ or systems involvement ([facility]) 01/13/2016  . HLA B27 (HLA B27 positive) 01/13/2016  . Osteoarthritis of foot 01/13/2016  . DJD (degenerative joint disease), cervical 01/13/2016  . Spondylosis of lumbar region without myelopathy or radiculopathy 01/13/2016  . Primary osteoarthritis of both hands 01/13/2016  . Non-seasonal allergic rhinitis 08/13/2014  . Stiffness of joints, not elsewhere classified, multiple sites 08/26/2013  . Flu-like symptoms 03/02/2013  . Lactic acidosis 03/02/2013  . Sinus tachycardia 03/02/2013  . Elevated lactic acid level 03/02/2013  . Influenza due to identified novel influenza A virus with other respiratory manifestations 03/02/2013  . Hyperglycemia 03/02/2013  . Fibromyalgia   . OSA (obstructive sleep apnea) 06/22/2011  . DOE (dyspnea on exertion) 05/30/2011  . Moderate persistent asthma 03/30/2011   12:40 PM, 05/04/19 Jerene Pitch, DPT Physical Therapy with [facility]  [phone] office  [place] [address] [place], [place], [address] Phone: [phone]   Fax:  [phone]  Name: Stephanie Norris MRN: [id number] Date of Birth: [birth year]

## 2019-05-05 ENCOUNTER — Telehealth: Payer: Self-pay | Admitting: Family Medicine

## 2019-05-05 DIAGNOSIS — F331 Major depressive disorder, recurrent, moderate: Secondary | ICD-10-CM | POA: Diagnosis not present

## 2019-05-05 DIAGNOSIS — E1142 Type 2 diabetes mellitus with diabetic polyneuropathy: Secondary | ICD-10-CM | POA: Diagnosis not present

## 2019-05-05 DIAGNOSIS — E119 Type 2 diabetes mellitus without complications: Secondary | ICD-10-CM | POA: Diagnosis not present

## 2019-05-05 DIAGNOSIS — E1140 Type 2 diabetes mellitus with diabetic neuropathy, unspecified: Secondary | ICD-10-CM | POA: Diagnosis not present

## 2019-05-05 DIAGNOSIS — E282 Polycystic ovarian syndrome: Secondary | ICD-10-CM | POA: Diagnosis not present

## 2019-05-05 DIAGNOSIS — E781 Pure hyperglyceridemia: Secondary | ICD-10-CM | POA: Diagnosis not present

## 2019-05-05 DIAGNOSIS — N390 Urinary tract infection, site not specified: Secondary | ICD-10-CM | POA: Diagnosis not present

## 2019-05-05 DIAGNOSIS — B009 Herpesviral infection, unspecified: Secondary | ICD-10-CM | POA: Diagnosis not present

## 2019-05-05 DIAGNOSIS — F330 Major depressive disorder, recurrent, mild: Secondary | ICD-10-CM | POA: Diagnosis not present

## 2019-05-05 DIAGNOSIS — E7849 Other hyperlipidemia: Secondary | ICD-10-CM | POA: Diagnosis not present

## 2019-05-05 DIAGNOSIS — B373 Candidiasis of vulva and vagina: Secondary | ICD-10-CM | POA: Diagnosis not present

## 2019-05-05 DIAGNOSIS — E782 Mixed hyperlipidemia: Secondary | ICD-10-CM | POA: Diagnosis not present

## 2019-05-05 DIAGNOSIS — E1121 Type 2 diabetes mellitus with diabetic nephropathy: Secondary | ICD-10-CM | POA: Diagnosis not present

## 2019-05-05 MED ORDER — HYDROCODONE-ACETAMINOPHEN 5-325 MG PO TABS: 1.0000 | ORAL_TABLET | Freq: Two times a day (BID) | ORAL | 0 refills | Status: DC | PRN

## 2019-05-05 NOTE — Telephone Encounter (Signed)
Patient called to let Dr. Junius Roads know that her back is starting to [place] again. Patient said it is hurting pretty bad.  Patient also need Rx refilled (Hydrocodone) The number to contact patient is [phone]

## 2019-05-05 NOTE — Telephone Encounter (Signed)
I called and advised the patient. She voiced understanding. 

## 2019-05-05 NOTE — Telephone Encounter (Signed)
I called the patient: she had her ESI at [place] last Thursday. Was feeling better - could actually sit down without the pain into her hips. She went to PT yesterday morning and then to [facility] afterward, where she was pushing a cart. She said she thinks she "overdid it" because the pain has returned, same as before. She is asking for a refill on her hydrocodone (she has a few left, but does not want to run out over the holiday weekend). Her next PT session is at 9:15 in the morning. Is this return of pain anything to worry about, or should this get better with more PT and refrain from long excursions?

## 2019-05-05 NOTE — Telephone Encounter (Signed)
Rx sent.  It's not unusual to have flare-ups like that, but hopefully they'll be fewer and fewer.

## 2019-05-06 ENCOUNTER — Ambulatory Visit (HOSPITAL_COMMUNITY): Payer: PPO | Admitting: Physical Therapy

## 2019-05-06 ENCOUNTER — Other Ambulatory Visit: Payer: Self-pay

## 2019-05-06 DIAGNOSIS — M5442 Lumbago with sciatica, left side: Secondary | ICD-10-CM

## 2019-05-06 DIAGNOSIS — M6281 Muscle weakness (generalized): Secondary | ICD-10-CM

## 2019-05-06 DIAGNOSIS — R262 Difficulty in walking, not elsewhere classified: Secondary | ICD-10-CM

## 2019-05-06 NOTE — Patient Instructions (Signed)
Access Code: [email or web address] URL: [email or web address] Date: 05/06/2019 Prepared by: Yetta Glassman  Exercises Supine Lower Trunk Rotation - 1 x daily - 7 x weekly - 2 sets - 10 reps - 5 hold Supine Bridge - 1 x daily - 7 x weekly - 4 sets - 5 reps - 5 hold 90/90 SI Joint Self-Correction - 1 x daily - 7 x weekly - 1 sets - 10 reps - 5 hold Supine Figure 4 Piriformis Stretch - 1 x daily - 7 x weekly - 2 sets - 5 reps - 5 hold Supine Piriformis Stretch with Foot on Ground - 1 x daily - 7 x weekly - 2 sets - 5 reps - 5 hold Supine Hip Adduction Isometric with Ball - 1 x daily - 7 x weekly - 2 sets - 10 reps - 5 hold

## 2019-05-06 NOTE — Therapy (Signed)
[place]phanie [place], [place], [address] Phone: [phone]   Fax:  [phone]  Physical Therapy Treatment  Patient Details  Name: Stephanie Norris MRN: [id number] Date of Birth: 12-24-1955 Referring Provider (PT): Eunice Blase   Encounter Date: 05/06/2019  PT End of Session - 05/06/19 1011    Visit Number  2    Number of Visits  12    Date for PT Re-Evaluation  06/15/19    Authorization Type  visits based on med nec. $15 co pay, no auth req.    Authorization Time Period  POC dates 05/04/19 - 06/15/2019    Progress Note Due on Visit  10    PT Start Time  0922    PT Stop Time  1010    PT Time Calculation (min)  48 min    Activity Tolerance  Patient limited by pain    Behavior During Therapy  [facility] for tasks assessed/performed       Past Medical History:  Diagnosis Date  . Asthma   . COPD (chronic obstructive pulmonary disease) ([place])   . Diabetes mellitus without complication (HCC)    Type 2  . Dizziness   . Fibromyalgia   . GERD (gastroesophageal reflux disease)   . Headache   . Hyperlipidemia   . OSA (obstructive sleep apnea)   . Osteoarthritis   . PCOS (polycystic ovarian syndrome)   . Raynaud disease   . Rheumatoid arthritis [facility])     Past Surgical History:  Procedure Laterality Date  . APPENDECTOMY    . CHOLECYSTECTOMY    . COLONOSCOPY W/ BIOPSIES  fall 2009  . COMBINED HYSTERECTOMY VAGINAL / OOPHORECTOMY / A&P REPAIR  10/2002   endometriosis, cystocele, fibroids  . KNEE SURGERY  1996  . TOE SURGERY    . VESICOVAGINAL FISTULA CLOSURE W/ TAH      There were no vitals filed for this visit.  Subjective Assessment - 05/06/19 0925    Subjective  States [place] [facility] [employer] [place] which fought her and increased her pain and she woke up the next morning and she had 8/10. States she is back to baseline today 6/10 on the left side of her low back.    Pertinent History  DB, fibromyalgia, RA,  COPD    Limitations  Sitting;Lifting;Standing;Walking;House hold activities    How long can you stand comfortably?  1 minute    Patient Stated Goals  to be able to do her tasks required during the day    Currently in Pain?  Yes    Pain Score  6     Pain Location  Back    Pain Orientation  Left    Pain Descriptors / Indicators  Sharp    Pain Type  Acute pain         [facility] PT Assessment - 05/06/19 0001      Assessment   Medical Diagnosis  LBP and SIJ pain     Referring Provider (PT)  Eunice Blase    Prior Therapy  yes for LBP in 2015                   [facility] Adult PT Treatment/Exercise - 05/06/19 0001      Lumbar Exercises: Stretches   Lower Trunk Rotation  --   x15, 5" B    Piriformis Stretch  Left;Right;5 reps;10 seconds   into ER - supine      Lumbar Exercises:  Seated   Other Seated Lumbar Exercises  self mobilization to left butt/hip with ball - tolerated well - 6 minutes      Lumbar Exercises: Supine   Bridge  5 reps   4 sets   Other Supine Lumbar Exercises  90/90 supported position - hip flexion iso and hip ext iso then hip add/abd 4x5 5" holds Ewith ball and belt       Manual Therapy   Manual Therapy  Soft tissue mobilization    Manual therapy comments  all manual interventions performed independently of other interventions    Soft tissue mobilization  STM to left hip ER, abd and along left sacral border - patient prone             PT Education - 05/06/19 1014    Education Details  on HEP and rationale for exercises.    Person(s) Educated  Patient    Methods  Explanation;Handout    Comprehension  Verbalized understanding       PT Short Term Goals - 05/04/19 1152      PT SHORT TERM GOAL #1   Title  Patient will be independent in HEP to improve functional outcomes.    Time  3    Period  Weeks    Status  New    Target Date  05/25/19      PT SHORT TERM GOAL #2   Title  Patient will report at least 25% improvement in overall symptoms  and/or functional ability.    Time  3    Period  Weeks    Status  New    Target Date  05/25/19        PT Long Term Goals - 05/04/19 1153      PT LONG TERM GOAL #1   Title  Patient will score with <51%  limitation on foto to demonstrate improved functional mobility    Baseline  3/29 67% limitation    Time  6    Period  Weeks    Status  New    Target Date  06/15/19      PT LONG TERM GOAL #2   Title  Patient wil lbe able to ambulate without cane to return to prior level of function.    Time  6    Period  Weeks    Status  New    Target Date  06/15/19      PT LONG TERM GOAL #3   Title  Patient will report at least 50% improvement in overall symptoms and/or functional ability    Time  6    Period  Weeks    Status  New    Target Date  06/15/19            Plan - 05/06/19 1011    Clinical Impression Statement  Focused on improving hip and lumbar mobility on this date. Tolerated lumbar rotation well and MMT of SIJ. Patient reported less pain 4/10 and an ache not sharp pain end of session. Added new exercises to home program and instructed patient to perform exercises that feel good more frequently. Will continue to assess lumbar/SIJ in future sessions.    Personal Factors and Comorbidities  Age;Comorbidity 1;Comorbidity 2;Comorbidity 3+    Comorbidities  RA, fibromyalgia, DB, COPD    Examination-Activity Limitations  Stand;Squat;Sit;Sleep;Bathing;Bed Mobility;Bend;Stairs;Carry;Toileting;Transfers;Hygiene/Grooming;Lift;Locomotion Level    Examination-Participation Restrictions  Cleaning;Shop;Driving;Community Activity;Meal Prep    Stability/Clinical Decision Making  Evolving/Moderate complexity    Rehab Potential  Good  PT Frequency  2x / week    PT Duration  6 weeks    PT Treatment/Interventions  ADLs/Self Care Home Management;Aquatic Therapy;Biofeedback;Cryotherapy;Electrical Stimulation;Moist Heat;Traction;Balance training;Therapeutic exercise;Therapeutic  activities;Functional mobility training;Stair training;Gait training;DME Instruction;Neuromuscular re-education;Cognitive remediation;Patient/family education;Manual techniques;Dry needling;Joint Manipulations;Passive range of motion;Spinal Manipulations    PT Next Visit Plan  continued assessment of SIJ, (MMT)/ mobilizations, pelvic mobility, stretches (prifiromis), hip mobility    PT Home Exercise Plan  3/31 piriformis IR/ER supine, hip add iso, SIJ MMT, lumbar rotation, self mobilization with ball, bridges    Consulted and Agree with Plan of Care  Patient       Patient will benefit from skilled therapeutic intervention in order to improve the following deficits and impairments:  Abnormal gait, Decreased endurance, Decreased balance, Decreased mobility, Difficulty walking, Decreased cognition, Decreased range of motion, Decreased coordination, Improper body mechanics, Decreased activity tolerance, Decreased strength, Pain, Hypomobility  Visit Diagnosis: Muscle weakness (generalized)  Difficulty in walking, not elsewhere classified  Acute midline low back pain with left-sided sciatica     Problem List Patient Active Problem List   Diagnosis Date Noted  . Vertigo 08/20/2016  . Eustachian tube dysfunction 04/17/2016  . Abdominal pain 03/02/2016  . Gastroenteritis 03/02/2016  . Rheumatoid arthritis with rheumatoid factor of multiple sites without organ or systems involvement ([place]) 01/13/2016  . HLA B27 (HLA B27 positive) 01/13/2016  . Osteoarthritis of foot 01/13/2016  . DJD (degenerative joint disease), cervical 01/13/2016  . Spondylosis of lumbar region without myelopathy or radiculopathy 01/13/2016  . Primary osteoarthritis of both hands 01/13/2016  . Non-seasonal allergic rhinitis 08/13/2014  . Stiffness of joints, not elsewhere classified, multiple sites 08/26/2013  . Flu-like symptoms 03/02/2013  . Lactic acidosis 03/02/2013  . Sinus tachycardia 03/02/2013  . Elevated lactic  acid level 03/02/2013  . Influenza due to identified novel influenza A virus with other respiratory manifestations 03/02/2013  . Hyperglycemia 03/02/2013  . Fibromyalgia   . OSA (obstructive sleep apnea) 06/22/2011  . DOE (dyspnea on exertion) 05/30/2011  . Moderate persistent asthma 03/30/2011    10:15 AM, 05/06/19 Jerene Pitch, DPT Physical Therapy with [facility]  [phone] office  [place] [address] [place], [place], [address] Phone: [phone]   Fax:  [phone]  Name: RHODIE CIENFUEGOS MRN: [id number] Date of Birth: 14-May-1955

## 2019-05-11 ENCOUNTER — Other Ambulatory Visit: Payer: Self-pay

## 2019-05-11 ENCOUNTER — Ambulatory Visit (HOSPITAL_COMMUNITY): Payer: PPO | Attending: Family Medicine | Admitting: Physical Therapy

## 2019-05-11 ENCOUNTER — Encounter (HOSPITAL_COMMUNITY): Payer: Self-pay | Admitting: Physical Therapy

## 2019-05-11 ENCOUNTER — Telehealth: Payer: Self-pay | Admitting: Family Medicine

## 2019-05-11 DIAGNOSIS — M5442 Lumbago with sciatica, left side: Secondary | ICD-10-CM | POA: Insufficient documentation

## 2019-05-11 DIAGNOSIS — R262 Difficulty in walking, not elsewhere classified: Secondary | ICD-10-CM

## 2019-05-11 DIAGNOSIS — M6281 Muscle weakness (generalized): Secondary | ICD-10-CM

## 2019-05-11 DIAGNOSIS — M21371 Foot drop, right foot: Secondary | ICD-10-CM | POA: Insufficient documentation

## 2019-05-11 DIAGNOSIS — M545 Low back pain, unspecified: Secondary | ICD-10-CM

## 2019-05-11 NOTE — Telephone Encounter (Signed)
I called and reached patient's voice mail - left message that I would try her again later.

## 2019-05-11 NOTE — Telephone Encounter (Signed)
I spoke with the patient: She continues to have pain off & on. It is worse first thing in the morning and then eases up as she moves around. Her next PT session is 4/07 - she is going once a week and doing home exercises. The patient says she has not noticed much improvement as of yet. She would like to get another ESI - her first one was 04/30/19 at [place]. Please advise.

## 2019-05-11 NOTE — Telephone Encounter (Signed)
Patient called. She would like to speak with Terri about another injection. Her call back number is [phone]

## 2019-05-11 NOTE — Therapy (Addendum)
[facility] [address] [place], [place], [address] Phone: [phone]   Fax:  [phone]  Physical Therapy Treatment and Discharge Note  Patient Details  Name: Stephanie Norris MRN: [id number] Date of Birth: [birth year] Referring Provider (PT): Legrand Como Hilts   PHYSICAL THERAPY DISCHARGE SUMMARY  Visits from Start of Care:3  Current functional level related to goals / functional outcomes: Unable to assess due to unplanned discharge   Remaining deficits: Unable to assess due to unplanned discharge   Education / Equipment: Unable to assess due to unplanned discharge Plan: Patient agrees to discharge.  Patient goals were not met. Patient is being discharged due to not returning since the last visit.  ?????          4:26 PM, 11/24/19 Jerene Pitch, DPT Physical Therapy with [facility]  [phone] office   Encounter Date: 05/11/2019  PT End of Session - 05/11/19 1452    Visit Number  3    Number of Visits  12    Date for PT Re-Evaluation  06/15/19    Authorization Type  visits based on med nec. $15 co pay, no auth req.    Authorization Time Period  POC dates 05/04/19 - 06/15/2019    Progress Note Due on Visit  10    PT Start Time  1452   pt late to session   PT Stop Time  1530    PT Time Calculation (min)  38 min    Activity Tolerance  Patient limited by pain    Behavior During Therapy  [facility] for tasks assessed/performed       Past Medical History:  Diagnosis Date  . Asthma   . COPD (chronic obstructive pulmonary disease) ([place])   . Diabetes mellitus without complication (HCC)    Type 2  . Dizziness   . Fibromyalgia   . GERD (gastroesophageal reflux disease)   . Headache   . Hyperlipidemia   . OSA (obstructive sleep apnea)   . Osteoarthritis   . PCOS (polycystic ovarian syndrome)   . Raynaud disease   . Rheumatoid arthritis [facility])     Past Surgical History:  Procedure Laterality Date  .  APPENDECTOMY    . CHOLECYSTECTOMY    . COLONOSCOPY W/ BIOPSIES  fall 2009  . COMBINED HYSTERECTOMY VAGINAL / OOPHORECTOMY / A&P REPAIR  10/2002   endometriosis, cystocele, fibroids  . KNEE SURGERY  1996  . TOE SURGERY    . VESICOVAGINAL FISTULA CLOSURE W/ TAH      There were no vitals filed for this visit.  Subjective Assessment - 05/11/19 1500    Subjective  States that this morning was painful but now she is better. States that pain came and went this weekend and Saturday was really painful. Mornings seem to be the most painful. States rocking her legs side to side help with her symptoms but that she doesn't have a good spot to do them. States her current pain is 2/10. States she has found that if she lays on the couch and lays on the two pillows.    Pertinent History  DB, fibromyalgia, RA, COPD    Limitations  Sitting;Lifting;Standing;Walking;House hold activities    How long can you stand comfortably?  1 minute    Patient Stated Goals  to be able to do her tasks required during the day         [facility] PT Assessment - 05/11/19 0001      Assessment  Medical Diagnosis  LBP and SIJ pain     Referring Provider (PT)  Eunice Blase                   [facility] Adult PT Treatment/Exercise - 05/11/19 0001      Neuro Re-ed    Neuro Re-ed Details   educated and practiced 5/5 breathing - unable to do 5 sec exhale - but performed max exhale. 6 minutes of practice       Lumbar Exercises: Stretches   Lower Trunk Rotation  5 reps;20 seconds   B   Prone on Elbows Stretch  5 reps;10 seconds      Lumbar Exercises: Supine   Other Supine Lumbar Exercises  pelvic tilts - not tolerated       Lumbar Exercises: Prone   Straight Leg Raise  5 reps;3 seconds   5 sets B   Other Prone Lumbar Exercises  hip IR/ER  PROM--> AROM - 2x 60" bouts  B     Other Prone Lumbar Exercises  hamstring curls 3x5 B       Modalities   Modalities  Moist Heat      Moist Heat Therapy   Number Minutes Moist  Heat  15 Minutes    Moist Heat Location  Lumbar Spine   during breathing exercises.             PT Education - 05/11/19 1536    Education Details  educated Patient on deep breathing and nostril breathing and how it can help down regulate CNS and help with sleep, pain and muscle tension    Person(s) Educated  Patient    Methods  Explanation    Comprehension  Verbalized understanding       PT Short Term Goals - 05/04/19 1152      PT SHORT TERM GOAL #1   Title  Patient will be independent in HEP to improve functional outcomes.    Time  3    Period  Weeks    Status  New    Target Date  05/25/19      PT SHORT TERM GOAL #2   Title  Patient will report at least 25% improvement in overall symptoms and/or functional ability.    Time  3    Period  Weeks    Status  New    Target Date  05/25/19        PT Long Term Goals - 05/04/19 1153      PT LONG TERM GOAL #1   Title  Patient will score with <51%  limitation on foto to demonstrate improved functional mobility    Baseline  3/29 67% limitation    Time  6    Period  Weeks    Status  New    Target Date  06/15/19      PT LONG TERM GOAL #2   Title  Patient wil lbe able to ambulate without cane to return to prior level of function.    Time  6    Period  Weeks    Status  New    Target Date  06/15/19      PT LONG TERM GOAL #3   Title  Patient will report at least 50% improvement in overall symptoms and/or functional ability    Time  6    Period  Weeks    Status  New    Target Date  06/15/19  Plan - 05/11/19 1539    Clinical Impression Statement  Patient continues to have difficulties with tolerance to exercises. Pain initially with hamstring curls but this abolished with repetition. Increased pain with transitional movement. Manual therapy not tolerated secondary to hypersensitivity to touch (very painful to light touch in lumbar and pelvic regions. Educated patient in benefits of deep breathing for pain,  current symptoms as well as down regulating central nervous system. Will follow up with breath work next session.    Personal Factors and Comorbidities  Age;Comorbidity 1;Comorbidity 2;Comorbidity 3+    Comorbidities  RA, fibromyalgia, DB, COPD    Examination-Activity Limitations  Stand;Squat;Sit;Sleep;Bathing;Bed Mobility;Bend;Stairs;Carry;Toileting;Transfers;Hygiene/Grooming;Lift;Locomotion Level    Examination-Participation Restrictions  Cleaning;Shop;Driving;Community Activity;Meal Prep    Stability/Clinical Decision Making  Evolving/Moderate complexity    Rehab Potential  Good    PT Frequency  2x / week    PT Duration  6 weeks    PT Treatment/Interventions  ADLs/Self Care Home Management;Aquatic Therapy;Biofeedback;Cryotherapy;Electrical Stimulation;Moist Heat;Traction;Balance training;Therapeutic exercise;Therapeutic activities;Functional mobility training;Stair training;Gait training;DME Instruction;Neuromuscular re-education;Cognitive remediation;Patient/family education;Manual techniques;Dry needling;Joint Manipulations;Passive range of motion;Spinal Manipulations    PT Next Visit Plan  continued assessment of SIJ, (MMT)/ mobilizations, pelvic mobility, stretches (prifiromis), hip mobility    PT Home Exercise Plan  3/31 piriformis IR/ER supine, hip add iso, SIJ MMT, lumbar rotation, self mobilization with ball, bridges; 4/5 deep breathing    Consulted and Agree with Plan of Care  Patient       Patient will benefit from skilled therapeutic intervention in order to improve the following deficits and impairments:  Abnormal gait, Decreased endurance, Decreased balance, Decreased mobility, Difficulty walking, Decreased cognition, Decreased range of motion, Decreased coordination, Improper body mechanics, Decreased activity tolerance, Decreased strength, Pain, Hypomobility  Visit Diagnosis: Muscle weakness (generalized)  Difficulty in walking, not elsewhere classified  Acute midline low  back pain with left-sided sciatica  Foot drop, right     Problem List Patient Active Problem List   Diagnosis Date Noted  . Vertigo 08/20/2016  . Eustachian tube dysfunction 04/17/2016  . Abdominal pain 03/02/2016  . Gastroenteritis 03/02/2016  . Rheumatoid arthritis with rheumatoid factor of multiple sites without organ or systems involvement ([place]) 01/13/2016  . HLA B27 (HLA B27 positive) 01/13/2016  . Osteoarthritis of foot 01/13/2016  . DJD (degenerative joint disease), cervical 01/13/2016  . Spondylosis of lumbar region without myelopathy or radiculopathy 01/13/2016  . Primary osteoarthritis of both hands 01/13/2016  . Non-seasonal allergic rhinitis 08/13/2014  . Stiffness of joints, not elsewhere classified, multiple sites 08/26/2013  . Flu-like symptoms 03/02/2013  . Lactic acidosis 03/02/2013  . Sinus tachycardia 03/02/2013  . Elevated lactic acid level 03/02/2013  . Influenza due to identified novel influenza A virus with other respiratory manifestations 03/02/2013  . Hyperglycemia 03/02/2013  . Fibromyalgia   . OSA (obstructive sleep apnea) 06/22/2011  . DOE (dyspnea on exertion) 05/30/2011  . Moderate persistent asthma 03/30/2011   3:45 PM, 05/11/19 Jerene Pitch, DPT Physical Therapy with [facility]  [phone] office  [facility] [address] [place], [place], [address] Phone: [phone]   Fax:  [phone]  Name: EMELIE NEWSOM MRN: [id number] Date of Birth: 11/23/1955

## 2019-05-12 NOTE — Addendum Note (Signed)
Addended by: Hortencia Pilar on: 05/12/2019 08:04 AM   Modules accepted: Orders

## 2019-05-12 NOTE — Telephone Encounter (Signed)
Ordered

## 2019-05-12 NOTE — Telephone Encounter (Signed)
I called and advised the patient of the plan [facility] will contact her to set up another ESI.

## 2019-05-13 ENCOUNTER — Telehealth (HOSPITAL_COMMUNITY): Payer: Self-pay | Admitting: Physical Therapy

## 2019-05-13 ENCOUNTER — Ambulatory Visit (HOSPITAL_COMMUNITY): Payer: PPO | Admitting: Physical Therapy

## 2019-05-13 NOTE — Telephone Encounter (Signed)
pt cancelled appt today becasue she got her 2nd COVID shot and is sick

## 2019-05-14 ENCOUNTER — Ambulatory Visit
Admission: RE | Admit: 2019-05-14 | Discharge: 2019-05-14 | Disposition: A | Discharge status: Home / Self Care | Payer: PPO | Source: Ambulatory Visit | Attending: Family Medicine | Admitting: Family Medicine

## 2019-05-14 ENCOUNTER — Other Ambulatory Visit: Payer: Self-pay

## 2019-05-14 DIAGNOSIS — M47817 Spondylosis without myelopathy or radiculopathy, lumbosacral region: Secondary | ICD-10-CM | POA: Diagnosis not present

## 2019-05-14 DIAGNOSIS — M545 Low back pain, unspecified: Secondary | ICD-10-CM

## 2019-05-14 MED ORDER — METHYLPREDNISOLONE ACETATE 40 MG/ML INJ SUSP (RADIOLOG
120.0000 mg | Freq: Once | INTRAMUSCULAR | Status: AC
  Administered 2019-05-14: 120 mg via EPIDURAL

## 2019-05-14 MED ORDER — IOPAMIDOL (ISOVUE-M 200) INJECTION 41%
1.0000 mL | Freq: Once | INTRAMUSCULAR | Status: AC
  Administered 2019-05-14: 1 mL via EPIDURAL

## 2019-05-15 ENCOUNTER — Telehealth: Payer: Self-pay

## 2019-05-15 NOTE — Telephone Encounter (Signed)
Patient states she is s/p an ESI L spine at [facility] radiology yesterday and that she woke up last night with N/T in her BLE. This morning her feet are still numb and does not know what she should do. Would like a call back to discuss.

## 2019-05-15 NOTE — Telephone Encounter (Signed)
I called and spoke with patient.

## 2019-05-18 ENCOUNTER — Ambulatory Visit (HOSPITAL_COMMUNITY): Payer: PPO | Admitting: Physical Therapy

## 2019-05-18 DIAGNOSIS — M5442 Lumbago with sciatica, left side: Secondary | ICD-10-CM | POA: Diagnosis not present

## 2019-05-19 ENCOUNTER — Other Ambulatory Visit (HOSPITAL_BASED_OUTPATIENT_CLINIC_OR_DEPARTMENT_OTHER): Payer: Self-pay | Admitting: Neurosurgery

## 2019-05-19 ENCOUNTER — Ambulatory Visit (HOSPITAL_COMMUNITY): Payer: PPO

## 2019-05-19 DIAGNOSIS — M5442 Lumbago with sciatica, left side: Secondary | ICD-10-CM

## 2019-05-20 ENCOUNTER — Ambulatory Visit (HOSPITAL_COMMUNITY): Payer: PPO | Admitting: Physical Therapy

## 2019-05-20 ENCOUNTER — Telehealth (HOSPITAL_COMMUNITY): Payer: Self-pay | Admitting: Physical Therapy

## 2019-05-20 NOTE — Telephone Encounter (Signed)
pt called to cx today's appt due to she is going to be taking classes at the [employer] told her about our program with [facility] but she refused. Pt wants to be discharged

## 2019-05-24 ENCOUNTER — Ambulatory Visit (HOSPITAL_BASED_OUTPATIENT_CLINIC_OR_DEPARTMENT_OTHER)
Admission: RE | Admit: 2019-05-24 | Discharge: 2019-05-24 | Disposition: A | Discharge status: Home / Self Care | Payer: PPO | Source: Ambulatory Visit | Attending: Neurosurgery | Admitting: Neurosurgery

## 2019-05-24 ENCOUNTER — Other Ambulatory Visit: Payer: Self-pay

## 2019-05-24 DIAGNOSIS — M545 Low back pain: Secondary | ICD-10-CM | POA: Diagnosis not present

## 2019-05-24 DIAGNOSIS — M5442 Lumbago with sciatica, left side: Secondary | ICD-10-CM | POA: Diagnosis not present

## 2019-05-25 ENCOUNTER — Ambulatory Visit (HOSPITAL_COMMUNITY): Payer: PPO | Admitting: Physical Therapy

## 2019-05-26 DIAGNOSIS — M5126 Other intervertebral disc displacement, lumbar region: Secondary | ICD-10-CM | POA: Diagnosis not present

## 2019-05-27 ENCOUNTER — Encounter (HOSPITAL_COMMUNITY): Payer: Self-pay | Admitting: Physical Therapy

## 2019-05-28 ENCOUNTER — Telehealth: Payer: Self-pay

## 2019-05-28 ENCOUNTER — Ambulatory Visit: Payer: PPO | Attending: Internal Medicine

## 2019-05-28 ENCOUNTER — Other Ambulatory Visit: Payer: Self-pay

## 2019-05-28 DIAGNOSIS — Z20822 Contact with and (suspected) exposure to covid-19: Secondary | ICD-10-CM | POA: Diagnosis not present

## 2019-05-28 MED ORDER — METHOTREXATE 2.5 MG PO TABS: ORAL_TABLET | ORAL | 0 refills | Status: DC

## 2019-05-28 NOTE — Telephone Encounter (Signed)
Refill request received via fax from [facility] in [place] for methotrexate.   Last Visit: 04/15/2019 Next Visit: 09/09/2019 Labs: 04/08/2019 CBC and CMP are stable.  Okay to refill per Dr. Estanislado Pandy.

## 2019-05-29 LAB — SPECIMEN STATUS REPORT

## 2019-05-29 LAB — SARS-COV-2, NAA 2 DAY TAT

## 2019-05-29 LAB — NOVEL CORONAVIRUS, NAA: SARS-CoV-2, NAA: NOT DETECTED

## 2019-06-01 ENCOUNTER — Encounter (HOSPITAL_COMMUNITY): Payer: Self-pay | Admitting: Physical Therapy

## 2019-06-01 ENCOUNTER — Telehealth: Payer: Self-pay | Admitting: *Deleted

## 2019-06-01 NOTE — Telephone Encounter (Signed)
Patient called ,given negative covid results .

## 2019-06-03 ENCOUNTER — Encounter (HOSPITAL_COMMUNITY): Payer: Self-pay | Admitting: Physical Therapy

## 2019-06-03 DIAGNOSIS — M5116 Intervertebral disc disorders with radiculopathy, lumbar region: Secondary | ICD-10-CM | POA: Diagnosis not present

## 2019-06-03 DIAGNOSIS — E119 Type 2 diabetes mellitus without complications: Secondary | ICD-10-CM | POA: Diagnosis not present

## 2019-06-03 DIAGNOSIS — M5126 Other intervertebral disc displacement, lumbar region: Secondary | ICD-10-CM | POA: Diagnosis not present

## 2019-06-03 DIAGNOSIS — G720 Drug-induced myopathy: Secondary | ICD-10-CM | POA: Diagnosis not present

## 2019-06-03 DIAGNOSIS — J45909 Unspecified asthma, uncomplicated: Secondary | ICD-10-CM | POA: Diagnosis not present

## 2019-06-03 DIAGNOSIS — E782 Mixed hyperlipidemia: Secondary | ICD-10-CM | POA: Diagnosis not present

## 2019-06-08 ENCOUNTER — Encounter (HOSPITAL_COMMUNITY): Payer: Self-pay | Admitting: Physical Therapy

## 2019-06-10 ENCOUNTER — Encounter (HOSPITAL_COMMUNITY): Payer: Self-pay | Admitting: Physical Therapy

## 2019-06-18 ENCOUNTER — Other Ambulatory Visit: Payer: Self-pay

## 2019-06-18 ENCOUNTER — Ambulatory Visit
Admission: EM | Admit: 2019-06-18 | Discharge: 2019-06-18 | Disposition: A | Discharge status: Home / Self Care | Payer: PPO | Attending: Emergency Medicine | Admitting: Emergency Medicine

## 2019-06-18 ENCOUNTER — Ambulatory Visit (INDEPENDENT_AMBULATORY_CARE_PROVIDER_SITE_OTHER): Payer: PPO

## 2019-06-18 DIAGNOSIS — W1800XA Striking against unspecified object with subsequent fall, initial encounter: Secondary | ICD-10-CM | POA: Diagnosis not present

## 2019-06-18 DIAGNOSIS — S0993XA Unspecified injury of face, initial encounter: Secondary | ICD-10-CM | POA: Diagnosis not present

## 2019-06-18 DIAGNOSIS — J069 Acute upper respiratory infection, unspecified: Secondary | ICD-10-CM | POA: Diagnosis not present

## 2019-06-18 DIAGNOSIS — R05 Cough: Secondary | ICD-10-CM

## 2019-06-18 DIAGNOSIS — R519 Headache, unspecified: Secondary | ICD-10-CM

## 2019-06-18 MED ORDER — GUAIFENESIN 200 MG PO TABS: 200.0000 mg | ORAL_TABLET | ORAL | 0 refills | Status: DC | PRN

## 2019-06-18 MED ORDER — BENZONATATE 100 MG PO CAPS: 100.0000 mg | ORAL_CAPSULE | Freq: Three times a day (TID) | ORAL | 0 refills | Status: DC

## 2019-06-18 NOTE — ED Provider Notes (Signed)
[facility]    CSN: [id number] Arrival date & time: 06/18/19  1529      History   Chief Complaint Chief Complaint  Patient presents with  . Cough    HPI Stephanie Norris is a 64 y.o. female.   Who initially presented to the urgent care with a complaint of cough and congestion for the past few days.  Denies sick exposure to COVID, flu or strep.  Denies recent travel.  Denies aggravating or alleviating symptoms.  Denies previous COVID infection.    On presentation she had a facial swelling. Reports she fell on Monday and hit her face to the table.  Has bruising to both eyes.  She  has a back surgery 2 weeks ago and has difficulty coughing due to pain. Denies chills, fever, nausea, vomiting, diarrhea, shortness of breath, blurred vision, confusion, headache.  The history is provided by the patient. No language interpreter was used.  Cough   Past Medical History:  Diagnosis Date  . Asthma   . COPD (chronic obstructive pulmonary disease) ([place])   . Diabetes mellitus without complication (HCC)    Type 2  . Dizziness   . Fibromyalgia   . GERD (gastroesophageal reflux disease)   . Headache   . Hyperlipidemia   . OSA (obstructive sleep apnea)   . Osteoarthritis   . PCOS (polycystic ovarian syndrome)   . Raynaud disease   . Rheumatoid arthritis [facility])     Patient Active Problem List   Diagnosis Date Noted  . Vertigo 08/20/2016  . Eustachian tube dysfunction 04/17/2016  . Abdominal pain 03/02/2016  . Gastroenteritis 03/02/2016  . Rheumatoid arthritis with rheumatoid factor of multiple sites without organ or systems involvement ([place]) 01/13/2016  . HLA B27 (HLA B27 positive) 01/13/2016  . Osteoarthritis of foot 01/13/2016  . DJD (degenerative joint disease), cervical 01/13/2016  . Spondylosis of lumbar region without myelopathy or radiculopathy 01/13/2016  . Primary osteoarthritis of both hands 01/13/2016  . Non-seasonal allergic rhinitis 08/13/2014  . Stiffness  of joints, not elsewhere classified, multiple sites 08/26/2013  . Flu-like symptoms 03/02/2013  . Lactic acidosis 03/02/2013  . Sinus tachycardia 03/02/2013  . Elevated lactic acid level 03/02/2013  . Influenza due to identified novel influenza A virus with other respiratory manifestations 03/02/2013  . Hyperglycemia 03/02/2013  . Fibromyalgia   . OSA (obstructive sleep apnea) 06/22/2011  . DOE (dyspnea on exertion) 05/30/2011  . Moderate persistent asthma 03/30/2011    Past Surgical History:  Procedure Laterality Date  . APPENDECTOMY    . CHOLECYSTECTOMY    . COLONOSCOPY W/ BIOPSIES  fall 2009  . COMBINED HYSTERECTOMY VAGINAL / OOPHORECTOMY / A&P REPAIR  10/2002   endometriosis, cystocele, fibroids  . KNEE SURGERY  1996  . TOE SURGERY    . VESICOVAGINAL FISTULA CLOSURE W/ TAH      OB History    Gravida  1   Para      Term      Preterm      AB  1   Living  0     SAB      TAB      Ectopic      Multiple      Live Births               Home Medications    Prior to Admission medications   Medication Sig Start Date End Date Taking? Authorizing Provider  albuterol (PROVENTIL HFA;VENTOLIN HFA) 108 (90 Base) MCG/ACT inhaler  Inhale 2 puffs into the lungs every 4 (four) hours as needed for wheezing or shortness of breath. 10/15/17   Bobbitt, Sedalia Muta, MD  baclofen (LIORESAL) 10 MG tablet Take 0.5-1 tablets (5-10 mg total) by mouth 3 (three) times daily as needed for muscle spasms. 04/20/19   Hilts, Legrand Como, MD  benzonatate (TESSALON) 100 MG capsule Take 1 capsule (100 mg total) by mouth every 8 (eight) hours. 06/18/19   Trew Sunde, Darrelyn Hillock, FNP  Continuous Blood Gluc Sensor ([place]) MISC as needed. 02/12/17   [provider]  diclofenac sodium (VOLTAREN) 1 % GEL Apply 3 grams to three large joints up to three times daily as needed 02/21/17   Ofilia Neas, PA-C  docusate sodium (COLACE) 100 MG capsule Take 1 capsule (100 mg total) by  mouth every 12 (twelve) hours. 04/17/19   Harris, Vernie Shanks, PA-C  DULoxetine (CYMBALTA) 60 MG capsule TAKE 1 CAPSULE BY MOUTH EVERY DAY 09/06/17   Bo Merino, MD  fluticasone (FLONASE) 50 MCG/ACT nasal spray Place 2 sprays into both nostrils daily. 10/15/17   Bobbitt, Sedalia Muta, MD  fluticasone furoate-vilanterol (BREO ELLIPTA) 200-25 MCG/INH AEPB Inhale 1 puff into the lungs daily. 10/15/17   Bobbitt, Sedalia Muta, MD  folic acid (FOLVITE) 1 MG tablet Take 2 tablets (2 mg total) by mouth daily. 03/23/16   Panwala, Naitik, PA-C  guaiFENesin 200 MG tablet Take 1 tablet (200 mg total) by mouth every 4 (four) hours as needed for cough or to loosen phlegm. 06/18/19   Seaton Hofmann, Darrelyn Hillock, FNP  HYDROcodone-acetaminophen (NORCO/VICODIN) 5-325 MG tablet Take 1 tablet by mouth 2 (two) times daily as needed for moderate pain. 05/05/19   Hilts, Legrand Como, MD  INVOKANA 300 MG TABS tablet 150 mg, patient states she takes 1/2 tab 05/22/18   [provider]  ipratropium (ATROVENT) 0.06 % nasal spray ipratropium bromide 42 mcg (0.06 %) nasal spray    [provider]  metFORMIN (GLUCOPHAGE-XR) 500 MG 24 hr tablet Take 1,000 mg by mouth 2 (two) times daily.  01/04/16   [provider]  methotrexate (RHEUMATREX) 2.5 MG tablet TAKE 6 TABLETS BY MOUTH ONCE WEEKLY. Caution:Chemotherapy. Protect from light. 05/28/19   Bo Merino, MD  methylPREDNISolone (MEDROL DOSEPAK) 4 MG TBPK tablet See admin instructions. follow package directions 04/12/19   [provider]  montelukast (SINGULAIR) 10 MG tablet Take 1 tablet (10 mg total) by mouth at bedtime. 10/15/17   Bobbitt, Sedalia Muta, MD  Multiple Vitamins-Minerals (MULTIVITAMIN PO) Take by mouth.    [provider]  omega-3 acid ethyl esters (LOVAZA) 1 g capsule Take 2 capsules by mouth 2 (two) times daily. 03/11/18   [provider]  ONETOUCH VERIO test strip  01/04/16   [provider]  oxyCODONE-acetaminophen  (PERCOCET) 5-325 MG tablet Take 1-2 tablets by mouth every 6 (six) hours as needed. 04/17/19   Harris, Vernie Shanks, PA-C  pantoprazole (PROTONIX) 40 MG tablet Take 1 tablet by mouth 2 (two) times daily.  07/14/14   [provider]  valACYclovir (VALTREX) 1000 MG tablet SMARTSIG:2 Tablet(s) By Mouth Every 12 Hours PRN 11/12/18   [provider]    Family History Family History  Problem Relation Age of Onset  . Asthma Mother   . Rheum arthritis Mother   . Allergic rhinitis Mother   . Rheum arthritis Sister   . Allergic rhinitis Sister   . Heart failure Father   . Heart disease Father   . Rheum arthritis Sister   .  Allergic rhinitis Sister   . Allergic rhinitis Brother   . Allergies Other        "Everyone"  . Heart disease Maternal Grandfather   . Heart disease Maternal Grandmother   . Heart disease Paternal Grandfather   . Heart disease Paternal Grandmother     Social History Social History   Tobacco Use  . Smoking status: Never Smoker  . Smokeless tobacco: Never Used  Substance Use Topics  . Alcohol use: No  . Drug use: No     Allergies   Nsaids, Bactrim [sulfamethoxazole-trimethoprim], Cephalexin, Levofloxacin, Macrobid [nitrofurantoin], and Red dye   Review of Systems Review of Systems  Constitutional: Negative.   HENT: Positive for congestion and facial swelling.   Respiratory: Positive for cough.   Cardiovascular: Negative.   Gastrointestinal: Negative.   Neurological: Negative.   All other systems reviewed and are negative. Helen Hashimoto   Physical Exam Triage Vital Signs ED Triage Vitals  Enc Vitals Group     BP 06/18/19 1542 105/70     Pulse Rate 06/18/19 1542 91     Resp 06/18/19 1542 20     Temp 06/18/19 1542 98.2 F (36.8 C)     Temp src --      SpO2 06/18/19 1542 95 %     Weight --      Height --      Head Circumference --      Peak Flow --      Pain Score 06/18/19 1540 7     Pain Loc --      Pain Edu? --      Excl. in [place]? --     No data found.  Updated Vital Signs BP 105/70   Pulse 91   Temp 98.2 F (36.8 C)   Resp 20   SpO2 95%   Visual Acuity Right Eye Distance:   Left Eye Distance:   Bilateral Distance:    Right Eye Near:   Left Eye Near:    Bilateral Near:     Physical Exam Vitals and nursing note reviewed.  Constitutional:      General: She is not in acute distress.    Appearance: Normal appearance. She is normal weight. She is not ill-appearing, toxic-appearing or diaphoretic.  HENT:     Head: Normocephalic.     Right Ear: Tympanic membrane, ear canal and external ear normal. There is no impacted cerumen.     Left Ear: Tympanic membrane, ear canal and external ear normal. There is no impacted cerumen.     Nose: Nose normal. No congestion.     Mouth/Throat:     Mouth: Mucous membranes are moist.     Pharynx: Oropharynx is clear. No oropharyngeal exudate or posterior oropharyngeal erythema.  Eyes:     General: Vision grossly intact. Gaze aligned appropriately. No visual field deficit.    Comments: Lower eyelid with ecchymosis and swelling  Cardiovascular:     Rate and Rhythm: Normal rate and regular rhythm.     Pulses: Normal pulses.     Heart sounds: Normal heart sounds. No murmur. No friction rub. No gallop.   Pulmonary:     Effort: Pulmonary effort is normal. No respiratory distress.     Breath sounds: No stridor. Rales present. No wheezing or rhonchi.  Chest:     Chest wall: No tenderness.  Abdominal:     General: Abdomen is flat. Bowel sounds are normal. There is no distension.     Palpations: There is  no mass.     Tenderness: There is no abdominal tenderness.     Hernia: No hernia is present.  Skin:    Capillary Refill: Capillary refill takes less than 2 seconds.  Neurological:     General: No focal deficit present.     Mental Status: She is alert and oriented to person, place, and time.      UC Treatments / Results  Labs (all labs ordered are listed, but only  abnormal results are displayed) Labs Reviewed - No data to display  EKG   Radiology DG Facial Bones Complete  Result Date: 06/18/2019 CLINICAL DATA:  Facial pain after fall last week. EXAM: FACIAL BONES COMPLETE 3+V COMPARISON:  None. FINDINGS: There is no evidence of fracture or other significant bone abnormality. No orbital emphysema or sinus air-fluid levels are seen. IMPRESSION: Negative. Electronically Signed   By: Marijo Conception M.D.   On: 06/18/2019 16:15   DG Chest 2 View  Result Date: 06/18/2019 CLINICAL DATA:  Cough. EXAM: CHEST - 2 VIEW COMPARISON:  May 26, 2013. FINDINGS: The heart size and mediastinal contours are within normal limits. Both lungs are clear. The visualized skeletal structures are unremarkable. IMPRESSION: No active cardiopulmonary disease. Electronically Signed   By: Marijo Conception M.D.   On: 06/18/2019 16:50    Procedures Procedures (including critical care time)  Medications Ordered in UC Medications - No data to display  Initial Impression / Assessment and Plan / UC Course  I have reviewed the triage vital signs and the nursing notes.  Pertinent labs & imaging results that were available during my care of the patient were reviewed by me and considered in my medical decision making (see chart for details).    Patient is stable for discharge.  Fascia x-ray is negative for bony abnormality including fracture or dislocation.  Chest x-ray is negative for acute pulmonary disease.  I have reviewed both x-ray myself and the radiologist interpretation.  I am in agreement with the radiologist interpretation. Guaifenesin and Tessalon Perles were prescribed respectively for congestion and cough.  Was advised to follow-up with PCP.  Final Clinical Impressions(s) / UC Diagnoses   Final diagnoses:  URI with cough and congestion  Fall against object     Discharge Instructions     Facial x-ray are negative for acute fracture Chest x-ray is negative for  acute cardiopulmonary disease Advised to continue to take allergy medication and Flonase as prescribed Tessalon Perles prescribed for cough Guaifenesin was prescribed to help with congestion Follow-up with PCP Return or go to ED for worsening symptoms    ED Prescriptions    Medication Sig Dispense Auth. Provider   benzonatate (TESSALON) 100 MG capsule Take 1 capsule (100 mg total) by mouth every 8 (eight) hours. 30 capsule Aluel Schwarz S, FNP   guaiFENesin 200 MG tablet Take 1 tablet (200 mg total) by mouth every 4 (four) hours as needed for cough or to loosen phlegm. 30 suppository Rachna Schonberger, Darrelyn Hillock, FNP     I have reviewed the PDMP during this encounter.   Emerson Monte, [place] 06/18/19 1710

## 2019-06-18 NOTE — Discharge Instructions (Addendum)
Facial x-ray are negative for acute fracture Chest x-ray is negative for acute cardiopulmonary disease Advised to continue to take allergy medication and Flonase as prescribed Tessalon Perles prescribed for cough Guaifenesin was prescribed to help with congestion Follow-up with PCP Return or go to ED for worsening symptoms

## 2019-06-18 NOTE — ED Triage Notes (Signed)
Pt presents with c/o cough and congestion that began yesterday . Pt also had a fall on monday and hit forehead, has bruising to both eyes . Pt also had back surgery 2 weeks ago and hurts to cough

## 2019-06-23 DIAGNOSIS — J069 Acute upper respiratory infection, unspecified: Secondary | ICD-10-CM | POA: Diagnosis not present

## 2019-08-06 NOTE — Progress Notes (Signed)
Office Visit Note  Patient: Stephanie Norris             Date of Birth: November 05, 1955           MRN: [id number]             PCP: Celene Squibb, MD Referring: Celene Squibb, MD Visit Date: 08/07/2019 Occupation: '@GUAROCC'$ @  Subjective:  Left SI joint pain   History of Present Illness: Stephanie Norris is a 64 y.o. female with history of seropositive rheumatoid arthritis, osteoarthritis, and fibromyalgia.  She is taking methotrexate 6 tablets by mouth once weekly and folic acid 2 mg po daily.  She denies any recent rheumatoid arthritis flares.  She presents today with left SI joint pain.  She requested a cortisone injection today.  She states that in April she had a discectomy performed by Dr. Arnoldo Morale.  She denies any complications.  She went to physical therapy which was helpful.  She denies any other new concerns at this time.  Activities of Daily Living:  Patient reports morning stiffness for  10 minutes.   Patient Reports nocturnal pain.  Difficulty dressing/grooming: Denies Difficulty climbing stairs: Reports Difficulty getting out of chair: Reports Difficulty using hands for taps, buttons, cutlery, and/or writing: Reports  Review of Systems  Constitutional: Positive for fatigue.  HENT: Positive for mouth dryness. Negative for mouth sores and nose dryness.   Eyes: Positive for dryness. Negative for pain and visual disturbance.  Respiratory: Negative for cough, hemoptysis and difficulty breathing.   Cardiovascular: Negative for chest pain, palpitations and hypertension.  Gastrointestinal: Positive for constipation. Negative for blood in stool and diarrhea.  Endocrine: Negative for increased urination.  Genitourinary: Negative for painful urination.  Musculoskeletal: Positive for arthralgias, joint pain, joint swelling, muscle weakness, morning stiffness and muscle tenderness. Negative for myalgias and myalgias.  Skin: Negative for color change, pallor, rash, hair loss, nodules/bumps, skin  tightness, ulcers and sensitivity to sunlight.  Neurological: Negative for dizziness, numbness and headaches.  Hematological: Negative for bruising/bleeding tendency and swollen glands.  Psychiatric/Behavioral: Positive for sleep disturbance. Negative for depressed mood. The patient is not nervous/anxious.     PMFS History:  Patient Active Problem List   Diagnosis Date Noted  . Vertigo 08/20/2016  . Eustachian tube dysfunction 04/17/2016  . Abdominal pain 03/02/2016  . Gastroenteritis 03/02/2016  . Rheumatoid arthritis with rheumatoid factor of multiple sites without organ or systems involvement ([place]) 01/13/2016  . HLA B27 (HLA B27 positive) 01/13/2016  . Osteoarthritis of foot 01/13/2016  . DJD (degenerative joint disease), cervical 01/13/2016  . Spondylosis of lumbar region without myelopathy or radiculopathy 01/13/2016  . Primary osteoarthritis of both hands 01/13/2016  . Non-seasonal allergic rhinitis 08/13/2014  . Stiffness of joints, not elsewhere classified, multiple sites 08/26/2013  . Flu-like symptoms 03/02/2013  . Lactic acidosis 03/02/2013  . Sinus tachycardia 03/02/2013  . Elevated lactic acid level 03/02/2013  . Influenza due to identified novel influenza A virus with other respiratory manifestations 03/02/2013  . Hyperglycemia 03/02/2013  . Fibromyalgia   . OSA (obstructive sleep apnea) 06/22/2011  . DOE (dyspnea on exertion) 05/30/2011  . Moderate persistent asthma 03/30/2011    Past Medical History:  Diagnosis Date  . Asthma   . COPD (chronic obstructive pulmonary disease) ([place])   . Diabetes mellitus without complication (HCC)    Type 2  . Dizziness   . Fibromyalgia   . GERD (gastroesophageal reflux disease)   . Headache   . Hyperlipidemia   .  OSA (obstructive sleep apnea)   . Osteoarthritis   . PCOS (polycystic ovarian syndrome)   . Raynaud disease   . Rheumatoid arthritis ([place])     Family History  Problem Relation Age of Onset  . Asthma Mother     . Rheum arthritis Mother   . Allergic rhinitis Mother   . Rheum arthritis Sister   . Allergic rhinitis Sister   . Heart failure Father   . Heart disease Father   . Rheum arthritis Sister   . Allergic rhinitis Sister   . Allergic rhinitis Brother   . Allergies Other        "Everyone"  . Heart disease Maternal Grandfather   . Heart disease Maternal Grandmother   . Heart disease Paternal Grandfather   . Heart disease Paternal Grandmother    Past Surgical History:  Procedure Laterality Date  . APPENDECTOMY    . BACK SURGERY  2021  . CHOLECYSTECTOMY    . COLONOSCOPY W/ BIOPSIES  fall 2009  . COMBINED HYSTERECTOMY VAGINAL / OOPHORECTOMY / A&P REPAIR  10/2002   endometriosis, cystocele, fibroids  . KNEE SURGERY  1996  . TOE SURGERY    . VESICOVAGINAL FISTULA CLOSURE W/ TAH     Social History   Social History Narrative   Lives with husband   Retired    no children   Assoc degree   16 oz caffeine daily   Immunization History  Administered Date(s) Administered  . Influenza Whole 10/07/2010, 11/06/2011  . Influenza,inj,Quad PF,6+ Mos 11/05/2012, 11/02/2015, 11/22/2016, 11/15/2017  . Influenza-Unspecified 11/05/2013  . Pneumococcal Conjugate-13 11/06/2014  . Pneumococcal Polysaccharide-23 11/02/2015     Objective: Vital Signs: BP 134/75 (BP Location: Left Arm, Patient Position: Sitting, Cuff Size: Small)   Pulse 73   Resp 12   Ht '4\' 11"'$  (1.499 m)   Wt 124 lb 3.2 oz (56.3 kg)   BMI 25.09 kg/m    Physical Exam Vitals and nursing note reviewed.  Constitutional:      Appearance: She is well-developed.  HENT:     Head: Normocephalic and atraumatic.  Eyes:     Conjunctiva/sclera: Conjunctivae normal.  Pulmonary:     Effort: Pulmonary effort is normal.  Abdominal:     General: Bowel sounds are normal.     Palpations: Abdomen is soft.  Musculoskeletal:     Cervical back: Normal range of motion.  Lymphadenopathy:     Cervical: No cervical adenopathy.  Skin:     General: Skin is warm and dry.     Capillary Refill: Capillary refill takes less than 2 seconds.  Neurological:     Mental Status: She is alert and oriented to person, place, and time.  Psychiatric:        Behavior: Behavior normal.      Musculoskeletal Exam: C-spine good ROM.  Mild thoracic kyphosis noted.  Midline spinal tenderness in the lumbar region.  Tenderness over the left SI joint.  Shoulder joints, elbow joints, wrist joints, MCPs, PIPs, and DIPs good ROM with no synovitis.  Complete fist formation bilaterally.  Hip joints good ROM with no discomfort.  Tenderness over bilateral trochanteric bursa.  Knee joints good ROM with no discomfort.  Bilateral knee crepitus.  No warmth or effusion of knee joints noted.  Ankle joints have good ROM with no tenderness or inflammation.   CDAI Exam: CDAI Score: 0.6  Patient Global: 3 mm; Provider Global: 3 mm Swollen: 0 ; Tender: 0  Joint Exam 08/07/2019   No joint  exam has been documented for this visit   There is currently no information documented on the homunculus. Go to the Rheumatology activity and complete the homunculus joint exam.  Investigation: No additional findings.  Imaging: No results found.  Recent Labs: Lab Results  Component Value Date   WBC 17.0 (H) 04/17/2019   HGB 13.0 04/17/2019   PLT 455 (H) 04/17/2019   NA 136 04/17/2019   K 4.2 04/17/2019   CL 107 04/17/2019   CO2 18 (L) 04/17/2019   GLUCOSE 263 (H) 04/17/2019   BUN 37 (H) 04/17/2019   CREATININE 0.72 04/17/2019   BILITOT 0.2 04/08/2019   ALKPHOS 54 09/10/2016   AST 15 04/08/2019   ALT 23 04/08/2019   PROT 6.4 04/08/2019   ALBUMIN 4.0 09/10/2016   CALCIUM 9.4 04/17/2019   GFRAA >60 04/17/2019    Speciality Comments: No specialty comments available.  Procedures:  Sacroiliac Joint Inj on 08/07/2019 8:57 AM Indications: pain Details: 27 G 1.5 in needle, posterior approach Medications: 1 mL lidocaine 1 %; 40 mg triamcinolone acetonide 40  MG/ML Aspirate: 0 mL Outcome: tolerated well, no immediate complications Procedure, treatment alternatives, risks and benefits explained, specific risks discussed. Consent was given by the patient. Immediately prior to procedure a time out was called to verify the correct patient, procedure, equipment, support staff and site/side marked as required. Patient was prepped and draped in the usual sterile fashion.     Allergies: Nsaids, Bactrim [sulfamethoxazole-trimethoprim], Cephalexin, Levofloxacin, and Macrobid [nitrofurantoin]   Assessment / Plan:     Visit Diagnoses: Rheumatoid arthritis with rheumatoid factor of multiple sites without organ or systems involvement (HCC) - +CCP: She has no synovitis or tenderness on exam.  She has not had any recent rheumatoid arthritis flares.  She is clinically doing well on methotrexate 6 tablets by mouth once weekly and folic acid 2 mg by mouth daily.  She has not missed any doses of methotrexate recently and is tolerating it without any side effects.  She will continue on methotrexate as prescribed.  She does not need any refills at this time.  She was advised to notify us if she develops increased joint pain or joint swelling.  She will follow-up in the office in 5 months.  High risk medication use - Methotrexate 6 tablets every 7 days and folic acid 1 mg 2 tablets daily.  BMP and CBC were drawn on 04/17/2019.  She is due to update lab work today.  Orders were released.  She will return for lab work in October and every 3 months to monitor for drug toxicity.  Standing orders for CBC and CMP are in place.- Plan: COMPLETE METABOLIC PANEL WITH GFR, CBC with Differential/Platelet  HLA B27 (HLA B27 positive) - No features of spondyloarthropathy.   Primary osteoarthritis of both hands she has PIP and DIP thickening consistent with osteoarthritis of both hands.  She has subluxation of bilateral first DIP joints.  No inflammation was noted on exam.  She has complete  fist formation bilaterally.  Joint protection and muscle strengthening were discussed.  Primary osteoarthritis of both feet: She is not having any discomfort in her feet at this time.  She has good range of motion of both ankle joints with no tenderness or inflammation.  She wears proper fitting shoes.  Fibromyalgia: She has generalized hyperalgesia and positive tender points on exam.  She continues to have frequent fibromyalgia flares.  She had a flare last night and states that her pain was so severe  she was in tears.  She took gabapentin which helped her sleep last night.  We discussed the importance of regular exercise and good sleep hygiene.  Trochanteric bursitis of both hips: She has tenderness over bilateral trochanteric bursa.  She was encouraged to perform stretching exercises daily.  She was given a handout of these exercises to perform.  DDD (degenerative disc disease), cervical: She has good range of motion with no discomfort at this time.  She does have trapezius muscle tension and muscle tenderness bilaterally.  She has no symptoms of radiculopathy currently.  DDD (degenerative disc disease), lumbar: She has chronic lower back pain.  She is followed by Dr. Arnoldo Morale.  She had a discectomy performed in April 2021 and did not have any complications.  She went to physical therapy which was helpful.  She has some midline spinal tenderness in the lumbar region.  A prescription for Lidoderm patches was sent to the pharmacy today.  Chronic SI joint pain: She has tenderness to palpation over the left SI joint.  She requested a left SI joint cortisone injection.  She tolerated the procedure well.  Aftercare was discussed.  She was given a prescription for Lidoderm patches to alleviate her discomfort.  Other fatigue: Chronic and secondary to insomnia.  Other medical conditions are listed as follows:  History of asthma  History of depression  History of diabetes mellitus  History of sleep  apnea  Orders: Orders Placed This Encounter  Procedures  . Sacroiliac Joint Inj  . COMPLETE METABOLIC PANEL WITH GFR  . CBC with Differential/Platelet   Meds ordered this encounter  Medications  . lidocaine (LIDODERM) 5 %    Sig: Place 1 patch onto the skin daily. Remove & Discard patch within 12 hours or as directed by MD    Dispense:  30 patch    Refill:  0    Face-to-face time spent with patient was 30 minutes. Greater than 50% of time was spent in counseling and coordination of care.  Follow-Up Instructions: Return in about 5 months (around 01/07/2020) for Rheumatoid arthritis, Fibromyalgia, Osteoarthritis.   Ofilia Neas, PA-C  Note - This record has been created using Dragon software.  Chart creation errors have been sought, but may not always  have been located. Such creation errors do not reflect on  the standard of medical care.

## 2019-08-07 ENCOUNTER — Other Ambulatory Visit: Payer: Self-pay

## 2019-08-07 ENCOUNTER — Ambulatory Visit: Payer: PPO | Admitting: Physician Assistant

## 2019-08-07 ENCOUNTER — Encounter: Payer: Self-pay | Admitting: Physician Assistant

## 2019-08-07 VITALS — BP 134/75 | HR 73 | Resp 12 | Ht 59.0 in | Wt 124.2 lb

## 2019-08-07 DIAGNOSIS — M797 Fibromyalgia: Secondary | ICD-10-CM | POA: Diagnosis not present

## 2019-08-07 DIAGNOSIS — M0579 Rheumatoid arthritis with rheumatoid factor of multiple sites without organ or systems involvement: Secondary | ICD-10-CM | POA: Diagnosis not present

## 2019-08-07 DIAGNOSIS — M19071 Primary osteoarthritis, right ankle and foot: Secondary | ICD-10-CM | POA: Diagnosis not present

## 2019-08-07 DIAGNOSIS — M5030 Other cervical disc degeneration, unspecified cervical region: Secondary | ICD-10-CM

## 2019-08-07 DIAGNOSIS — Z79899 Other long term (current) drug therapy: Secondary | ICD-10-CM | POA: Diagnosis not present

## 2019-08-07 DIAGNOSIS — R5383 Other fatigue: Secondary | ICD-10-CM | POA: Diagnosis not present

## 2019-08-07 DIAGNOSIS — G8929 Other chronic pain: Secondary | ICD-10-CM

## 2019-08-07 DIAGNOSIS — Z8709 Personal history of other diseases of the respiratory system: Secondary | ICD-10-CM | POA: Diagnosis not present

## 2019-08-07 DIAGNOSIS — M19072 Primary osteoarthritis, left ankle and foot: Secondary | ICD-10-CM

## 2019-08-07 DIAGNOSIS — M533 Sacrococcygeal disorders, not elsewhere classified: Secondary | ICD-10-CM

## 2019-08-07 DIAGNOSIS — M5136 Other intervertebral disc degeneration, lumbar region: Secondary | ICD-10-CM

## 2019-08-07 DIAGNOSIS — Z1589 Genetic susceptibility to other disease: Secondary | ICD-10-CM

## 2019-08-07 DIAGNOSIS — M7061 Trochanteric bursitis, right hip: Secondary | ICD-10-CM | POA: Diagnosis not present

## 2019-08-07 DIAGNOSIS — Z8669 Personal history of other diseases of the nervous system and sense organs: Secondary | ICD-10-CM

## 2019-08-07 DIAGNOSIS — M7062 Trochanteric bursitis, left hip: Secondary | ICD-10-CM

## 2019-08-07 DIAGNOSIS — M19041 Primary osteoarthritis, right hand: Secondary | ICD-10-CM | POA: Diagnosis not present

## 2019-08-07 DIAGNOSIS — M19042 Primary osteoarthritis, left hand: Secondary | ICD-10-CM

## 2019-08-07 DIAGNOSIS — Z8639 Personal history of other endocrine, nutritional and metabolic disease: Secondary | ICD-10-CM

## 2019-08-07 DIAGNOSIS — Z8659 Personal history of other mental and behavioral disorders: Secondary | ICD-10-CM

## 2019-08-07 LAB — CBC WITH DIFFERENTIAL/PLATELET
Absolute Monocytes: 644 cells/uL (ref 200–950)
Basophils Absolute: 81 cells/uL (ref 0–200)
Basophils Relative: 0.7 %
Eosinophils Absolute: 184 cells/uL (ref 15–500)
Eosinophils Relative: 1.6 %
HCT: 40.9 % (ref 35.0–45.0)
Hemoglobin: 13.8 g/dL (ref 11.7–15.5)
Lymphs Abs: 3945 cells/uL — ABNORMAL HIGH (ref 850–3900)
MCH: 32 pg (ref 27.0–33.0)
MCHC: 33.7 g/dL (ref 32.0–36.0)
MCV: 94.9 fL (ref 80.0–100.0)
MPV: 11.4 fL (ref 7.5–12.5)
Monocytes Relative: 5.6 %
Neutro Abs: 6647 cells/uL (ref 1500–7800)
Neutrophils Relative %: 57.8 %
Platelets: 423 10*3/uL — ABNORMAL HIGH (ref 140–400)
RBC: 4.31 10*6/uL (ref 3.80–5.10)
RDW: 13.8 % (ref 11.0–15.0)
Total Lymphocyte: 34.3 %
WBC: 11.5 10*3/uL — ABNORMAL HIGH (ref 3.8–10.8)

## 2019-08-07 LAB — COMPLETE METABOLIC PANEL WITH GFR
AG Ratio: 1.9 (calc) (ref 1.0–2.5)
ALT: 25 U/L (ref 6–29)
AST: 16 U/L (ref 10–35)
Albumin: 4.5 g/dL (ref 3.6–5.1)
Alkaline phosphatase (APISO): 51 U/L (ref 37–153)
BUN/Creatinine Ratio: 50 (calc) — ABNORMAL HIGH (ref 6–22)
BUN: 34 mg/dL — ABNORMAL HIGH (ref 7–25)
CO2: 24 mmol/L (ref 20–32)
Calcium: 10.2 mg/dL (ref 8.6–10.4)
Chloride: 106 mmol/L (ref 98–110)
Creat: 0.68 mg/dL (ref 0.50–0.99)
GFR, Est African American: 106 mL/min/{1.73_m2} (ref >=60)
GFR, Est Non African American: 91 mL/min/{1.73_m2} (ref >=60)
Globulin: 2.4 g/dL (calc) (ref 1.9–3.7)
Glucose, Bld: 135 mg/dL — ABNORMAL HIGH (ref 65–99)
Potassium: 4.2 mmol/L (ref 3.5–5.3)
Sodium: 139 mmol/L (ref 135–146)
Total Bilirubin: 0.2 mg/dL (ref 0.2–1.2)
Total Protein: 6.9 g/dL (ref 6.1–8.1)

## 2019-08-07 MED ORDER — LIDOCAINE 5 % EX PTCH: 1.0000 | MEDICATED_PATCH | CUTANEOUS | 0 refills | Status: DC

## 2019-08-07 MED ORDER — TRIAMCINOLONE ACETONIDE 40 MG/ML IJ SUSP
40.0000 mg | INTRAMUSCULAR | Status: AC | PRN
  Administered 2019-08-07: 40 mg via INTRA_ARTICULAR

## 2019-08-07 MED ORDER — LIDOCAINE HCL 1 % IJ SOLN
1.0000 mL | INTRAMUSCULAR | Status: AC | PRN
  Administered 2019-08-07: 1 mL

## 2019-08-07 NOTE — Patient Instructions (Signed)
Standing Labs We placed an order today for your standing lab work.   Please have your standing labs drawn in October and every 3 months  If possible, please have your labs drawn 2 weeks prior to your appointment so that the provider can discuss your results at your appointment.  We have open lab daily Monday through Thursday from 8:30-12:30 PM and 1:30-4:30 PM and Friday from 8:30-12:30 PM and 1:30-4:00 PM at the office of Dr. Shaili Deveshwar, [facility].   Please be advised, patients with office appointments requiring lab work will take precedents over walk-in lab work.  If possible, please come for your lab work on Monday and Friday afternoons, as you may experience shorter wait times. The office is located at [address], [address], [place], [place] [address] No appointment is necessary.   Labs are drawn by [facility]. Please bring your co-pay at the time of your lab draw.  You may receive a bill from [facility] for your lab work.  If you wish to have your labs drawn at another location, please call the office 24 hours in advance to send orders.  If you have any questions regarding directions or hours of operation,  please call [phone].   As a reminder, please drink plenty of water prior to coming for your lab work. Thanks!  

## 2019-08-11 NOTE — Progress Notes (Signed)
Labs are stable.  Glucose elevated-135.   WBC count elevated but trending down.  Plt count elevated but trending down.

## 2019-08-12 ENCOUNTER — Telehealth: Payer: Self-pay | Admitting: *Deleted

## 2019-08-12 DIAGNOSIS — J069 Acute upper respiratory infection, unspecified: Secondary | ICD-10-CM | POA: Diagnosis not present

## 2019-08-12 NOTE — Telephone Encounter (Signed)
Submitted a Prior Authorization request to [employer] for  Lidocaine Patches  via CoverMyMeds. Will update once we receive a response.    

## 2019-08-13 NOTE — Telephone Encounter (Signed)
Received notification from [facility] regarding a prior authorization for Lidocaine Patches. Authorization has been APPROVED from 08/12/2019 to 08/11/2020.

## 2019-08-25 ENCOUNTER — Other Ambulatory Visit (HOSPITAL_COMMUNITY): Payer: Self-pay | Admitting: Internal Medicine

## 2019-08-25 DIAGNOSIS — Z1231 Encounter for screening mammogram for malignant neoplasm of breast: Secondary | ICD-10-CM

## 2019-08-27 DIAGNOSIS — E119 Type 2 diabetes mellitus without complications: Secondary | ICD-10-CM | POA: Diagnosis not present

## 2019-08-27 DIAGNOSIS — G720 Drug-induced myopathy: Secondary | ICD-10-CM | POA: Diagnosis not present

## 2019-08-27 DIAGNOSIS — E782 Mixed hyperlipidemia: Secondary | ICD-10-CM | POA: Diagnosis not present

## 2019-08-27 DIAGNOSIS — J45909 Unspecified asthma, uncomplicated: Secondary | ICD-10-CM | POA: Diagnosis not present

## 2019-08-28 ENCOUNTER — Other Ambulatory Visit: Payer: Self-pay

## 2019-08-28 ENCOUNTER — Ambulatory Visit (HOSPITAL_COMMUNITY)
Admission: RE | Admit: 2019-08-28 | Discharge: 2019-08-28 | Disposition: A | Discharge status: Home / Self Care | Payer: PPO | Source: Ambulatory Visit | Attending: Internal Medicine | Admitting: Internal Medicine

## 2019-08-28 DIAGNOSIS — Z1231 Encounter for screening mammogram for malignant neoplasm of breast: Secondary | ICD-10-CM | POA: Diagnosis not present

## 2019-09-02 ENCOUNTER — Other Ambulatory Visit: Payer: Self-pay | Admitting: Rheumatology

## 2019-09-08 DIAGNOSIS — M5126 Other intervertebral disc displacement, lumbar region: Secondary | ICD-10-CM | POA: Diagnosis not present

## 2019-09-09 ENCOUNTER — Ambulatory Visit: Payer: PPO | Admitting: Rheumatology

## 2019-09-21 ENCOUNTER — Ambulatory Visit: Payer: PPO | Admitting: Allergy and Immunology

## 2019-09-23 ENCOUNTER — Encounter: Payer: Self-pay | Admitting: Allergy & Immunology

## 2019-09-23 ENCOUNTER — Ambulatory Visit: Payer: PPO | Admitting: Allergy & Immunology

## 2019-09-23 ENCOUNTER — Other Ambulatory Visit: Payer: Self-pay

## 2019-09-23 VITALS — BP 102/60 | HR 92 | Temp 97.2°F | Resp 16 | Wt 123.2 lb

## 2019-09-23 DIAGNOSIS — J310 Chronic rhinitis: Secondary | ICD-10-CM | POA: Diagnosis not present

## 2019-09-23 DIAGNOSIS — J4540 Moderate persistent asthma, uncomplicated: Secondary | ICD-10-CM

## 2019-09-23 DIAGNOSIS — J3489 Other specified disorders of nose and nasal sinuses: Secondary | ICD-10-CM

## 2019-09-23 NOTE — Progress Notes (Signed)
FOLLOW UP  Date of Service/Encounter:  09/23/19   Assessment:   Moderate persistent asthma, uncomplicated  Chronic rhinitis  Nasal sore  Plan/Recommendations:   1. Moderate persistent asthma, uncomplicated - Lung testing deferred since you were so stable. - We will not make any medication changes at this time. - Daily controller medication(s): Breo 200/69mcg one puff once daily - Prior to physical activity: albuterol 2 puffs 10-15 minutes before physical activity. - Rescue medications: albuterol 4 puffs every 4-6 hours as needed - Asthma control goals:  * Full participation in all desired activities (may need albuterol before activity) * Albuterol use two time or less a week on average (not counting use with activity) * Cough interfering with sleep two time or less a month * Oral steroids no more than once a year * No hospitalizations  2. Chronic rhinitis - Stop the ipratropium and start azelastine one spray per nostril up to twice daily.  - Continue with the fluticasone one spray per nostril up to twice daily.  - Continue with Xyzla $RemoveBe'5mg'NTSRrOamx$  daily.  - We may consider allergy shots in the future. - We are refer you to to ENT to help with this sore in the right nostril.   3. Return in about 3 months (around 12/24/2019). This can be an in-person, a virtual Webex or a telephone follow up visit.  Subjective:   Stephanie Norris is a 64 y.o. female presenting today for follow up of  Chief Complaint  Patient presents with   Follow-up   Asthma    PENNIE VANBLARCOM has a history of the following: Patient Active Problem List   Diagnosis Date Noted   Vertigo 08/20/2016   Eustachian tube dysfunction 04/17/2016   Abdominal pain 03/02/2016   Gastroenteritis 03/02/2016   Rheumatoid arthritis with rheumatoid factor of multiple sites without organ or systems involvement ([place]) 01/13/2016   HLA B27 (HLA B27 positive) 01/13/2016   Osteoarthritis of foot 01/13/2016   DJD  (degenerative joint disease), cervical 01/13/2016   Spondylosis of lumbar region without myelopathy or radiculopathy 01/13/2016   Primary osteoarthritis of both hands 01/13/2016   Non-seasonal allergic rhinitis 08/13/2014   Stiffness of joints, not elsewhere classified, multiple sites 08/26/2013   Flu-like symptoms 03/02/2013   Lactic acidosis 03/02/2013   Sinus tachycardia 03/02/2013   Elevated lactic acid level 03/02/2013   Influenza due to identified novel influenza A virus with other respiratory manifestations 03/02/2013   Hyperglycemia 03/02/2013   Fibromyalgia    OSA (obstructive sleep apnea) 06/22/2011   DOE (dyspnea on exertion) 05/30/2011   Moderate persistent asthma 03/30/2011    History obtained from: chart review and patient.  Stephanie Norris is a 64 y.o. female presenting for a follow up visit.  She is normally followed by Dr. Verlin Fester.  She was last seen in February 2021 by our nurse practitioner.  At that time, she was continued on Breo 200/25 mcg 1 puff once daily as well as montelukast and albuterol.  For her allergic rhinitis, she was continued on Flonase as well as nasal Atrovent.  Nasal saline gel was recommended.  Her past testing has been positive to molds and ragweed.  Her reflux was controlled with Protonix once daily.  She also had a sore in her nostril which was treated with Polysporin.  Since the last visit, she has done pretty good. Every once in a blue moon she wilol need to use her inhaler but this is rarely.   Asthma/Respiratory Symptom History: She remains on  Breo 200/25 one puff once daily. This is $55. She is now in the donut hole. It was $45 when she was working before [employer]. She feesl that if she has a BID medication, she will forgot to do that. Her rescue inhalers typically go out of date.  Allergic Rhinitis Symptom History: Nasal symptoms are not under good control. She reports that she constnatly has postnasal drip. She reports that there are  large globs of mucous with some hard materials within it. Currently she is on ipratropium every morning. This is hard to tell whether it works or not. She is not convinced that it does much. She is also on the fluticasone in the morning at the same time. She has been on shots in the past. She is unsure when she stopped but she reports that it did help with her symptoms. She does report a sore in her right nostril that has been crusty and gets scabby routinely. She has not seen ENT for this.   She worked at [employer]. She has retired now.   Otherwise, there have been no changes to her past medical history, surgical history, family history, or social history.    Review of Systems  Constitutional: Negative.  Negative for chills, fever, malaise/fatigue and weight loss.  HENT: Positive for congestion. Negative for ear discharge, ear pain and sinus pain.        Positive for bleeding from her right nostril. Positive for rhinorrhea.   Eyes: Negative for pain, discharge and redness.  Respiratory: Negative for cough, sputum production, shortness of breath and wheezing.   Cardiovascular: Negative.  Negative for chest pain and palpitations.  Gastrointestinal: Negative for abdominal pain, constipation, diarrhea, heartburn, nausea and vomiting.  Skin: Negative.  Negative for itching and rash.  Neurological: Negative for dizziness and headaches.  Endo/Heme/Allergies: Positive for environmental allergies. Does not bruise/bleed easily.       Objective:   Blood pressure 102/60, pulse 92, temperature (!) 97.2 F (36.2 C), temperature source Temporal, resp. rate 16, weight 123 lb 3.2 oz (55.9 kg), SpO2 98 %. Body mass index is 24.88 kg/m.   Physical Exam:  Physical Exam Constitutional:      Appearance: She is well-developed.  HENT:     Head: Normocephalic and atraumatic.     Right Ear: Tympanic membrane, ear canal and external ear normal.     Left Ear: Tympanic membrane, ear canal and  external ear normal.     Nose: No nasal deformity, septal deviation, mucosal edema or rhinorrhea.     Right Turbinates: Enlarged, swollen and pale.     Left Turbinates: Enlarged, swollen and pale.     Right Sinus: No maxillary sinus tenderness or frontal sinus tenderness.     Left Sinus: No maxillary sinus tenderness or frontal sinus tenderness.     Comments: Sore present in the right nostril.     Mouth/Throat:     Mouth: Mucous membranes are not pale and not dry.     Pharynx: Uvula midline.     Comments: Cobblestoning present in the posterior oropharynx.  Eyes:     General:        Right eye: No discharge.        Left eye: No discharge.     Conjunctiva/sclera: Conjunctivae normal.     Right eye: Right conjunctiva is not injected. No chemosis.    Left eye: Left conjunctiva is not injected. No chemosis.    Pupils: Pupils are equal, round, and reactive to  light.  Cardiovascular:     Rate and Rhythm: Normal rate and regular rhythm.     Heart sounds: Normal heart sounds.  Pulmonary:     Effort: Pulmonary effort is normal. No tachypnea, accessory muscle usage or respiratory distress.     Breath sounds: Normal breath sounds. No wheezing, rhonchi or rales.  Chest:     Chest wall: No tenderness.  Lymphadenopathy:     Cervical: No cervical adenopathy.  Skin:    Coloration: Skin is not pale.     Findings: No abrasion, erythema, petechiae or rash. Rash is not papular, urticarial or vesicular.     Comments: No eczematous or urticarial lesions noted.   Neurological:     Mental Status: She is alert.      Diagnostic studies: none     Salvatore Marvel, MD  [facility]

## 2019-09-23 NOTE — Patient Instructions (Addendum)
1. Moderate persistent asthma, uncomplicated - Lung testing deferred since you were so stable. - We will not make any medication changes at this time. - Daily controller medication(s): Breo 200/81mcg one puff once daily - Prior to physical activity: albuterol 2 puffs 10-15 minutes before physical activity. - Rescue medications: albuterol 4 puffs every 4-6 hours as needed - Asthma control goals:  * Full participation in all desired activities (may need albuterol before activity) * Albuterol use two time or less a week on average (not counting use with activity) * Cough interfering with sleep two time or less a month * Oral steroids no more than once a year * No hospitalizations  2. Chronic rhinitis - Stop the ipratropium and start azelastine one spray per nostril up to twice daily.  - Continue with the fluticasone one spray per nostril up to twice daily.  - Continue with Xyzla 5mg  daily.  - We may consider allergy shots in the future. - We are refer you to to ENT to help with this sore in the right nostril.   3. Return in about 3 months (around 12/24/2019). This can be an in-person, a virtual Webex or a telephone follow up visit.   Please inform us of any Emergency Department visits, hospitalizations, or changes in symptoms. Call us before going to the ED for breathing or allergy symptoms since we might be able to fit you in for a sick visit. Feel free to contact us anytime with any questions, problems, or concerns.  It was a pleasure to meet you today!  Websites that have reliable patient information: 1. American Academy of Asthma, Allergy, and Immunology: www.aaaai.org 2. Food Allergy Research and Education (FARE): foodallergy.org 3. Mothers of Asthmatics: http://www.asthmacommunitynetwork.org 4. American College of Allergy, Asthma, and Immunology: www.acaai.org   COVID-19 Vaccine Information can be found at:  [email or web address].uk For questions related to vaccine distribution or appointments, please email [email or web address] or call [phone].     Like [place] on [place] and Instagram for our latest updates!        Make sure you are registered to vote! If you have moved or changed any of your contact information, you will need to get this updated before voting!  In some cases, you MAY be able to register to vote online: [email or web address]

## 2019-09-24 DIAGNOSIS — E1142 Type 2 diabetes mellitus with diabetic polyneuropathy: Secondary | ICD-10-CM | POA: Diagnosis not present

## 2019-09-28 ENCOUNTER — Telehealth: Payer: Self-pay

## 2019-09-28 NOTE — Telephone Encounter (Signed)
Referral placed to [facility] for review & scheduling.  I left a detailed voicemail for the patient.   [address] [place], [place] [address] [phone]  Thanks

## 2019-09-28 NOTE — Telephone Encounter (Signed)
-----   Message from Valentina Shaggy, MD sent at 09/23/2019  1:17 PM EDT ----- ENT referral placed.

## 2019-09-29 ENCOUNTER — Other Ambulatory Visit: Payer: Self-pay

## 2019-09-29 ENCOUNTER — Ambulatory Visit
Admission: EM | Admit: 2019-09-29 | Discharge: 2019-09-29 | Disposition: A | Discharge status: Home / Self Care | Payer: PPO

## 2019-09-29 NOTE — ED Provider Notes (Signed)
Patient declined visit as there is no ultrasound on site   Stephanie Norris, [place] 09/29/19 [phone]

## 2019-09-29 NOTE — ED Triage Notes (Signed)
Pt presents with left leg swelling that started on Friday. Pt denies any pain. States there is some tightness in the ankle and foot area. Pt denies an injury.

## 2019-09-30 DIAGNOSIS — R600 Localized edema: Secondary | ICD-10-CM | POA: Diagnosis not present

## 2019-09-30 MED ORDER — AZELASTINE HCL 0.1 % NA SOLN: 1.0000 | Freq: Two times a day (BID) | NASAL | 5 refills | Status: DC

## 2019-09-30 NOTE — Addendum Note (Signed)
Addended by: Herbie Drape on: 09/30/2019 05:47 PM   Modules accepted: Orders

## 2019-10-12 ENCOUNTER — Other Ambulatory Visit: Payer: Self-pay | Admitting: Physician Assistant

## 2019-10-13 NOTE — Telephone Encounter (Signed)
Last visit: 08/07/2019 Next visit: 01/08/2020  Okay to refill per Dr. Estanislado Pandy

## 2019-10-20 DIAGNOSIS — N3946 Mixed incontinence: Secondary | ICD-10-CM | POA: Diagnosis not present

## 2019-10-20 DIAGNOSIS — N3020 Other chronic cystitis without hematuria: Secondary | ICD-10-CM | POA: Diagnosis not present

## 2019-10-26 DIAGNOSIS — I10 Essential (primary) hypertension: Secondary | ICD-10-CM | POA: Diagnosis not present

## 2019-10-26 DIAGNOSIS — E7849 Other hyperlipidemia: Secondary | ICD-10-CM | POA: Diagnosis not present

## 2019-10-26 DIAGNOSIS — E1140 Type 2 diabetes mellitus with diabetic neuropathy, unspecified: Secondary | ICD-10-CM | POA: Diagnosis not present

## 2019-10-26 DIAGNOSIS — F331 Major depressive disorder, recurrent, moderate: Secondary | ICD-10-CM | POA: Diagnosis not present

## 2019-10-27 DIAGNOSIS — Z9889 Other specified postprocedural states: Secondary | ICD-10-CM | POA: Diagnosis not present

## 2019-10-27 DIAGNOSIS — M545 Low back pain: Secondary | ICD-10-CM | POA: Diagnosis not present

## 2019-10-27 DIAGNOSIS — M542 Cervicalgia: Secondary | ICD-10-CM | POA: Diagnosis not present

## 2019-10-27 DIAGNOSIS — R030 Elevated blood-pressure reading, without diagnosis of hypertension: Secondary | ICD-10-CM | POA: Diagnosis not present

## 2019-10-29 DIAGNOSIS — N3946 Mixed incontinence: Secondary | ICD-10-CM | POA: Diagnosis not present

## 2019-10-29 DIAGNOSIS — R350 Frequency of micturition: Secondary | ICD-10-CM | POA: Diagnosis not present

## 2019-10-30 ENCOUNTER — Encounter (HOSPITAL_COMMUNITY): Payer: Self-pay | Admitting: Physical Therapy

## 2019-10-30 ENCOUNTER — Ambulatory Visit (HOSPITAL_COMMUNITY): Payer: PPO | Attending: Student | Admitting: Physical Therapy

## 2019-10-30 ENCOUNTER — Other Ambulatory Visit: Payer: Self-pay

## 2019-10-30 DIAGNOSIS — M5442 Lumbago with sciatica, left side: Secondary | ICD-10-CM | POA: Diagnosis not present

## 2019-10-30 DIAGNOSIS — M6281 Muscle weakness (generalized): Secondary | ICD-10-CM | POA: Insufficient documentation

## 2019-10-30 DIAGNOSIS — G8929 Other chronic pain: Secondary | ICD-10-CM | POA: Diagnosis not present

## 2019-10-30 DIAGNOSIS — R262 Difficulty in walking, not elsewhere classified: Secondary | ICD-10-CM | POA: Insufficient documentation

## 2019-10-30 DIAGNOSIS — M542 Cervicalgia: Secondary | ICD-10-CM | POA: Insufficient documentation

## 2019-10-30 NOTE — Therapy (Signed)
Stephanie Norris, [place], [address] Phone: [phone]   Fax:  [phone]  Physical Therapy Evaluation  Patient Details  Name: Stephanie Norris MRN: [id number] Date of Birth: Dec 10, 1955 Referring Provider (PT): Viona Gilmore   Encounter Date: 10/30/2019   PT End of Session - 10/30/19 1311    Visit Number 1    Number of Visits 12    Date for PT Re-Evaluation 12/11/19    Authorization Type visits based on med nec. $15 co pay, no auth req.    Progress Note Due on Visit 10    PT Start Time 1310    PT Stop Time 1345    PT Time Calculation (min) 35 min    Activity Tolerance Patient limited by pain    Behavior During Therapy [facility] for tasks assessed/performed           Past Medical History:  Diagnosis Date  . Asthma   . COPD (chronic obstructive pulmonary disease) ([place])   . Diabetes mellitus without complication (HCC)    Type 2  . Dizziness   . Fibromyalgia   . GERD (gastroesophageal reflux disease)   . Headache   . Hyperlipidemia   . OSA (obstructive sleep apnea)   . Osteoarthritis   . PCOS (polycystic ovarian syndrome)   . Raynaud disease   . Rheumatoid arthritis [facility])     Past Surgical History:  Procedure Laterality Date  . APPENDECTOMY    . BACK SURGERY  2021  . CHOLECYSTECTOMY    . COLONOSCOPY W/ BIOPSIES  fall 2009  . COMBINED HYSTERECTOMY VAGINAL / OOPHORECTOMY / A&P REPAIR  10/2002   endometriosis, cystocele, fibroids  . KNEE SURGERY  1996  . TOE SURGERY    . VESICOVAGINAL FISTULA CLOSURE W/ TAH      There were no vitals filed for this visit.    Subjective Assessment - 10/30/19 1325    Subjective States that back in January she went to a funeral outside and walked around a good bit and her back was already hurting and she went to kick her cat and she heard a pop and it really [place]. States that at one time she was in so much pain she couldn't even walk. States that she went to the MD and he did  surgery on 06/03/19 but she still has a spot that is really are on the left side. States that her left leg is really weak and she can't go up and down steps. States that if she can touch the railing she can move that leg and go up. States she has 4 steps going into the home with a railing. States no steps in home.  States getting up out of chair is difficult. Or if she sits in a chair that touches that sensitive spot it is hard to get comfortable. States neck is in a lot pain as well will focus on the back first as this prevents her from standing and going up stairs. States that the PA from Dr. Arnoldo Morale office on Tuesday 10/27/19 told her to contact primary care MD about the swelling in her legs. States she has not had a chance.    Pertinent History DB, fibromyalgia, RA, COPD, neck pain, bilateral neuropathy    Limitations Sitting;Lifting;Standing;Walking;House hold activities    Currently in Pain? Yes    Pain Location Back    Pain Orientation Mid;Lower;Left    Pain Descriptors / Indicators Aching;Dull    Pain Radiating  Towards down the hip and into the front of the left thigh.    Pain Onset More than a month ago    Aggravating Factors  stairs, standing, touching sensitive spot    Pain Relieving Factors heat helps and medication              [facility] PT Assessment - 10/30/19 0001      Assessment   Medical Diagnosis LBP and neck pain    Referring Provider (PT) [place]   Has the patient fallen in the past 6 months Yes    How many times? 1   right after surgery - accidently stepped on cat   Has the patient had a decrease in activity level because of a fear of falling?  Yes      [place] residence    Available Help at Discharge [place] to enter    Entrance Stairs-Number of Steps 4    [place] One level    [place] - single point;Walker - 2 wheels    Additional  Comments used assitive device right after surgery      Prior Function   Level of Independence Independent      Cognition   Overall Cognitive Status Within Functional Limits for tasks assessed      Observation/Other Assessments   Observations bilateral pitting edema 4+   swelling about 3 weeks ago   Focus on Therapeutic Outcomes (FOTO)  42.2% function       AROM   Right Hip Extension 15    Left Hip Extension 15    Lumbar Flexion 75% limited   catches on left side    Lumbar Extension 100% limited   feels good   Lumbar - Right Side Bend 75% limited   pulls/stretchs on left side   Lumbar - Left Side Bend 75% limited   no change in symptoms.      Strength   Strength Assessment Site Ankle    Right Hip Flexion 4+/5    Right Hip Extension 4+/5    Left Hip Flexion 4-/5    Left Hip Extension 4/5   pulling in buttocks   Right Knee Flexion 4/5    Right Knee Extension 4+/5    Left Knee Flexion 3+/5    Left Knee Extension 4-/5    Right/Left Ankle Right;Left    Right Ankle Dorsiflexion 5/5    Left Ankle Dorsiflexion 5/5      Palpation   Spinal mobility hypomobility noted throughout lumbar spine - unable to tolerate more than light pressure.    Palpation comment not assessed per patient request secondary to sensitivity (spot most sensitive at right side of base of sacrum)      Special Tests   Other special tests ely's test -pulling L>R       Ambulation/Gait   Ambulation/Gait Yes    Ambulation/Gait Assistance 6: Modified independent (Device/Increase time)    Ambulation Distance (Feet) 282 Feet    Assistive device None    Gait Pattern Decreased step length - left;Decreased step length - right;Decreased hip/knee flexion - left;Decreased hip/knee flexion - right;Trunk flexed;Decreased trunk rotation    Ambulation Surface Level;Indoor    Gait velocity decreased    Stairs Yes    Stairs Assistance 5: Supervision    Stair Management Technique One rail Right;Alternating pattern   slow  labored movements   Number of Stairs 4    Height of Stairs 7    Gait Comments weakness noted in left leg where she had to think about picking up her leg. nagging pain in low back started at 1:28                      Objective measurements completed on examination: See above findings.       [facility] Adult PT Treatment/Exercise - 10/30/19 0001      Lumbar Exercises: Prone   Straight Leg Raise 10 reps;5 seconds   B                 PT Education - 10/30/19 1348    Education Details on current condition, POC, HEP and answered all questions.    Person(s) Educated Patient    Methods Explanation    Comprehension Verbalized understanding            PT Short Term Goals - 10/30/19 1326      PT SHORT TERM GOAL #1   Title Patient will be independent in self management strategies to improve quality of life and functional outcomes.    Time 3    Period Weeks    Status New    Target Date 11/20/19      PT SHORT TERM GOAL #2   Title Patient will report at least 25% improvement in overall symptoms and/or functional ability.    Time 3    Period Weeks    Status New    Target Date 11/20/19             PT Long Term Goals - 10/30/19 1335      PT LONG TERM GOAL #1   Title Patient will improve on FOTO score to meet predicted outcomes to demonstrate improved functional mobility.    Time 6    Period Weeks    Status New    Target Date 12/11/19      PT LONG TERM GOAL #2   Title Patient will be able to ascend and descend stairs without use of railing to improve ability for patient to go up and down stairs at home.    Time 6    Status New    Target Date 12/11/19      PT LONG TERM GOAL #3   Title Patient will report at least 50% improvement in overall symptoms and/or functional ability    Time 6    Period Weeks    Status New    Target Date 12/11/19                  Plan - 10/30/19 1350    Clinical Impression Statement Patient presents with chronic back  pain that started in January of this year. She tried PT a couple of times but ultimately decided to undergo low back surgery in April 2021. Since then she has had weakness in her left leg and pain in her low back that prevents her performing functional activities at home and in the community. Pitting edema noted bilaterally during today's session and patient strongly encouraged to follow up with primary care MD about edema. Patient also has order for neck pain but would like to focus on lumbar spine at this time. Will transition to cervical spine when lumbar symptoms have improved. Patient would benefit from skilled physical therapy to address physical impairments and return her to optimal function.    Personal Factors and Comorbidities  Age;Comorbidity 1;Comorbidity 2;Comorbidity 3+    Comorbidities RA, fibromyalgia, DB, COPD, hx left knee surgery, bilateral neuopathy    Examination-Activity Limitations Stand;Squat;Sit;Sleep;Bathing;Bed Mobility;Bend;Stairs;Carry;Toileting;Transfers;Hygiene/Grooming;Lift;Locomotion Level    Examination-Participation Restrictions Cleaning;Shop;Driving;Community Activity;Meal Prep    Stability/Clinical Decision Making Stable/Uncomplicated    Clinical Decision Making Low    Rehab Potential Good    PT Frequency 2x / week    PT Duration 6 weeks    PT Treatment/Interventions ADLs/Self Care Home Management;Aquatic Therapy;Biofeedback;Cryotherapy;Electrical Stimulation;Moist Heat;Traction;Balance training;Therapeutic exercise;Therapeutic activities;Functional mobility training;Stair training;Gait training;DME Instruction;Neuromuscular re-education;Cognitive remediation;Patient/family education;Manual techniques;Dry needling;Joint Manipulations;Passive range of motion;Spinal Manipulations    PT Next Visit Plan glute strengthening, lumbar mobility. assess cervical spine and address when lumbar symptoms resolved    PT Home Exercise Plan hip extension in prone    Consulted and  Agree with Plan of Care Patient           Patient will benefit from skilled therapeutic intervention in order to improve the following deficits and impairments:  Abnormal gait, Decreased endurance, Decreased balance, Decreased mobility, Difficulty walking, Decreased cognition, Decreased range of motion, Decreased coordination, Improper body mechanics, Decreased activity tolerance, Decreased strength, Pain, Hypomobility  Visit Diagnosis: Chronic midline low back pain with left-sided sciatica  Difficulty in walking, not elsewhere classified  Muscle weakness (generalized)  Cervicalgia     Problem List Patient Active Problem List   Diagnosis Date Noted  . Vertigo 08/20/2016  . Eustachian tube dysfunction 04/17/2016  . Abdominal pain 03/02/2016  . Gastroenteritis 03/02/2016  . Rheumatoid arthritis with rheumatoid factor of multiple sites without organ or systems involvement ([place]) 01/13/2016  . HLA B27 (HLA B27 positive) 01/13/2016  . Osteoarthritis of foot 01/13/2016  . DJD (degenerative joint disease), cervical 01/13/2016  . Spondylosis of lumbar region without myelopathy or radiculopathy 01/13/2016  . Primary osteoarthritis of both hands 01/13/2016  . Non-seasonal allergic rhinitis 08/13/2014  . Stiffness of joints, not elsewhere classified, multiple sites 08/26/2013  . Flu-like symptoms 03/02/2013  . Lactic acidosis 03/02/2013  . Sinus tachycardia 03/02/2013  . Elevated lactic acid level 03/02/2013  . Influenza due to identified novel influenza A virus with other respiratory manifestations 03/02/2013  . Hyperglycemia 03/02/2013  . Fibromyalgia   . OSA (obstructive sleep apnea) 06/22/2011  . DOE (dyspnea on exertion) 05/30/2011  . Moderate persistent asthma 03/30/2011   1:55 PM, 10/30/19 Jerene Pitch, DPT Physical Therapy with [facility]  [phone] office  [place] [address] [place],  [place], [address] Phone: [phone]   Fax:  [phone]  Name: RONNIKA COLLETT MRN: [id number] Date of Birth: September 03, 1955

## 2019-10-30 NOTE — Patient Instructions (Signed)
Access Code: [facility] URL: [email or web address] Date: 10/30/2019 Prepared by: Yetta Glassman  Exercises Prone Hip Extension - 1 x daily - 7 x weekly - 3 sets - 10 reps - 5 hold

## 2019-11-04 ENCOUNTER — Ambulatory Visit (HOSPITAL_COMMUNITY): Payer: PPO | Admitting: Physical Therapy

## 2019-11-04 ENCOUNTER — Other Ambulatory Visit: Payer: Self-pay

## 2019-11-04 ENCOUNTER — Encounter (HOSPITAL_COMMUNITY): Payer: Self-pay | Admitting: Physical Therapy

## 2019-11-04 DIAGNOSIS — E782 Mixed hyperlipidemia: Secondary | ICD-10-CM | POA: Diagnosis not present

## 2019-11-04 DIAGNOSIS — Z0000 Encounter for general adult medical examination without abnormal findings: Secondary | ICD-10-CM | POA: Diagnosis not present

## 2019-11-04 DIAGNOSIS — G8929 Other chronic pain: Secondary | ICD-10-CM

## 2019-11-04 DIAGNOSIS — J069 Acute upper respiratory infection, unspecified: Secondary | ICD-10-CM | POA: Diagnosis not present

## 2019-11-04 DIAGNOSIS — M5442 Lumbago with sciatica, left side: Secondary | ICD-10-CM

## 2019-11-04 DIAGNOSIS — R600 Localized edema: Secondary | ICD-10-CM | POA: Diagnosis not present

## 2019-11-04 DIAGNOSIS — R262 Difficulty in walking, not elsewhere classified: Secondary | ICD-10-CM

## 2019-11-04 DIAGNOSIS — E1142 Type 2 diabetes mellitus with diabetic polyneuropathy: Secondary | ICD-10-CM | POA: Diagnosis not present

## 2019-11-04 DIAGNOSIS — J309 Allergic rhinitis, unspecified: Secondary | ICD-10-CM | POA: Diagnosis not present

## 2019-11-04 DIAGNOSIS — K219 Gastro-esophageal reflux disease without esophagitis: Secondary | ICD-10-CM | POA: Diagnosis not present

## 2019-11-04 DIAGNOSIS — Z6824 Body mass index (BMI) 24.0-24.9, adult: Secondary | ICD-10-CM | POA: Diagnosis not present

## 2019-11-04 DIAGNOSIS — G9009 Other idiopathic peripheral autonomic neuropathy: Secondary | ICD-10-CM | POA: Diagnosis not present

## 2019-11-04 DIAGNOSIS — E7849 Other hyperlipidemia: Secondary | ICD-10-CM | POA: Diagnosis not present

## 2019-11-04 DIAGNOSIS — M6281 Muscle weakness (generalized): Secondary | ICD-10-CM

## 2019-11-04 DIAGNOSIS — M21371 Foot drop, right foot: Secondary | ICD-10-CM | POA: Diagnosis not present

## 2019-11-04 DIAGNOSIS — J45909 Unspecified asthma, uncomplicated: Secondary | ICD-10-CM | POA: Diagnosis not present

## 2019-11-04 DIAGNOSIS — E119 Type 2 diabetes mellitus without complications: Secondary | ICD-10-CM | POA: Diagnosis not present

## 2019-11-04 NOTE — Patient Instructions (Signed)
Access Code: [email or web address] URL: [email or web address] Date: 11/04/2019 Prepared by: Yetta Glassman  Exercises Seated Hip Adduction Isometrics with Diona Foley - 1 x daily - 7 x weekly - 2 sets - 10 reps - 5 hold Seated Hip Abduction with Resistance - 1 x daily - 7 x weekly - 2 sets - 10 reps - 5 hold Seated Gluteal Sets - 1 x daily - 7 x weekly - 2 sets - 10 reps - 5 hold Standing Hip Extension with Counter Support - 1 x daily - 7 x weekly - 2 sets - 10 reps Standing Hip Abduction with Counter Support - 1 x daily - 7 x weekly - 2 sets - 10 reps Standing March with Counter Support - 1 x daily - 7 x weekly - 2 sets - 10 reps

## 2019-11-04 NOTE — Therapy (Signed)
[place], [place], [address] Phone: [phone]   Fax:  [phone]  Physical Therapy Treatment  Patient Details  Name: Stephanie Norris MRN: [id number] Date of Birth: 13-Feb-1955 Referring Provider (PT): Viona Gilmore   Encounter Date: 11/04/2019   PT End of Session - 11/04/19 1534    Visit Number 2    Number of Visits 12    Date for PT Re-Evaluation 12/11/19    Authorization Type visits based on med nec. $15 co pay, no auth req.    Progress Note Due on Visit 10    PT Start Time 1532    PT Stop Time 1610    PT Time Calculation (min) 38 min    Activity Tolerance Patient limited by pain    Behavior During Therapy [facility] for tasks assessed/performed           Past Medical History:  Diagnosis Date  . Asthma   . COPD (chronic obstructive pulmonary disease) ([place])   . Diabetes mellitus without complication (HCC)    Type 2  . Dizziness   . Fibromyalgia   . GERD (gastroesophageal reflux disease)   . Headache   . Hyperlipidemia   . OSA (obstructive sleep apnea)   . Osteoarthritis   . PCOS (polycystic ovarian syndrome)   . Raynaud disease   . Rheumatoid arthritis [facility])     Past Surgical History:  Procedure Laterality Date  . APPENDECTOMY    . BACK SURGERY  2021  . CHOLECYSTECTOMY    . COLONOSCOPY W/ BIOPSIES  fall 2009  . COMBINED HYSTERECTOMY VAGINAL / OOPHORECTOMY / A&P REPAIR  10/2002   endometriosis, cystocele, fibroids  . KNEE SURGERY  1996  . TOE SURGERY    . VESICOVAGINAL FISTULA CLOSURE W/ TAH      There were no vitals filed for this visit.   Subjective Assessment - 11/04/19 1538    Subjective States that she fell yesterday/last night when stepping backwards on left leg and fell on her butt and then went on back as her left leg still can't hold her. States that she is in a lot of pain in her butt. States that pain is currently 4/10. States her exercises were going alright up until that point.  States that her exercises don't really seem to be pulling much.    Pertinent History DB, fibromyalgia, RA, COPD, neck pain, bilateral neuropathy    Limitations Sitting;Lifting;Standing;Walking;House hold activities    Currently in Pain? Yes    Pain Location Buttocks    Pain Orientation Left;Right    Pain Descriptors / Indicators Aching;Dull    Pain Type Acute pain    Pain Onset More than a month ago              [facility] PT Assessment - 11/04/19 0001      Assessment   Medical Diagnosis LBP and neck pain    Referring Provider (PT) Viona Gilmore      Balance Screen   Has the patient fallen in the past 6 months Yes    How many times? 2                         [facility] Adult PT Treatment/Exercise - 11/04/19 0001      Exercises   Exercises Knee/Hip      Lumbar Exercises: Seated   Other Seated Lumbar Exercises --      Knee/Hip Exercises: Standing  Hip Flexion AROM;Stengthening;Both;2 sets;10 reps;Knee bent   UE support   Hip Abduction 2 sets;10 reps;AROM;Stengthening;Both;Knee bent   UE support   Hip Extension AROM;Stengthening;Both;2 sets;10 reps;Knee bent   UE support   Functional Squat 4 sets;5 reps;3 seconds   UE support     Knee/Hip Exercises: Seated   Other Seated Knee/Hip Exercises butt squeeze 2x10 5" holds    Abduction/Adduction  Strengthening;Both;3 sets;10 reps   belt and towel                    PT Short Term Goals - 10/30/19 1326      PT SHORT TERM GOAL #1   Title Patient will be independent in self management strategies to improve quality of life and functional outcomes.    Time 3    Period Weeks    Status New    Target Date 11/20/19      PT SHORT TERM GOAL #2   Title Patient will report at least 25% improvement in overall symptoms and/or functional ability.    Time 3    Period Weeks    Status New    Target Date 11/20/19             PT Long Term Goals - 10/30/19 1335      PT LONG TERM GOAL #1   Title Patient will  improve on FOTO score to meet predicted outcomes to demonstrate improved functional mobility.    Time 6    Period Weeks    Status New    Target Date 12/11/19      PT LONG TERM GOAL #2   Title Patient will be able to ascend and descend stairs without use of railing to improve ability for patient to go up and down stairs at home.    Time 6    Status New    Target Date 12/11/19      PT LONG TERM GOAL #3   Title Patient will report at least 50% improvement in overall symptoms and/or functional ability    Time 6    Period Weeks    Status New    Target Date 12/11/19                 Plan - 11/04/19 1610    Clinical Impression Statement Focused initially on isometric strength secondary to increase in buttocks pain with recent fall. Exercises did not bother patient's current symptoms. Transitioned to standing 3 way hip to improve balance and standing functional strength and this was also tolerated moderately well with no reports of increased pain but mild difficulties with standing secondary to a twinge in muscle that patient reported was not pain.  Fatigue noted end of session but symptoms reported as the same. Discussed following up with MD about recent fall ad patient reported she had seen the MD this morning and told them about the fall and they did not seem concerned about.    Personal Factors and Comorbidities Age;Comorbidity 1;Comorbidity 2;Comorbidity 3+    Comorbidities RA, fibromyalgia, DB, COPD, hx left knee surgery, bilateral neuopathy    Examination-Activity Limitations Stand;Squat;Sit;Sleep;Bathing;Bed Mobility;Bend;Stairs;Carry;Toileting;Transfers;Hygiene/Grooming;Lift;Locomotion Level    Examination-Participation Restrictions Cleaning;Shop;Driving;Community Activity;Meal Prep    Stability/Clinical Decision Making Stable/Uncomplicated    Rehab Potential Good    PT Frequency 2x / week    PT Duration 6 weeks    PT Treatment/Interventions ADLs/Self Care Home  Management;Aquatic Therapy;Biofeedback;Cryotherapy;Electrical Stimulation;Moist Heat;Traction;Balance training;Therapeutic exercise;Therapeutic activities;Functional mobility training;Stair training;Gait training;DME Instruction;Neuromuscular re-education;Cognitive remediation;Patient/family education;Manual techniques;Dry needling;Joint Manipulations;Passive  range of motion;Spinal Manipulations    PT Next Visit Plan glute strengthening, lumbar mobility. assess cervical spine and address when lumbar symptoms resolved    PT Home Exercise Plan hip extension in prone; Standing 3 way hip, hip abd/add iso, hip ext iso    Consulted and Agree with Plan of Care Patient           Patient will benefit from skilled therapeutic intervention in order to improve the following deficits and impairments:  Abnormal gait, Decreased endurance, Decreased balance, Decreased mobility, Difficulty walking, Decreased cognition, Decreased range of motion, Decreased coordination, Improper body mechanics, Decreased activity tolerance, Decreased strength, Pain, Hypomobility  Visit Diagnosis: Chronic midline low back pain with left-sided sciatica  Difficulty in walking, not elsewhere classified  Muscle weakness (generalized)     Problem List Patient Active Problem List   Diagnosis Date Noted  . Vertigo 08/20/2016  . Eustachian tube dysfunction 04/17/2016  . Abdominal pain 03/02/2016  . Gastroenteritis 03/02/2016  . Rheumatoid arthritis with rheumatoid factor of multiple sites without organ or systems involvement ([place]) 01/13/2016  . HLA B27 (HLA B27 positive) 01/13/2016  . Osteoarthritis of foot 01/13/2016  . DJD (degenerative joint disease), cervical 01/13/2016  . Spondylosis of lumbar region without myelopathy or radiculopathy 01/13/2016  . Primary osteoarthritis of both hands 01/13/2016  . Non-seasonal allergic rhinitis 08/13/2014  . Stiffness of joints, not elsewhere classified, multiple sites 08/26/2013  .  Flu-like symptoms 03/02/2013  . Lactic acidosis 03/02/2013  . Sinus tachycardia 03/02/2013  . Elevated lactic acid level 03/02/2013  . Influenza due to identified novel influenza A virus with other respiratory manifestations 03/02/2013  . Hyperglycemia 03/02/2013  . Fibromyalgia   . OSA (obstructive sleep apnea) 06/22/2011  . DOE (dyspnea on exertion) 05/30/2011  . Moderate persistent asthma 03/30/2011   4:15 PM, 11/04/19 Jerene Pitch, DPT Physical Therapy with [facility]  [phone] office  [place] [address] [place], [place], [address] Phone: [phone]   Fax:  [phone]  Name: Stephanie Norris MRN: [id number] Date of Birth: 1955/06/07

## 2019-11-10 DIAGNOSIS — M0579 Rheumatoid arthritis with rheumatoid factor of multiple sites without organ or systems involvement: Secondary | ICD-10-CM | POA: Diagnosis not present

## 2019-11-10 DIAGNOSIS — E1142 Type 2 diabetes mellitus with diabetic polyneuropathy: Secondary | ICD-10-CM | POA: Diagnosis not present

## 2019-11-10 DIAGNOSIS — F331 Major depressive disorder, recurrent, moderate: Secondary | ICD-10-CM | POA: Diagnosis not present

## 2019-11-10 DIAGNOSIS — K219 Gastro-esophageal reflux disease without esophagitis: Secondary | ICD-10-CM | POA: Diagnosis not present

## 2019-11-10 DIAGNOSIS — D72829 Elevated white blood cell count, unspecified: Secondary | ICD-10-CM | POA: Diagnosis not present

## 2019-11-10 DIAGNOSIS — G9009 Other idiopathic peripheral autonomic neuropathy: Secondary | ICD-10-CM | POA: Diagnosis not present

## 2019-11-10 DIAGNOSIS — E782 Mixed hyperlipidemia: Secondary | ICD-10-CM | POA: Diagnosis not present

## 2019-11-10 DIAGNOSIS — I10 Essential (primary) hypertension: Secondary | ICD-10-CM | POA: Diagnosis not present

## 2019-11-10 DIAGNOSIS — J302 Other seasonal allergic rhinitis: Secondary | ICD-10-CM | POA: Diagnosis not present

## 2019-11-10 DIAGNOSIS — J4520 Mild intermittent asthma, uncomplicated: Secondary | ICD-10-CM | POA: Diagnosis not present

## 2019-11-11 ENCOUNTER — Telehealth (HOSPITAL_COMMUNITY): Payer: Self-pay | Admitting: Physical Therapy

## 2019-11-11 ENCOUNTER — Ambulatory Visit (HOSPITAL_COMMUNITY): Payer: PPO | Admitting: Physical Therapy

## 2019-11-11 NOTE — Telephone Encounter (Signed)
pt called to cx today's appt due to she has a bad headache

## 2019-11-13 DIAGNOSIS — J45909 Unspecified asthma, uncomplicated: Secondary | ICD-10-CM | POA: Diagnosis not present

## 2019-11-13 DIAGNOSIS — E119 Type 2 diabetes mellitus without complications: Secondary | ICD-10-CM | POA: Diagnosis not present

## 2019-11-13 DIAGNOSIS — G720 Drug-induced myopathy: Secondary | ICD-10-CM | POA: Diagnosis not present

## 2019-11-13 DIAGNOSIS — E782 Mixed hyperlipidemia: Secondary | ICD-10-CM | POA: Diagnosis not present

## 2019-11-17 DIAGNOSIS — J340 Abscess, furuncle and carbuncle of nose: Secondary | ICD-10-CM | POA: Diagnosis not present

## 2019-11-17 DIAGNOSIS — R42 Dizziness and giddiness: Secondary | ICD-10-CM | POA: Diagnosis not present

## 2019-11-18 ENCOUNTER — Other Ambulatory Visit: Payer: Self-pay

## 2019-11-18 ENCOUNTER — Encounter (HOSPITAL_COMMUNITY): Payer: Self-pay | Admitting: Physical Therapy

## 2019-11-18 ENCOUNTER — Ambulatory Visit (HOSPITAL_COMMUNITY): Payer: PPO | Attending: Student | Admitting: Physical Therapy

## 2019-11-18 DIAGNOSIS — M542 Cervicalgia: Secondary | ICD-10-CM | POA: Insufficient documentation

## 2019-11-18 DIAGNOSIS — G8929 Other chronic pain: Secondary | ICD-10-CM | POA: Diagnosis not present

## 2019-11-18 DIAGNOSIS — M6281 Muscle weakness (generalized): Secondary | ICD-10-CM

## 2019-11-18 DIAGNOSIS — M5442 Lumbago with sciatica, left side: Secondary | ICD-10-CM | POA: Insufficient documentation

## 2019-11-18 DIAGNOSIS — R262 Difficulty in walking, not elsewhere classified: Secondary | ICD-10-CM

## 2019-11-18 NOTE — Therapy (Signed)
[place], [place], [address] Phone: [phone]   Fax:  [phone]  Physical Therapy Treatment  Patient Details  Name: Stephanie Norris MRN: [id number] Date of Birth: 1955/03/17 Referring Provider (PT): Viona Gilmore   Encounter Date: 11/18/2019   PT End of Session - 11/18/19 1533    Visit Number 3    Number of Visits 12    Date for PT Re-Evaluation 12/11/19    Authorization Type visits based on med nec. $15 co pay, no auth req.    Progress Note Due on Visit 10    PT Start Time 1532    PT Stop Time 1610    PT Time Calculation (min) 38 min    Activity Tolerance Patient limited by pain    Behavior During Therapy [facility] for tasks assessed/performed           Past Medical History:  Diagnosis Date  . Asthma   . COPD (chronic obstructive pulmonary disease) ([place])   . Diabetes mellitus without complication (HCC)    Type 2  . Dizziness   . Fibromyalgia   . GERD (gastroesophageal reflux disease)   . Headache   . Hyperlipidemia   . OSA (obstructive sleep apnea)   . Osteoarthritis   . PCOS (polycystic ovarian syndrome)   . Raynaud disease   . Rheumatoid arthritis [facility])     Past Surgical History:  Procedure Laterality Date  . APPENDECTOMY    . BACK SURGERY  2021  . CHOLECYSTECTOMY    . COLONOSCOPY W/ BIOPSIES  fall 2009  . COMBINED HYSTERECTOMY VAGINAL / OOPHORECTOMY / A&P REPAIR  10/2002   endometriosis, cystocele, fibroids  . KNEE SURGERY  1996  . TOE SURGERY    . VESICOVAGINAL FISTULA CLOSURE W/ TAH      There were no vitals filed for this visit.   Subjective Assessment - 11/18/19 1537    Subjective States she is finally starting to feel better since the fall. States that she has been trying to do her exercises. States that her current pain is 1/10 in her buttocks but no surgery pain. States she would like to start working towards her neck pain and improving that.    Pertinent History DB,  fibromyalgia, RA, COPD, neck pain, bilateral neuropathy    Limitations Sitting;Lifting;Standing;Walking;House hold activities    Currently in Pain? Yes    Pain Score 1     Pain Location Buttocks    Pain Orientation Left    Pain Descriptors / Indicators Nagging    Pain Onset More than a month ago              [facility] PT Assessment - 11/18/19 0001      Assessment   Medical Diagnosis LBP and neck pain    Referring Provider (PT) Viona Gilmore      Observation/Other Assessments   Focus on Therapeutic Outcomes (FOTO)  59% function, predicted was 49% function - initial was 42% function       AROM   Lumbar Flexion 75% limited   no pain    Lumbar Extension 75% limited   pulling no pain    Lumbar - Right Side Bend 75% limited   pulling no pain    Lumbar - Left Side Bend 75% limited   pulling no pain      Strength   Right Hip Flexion 4+/5    Left Hip Flexion 4+/5    Right Knee Flexion 4/5  Right Knee Extension 4+/5    Left Knee Flexion 4-/5    Left Knee Extension 4/5    Right Ankle Dorsiflexion 5/5    Left Ankle Dorsiflexion 5/5                         [facility] Adult PT Treatment/Exercise - 11/18/19 0001      Exercises   Exercises Lumbar      Lumbar Exercises: Stretches   Single Knee to Chest Stretch 5 reps;10 seconds;Left;Right   4 sets    Lower Trunk Rotation --   3x10 B 10" holds     Lumbar Exercises: Standing   Other Standing Lumbar Exercises squats at counter 3x10     Other Standing Lumbar Exercises lateral steppingin mini squat at counter 4x5 B       Lumbar Exercises: Supine   Bridge 5 reps;5 seconds   4 sets                   PT Short Term Goals - 11/18/19 1557      PT SHORT TERM GOAL #1   Title Patient will be independent in self management strategies to improve quality of life and functional outcomes.    Time 3    Period Weeks    Status Achieved    Target Date 11/20/19      PT SHORT TERM GOAL #2   Title Patient will report at  least 25% improvement in overall symptoms and/or functional ability.    Baseline 75% better in regards to her back    Time 3    Period Weeks    Status Achieved    Target Date 11/20/19             PT Long Term Goals - 11/18/19 1558      PT LONG TERM GOAL #1   Title Patient will improve on FOTO score to meet predicted outcomes to demonstrate improved functional mobility.    Baseline was 42% function - predicted 49% function - current 59% function    Time 6    Period Weeks    Status Achieved      PT LONG TERM GOAL #2   Title Patient will be able to ascend and descend stairs without use of railing to improve ability for patient to go up and down stairs at home.    Baseline still hurts uses railing from recent fall    Time 6    Status On-going      PT LONG TERM GOAL #3   Title Patient will report at least 50% improvement in overall symptoms and/or functional ability    Baseline 75% better in her back    Time 6    Period Weeks    Status Achieved                 Plan - 11/18/19 1600    Clinical Impression Statement Added lumbar mobility exercises and this was tolerated moderately well, cued patient to stay in pain free range. Patient inquired about transitioning to PT for her neck as her back is doing very well. All short term goals and 2/3 long term goals met in regards to her low back. Patient still having difficulties with stairs but states this is most likely due to recent fall and not low back. Will assess cervical spine next session and add to POC as order is already in for neck pain from same doctor.  Personal Factors and Comorbidities Age;Comorbidity 1;Comorbidity 2;Comorbidity 3+    Comorbidities RA, fibromyalgia, DB, COPD, hx left knee surgery, bilateral neuopathy    Examination-Activity Limitations Stand;Squat;Sit;Sleep;Bathing;Bed Mobility;Bend;Stairs;Carry;Toileting;Transfers;Hygiene/Grooming;Lift;Locomotion Level    Examination-Participation Restrictions  Cleaning;Shop;Driving;Community Activity;Meal Prep    Stability/Clinical Decision Making Stable/Uncomplicated    Rehab Potential Good    PT Frequency 2x / week    PT Duration 6 weeks    PT Treatment/Interventions ADLs/Self Care Home Management;Aquatic Therapy;Biofeedback;Cryotherapy;Electrical Stimulation;Moist Heat;Traction;Balance training;Therapeutic exercise;Therapeutic activities;Functional mobility training;Stair training;Gait training;DME Instruction;Neuromuscular re-education;Cognitive remediation;Patient/family education;Manual techniques;Dry needling;Joint Manipulations;Passive range of motion;Spinal Manipulations    PT Next Visit Plan assess cervical spine and incorporate into POC - new cert sent to MD with c-spine goals.    PT Home Exercise Plan hip extension in prone; Standing 3 way hip, hip abd/add iso, hip ext iso    Consulted and Agree with Plan of Care Patient           Patient will benefit from skilled therapeutic intervention in order to improve the following deficits and impairments:  Abnormal gait, Decreased endurance, Decreased balance, Decreased mobility, Difficulty walking, Decreased cognition, Decreased range of motion, Decreased coordination, Improper body mechanics, Decreased activity tolerance, Decreased strength, Pain, Hypomobility  Visit Diagnosis: Chronic midline low back pain with left-sided sciatica  Difficulty in walking, not elsewhere classified  Muscle weakness (generalized)     Problem List Patient Active Problem List   Diagnosis Date Noted  . Vertigo 08/20/2016  . Eustachian tube dysfunction 04/17/2016  . Abdominal pain 03/02/2016  . Gastroenteritis 03/02/2016  . Rheumatoid arthritis with rheumatoid factor of multiple sites without organ or systems involvement ([place]) 01/13/2016  . HLA B27 (HLA B27 positive) 01/13/2016  . Osteoarthritis of foot 01/13/2016  . DJD (degenerative joint disease), cervical 01/13/2016  . Spondylosis of lumbar region  without myelopathy or radiculopathy 01/13/2016  . Primary osteoarthritis of both hands 01/13/2016  . Non-seasonal allergic rhinitis 08/13/2014  . Stiffness of joints, not elsewhere classified, multiple sites 08/26/2013  . Flu-like symptoms 03/02/2013  . Lactic acidosis 03/02/2013  . Sinus tachycardia 03/02/2013  . Elevated lactic acid level 03/02/2013  . Influenza due to identified novel influenza A virus with other respiratory manifestations 03/02/2013  . Hyperglycemia 03/02/2013  . Fibromyalgia   . OSA (obstructive sleep apnea) 06/22/2011  . DOE (dyspnea on exertion) 05/30/2011  . Moderate persistent asthma 03/30/2011   4:12 PM, 11/18/19 Jerene Pitch, DPT Physical Therapy with [facility]  [phone] office  [place] [address] [place], [place], [address] Phone: [phone]   Fax:  [phone]  Name: Stephanie Norris MRN: [id number] Date of Birth: 04/12/1955

## 2019-11-25 ENCOUNTER — Encounter (HOSPITAL_COMMUNITY): Payer: Self-pay | Admitting: Physical Therapy

## 2019-11-25 ENCOUNTER — Ambulatory Visit (HOSPITAL_COMMUNITY): Payer: PPO | Admitting: Physical Therapy

## 2019-11-25 ENCOUNTER — Other Ambulatory Visit: Payer: Self-pay

## 2019-11-25 DIAGNOSIS — M5442 Lumbago with sciatica, left side: Secondary | ICD-10-CM

## 2019-11-25 DIAGNOSIS — M6281 Muscle weakness (generalized): Secondary | ICD-10-CM

## 2019-11-25 DIAGNOSIS — G8929 Other chronic pain: Secondary | ICD-10-CM

## 2019-11-25 DIAGNOSIS — M542 Cervicalgia: Secondary | ICD-10-CM

## 2019-11-25 DIAGNOSIS — R262 Difficulty in walking, not elsewhere classified: Secondary | ICD-10-CM

## 2019-11-25 NOTE — Therapy (Signed)
[place], [place], [address] Phone: [phone]   Fax:  [phone]  Physical Therapy Treatment/[place]  Patient Details  Name: Stephanie Norris MRN: [id number] Date of Birth: 1955-05-24 Referring Provider (PT): Viona Gilmore   Encounter Date: 11/25/2019   PT End of Session - 11/25/19 1314    Visit Number 4    Number of Visits 12    Date for PT Re-Evaluation 12/11/19    Authorization Type visits based on med nec. $15 co pay, no auth req.    Progress Note Due on Visit 10    PT Start Time 1315    PT Stop Time 1355    PT Time Calculation (min) 40 min    Activity Tolerance Patient limited by pain    Behavior During Therapy [facility] for tasks assessed/performed           Past Medical History:  Diagnosis Date  . Asthma   . COPD (chronic obstructive pulmonary disease) ([place])   . Diabetes mellitus without complication (HCC)    Type 2  . Dizziness   . Fibromyalgia   . GERD (gastroesophageal reflux disease)   . Headache   . Hyperlipidemia   . OSA (obstructive sleep apnea)   . Osteoarthritis   . PCOS (polycystic ovarian syndrome)   . Raynaud disease   . Rheumatoid arthritis [facility])     Past Surgical History:  Procedure Laterality Date  . APPENDECTOMY    . BACK SURGERY  2021  . CHOLECYSTECTOMY    . COLONOSCOPY W/ BIOPSIES  fall 2009  . COMBINED HYSTERECTOMY VAGINAL / OOPHORECTOMY / A&P REPAIR  10/2002   endometriosis, cystocele, fibroids  . KNEE SURGERY  1996  . TOE SURGERY    . VESICOVAGINAL FISTULA CLOSURE W/ TAH      There were no vitals filed for this visit.   Subjective Assessment - 11/25/19 1315    Subjective Patient states her back is doing good. Her neck hurts with everything. She notices that she slumps a lot because her feet don't touch the floor. Her neck bothers her with driving and looking up some. She gets headaches every other week. Rubbing her neck with voltaren seems to help. She has chronic  neck pain for years and it has been getting worse at the beginning of the year. Pain is worse on the right side.    Pertinent History DB, fibromyalgia, RA, COPD, neck pain, bilateral neuropathy    Limitations Sitting;Lifting;Standing;Walking;House hold activities    Patient Stated Goals to get her neck to stop aching all the time    Currently in Pain? Yes    Pain Score 2     Pain Location Neck    Pain Descriptors / Indicators Aching    Pain Type Chronic pain    Pain Onset More than a month ago              [facility] PT Assessment - 11/25/19 0001      Assessment   Medical Diagnosis LBP and neck pain    Referring Provider (PT) Viona Gilmore    Prior Therapy yes      Precautions   Precautions None      Balance Screen   Has the patient fallen in the past 6 months Yes    How many times? 2    Has the patient had a decrease in activity level because of a fear of falling?  Yes    Is the patient  reluctant to leave their home because of a fear of falling?  No      Cognition   Overall Cognitive Status Within Functional Limits for tasks assessed      Observation/Other Assessments   Observations Ambulates without AD      Sensation   Light Touch Appears Intact      Posture/Postural Control   Posture/Postural Control Postural limitations    Postural Limitations Rounded Shoulders;Forward head      AROM   AROM Assessment Site Cervical    Cervical Flexion 0% limited pain at base of neck    Cervical Extension 0% limited    Cervical - Right Side Bend 50% limited pain in neck    Cervical - Left Side Bend 25% limited     Cervical - Right Rotation 25% limited    Cervical - Left Rotation 25% limited      Strength   Strength Assessment Site Shoulder;Elbow;Wrist;Hand    Right/Left Shoulder Right;Left    Right Shoulder ABduction 5/5    Left Shoulder ABduction 5/5    Right/Left Elbow Right;Left    Right Elbow Flexion 5/5    Right Elbow Extension 5/5    Left Elbow Flexion 5/5    Left  Elbow Extension 5/5    Right/Left Wrist Right;Left    Right Wrist Flexion 5/5    Right Wrist Extension 5/5    Left Wrist Flexion 5/5    Left Wrist Extension 5/5    Right/Left hand Right;Left    Right Hand Gross Grasp Functional    Left Hand Gross Grasp Functional      Palpation   Spinal mobility hypomobile throughout cervical spine    Palpation comment TTP bilateral cervical paraspinals, UT, levator scapulae                         [facility] Adult PT Treatment/Exercise - 11/25/19 0001      Exercises   Exercises Neck      Neck Exercises: Standing   Other Standing Exercises scapular retraction 2x 10      Neck Exercises: Seated   Other Seated Exercise Upper trap stretch 3x30 second holds bilateral       Neck Exercises: Supine   Neck Retraction 10 reps;3 secs    Neck Retraction Limitations 2 sets                   PT Education - 11/25/19 1313    Education Details Patient educated on reeval findings, HEP    Person(s) Educated Patient    Methods Explanation;Demonstration;Handout    Comprehension Verbalized understanding;Returned demonstration            PT Short Term Goals - 11/18/19 1557      PT SHORT TERM GOAL #1   Title Patient will be independent in self management strategies to improve quality of life and functional outcomes.    Time 3    Period Weeks    Status Achieved    Target Date 11/20/19      PT SHORT TERM GOAL #2   Title Patient will report at least 25% improvement in overall symptoms and/or functional ability.    Baseline 75% better in regards to her back    Time 3    Period Weeks    Status Achieved    Target Date 11/20/19             PT Long Term Goals - 11/25/19 9798  PT LONG TERM GOAL #1   Title Patient will improve on FOTO score to meet predicted outcomes to demonstrate improved functional mobility.    Baseline was 42% function - predicted 49% function - current 59% function    Time 6    Period Weeks    Status  Achieved      PT LONG TERM GOAL #2   Title Patient will be able to ascend and descend stairs without use of railing to improve ability for patient to go up and down stairs at home.    Baseline still hurts uses railing from recent fall    Time 6    Status On-going      PT LONG TERM GOAL #3   Title Patient will report at least 50% improvement in overall symptoms and/or functional ability    Baseline 75% better in her back    Time 6    Period Weeks    Status Achieved      PT LONG TERM GOAL #4   Title Patient will improve cervical ROM by at least 25% in all restricted planes for improved ability to turn head with driving.    Time 2    Period Weeks    Status New    Target Date 12/09/19      PT LONG TERM GOAL #5   Title Patient will report at least 50% improvment in neck symptoms for improved quality of life.    Time 2    Period Weeks    Status New    Target Date 12/09/19                 Plan - 11/25/19 1314    Clinical Impression Statement Patient is a  64 y.o. female presenting to physical therapy with c/o neck pain. She presents with pain limited deficits in cervical spine ROM, endurance, postural impairments, spinal mobility, hyperactive musculature, and functional mobility with ADL. She is having to modify and restrict ADL as indicated by subjective information and objective measures which is affecting overall participation. Patient tolerates new exercises without c/o increase in symptoms. Due to financial constraints patient wishes to get HEP over several weeks and discharge at end of current POC. 2 new goals added for cervical spine impairments. Patient will continue to benefit from skilled physical therapy in order to improve function and reduce impairment.    Personal Factors and Comorbidities Age;Comorbidity 1;Comorbidity 2;Comorbidity 3+    Comorbidities RA, fibromyalgia, DB, COPD, hx left knee surgery, bilateral neuopathy    Examination-Activity Limitations  Stand;Squat;Sit;Sleep;Bathing;Bed Mobility;Bend;Stairs;Carry;Toileting;Transfers;Hygiene/Grooming;Lift;Locomotion Level    Examination-Participation Restrictions Cleaning;Shop;Driving;Community Activity;Meal Prep    Stability/Clinical Decision Making Stable/Uncomplicated    Rehab Potential Good    PT Frequency 2x / week    PT Duration 6 weeks    PT Treatment/Interventions ADLs/Self Care Home Management;Aquatic Therapy;Biofeedback;Cryotherapy;Electrical Stimulation;Moist Heat;Traction;Balance training;Therapeutic exercise;Therapeutic activities;Functional mobility training;Stair training;Gait training;DME Instruction;Neuromuscular re-education;Cognitive remediation;Patient/family education;Manual techniques;Dry needling;Joint Manipulations;Passive range of motion;Spinal Manipulations    PT Next Visit Plan possibly manual for mobility pain. postural strenghtening, cervical ROM    PT Home Exercise Plan hip extension in prone; Standing 3 way hip, hip abd/add iso, hip ext iso 10/20 c/sp retraction, scap retractions, UT stretch    Consulted and Agree with Plan of Care Patient           Patient will benefit from skilled therapeutic intervention in order to improve the following deficits and impairments:  Abnormal gait, Decreased endurance, Decreased balance, Decreased mobility, Difficulty walking, Decreased cognition, Decreased  range of motion, Decreased coordination, Improper body mechanics, Decreased activity tolerance, Decreased strength, Pain, Hypomobility  Visit Diagnosis: Chronic midline low back pain with left-sided sciatica  Difficulty in walking, not elsewhere classified  Muscle weakness (generalized)  Cervicalgia     Problem List Patient Active Problem List   Diagnosis Date Noted  . Vertigo 08/20/2016  . Eustachian tube dysfunction 04/17/2016  . Abdominal pain 03/02/2016  . Gastroenteritis 03/02/2016  . Rheumatoid arthritis with rheumatoid factor of multiple sites without organ  or systems involvement ([place]) 01/13/2016  . HLA B27 (HLA B27 positive) 01/13/2016  . Osteoarthritis of foot 01/13/2016  . DJD (degenerative joint disease), cervical 01/13/2016  . Spondylosis of lumbar region without myelopathy or radiculopathy 01/13/2016  . Primary osteoarthritis of both hands 01/13/2016  . Non-seasonal allergic rhinitis 08/13/2014  . Stiffness of joints, not elsewhere classified, multiple sites 08/26/2013  . Flu-like symptoms 03/02/2013  . Lactic acidosis 03/02/2013  . Sinus tachycardia 03/02/2013  . Elevated lactic acid level 03/02/2013  . Influenza due to identified novel influenza A virus with other respiratory manifestations 03/02/2013  . Hyperglycemia 03/02/2013  . Fibromyalgia   . OSA (obstructive sleep apnea) 06/22/2011  . DOE (dyspnea on exertion) 05/30/2011  . Moderate persistent asthma 03/30/2011   4:14 PM, 11/25/19 Mearl Latin PT, DPT Physical Therapist at [place], [place], [address] Phone: [phone]   Fax:  [phone]  Name: Stephanie Norris MRN: [id number] Date of Birth: 07/21/1955

## 2019-12-02 ENCOUNTER — Encounter (HOSPITAL_COMMUNITY): Payer: Self-pay | Admitting: Physical Therapy

## 2019-12-02 ENCOUNTER — Ambulatory Visit (HOSPITAL_COMMUNITY): Payer: PPO | Admitting: Physical Therapy

## 2019-12-02 ENCOUNTER — Other Ambulatory Visit: Payer: Self-pay

## 2019-12-02 DIAGNOSIS — M542 Cervicalgia: Secondary | ICD-10-CM

## 2019-12-02 DIAGNOSIS — R262 Difficulty in walking, not elsewhere classified: Secondary | ICD-10-CM

## 2019-12-02 DIAGNOSIS — M5442 Lumbago with sciatica, left side: Secondary | ICD-10-CM

## 2019-12-02 DIAGNOSIS — M6281 Muscle weakness (generalized): Secondary | ICD-10-CM

## 2019-12-02 DIAGNOSIS — G8929 Other chronic pain: Secondary | ICD-10-CM

## 2019-12-02 NOTE — Therapy (Signed)
[place], [place], [address] Phone: [phone]   Fax:  [phone]  Physical Therapy Treatment  Patient Details  Name: Stephanie Norris MRN: [id number] Date of Birth: 1955/07/12 Referring Provider (PT): Viona Gilmore   Encounter Date: 12/02/2019   PT End of Session - 12/02/19 1316    Visit Number 5    Number of Visits 12    Date for PT Re-Evaluation 12/11/19    Authorization Type visits based on med nec. $15 co pay, no auth req.    Progress Note Due on Visit 10    PT Start Time 1316    PT Stop Time 1355    PT Time Calculation (min) 39 min    Activity Tolerance Patient limited by pain    Behavior During Therapy [facility] for tasks assessed/performed           Past Medical History:  Diagnosis Date  . Asthma   . COPD (chronic obstructive pulmonary disease) ([place])   . Diabetes mellitus without complication (HCC)    Type 2  . Dizziness   . Fibromyalgia   . GERD (gastroesophageal reflux disease)   . Headache   . Hyperlipidemia   . OSA (obstructive sleep apnea)   . Osteoarthritis   . PCOS (polycystic ovarian syndrome)   . Raynaud disease   . Rheumatoid arthritis [facility])     Past Surgical History:  Procedure Laterality Date  . APPENDECTOMY    . BACK SURGERY  2021  . CHOLECYSTECTOMY    . COLONOSCOPY W/ BIOPSIES  fall 2009  . COMBINED HYSTERECTOMY VAGINAL / OOPHORECTOMY / A&P REPAIR  10/2002   endometriosis, cystocele, fibroids  . KNEE SURGERY  1996  . TOE SURGERY    . VESICOVAGINAL FISTULA CLOSURE W/ TAH      There were no vitals filed for this visit.   Subjective Assessment - 12/02/19 1317    Subjective Patient states her right side seems to Hoe more. She has decreased neck symptoms with stretching and exercises.    Pertinent History DB, fibromyalgia, RA, COPD, neck pain, bilateral neuropathy    Limitations Sitting;Lifting;Standing;Walking;House hold activities    Patient Stated Goals to get her neck  to stop aching all the time    Currently in Pain? Yes    Pain Score 3     Pain Location Neck    Pain Onset More than a month ago                             [facility] Adult PT Treatment/Exercise - 12/02/19 0001      Neck Exercises: Theraband   Shoulder Extension 10 reps;Green    Shoulder Extension Limitations 2 sets    Rows 10 reps;Green    Rows Limitations 2 sets     Other Theraband Exercises glenohumeral ER with scap ret 2x10 green band      Neck Exercises: Standing   Other Standing Exercises cervical retraction isometric with towel at wall 2x10 5 second holds      Neck Exercises: Seated   Other Seated Exercise thoracic extension over chair 10x 5 second holds      Neck Exercises: Sidelying   Other Sidelying Exercise open book 1x10 5 second holds bilateral                  PT Education - 12/02/19 1315    Education Details HEP, exercise mechanics  Person(s) Educated Patient    Methods Explanation;Demonstration    Comprehension Returned demonstration;Verbalized understanding            PT Short Term Goals - 11/18/19 1557      PT SHORT TERM GOAL #1   Title Patient will be independent in self management strategies to improve quality of life and functional outcomes.    Time 3    Period Weeks    Status Achieved    Target Date 11/20/19      PT SHORT TERM GOAL #2   Title Patient will report at least 25% improvement in overall symptoms and/or functional ability.    Baseline 75% better in regards to her back    Time 3    Period Weeks    Status Achieved    Target Date 11/20/19             PT Long Term Goals - 11/25/19 1602      PT LONG TERM GOAL #1   Title Patient will improve on FOTO score to meet predicted outcomes to demonstrate improved functional mobility.    Baseline was 42% function - predicted 49% function - current 59% function    Time 6    Period Weeks    Status Achieved      PT LONG TERM GOAL #2   Title Patient will be  able to ascend and descend stairs without use of railing to improve ability for patient to go up and down stairs at home.    Baseline still hurts uses railing from recent fall    Time 6    Status On-going      PT LONG TERM GOAL #3   Title Patient will report at least 50% improvement in overall symptoms and/or functional ability    Baseline 75% better in her back    Time 6    Period Weeks    Status Achieved      PT LONG TERM GOAL #4   Title Patient will improve cervical ROM by at least 25% in all restricted planes for improved ability to turn head with driving.    Time 2    Period Weeks    Status New    Target Date 12/09/19      PT LONG TERM GOAL #5   Title Patient will report at least 50% improvment in neck symptoms for improved quality of life.    Time 2    Period Weeks    Status New    Target Date 12/09/19                 Plan - 12/02/19 1316    Clinical Impression Statement Patient tolerates addition of postural strengthening without c/o increased symptoms. She has greatest difficulty with shoulder extension exercise. She requires posterior wall support for posture with glenohumeral ER with scap retraction. She requires frequent verbal cueing for mechanics while standing at wall. Patient tolerates spinal mobility exercises without an increase in symptoms and minimal cueing for positioning and mechanics. Patient will continue to benefit from skilled physical therapy in order to reduce impairment and improve function.    Personal Factors and Comorbidities Age;Comorbidity 1;Comorbidity 2;Comorbidity 3+    Comorbidities RA, fibromyalgia, DB, COPD, hx left knee surgery, bilateral neuopathy    Examination-Activity Limitations Stand;Squat;Sit;Sleep;Bathing;Bed Mobility;Bend;Stairs;Carry;Toileting;Transfers;Hygiene/Grooming;Lift;Locomotion Level    Examination-Participation Restrictions Cleaning;Shop;Driving;Community Activity;Meal Prep    Stability/Clinical Decision Making  Stable/Uncomplicated    Rehab Potential Good    PT Frequency 2x / week      PT Duration 6 weeks    PT Treatment/Interventions ADLs/Self Care Home Management;Aquatic Therapy;Biofeedback;Cryotherapy;Electrical Stimulation;Moist Heat;Traction;Balance training;Therapeutic exercise;Therapeutic activities;Functional mobility training;Stair training;Gait training;DME Instruction;Neuromuscular re-education;Cognitive remediation;Patient/family education;Manual techniques;Dry needling;Joint Manipulations;Passive range of motion;Spinal Manipulations    PT Next Visit Plan possibly manual for mobility pain. postural strenghtening, cervical ROM; anticipate DC next session    PT Home Exercise Plan hip extension in prone; Standing 3 way hip, hip abd/add iso, hip ext iso 10/20 c/sp retraction, scap retractions, UT stretch 10/27 row, extension, ER with scap ret, open book, retraction iso at wall    Consulted and Agree with Plan of Care Patient           Patient will benefit from skilled therapeutic intervention in order to improve the following deficits and impairments:  Abnormal gait, Decreased endurance, Decreased balance, Decreased mobility, Difficulty walking, Decreased cognition, Decreased range of motion, Decreased coordination, Improper body mechanics, Decreased activity tolerance, Decreased strength, Pain, Hypomobility  Visit Diagnosis: Chronic midline low back pain with left-sided sciatica  Difficulty in walking, not elsewhere classified  Muscle weakness (generalized)  Cervicalgia     Problem List Patient Active Problem List   Diagnosis Date Noted  . Vertigo 08/20/2016  . Eustachian tube dysfunction 04/17/2016  . Abdominal pain 03/02/2016  . Gastroenteritis 03/02/2016  . Rheumatoid arthritis with rheumatoid factor of multiple sites without organ or systems involvement ([place]) 01/13/2016  . HLA B27 (HLA B27 positive) 01/13/2016  . Osteoarthritis of foot 01/13/2016  . DJD (degenerative joint  disease), cervical 01/13/2016  . Spondylosis of lumbar region without myelopathy or radiculopathy 01/13/2016  . Primary osteoarthritis of both hands 01/13/2016  . Non-seasonal allergic rhinitis 08/13/2014  . Stiffness of joints, not elsewhere classified, multiple sites 08/26/2013  . Flu-like symptoms 03/02/2013  . Lactic acidosis 03/02/2013  . Sinus tachycardia 03/02/2013  . Elevated lactic acid level 03/02/2013  . Influenza due to identified novel influenza A virus with other respiratory manifestations 03/02/2013  . Hyperglycemia 03/02/2013  . Fibromyalgia   . OSA (obstructive sleep apnea) 06/22/2011  . DOE (dyspnea on exertion) 05/30/2011  . Moderate persistent asthma 03/30/2011    1:57 PM, 12/02/19 Mearl Latin PT, DPT Physical Therapist at [place], [place], [address] Phone: [phone]   Fax:  [phone]  Name: Stephanie Norris MRN: [id number] Date of Birth: 1955-04-16

## 2019-12-10 ENCOUNTER — Other Ambulatory Visit: Payer: Self-pay

## 2019-12-10 ENCOUNTER — Encounter (HOSPITAL_COMMUNITY): Payer: Self-pay | Admitting: Physical Therapy

## 2019-12-10 ENCOUNTER — Ambulatory Visit (HOSPITAL_COMMUNITY): Payer: PPO | Attending: Student | Admitting: Physical Therapy

## 2019-12-10 DIAGNOSIS — M5442 Lumbago with sciatica, left side: Secondary | ICD-10-CM | POA: Insufficient documentation

## 2019-12-10 DIAGNOSIS — R262 Difficulty in walking, not elsewhere classified: Secondary | ICD-10-CM | POA: Insufficient documentation

## 2019-12-10 DIAGNOSIS — M6281 Muscle weakness (generalized): Secondary | ICD-10-CM | POA: Diagnosis not present

## 2019-12-10 DIAGNOSIS — M542 Cervicalgia: Secondary | ICD-10-CM | POA: Diagnosis not present

## 2019-12-10 DIAGNOSIS — G8929 Other chronic pain: Secondary | ICD-10-CM

## 2019-12-10 NOTE — Therapy (Signed)
[place], [place], [address] Phone: [phone]   Fax:  [phone]  Physical Therapy Treatment and Discharge note  Patient Details  Name: Stephanie Norris MRN: [id number] Date of Birth: 1955/11/16 Referring Provider (PT): Viona Gilmore  PHYSICAL THERAPY DISCHARGE SUMMARY  Visits from Start of Care: 6  Current functional level related to goals / functional outcomes: 2/2 Short term goals met, 3/5 Long term goals met   Remaining deficits: Continued pain in her neck and ROM deficits    Education / Equipment: See below Plan: Patient agrees to discharge.  Patient goals were partially met. Patient is being discharged due to the patient's request.  ?????           Encounter Date: 12/10/2019   PT End of Session - 12/10/19 1317    Visit Number 6    Number of Visits 12    Date for PT Re-Evaluation 12/11/19    Authorization Type visits based on med nec. $15 co pay, no auth req.    Progress Note Due on Visit 10    PT Start Time 1316    PT Stop Time 1355    PT Time Calculation (min) 39 min    Activity Tolerance Patient limited by pain    Behavior During Therapy [facility] for tasks assessed/performed           Past Medical History:  Diagnosis Date  . Asthma   . COPD (chronic obstructive pulmonary disease) ([place])   . Diabetes mellitus without complication (HCC)    Type 2  . Dizziness   . Fibromyalgia   . GERD (gastroesophageal reflux disease)   . Headache   . Hyperlipidemia   . OSA (obstructive sleep apnea)   . Osteoarthritis   . PCOS (polycystic ovarian syndrome)   . Raynaud disease   . Rheumatoid arthritis [facility])     Past Surgical History:  Procedure Laterality Date  . APPENDECTOMY    . BACK SURGERY  2021  . CHOLECYSTECTOMY    . COLONOSCOPY W/ BIOPSIES  fall 2009  . COMBINED HYSTERECTOMY VAGINAL / OOPHORECTOMY / A&P REPAIR  10/2002   endometriosis, cystocele, fibroids  . KNEE SURGERY  1996  .  TOE SURGERY    . VESICOVAGINAL FISTULA CLOSURE W/ TAH      There were no vitals filed for this visit.   Subjective Assessment - 12/10/19 1321    Subjective States the exercise where she holds onto chair and brings head to the side really helps. Currently she has about 2/10 pain  in her neck in the center and high up.    Pertinent History DB, fibromyalgia, RA, COPD, neck pain, bilateral neuropathy    Limitations Sitting;Lifting;Standing;Walking;House hold activities    Patient Stated Goals to get her neck to stop aching all the time    Currently in Pain? Yes    Pain Score 2     Pain Location Neck    Pain Orientation Mid    Pain Descriptors / Indicators Aching    Pain Onset More than a month ago              [facility] PT Assessment - 12/10/19 0001      Assessment   Medical Diagnosis LBP and neck pain    Referring Provider (PT) Meghan Bergman      AROM   AROM Assessment Site Cervical    Cervical Flexion 0% limited, pulling noted    Cervical Extension 0% limited  slight pain    Cervical - Right Side Bend 25% limited   pulling   Cervical - Left Side Bend 25% limited     Cervical - Right Rotation 25% limited   slight pain    Cervical - Left Rotation 25% limited   slight pain                         [facility] Adult PT Treatment/Exercise - 12/10/19 0001      Neck Exercises: Standing   Other Standing Exercises palof press 4x5 B - green theraband    Other Standing Exercises shoulder extension with towel 2x10, 5" holds B ; seated w's 5x5 B      Neck Exercises: Seated   Other Seated Exercise shoulder ER with band - green 3x5, shoulder extension 2x10 Green band, scapular retraction green band 2x10    Other Seated Exercise self mobilization to upper and lower back with tenis ball in pillow case - 6 minutes                  PT Education - 12/10/19 1327    Education Details scooting up to edge of chair prior to sitting up to reduce discomfort in back - practiced in  clinic.reviewed HEP, answered all questions    Person(s) Educated Patient    Methods Explanation    Comprehension Verbalized understanding            PT Short Term Goals - 11/18/19 1557      PT SHORT TERM GOAL #1   Title Patient will be independent in self management strategies to improve quality of life and functional outcomes.    Time 3    Period Weeks    Status Achieved    Target Date 11/20/19      PT SHORT TERM GOAL #2   Title Patient will report at least 25% improvement in overall symptoms and/or functional ability.    Baseline 75% better in regards to her back    Time 3    Period Weeks    Status Achieved    Target Date 11/20/19             PT Long Term Goals - 12/10/19 1323      PT LONG TERM GOAL #1   Title Patient will improve on FOTO score to meet predicted outcomes to demonstrate improved functional mobility.    Baseline was 42% function - predicted 49% function - current 59% function    Time 6    Period Weeks    Status Achieved      PT LONG TERM GOAL #2   Title Patient will be able to ascend and descend stairs without use of railing to improve ability for patient to go up and down stairs at home.    Baseline able to do with arm up against railing but not holding onto it    Time 6    Status Achieved      PT LONG TERM GOAL #3   Title Patient will report at least 50% improvement in overall symptoms and/or functional ability    Baseline 75% better in her back    Time 6    Period Weeks    Status Achieved      PT LONG TERM GOAL #4   Title Patient will improve cervical ROM by at least 25% in all restricted planes for improved ability to turn head with driving.    Time 2  Period Weeks    Status On-going      PT LONG TERM GOAL #5   Title Patient will report at least 50% improvment in neck symptoms for improved quality of life.    Baseline 15% better    Time 2    Period Weeks    Status On-going                 Plan - 12/10/19 1401     Clinical Impression Statement Patient present for a reassessment on this date. She is making good progress but continues to have pain and ROM deficits in her cervical spine. Patient request to be discharged at this time secondary to satisfaction with current functional status and financial limitations. All short term goals met and all low back goals met at this time, no longer term cervical goals but patient is progressing towards her goals. and feels confident in home program at this time. Answered all questions and reviewed current home program. Added new exercises to home program and made sure patient able to perform with good form prior to end of session. Patient to discharge at this time secondary to progress made.    Personal Factors and Comorbidities Age;Comorbidity 1;Comorbidity 2;Comorbidity 3+    Comorbidities RA, fibromyalgia, DB, COPD, hx left knee surgery, bilateral neuopathy    Examination-Activity Limitations Stand;Squat;Sit;Sleep;Bathing;Bed Mobility;Bend;Stairs;Carry;Toileting;Transfers;Hygiene/Grooming;Lift;Locomotion Level    Examination-Participation Restrictions Cleaning;Shop;Driving;Community Activity;Meal Prep    Stability/Clinical Decision Making Stable/Uncomplicated    Rehab Potential Good    PT Frequency 2x / week    PT Duration 6 weeks    PT Treatment/Interventions ADLs/Self Care Home Management;Aquatic Therapy;Biofeedback;Cryotherapy;Electrical Stimulation;Moist Heat;Traction;Balance training;Therapeutic exercise;Therapeutic activities;Functional mobility training;Stair training;Gait training;DME Instruction;Neuromuscular re-education;Cognitive remediation;Patient/family education;Manual techniques;Dry needling;Joint Manipulations;Passive range of motion;Spinal Manipulations    PT Next Visit Plan DC to HEP    PT Home Exercise Plan hip extension in prone; Standing 3 way hip, hip abd/add iso, hip ext iso 10/20 c/sp retraction, scap retractions, UT stretch 10/27 row, extension, ER  with scap ret, open book, retraction iso at wall    Consulted and Agree with Plan of Care Patient           Patient will benefit from skilled therapeutic intervention in order to improve the following deficits and impairments:  Abnormal gait, Decreased endurance, Decreased balance, Decreased mobility, Difficulty walking, Decreased cognition, Decreased range of motion, Decreased coordination, Improper body mechanics, Decreased activity tolerance, Decreased strength, Pain, Hypomobility  Visit Diagnosis: Chronic midline low back pain with left-sided sciatica  Difficulty in walking, not elsewhere classified  Muscle weakness (generalized)  Cervicalgia     Problem List Patient Active Problem List   Diagnosis Date Noted  . Vertigo 08/20/2016  . Eustachian tube dysfunction 04/17/2016  . Abdominal pain 03/02/2016  . Gastroenteritis 03/02/2016  . Rheumatoid arthritis with rheumatoid factor of multiple sites without organ or systems involvement ([place]) 01/13/2016  . HLA B27 (HLA B27 positive) 01/13/2016  . Osteoarthritis of foot 01/13/2016  . DJD (degenerative joint disease), cervical 01/13/2016  . Spondylosis of lumbar region without myelopathy or radiculopathy 01/13/2016  . Primary osteoarthritis of both hands 01/13/2016  . Non-seasonal allergic rhinitis 08/13/2014  . Stiffness of joints, not elsewhere classified, multiple sites 08/26/2013  . Flu-like symptoms 03/02/2013  . Lactic acidosis 03/02/2013  . Sinus tachycardia 03/02/2013  . Elevated lactic acid level 03/02/2013  . Influenza due to identified novel influenza A virus with other respiratory manifestations 03/02/2013  . Hyperglycemia 03/02/2013  . Fibromyalgia   . OSA (obstructive sleep  apnea) 06/22/2011  . DOE (dyspnea on exertion) 05/30/2011  . Moderate persistent asthma 03/30/2011  2:01 PM, 12/10/19 Jerene Pitch, DPT Physical Therapy with [facility]  [phone] office  [facility] [address] [place], [place], [address] Phone: [phone]   Fax:  [phone]  Name: SAMYUKTA CURA MRN: [id number] Date of Birth: 1955-10-20

## 2019-12-15 ENCOUNTER — Other Ambulatory Visit: Payer: Self-pay | Admitting: Physician Assistant

## 2019-12-16 NOTE — Telephone Encounter (Signed)
Attempted to contact the patient and left message for patient to advise she is due to update labs.

## 2019-12-17 ENCOUNTER — Telehealth: Payer: Self-pay

## 2019-12-17 ENCOUNTER — Other Ambulatory Visit: Payer: Self-pay | Admitting: *Deleted

## 2019-12-17 DIAGNOSIS — E782 Mixed hyperlipidemia: Secondary | ICD-10-CM | POA: Diagnosis not present

## 2019-12-17 DIAGNOSIS — E119 Type 2 diabetes mellitus without complications: Secondary | ICD-10-CM | POA: Diagnosis not present

## 2019-12-17 DIAGNOSIS — Z79899 Other long term (current) drug therapy: Secondary | ICD-10-CM

## 2019-12-17 DIAGNOSIS — J45909 Unspecified asthma, uncomplicated: Secondary | ICD-10-CM | POA: Diagnosis not present

## 2019-12-17 DIAGNOSIS — G720 Drug-induced myopathy: Secondary | ICD-10-CM | POA: Diagnosis not present

## 2019-12-17 DIAGNOSIS — I10 Essential (primary) hypertension: Secondary | ICD-10-CM | POA: Diagnosis not present

## 2019-12-17 NOTE — Telephone Encounter (Signed)
Patient left a voicemail requesting labwork orders be sent to [facility] in [place].

## 2019-12-25 ENCOUNTER — Ambulatory Visit: Payer: PPO | Admitting: Allergy & Immunology

## 2019-12-25 NOTE — Progress Notes (Signed)
Office Visit Note  Patient: Stephanie Norris             Date of Birth: 02/17/1955           MRN: [id number]             PCP: Celene Squibb, MD Referring: Celene Squibb, MD Visit Date: 01/08/2020 Occupation: @GUAROCC @  Subjective:  Generalized pain   History of Present Illness: Stephanie Norris is a 64 y.o. female with history of rheumatoid arthritis, osteoarthritis, and fibromyalgia.  She is taking methotrexate 6 tablets by mouth once weekly and folic acid 2 mg by mouth daily. She denies any recent rheumatoid arthritis flares. She continues to have chronic neck and lower back pain. She states that overall her low back pain her left lower extremity pain and weakness have improved significantly since having surgery earlier this year. She states that her fibromyalgia has been flaring more frequently which she attributes to recent weather changes as well as stress at home. She states that she is very about once a week. She states she is also noticed increased fatigue as well as worsening anxiety and depression. She denies suicidal ideation. She continues to take Cymbalta 60 mg 1 capsule by mouth daily. Patient reports that for the past 1 month she has not been going to water therapy which she feels is exacerbated her generalized pain.  She continues to have interrupted sleep at night.     Activities of Daily Living:  Patient reports morning stiffness for 5  minutes.   Patient Reports nocturnal pain.  Difficulty dressing/grooming: Denies Difficulty climbing stairs: Reports Difficulty getting out of chair: Denies Difficulty using hands for taps, buttons, cutlery, and/or writing: Reports  Review of Systems  Constitutional: Positive for fatigue.  HENT: Positive for mouth dryness and nose dryness. Negative for mouth sores.   Eyes: Negative for pain, visual disturbance and dryness.  Respiratory: Negative for cough, hemoptysis, shortness of breath and difficulty breathing.   Cardiovascular: Negative  for chest pain, palpitations, hypertension and swelling in legs/feet.  Gastrointestinal: Negative for blood in stool, constipation and diarrhea.  Endocrine: Negative for increased urination.  Genitourinary: Negative for painful urination.  Musculoskeletal: Positive for arthralgias, joint pain, joint swelling, myalgias, muscle weakness, morning stiffness, muscle tenderness and myalgias.  Skin: Negative for color change, pallor, rash, hair loss, nodules/bumps, skin tightness, ulcers and sensitivity to sunlight.  Allergic/Immunologic: Negative for susceptible to infections.  Neurological: Positive for numbness and weakness. Negative for dizziness and headaches.  Hematological: Negative for swollen glands.  Psychiatric/Behavioral: Positive for depressed mood and sleep disturbance. Negative for suicidal ideas. The patient is nervous/anxious.     PMFS History:  Patient Active Problem List   Diagnosis Date Noted  . Vertigo 08/20/2016  . Eustachian tube dysfunction 04/17/2016  . Abdominal pain 03/02/2016  . Gastroenteritis 03/02/2016  . Rheumatoid arthritis with rheumatoid factor of multiple sites without organ or systems involvement ([place]) 01/13/2016  . HLA B27 (HLA B27 positive) 01/13/2016  . Osteoarthritis of foot 01/13/2016  . DJD (degenerative joint disease), cervical 01/13/2016  . Spondylosis of lumbar region without myelopathy or radiculopathy 01/13/2016  . Primary osteoarthritis of both hands 01/13/2016  . Non-seasonal allergic rhinitis 08/13/2014  . Stiffness of joints, not elsewhere classified, multiple sites 08/26/2013  . Flu-like symptoms 03/02/2013  . Lactic acidosis 03/02/2013  . Sinus tachycardia 03/02/2013  . Elevated lactic acid level 03/02/2013  . Influenza due to identified novel influenza A virus with other respiratory manifestations 03/02/2013  .  Hyperglycemia 03/02/2013  . Fibromyalgia   . OSA (obstructive sleep apnea) 06/22/2011  . DOE (dyspnea on exertion) 05/30/2011   . Moderate persistent asthma 03/30/2011    Past Medical History:  Diagnosis Date  . Asthma   . COPD (chronic obstructive pulmonary disease) ([place])   . Diabetes mellitus without complication ([facility])    Type 2  . Dizziness   . Fibromyalgia   . GERD (gastroesophageal reflux disease)   . Headache   . Hyperlipidemia   . OSA (obstructive sleep apnea)   . Osteoarthritis   . PCOS (polycystic ovarian syndrome)   . Raynaud disease   . Rheumatoid arthritis ([place])     Family History  Problem Relation Age of Onset  . Asthma Mother   . Rheum arthritis Mother   . Allergic rhinitis Mother   . Rheum arthritis Sister   . Allergic rhinitis Sister   . Heart failure Father   . Heart disease Father   . Rheum arthritis Sister   . Allergic rhinitis Sister   . Allergic rhinitis Brother   . Allergies Other        "Everyone"  . Heart disease Maternal Grandfather   . Heart disease Maternal Grandmother   . Heart disease Paternal Grandfather   . Heart disease Paternal Grandmother    Past Surgical History:  Procedure Laterality Date  . APPENDECTOMY    . BACK SURGERY  2021  . CHOLECYSTECTOMY    . COLONOSCOPY W/ BIOPSIES  fall 2009  . COMBINED HYSTERECTOMY VAGINAL / OOPHORECTOMY / A&P REPAIR  10/2002   endometriosis, cystocele, fibroids  . KNEE SURGERY  1996  . TOE SURGERY    . VESICOVAGINAL FISTULA CLOSURE W/ TAH     Social History   Social History Narrative   Lives with husband   Retired    no children   Ass[employer] degree   16 oz caffeine daily   Immunization History  Administered Date(s) Administered  . Influenza Whole 10/07/2010, 11/06/2011  . Influenza,inj,Quad PF,6+ Mos 11/05/2012, 11/02/2015, 11/22/2016, 11/15/2017  . Influenza-Unspecified 11/05/2013  . Moderna SARS-COVID-2 Vaccination 04/13/2019, 05/11/2019  . Pneumococcal Conjugate-13 11/06/2014  . Pneumococcal Polysaccharide-23 11/02/2015     Objective: Vital Signs: BP 137/84 (BP Location: Left Arm, Patient Position: Sitting,  Cuff Size: Normal)   Pulse 89   Resp 14   Ht $R'4\' 11"'AC$  (1.499 m)   Wt 128 lb 6.4 oz (58.2 kg)   BMI 25.93 kg/m    Physical Exam Vitals and nursing note reviewed.  Constitutional:      Appearance: She is well-developed.  HENT:     Head: Normocephalic and atraumatic.  Eyes:     Conjunctiva/sclera: Conjunctivae normal.  Pulmonary:     Effort: Pulmonary effort is normal.  Abdominal:     Palpations: Abdomen is soft.  Musculoskeletal:     Cervical back: Normal range of motion.  Skin:    General: Skin is warm and dry.     Capillary Refill: Capillary refill takes less than 2 seconds.  Neurological:     Mental Status: She is alert and oriented to person, place, and time.  Psychiatric:        Behavior: Behavior normal.      Musculoskeletal Exam: Generalized hyperalgesia and positive tender points on exam. C-spine is limited range of motion with lateral rotation. Trapezius muscle tension and muscle tenderness bilaterally. Shoulder joints, elbow joints, wrist joints, MCPs, PIPs, DIPs have good range of motion with no synovitis. She has PIP and DIP thickening  consistent with osteoarthritis of both hands. Tenderness of bilateral first DIP joints noted. Hip joints have good range of motion with no discomfort. Tenderness over bilateral trochanteric bursa. Knee joints have good range of motion with no warmth or effusion. Bilateral knee crepitus noted. Ankle joints have good range of motion with no tenderness. Mild pitting edema noted.  CDAI Exam: CDAI Score: 0.4  Patient Global: 2 mm; Provider Global: 2 mm Swollen: 0 ; Tender: 0  Joint Exam 01/08/2020   No joint exam has been documented for this visit   There is currently no information documented on the homunculus. Go to the Rheumatology activity and complete the homunculus joint exam.  Investigation: No additional findings.  Imaging: No results found.  Recent Labs: Lab Results  Component Value Date   WBC 11.5 (H) 08/07/2019   HGB  13.8 08/07/2019   PLT 423 (H) 08/07/2019   NA 139 08/07/2019   K 4.2 08/07/2019   CL 106 08/07/2019   CO2 24 08/07/2019   GLUCOSE 135 (H) 08/07/2019   BUN 34 (H) 08/07/2019   CREATININE 0.68 08/07/2019   BILITOT 0.2 08/07/2019   ALKPHOS 54 09/10/2016   AST 16 08/07/2019   ALT 25 08/07/2019   PROT 6.9 08/07/2019   ALBUMIN 4.0 09/10/2016   CALCIUM 10.2 08/07/2019   GFRAA 106 08/07/2019    Speciality Comments: No specialty comments available.  Procedures:  Trigger Point Inj  Date/Time: 01/08/2020 11:34 AM Performed by: Ofilia Neas, PA-C Authorized by: Ofilia Neas, PA-C   Consent Given by:  Patient Site marked: the procedure site was marked   Timeout: prior to procedure the correct patient, procedure, and site was verified   Indications:  Pain Total # of Trigger Points:  2 Location: neck   Needle Size:  27 G Approach:  Dorsal Medications #1:  0.5 mL lidocaine 1 %; 10 mg triamcinolone acetonide 40 MG/ML Medications #2:  0.5 mL lidocaine 1 %; 10 mg triamcinolone acetonide 40 MG/ML Patient tolerance:  Patient tolerated the procedure well with no immediate complications   Allergies: Nsaids, Bactrim [sulfamethoxazole-trimethoprim], Cephalexin, Levofloxacin, and Macrobid [nitrofurantoin]     Assessment / Plan:     Visit Diagnoses: Rheumatoid arthritis with rheumatoid factor of multiple sites without organ or systems involvement ([place]) - +CCP: She has no synovitis on exam.  She has not had any recent rheumatoid arthritis flares.  She is clinically doing well on Methotrexate 6 tablets by mouth once weekly and folic acid 2 mg po daily.  She is tolerating MTX without any side effects.  She experiences occasional discomfort and stiffness in both hands due to underlying osteoarthritis.  She has not had any joint swelling recently.  She will continue taking methotrexate 6 tablets by mouth once weekly and folic acid 2 mg by mouth daily.  A refill methotrexate will be sent to the  pharmacy.  She was advised to notify us if she develops increased joint pain or joint swelling.  High risk medication use - Methotrexate 6 tablets by mouth every 7 days and folic acid 1 mg 2 tablets daily.  CBC and CMP were drawn on 08/07/2019.  She is overdue to update lab work.  Orders for CBC and CMP were released.  Her next labs will be due in March and every 3 months to monitor for drug toxicity.  Standing orders for CBC and CMP remain in place.   - Plan: CBC with Differential/Platelet, COMPLETE METABOLIC PANEL WITH GFR She has not had any  recent infections.  She has received both Moderna vaccinations.   HLA B27 (HLA B27 positive)  Primary osteoarthritis of both hands: She has PIP and DIP thickening consistent with osteoarthritis of both hands.  Bilateral 1st DIP significant prominence and tenderness noted.  Joint protection and muscle strengthening were discussed.  Primary osteoarthritis of both feet: She is not experiencing any discomfort in her feet at this time.  She wears proper fitting shoes.  Fibromyalgia: Generalized hyperalgesia and positive tender points.  She has been experiencing frequent and severe fibromyalgia flares on a weekly basis.  She has generalized myalgias and muscle tenderness and experiences muscle spasms intermittently.  She presents today with trapezius muscle tension and muscle tenderness.  Trigger point injections were performed.  She has ongoing discomfort due to trochanter bursitis of both hips and was given a handout of exercises to perform.  She has had difficulty sleeping at night due to the severity of pain.  She has tried taking melatonin at bedtime.  We discussed the importance of regular exercise and good sleep hygiene.  She has been experiencing increased anxiety and depression despite taking Cymbalta 60 mg 1 capsule by mouth daily.  She was advised to schedule an appointment with her PCP to further discuss.  We also discussed trying counseling, deep breathing  exercises, and meditation.  She plans on going back to water therapy which has been beneficial in the past.  Trapezius muscle spasm: She has bilateral trapezius muscle tension and tenderness bilaterally.  She requested trigger point injections today. She tolerated the procedure well.  Aftercare was discussed. - Plan: Trigger Point Inj  Trochanteric bursitis of both hips: She has tenderness to palpation over bilateral trochanteric bursa.  She was encouraged to perform stretching exercises on a daily basis.  Several of these exercises were demonstrated today in the office.  Handout of exercises was provided.  DDD (degenerative disc disease), cervical: She has limited ROM with lateral rotation.  She has trapezius muscle tension and muscle tenderness bilaterally.  She has been experiencing muscle spasms intermittently.  She requested trigger point injections today.  She tolerated procedure well.  We discussed trying heat as well as massage for symptomatic relief.   DDD (degenerative disc disease), lumbar: Discectomy performed April 2021 by Dr. Arnoldo Morale.  Her lower back pain has improved significantly since having surgery.  She has continued to have some lower extremity weakness on the left side.  Chronic SI joint pain: Left-  A cortisone injection was performed on 08/07/19.   Other fatigue: Chronic fatigue secondary to insomnia. Discussed the importance of regular exercise and good sleep hygiene.  She is planning on restarting water therapy.  Other medical conditions are listed as follows:  History of asthma  History of depression  History of diabetes mellitus  History of sleep apnea  Orders: Orders Placed This Encounter  Procedures  . Trigger Point Inj  . CBC with Differential/Platelet  . COMPLETE METABOLIC PANEL WITH GFR   No orders of the defined types were placed in this encounter.   Follow-Up Instructions: Return in about 5 months (around 06/07/2020) for Rheumatoid arthritis,  Fibromyalgia, Osteoarthritis.   Ofilia Neas, PA-C  Note - This record has been created using Dragon software.  Chart creation errors have been sought, but may not always  have been located. Such creation errors do not reflect on  the standard of medical care.

## 2019-12-28 ENCOUNTER — Institutional Professional Consult (permissible substitution): Payer: PPO | Admitting: Diagnostic Neuroimaging

## 2020-01-08 ENCOUNTER — Ambulatory Visit: Payer: PPO | Admitting: Physician Assistant

## 2020-01-08 ENCOUNTER — Other Ambulatory Visit: Payer: Self-pay

## 2020-01-08 ENCOUNTER — Encounter: Payer: Self-pay | Admitting: Physician Assistant

## 2020-01-08 VITALS — BP 137/84 | HR 89 | Resp 14 | Ht 59.0 in | Wt 128.4 lb

## 2020-01-08 DIAGNOSIS — M19071 Primary osteoarthritis, right ankle and foot: Secondary | ICD-10-CM

## 2020-01-08 DIAGNOSIS — M5136 Other intervertebral disc degeneration, lumbar region: Secondary | ICD-10-CM | POA: Diagnosis not present

## 2020-01-08 DIAGNOSIS — M7062 Trochanteric bursitis, left hip: Secondary | ICD-10-CM

## 2020-01-08 DIAGNOSIS — M533 Sacrococcygeal disorders, not elsewhere classified: Secondary | ICD-10-CM | POA: Diagnosis not present

## 2020-01-08 DIAGNOSIS — R5383 Other fatigue: Secondary | ICD-10-CM | POA: Diagnosis not present

## 2020-01-08 DIAGNOSIS — Z8639 Personal history of other endocrine, nutritional and metabolic disease: Secondary | ICD-10-CM

## 2020-01-08 DIAGNOSIS — M7061 Trochanteric bursitis, right hip: Secondary | ICD-10-CM

## 2020-01-08 DIAGNOSIS — M797 Fibromyalgia: Secondary | ICD-10-CM | POA: Diagnosis not present

## 2020-01-08 DIAGNOSIS — Z79899 Other long term (current) drug therapy: Secondary | ICD-10-CM | POA: Diagnosis not present

## 2020-01-08 DIAGNOSIS — Z8669 Personal history of other diseases of the nervous system and sense organs: Secondary | ICD-10-CM

## 2020-01-08 DIAGNOSIS — Z1589 Genetic susceptibility to other disease: Secondary | ICD-10-CM

## 2020-01-08 DIAGNOSIS — M62838 Other muscle spasm: Secondary | ICD-10-CM

## 2020-01-08 DIAGNOSIS — M5030 Other cervical disc degeneration, unspecified cervical region: Secondary | ICD-10-CM | POA: Diagnosis not present

## 2020-01-08 DIAGNOSIS — G8929 Other chronic pain: Secondary | ICD-10-CM

## 2020-01-08 DIAGNOSIS — Z8659 Personal history of other mental and behavioral disorders: Secondary | ICD-10-CM

## 2020-01-08 DIAGNOSIS — M19041 Primary osteoarthritis, right hand: Secondary | ICD-10-CM | POA: Diagnosis not present

## 2020-01-08 DIAGNOSIS — M19072 Primary osteoarthritis, left ankle and foot: Secondary | ICD-10-CM

## 2020-01-08 DIAGNOSIS — M0579 Rheumatoid arthritis with rheumatoid factor of multiple sites without organ or systems involvement: Secondary | ICD-10-CM

## 2020-01-08 DIAGNOSIS — M19042 Primary osteoarthritis, left hand: Secondary | ICD-10-CM

## 2020-01-08 DIAGNOSIS — Z8709 Personal history of other diseases of the respiratory system: Secondary | ICD-10-CM

## 2020-01-08 LAB — CBC WITH DIFFERENTIAL/PLATELET
Absolute Monocytes: 547 cells/uL (ref 200–950)
Basophils Absolute: 38 cells/uL (ref 0–200)
Basophils Relative: 0.4 %
Eosinophils Absolute: 250 cells/uL (ref 15–500)
Eosinophils Relative: 2.6 %
HCT: 36 % (ref 35.0–45.0)
Hemoglobin: 12 g/dL (ref 11.7–15.5)
Lymphs Abs: 2486 cells/uL (ref 850–3900)
MCH: 30.3 pg (ref 27.0–33.0)
MCHC: 33.3 g/dL (ref 32.0–36.0)
MCV: 90.9 fL (ref 80.0–100.0)
MPV: 11.7 fL (ref 7.5–12.5)
Monocytes Relative: 5.7 %
Neutro Abs: 6278 cells/uL (ref 1500–7800)
Neutrophils Relative %: 65.4 %
Platelets: 322 10*3/uL (ref 140–400)
RBC: 3.96 10*6/uL (ref 3.80–5.10)
RDW: 13.2 % (ref 11.0–15.0)
Total Lymphocyte: 25.9 %
WBC: 9.6 10*3/uL (ref 3.8–10.8)

## 2020-01-08 LAB — COMPLETE METABOLIC PANEL WITH GFR
AG Ratio: 1.8 (calc) (ref 1.0–2.5)
ALT: 17 U/L (ref 6–29)
AST: 12 U/L (ref 10–35)
Albumin: 3.9 g/dL (ref 3.6–5.1)
Alkaline phosphatase (APISO): 65 U/L (ref 37–153)
BUN: 23 mg/dL (ref 7–25)
CO2: 22 mmol/L (ref 20–32)
Calcium: 9.6 mg/dL (ref 8.6–10.4)
Chloride: 105 mmol/L (ref 98–110)
Creat: 0.66 mg/dL (ref 0.50–0.99)
GFR, Est African American: 107 mL/min/{1.73_m2} (ref >=60)
GFR, Est Non African American: 92 mL/min/{1.73_m2} (ref >=60)
Globulin: 2.2 g/dL (calc) (ref 1.9–3.7)
Glucose, Bld: 199 mg/dL — ABNORMAL HIGH (ref 65–139)
Potassium: 4.5 mmol/L (ref 3.5–5.3)
Sodium: 136 mmol/L (ref 135–146)
Total Bilirubin: 0.2 mg/dL (ref 0.2–1.2)
Total Protein: 6.1 g/dL (ref 6.1–8.1)

## 2020-01-08 MED ORDER — LIDOCAINE HCL 1 % IJ SOLN
0.5000 mL | INTRAMUSCULAR | Status: AC | PRN
  Administered 2020-01-08: .5 mL

## 2020-01-08 MED ORDER — METHOTREXATE 2.5 MG PO TABS: ORAL_TABLET | ORAL | 0 refills | Status: DC

## 2020-01-08 MED ORDER — TRIAMCINOLONE ACETONIDE 40 MG/ML IJ SUSP
10.0000 mg | INTRAMUSCULAR | Status: AC | PRN
  Administered 2020-01-08: 10 mg via INTRAMUSCULAR

## 2020-01-08 NOTE — Patient Instructions (Addendum)
Standing Labs We placed an order today for your standing lab work.   Please have your standing labs drawn in March and every 3 months   If possible, please have your labs drawn 2 weeks prior to your appointment so that the provider can discuss your results at your appointment.  We have open lab daily Monday through Thursday from 8:30-12:30 PM and 1:30-4:30 PM and Friday from 8:30-12:30 PM and 1:30-4:00 PM at the office of Dr. Bo Merino, [facility].   Please be advised, patients with office appointments requiring lab work will take precedents over walk-in lab work.  If possible, please come for your lab work on Monday and Friday afternoons, as you may experience shorter wait times. The office is located at [address], [place], [place], [place] [address] No appointment is necessary.   Labs are drawn by [facility]. Please bring your co-pay at the time of your lab draw.  You may receive a bill from [facility] for your lab work.  If you wish to have your labs drawn at another location, please call the office 24 hours in advance to send orders.  If you have any questions regarding directions or hours of operation,  please call [phone].   As a reminder, please drink plenty of water prior to coming for your lab work. Thanks!   Neck Exercises Ask your health care provider which exercises are safe for you. Do exercises exactly as told by your health care provider and adjust them as directed. It is normal to feel mild stretching, pulling, tightness, or discomfort as you do these exercises. Stop right away if you feel sudden pain or your pain gets worse. Do not begin these exercises until told by your health care provider. Neck exercises can be important for many reasons. They can improve strength and maintain flexibility in your neck, which will help your upper back and prevent neck pain. Stretching exercises Rotation neck stretching  1. Sit in a chair or stand up. 2. Place  your feet flat on the floor, shoulder width apart. 3. Slowly turn your head (rotate) to the right until a slight stretch is felt. Turn it all the way to the right so you can look over your right shoulder. Do not tilt or tip your head. 4. Hold this position for 10-30 seconds. 5. Slowly turn your head (rotate) to the left until a slight stretch is felt. Turn it all the way to the left so you can look over your left shoulder. Do not tilt or tip your head. 6. Hold this position for 10-30 seconds. Repeat __________ times. Complete this exercise __________ times a day. Neck retraction 1. Sit in a sturdy chair or stand up. 2. Look straight ahead. Do not bend your neck. 3. Use your fingers to push your chin backward (retraction). Do not bend your neck for this movement. Continue to face straight ahead. If you are doing the exercise properly, you will feel a slight sensation in your throat and a stretch at the back of your neck. 4. Hold the stretch for 1-2 seconds. Repeat __________ times. Complete this exercise __________ times a day. Strengthening exercises Neck press 1. Lie on your back on a firm bed or on the floor with a pillow under your head. 2. Use your neck muscles to push your head down on the pillow and straighten your spine. 3. Hold the position as well as you can. Keep your head facing up (in a neutral position) and your chin  tucked. 4. Slowly count to 5 while holding this position. Repeat __________ times. Complete this exercise __________ times a day. Isometrics These are exercises in which you strengthen the muscles in your neck while keeping your neck still (isometrics). 1. Sit in a supportive chair and place your hand on your forehead. 2. Keep your head and face facing straight ahead. Do not flex or extend your neck while doing isometrics. 3. Push forward with your head and neck while pushing back with your hand. Hold for 10 seconds. 4. Do the sequence again, this time putting your  hand against the back of your head. Use your head and neck to push backward against the hand pressure. 5. Finally, do the same exercise on either side of your head, pushing sideways against the pressure of your hand. Repeat __________ times. Complete this exercise __________ times a day. Prone head lifts 1. Lie face-down (prone position), resting on your elbows so that your chest and upper back are raised. 2. Start with your head facing downward, near your chest. Position your chin either on or near your chest. 3. Slowly lift your head upward. Lift until you are looking straight ahead. Then continue lifting your head as far back as you can comfortably stretch. 4. Hold your head up for 5 seconds. Then slowly lower it to your starting position. Repeat __________ times. Complete this exercise __________ times a day. Supine head lifts 1. Lie on your back (supine position), bending your knees to point to the ceiling and keeping your feet flat on the floor. 2. Lift your head slowly off the floor, raising your chin toward your chest. 3. Hold for 5 seconds. Repeat __________ times. Complete this exercise __________ times a day. Scapular retraction 1. Stand with your arms at your sides. Look straight ahead. 2. Slowly pull both shoulders (scapulae) backward and downward (retraction) until you feel a stretch between your shoulder blades in your upper back. 3. Hold for 10-30 seconds. 4. Relax and repeat. Repeat __________ times. Complete this exercise __________ times a day. Contact a health care provider if:  Your neck pain or discomfort gets much worse when you do an exercise.  Your neck pain or discomfort does not improve within 2 hours after you exercise. If you have any of these problems, stop exercising right away. Do not do the exercises again unless your health care provider says that you can. Get help right away if:  You develop sudden, severe neck pain. If this happens, stop exercising  right away. Do not do the exercises again unless your health care provider says that you can. This information is not intended to replace advice given to you by your health care provider. Make sure you discuss any questions you have with your health care provider. Document Revised: 11/20/2017 Document Reviewed: 11/20/2017 Elsevier Patient Education  2020 [employer].  Hip Bursitis Rehab Ask your health care provider which exercises are safe for you. Do exercises exactly as told by your health care provider and adjust them as directed. It is normal to feel mild stretching, pulling, tightness, or discomfort as you do these exercises. Stop right away if you feel sudden pain or your pain gets worse. Do not begin these exercises until told by your health care provider. Stretching exercise This exercise warms up your muscles and joints and improves the movement and flexibility of your hip. This exercise also helps to relieve pain and stiffness. Iliotibial band stretch An iliotibial band is a strong band of muscle tissue  that runs from the outer side of your hip to the outer side of your thigh and knee. 1. Lie on your side with your left / right leg in the top position. 2. Bend your left / right knee and grab your ankle. Stretch out your bottom arm to help you balance. 3. Slowly bring your knee back so your thigh is behind your body. 4. Slowly lower your knee toward the floor until you feel a gentle stretch on the outside of your left / right thigh. If you do not feel a stretch and your knee will not fall farther, place the heel of your other foot on top of your knee and pull your knee down toward the floor with your foot. 5. Hold this position for __________ seconds. 6. Slowly return to the starting position. Repeat __________ times. Complete this exercise __________ times a day. Strengthening exercises These exercises build strength and endurance in your hip and pelvis. Endurance is the ability to  use your muscles for a long time, even after they get tired. Bridge This exercise strengthens the muscles that move your thigh backward (hip extensors). 1. Lie on your back on a firm surface with your knees bent and your feet flat on the floor. 2. Tighten your buttocks muscles and lift your buttocks off the floor until your trunk is level with your thighs. ? Do not arch your back. ? You should feel the muscles working in your buttocks and the back of your thighs. If you do not feel these muscles, slide your feet 1-2 inches (2.5-5 cm) farther away from your buttocks. ? If this exercise is too easy, try doing it with your arms crossed over your chest. 3. Hold this position for __________ seconds. 4. Slowly lower your hips to the starting position. 5. Let your muscles relax completely after each repetition. Repeat __________ times. Complete this exercise __________ times a day. Squats This exercise strengthens the muscles in front of your thigh and knee (quadriceps). 1. Stand in front of a table, with your feet and knees pointing straight ahead. You may rest your hands on the table for balance but not for support. 2. Slowly bend your knees and lower your hips like you are going to sit in a chair. ? Keep your weight over your heels, not over your toes. ? Keep your lower legs upright so they are parallel with the table legs. ? Do not let your hips go lower than your knees. ? Do not bend lower than told by your health care provider. ? If your hip pain increases, do not bend as low. 3. Hold the squat position for __________ seconds. 4. Slowly push with your legs to return to standing. Do not use your hands to pull yourself to standing. Repeat __________ times. Complete this exercise __________ times a day. Hip hike 1. Stand sideways on a bottom step. Stand on your left / right leg with your other foot unsupported next to the step. You can hold on to the railing or wall for balance if  needed. 2. Keep your knees straight and your torso square. Then lift your left / right hip up toward the ceiling. 3. Hold this position for __________ seconds. 4. Slowly let your left / right hip lower toward the floor, past the starting position. Your foot should get closer to the floor. Do not lean or bend your knees. Repeat __________ times. Complete this exercise __________ times a day. Single leg stand 1. Without shoes, stand near  a railing or in a doorway. You may hold on to the railing or door frame as needed for balance. 2. Squeeze your left / right buttock muscles, then lift up your other foot. ? Do not let your left / right hip push out to the side. ? It is helpful to stand in front of a mirror for this exercise so you can watch your hip. 3. Hold this position for __________ seconds. Repeat __________ times. Complete this exercise __________ times a day. This information is not intended to replace advice given to you by your health care provider. Make sure you discuss any questions you have with your health care provider. Document Revised: 05/19/2018 Document Reviewed: 05/19/2018 Elsevier Patient Education  [place].

## 2020-01-08 NOTE — Addendum Note (Signed)
Addended by: Ofilia Neas on: 01/08/2020 02:04 PM   Modules accepted: Orders

## 2020-01-11 ENCOUNTER — Telehealth: Payer: Self-pay | Admitting: *Deleted

## 2020-01-11 NOTE — Telephone Encounter (Signed)
Patient called the office regarding her lab results. Returned patient's call and advised Glucose is elevated-199. Rest of CMP WNL. CBC WNL. Patient asked for a refill on MTX. Patient advised her prescription has been sent to the pharmacy on 01/08/2020. Patient advised to contact the office if she has any trouble with her prescription. Patient expressed understanding.

## 2020-01-11 NOTE — Progress Notes (Signed)
Glucose is elevated-199. Rest of CMP WNL.  CBC WNL.

## 2020-01-19 DIAGNOSIS — M5489 Other dorsalgia: Secondary | ICD-10-CM | POA: Diagnosis not present

## 2020-01-20 DIAGNOSIS — N951 Menopausal and female climacteric states: Secondary | ICD-10-CM | POA: Diagnosis not present

## 2020-01-25 DIAGNOSIS — M2550 Pain in unspecified joint: Secondary | ICD-10-CM | POA: Diagnosis not present

## 2020-01-25 DIAGNOSIS — R635 Abnormal weight gain: Secondary | ICD-10-CM | POA: Diagnosis not present

## 2020-01-25 DIAGNOSIS — R7989 Other specified abnormal findings of blood chemistry: Secondary | ICD-10-CM | POA: Diagnosis not present

## 2020-01-25 DIAGNOSIS — Z1339 Encounter for screening examination for other mental health and behavioral disorders: Secondary | ICD-10-CM | POA: Diagnosis not present

## 2020-01-25 DIAGNOSIS — R5383 Other fatigue: Secondary | ICD-10-CM | POA: Diagnosis not present

## 2020-01-25 DIAGNOSIS — Z1331 Encounter for screening for depression: Secondary | ICD-10-CM | POA: Diagnosis not present

## 2020-01-25 DIAGNOSIS — J441 Chronic obstructive pulmonary disease with (acute) exacerbation: Secondary | ICD-10-CM | POA: Diagnosis not present

## 2020-01-25 DIAGNOSIS — N951 Menopausal and female climacteric states: Secondary | ICD-10-CM | POA: Diagnosis not present

## 2020-01-25 DIAGNOSIS — F418 Other specified anxiety disorders: Secondary | ICD-10-CM | POA: Diagnosis not present

## 2020-01-25 DIAGNOSIS — L659 Nonscarring hair loss, unspecified: Secondary | ICD-10-CM | POA: Diagnosis not present

## 2020-01-25 DIAGNOSIS — J45909 Unspecified asthma, uncomplicated: Secondary | ICD-10-CM | POA: Diagnosis not present

## 2020-02-05 DIAGNOSIS — E782 Mixed hyperlipidemia: Secondary | ICD-10-CM | POA: Diagnosis not present

## 2020-02-05 DIAGNOSIS — G720 Drug-induced myopathy: Secondary | ICD-10-CM | POA: Diagnosis not present

## 2020-02-05 DIAGNOSIS — E119 Type 2 diabetes mellitus without complications: Secondary | ICD-10-CM | POA: Diagnosis not present

## 2020-02-05 DIAGNOSIS — J45909 Unspecified asthma, uncomplicated: Secondary | ICD-10-CM | POA: Diagnosis not present

## 2020-02-05 DIAGNOSIS — I10 Essential (primary) hypertension: Secondary | ICD-10-CM | POA: Diagnosis not present

## 2020-03-03 DIAGNOSIS — R5383 Other fatigue: Secondary | ICD-10-CM | POA: Diagnosis not present

## 2020-03-03 DIAGNOSIS — R7989 Other specified abnormal findings of blood chemistry: Secondary | ICD-10-CM | POA: Diagnosis not present

## 2020-03-03 DIAGNOSIS — E559 Vitamin D deficiency, unspecified: Secondary | ICD-10-CM | POA: Diagnosis not present

## 2020-03-04 ENCOUNTER — Other Ambulatory Visit: Payer: Self-pay

## 2020-03-04 ENCOUNTER — Ambulatory Visit: Payer: PPO | Admitting: Rheumatology

## 2020-03-04 ENCOUNTER — Encounter: Payer: Self-pay | Admitting: Rheumatology

## 2020-03-04 VITALS — BP 147/84 | HR 93 | Resp 16 | Ht 60.0 in | Wt 130.8 lb

## 2020-03-04 DIAGNOSIS — M51369 Other intervertebral disc degeneration, lumbar region without mention of lumbar back pain or lower extremity pain: Secondary | ICD-10-CM

## 2020-03-04 DIAGNOSIS — M797 Fibromyalgia: Secondary | ICD-10-CM | POA: Diagnosis not present

## 2020-03-04 DIAGNOSIS — M19071 Primary osteoarthritis, right ankle and foot: Secondary | ICD-10-CM

## 2020-03-04 DIAGNOSIS — M0579 Rheumatoid arthritis with rheumatoid factor of multiple sites without organ or systems involvement: Secondary | ICD-10-CM

## 2020-03-04 DIAGNOSIS — M19041 Primary osteoarthritis, right hand: Secondary | ICD-10-CM | POA: Diagnosis not present

## 2020-03-04 DIAGNOSIS — M5030 Other cervical disc degeneration, unspecified cervical region: Secondary | ICD-10-CM

## 2020-03-04 DIAGNOSIS — M19072 Primary osteoarthritis, left ankle and foot: Secondary | ICD-10-CM

## 2020-03-04 DIAGNOSIS — M5136 Other intervertebral disc degeneration, lumbar region: Secondary | ICD-10-CM | POA: Diagnosis not present

## 2020-03-04 DIAGNOSIS — M62838 Other muscle spasm: Secondary | ICD-10-CM | POA: Diagnosis not present

## 2020-03-04 DIAGNOSIS — M533 Sacrococcygeal disorders, not elsewhere classified: Secondary | ICD-10-CM

## 2020-03-04 DIAGNOSIS — M19042 Primary osteoarthritis, left hand: Secondary | ICD-10-CM

## 2020-03-04 DIAGNOSIS — R5383 Other fatigue: Secondary | ICD-10-CM

## 2020-03-04 DIAGNOSIS — Z7189 Other specified counseling: Secondary | ICD-10-CM

## 2020-03-04 DIAGNOSIS — G8929 Other chronic pain: Secondary | ICD-10-CM

## 2020-03-04 DIAGNOSIS — Z8639 Personal history of other endocrine, nutritional and metabolic disease: Secondary | ICD-10-CM

## 2020-03-04 DIAGNOSIS — Z1589 Genetic susceptibility to other disease: Secondary | ICD-10-CM | POA: Diagnosis not present

## 2020-03-04 DIAGNOSIS — M7062 Trochanteric bursitis, left hip: Secondary | ICD-10-CM | POA: Diagnosis not present

## 2020-03-04 DIAGNOSIS — Z8659 Personal history of other mental and behavioral disorders: Secondary | ICD-10-CM

## 2020-03-04 DIAGNOSIS — M7061 Trochanteric bursitis, right hip: Secondary | ICD-10-CM | POA: Diagnosis not present

## 2020-03-04 DIAGNOSIS — Z8669 Personal history of other diseases of the nervous system and sense organs: Secondary | ICD-10-CM

## 2020-03-04 DIAGNOSIS — Z8709 Personal history of other diseases of the respiratory system: Secondary | ICD-10-CM

## 2020-03-04 DIAGNOSIS — Z79899 Other long term (current) drug therapy: Secondary | ICD-10-CM

## 2020-03-04 MED ORDER — TRIAMCINOLONE ACETONIDE 40 MG/ML IJ SUSP
40.0000 mg | INTRAMUSCULAR | Status: AC | PRN
  Administered 2020-03-04: 40 mg via INTRA_ARTICULAR

## 2020-03-04 MED ORDER — LIDOCAINE HCL 1 % IJ SOLN
1.5000 mL | INTRAMUSCULAR | Status: AC | PRN
  Administered 2020-03-04: 1.5 mL

## 2020-03-04 NOTE — Patient Instructions (Addendum)
Standing Labs We placed an order today for your standing lab work.   Please have your standing labs drawn in March and every 3 months  If possible, please have your labs drawn 2 weeks prior to your appointment so that the provider can discuss your results at your appointment.  We have open lab daily Monday through Thursday from 8:30-12:30 PM and 1:30-4:30 PM and Friday from 8:30-12:30 PM and 1:30-4:00 PM at the office of Dr. Bo Merino, [facility].   Please be advised, all patients with office appointments requiring lab work will take precedents over walk-in lab work.  If possible, please come for your lab work on Monday and Friday afternoons, as you may experience shorter wait times. The office is located at [address] [place], [place], [place] [address] No appointment is necessary.   Labs are drawn by [facility]. Please bring your co-pay at the time of your lab draw.  You may receive a bill from [facility] for your lab work.  If you wish to have your labs drawn at another location, please call the office 24 hours in advance to send orders.  If you have any questions regarding directions or hours of operation,  please call [phone].   As a reminder, please drink plenty of water prior to coming for your lab work. Thanks!   COVID-19 vaccine recommendations:   COVID-19 vaccine is recommended for everyone (unless you are allergic to a vaccine component), even if you are on a medication that suppresses your immune system.   If you are on Methotrexate, Cellcept (mycophenolate), Rinvoq, Morrie Sheldon, and Olumiant- hold the medication for 1 week after each vaccine. Hold Methotrexate for 2 weeks after the single dose COVID-19 vaccine.  Do not take Tylenol or any anti-inflammatory medications (NSAIDs) 24 hours prior to the COVID-19 vaccination.   There is no direct evidence about the efficacy of the COVID-19 vaccine in individuals who are on medications that suppress the immune  system.   Even if you are fully vaccinated, and you are on any medications that suppress your immune system, please continue to wear a mask, maintain at least six feet social distance and practice hand hygiene.   If you develop a COVID-19 infection, please contact your PCP or our office to determine if you need monoclonal antibody infusion.  The booster vaccine is now available for immunocompromised patients.   Please see the following web sites for updated information.   [email or web address]-Patient-[email or web address]   Cervical Strain and Sprain Rehab Ask your health care provider which exercises are safe for you. Do exercises exactly as told by your health care provider and adjust them as directed. It is normal to feel mild stretching, pulling, tightness, or discomfort as you do these exercises. Stop right away if you feel sudden pain or your pain gets worse. Do not begin these exercises until told by your health care provider. Stretching and range-of-motion exercises Cervical side bending 1. Using good posture, sit on a stable chair or stand up. 2. Without moving your shoulders, slowly tilt your left / right ear to your shoulder until you feel a stretch in the opposite side neck muscles. You should be looking straight ahead. 3. Hold for __________ seconds. 4. Repeat with the other side of your neck. Repeat __________ times. Complete this exercise __________ times a day.   Cervical rotation 1. Using good posture, sit on a stable chair or stand up. 2. Slowly turn your head to the side as if you are looking over  your left / right shoulder. ? Keep your eyes level with the ground. ? Stop when you feel a stretch along the side and the back of your neck. 3. Hold for __________ seconds. 4. Repeat this by turning to your other side. Repeat __________ times. Complete this exercise __________ times a day.   Thoracic extension and pectoral  stretch 1. Roll a towel or a small blanket so it is about 4 inches (10 cm) in diameter. 2. Lie down on your back on a firm surface. 3. Put the towel lengthwise, under your spine in the middle of your back. It should not be under your shoulder blades. The towel should line up with your spine from your middle back to your lower back. 4. Put your hands behind your head and let your elbows fall out to your sides. 5. Hold for __________ seconds. Repeat __________ times. Complete this exercise __________ times a day. Strengthening exercises Isometric upper cervical flexion 1. Lie on your back with a thin pillow behind your head and a small rolled-up towel under your neck. 2. Gently tuck your chin toward your chest and nod your head down to look toward your feet. Do not lift your head off the pillow. 3. Hold for __________ seconds. 4. Release the tension slowly. Relax your neck muscles completely before you repeat this exercise. Repeat __________ times. Complete this exercise __________ times a day. Isometric cervical extension 1. Stand about 6 inches (15 cm) away from a wall, with your back facing the wall. 2. Place a soft object, about 6-8 inches (15-20 cm) in diameter, between the back of your head and the wall. A soft object could be a small pillow, a ball, or a folded towel. 3. Gently tilt your head back and press into the soft object. Keep your jaw and forehead relaxed. 4. Hold for __________ seconds. 5. Release the tension slowly. Relax your neck muscles completely before you repeat this exercise. Repeat __________ times. Complete this exercise __________ times a day.   Posture and body mechanics Body mechanics refers to the movements and positions of your body while you do your daily activities. Posture is part of body mechanics. Good posture and healthy body mechanics can help to relieve stress in your body's tissues and joints. Good posture means that your spine is in its natural S-curve  position (your spine is neutral), your shoulders are pulled back slightly, and your head is not tipped forward. The following are general guidelines for applying improved posture and body mechanics to your everyday activities. Sitting 1. When sitting, keep your spine neutral and keep your feet flat on the floor. Use a footrest, if necessary, and keep your thighs parallel to the floor. Avoid rounding your shoulders, and avoid tilting your head forward. 2. When working at a desk or a computer, keep your desk at a height where your hands are slightly lower than your elbows. Slide your chair under your desk so you are close enough to maintain good posture. 3. When working at a computer, place your monitor at a height where you are looking straight ahead and you do not have to tilt your head forward or downward to look at the screen.   Standing  When standing, keep your spine neutral and keep your feet about hip-width apart. Keep a slight bend in your knees. Your ears, shoulders, and hips should line up.  When you do a task in which you stand in one place for a long time, place one foot  up on a stable object that is 2-4 inches (5-10 cm) high, such as a footstool. This helps keep your spine neutral.   Resting When lying down and resting, avoid positions that are most painful for you. Try to support your neck in a neutral position. You can use a contour pillow or a small rolled-up towel. Your pillow should support your neck but not push on it. This information is not intended to replace advice given to you by your health care provider. Make sure you discuss any questions you have with your health care provider. Document Revised: 05/14/2018 Document Reviewed: 10/23/2017 Elsevier Patient Education  2021 [employer].

## 2020-03-04 NOTE — Progress Notes (Signed)
Office Visit Note  Patient: Stephanie Norris             Date of Birth: 17-May-1955           MRN: [id number]             PCP: Celene Squibb, MD Referring: Celene Squibb, MD Visit Date: 03/04/2020 Occupation: _0 @  Subjective:  Other (Left hip pain )   History of Present Illness: Stephanie Norris is a 65 y.o. female history of rheumatoid arthritis, osteoarthritis, degenerative disc disease and fibromyalgia.  She states her fibromyalgia is fairly well controlled.  She continues to have some discomfort in her joints from underlying osteoarthritis.  She has pain and stiffness in her hands.  She denies any joint swelling.  She states for the last 3 days she has been having pain over the left trochanteric bursa and difficulty sleeping on the side.  She is requesting a cortisone injection.  Activities of Daily Living:  Patient reports morning stiffness for 30 minutes.   Patient Reports nocturnal pain.  Difficulty dressing/grooming: Denies Difficulty climbing stairs: Reports Difficulty getting out of chair: Denies Difficulty using hands for taps, buttons, cutlery, and/or writing: Reports  Review of Systems  Constitutional: Positive for fatigue.  HENT: Positive for mouth dryness. Negative for mouth sores and nose dryness.   Eyes: Positive for itching. Negative for pain and dryness.  Respiratory: Negative for shortness of breath and difficulty breathing.   Cardiovascular: Negative for chest pain and palpitations.  Gastrointestinal: Negative for blood in stool, constipation and diarrhea.  Endocrine: Negative for increased urination.  Genitourinary: Negative for difficulty urinating.  Musculoskeletal: Positive for arthralgias, joint pain, joint swelling, myalgias, morning stiffness, muscle tenderness and myalgias.  Skin: Negative for color change, rash and redness.  Allergic/Immunologic: Negative for susceptible to infections.  Neurological: Positive for dizziness, headaches, memory loss  and weakness. Negative for numbness.  Hematological: Positive for bruising/bleeding tendency.  Psychiatric/Behavioral: Positive for confusion.    PMFS History:  Patient Active Problem List   Diagnosis Date Noted  . Vertigo 08/20/2016  . Eustachian tube dysfunction 04/17/2016  . Abdominal pain 03/02/2016  . Gastroenteritis 03/02/2016  . Rheumatoid arthritis with rheumatoid factor of multiple sites without organ or systems involvement ([place]) 01/13/2016  . HLA B27 (HLA B27 positive) 01/13/2016  . Osteoarthritis of foot 01/13/2016  . DJD (degenerative joint disease), cervical 01/13/2016  . Spondylosis of lumbar region without myelopathy or radiculopathy 01/13/2016  . Primary osteoarthritis of both hands 01/13/2016  . Non-seasonal allergic rhinitis 08/13/2014  . Stiffness of joints, not elsewhere classified, multiple sites 08/26/2013  . Flu-like symptoms 03/02/2013  . Lactic acidosis 03/02/2013  . Sinus tachycardia 03/02/2013  . Elevated lactic acid level 03/02/2013  . Influenza due to identified novel influenza A virus with other respiratory manifestations 03/02/2013  . Hyperglycemia 03/02/2013  . Fibromyalgia   . OSA (obstructive sleep apnea) 06/22/2011  . DOE (dyspnea on exertion) 05/30/2011  . Moderate persistent asthma 03/30/2011    Past Medical History:  Diagnosis Date  . Asthma   . COPD (chronic obstructive pulmonary disease) ([place])   . Diabetes mellitus without complication (HCC)    Type 2  . Dizziness   . Fibromyalgia   . GERD (gastroesophageal reflux disease)   . Headache   . Hyperlipidemia   . OSA (obstructive sleep apnea)   . Osteoarthritis   . PCOS (polycystic ovarian syndrome)   . Raynaud disease   . Rheumatoid arthritis ([place])  Family History  Problem Relation Age of Onset  . Asthma Mother   . Rheum arthritis Mother   . Allergic rhinitis Mother   . Rheum arthritis Sister   . Allergic rhinitis Sister   . Heart failure Father   . Heart disease Father    . Rheum arthritis Sister   . Allergic rhinitis Sister   . Allergic rhinitis Brother   . Allergies Other        "Everyone"  . Heart disease Maternal Grandfather   . Heart disease Maternal Grandmother   . Heart disease Paternal Grandfather   . Heart disease Paternal Grandmother    Past Surgical History:  Procedure Laterality Date  . APPENDECTOMY    . BACK SURGERY  2021  . CHOLECYSTECTOMY    . COLONOSCOPY W/ BIOPSIES  fall 2009  . COMBINED HYSTERECTOMY VAGINAL / OOPHORECTOMY / A&P REPAIR  10/2002   endometriosis, cystocele, fibroids  . KNEE SURGERY  1996  . TOE SURGERY    . VESICOVAGINAL FISTULA CLOSURE W/ TAH     Social History   Social History Narrative   Lives with husband   Retired    no children   Ass[employer] degree   16 oz caffeine daily   Immunization History  Administered Date(s) Administered  . Influenza Whole 10/07/2010, 11/06/2011  . Influenza,inj,Quad PF,6+ Mos 11/05/2012, 11/02/2015, 11/22/2016, 11/15/2017  . Influenza-Unspecified 11/05/2013  . Moderna Sars-Covid-2 Vaccination 04/13/2019, 05/11/2019  . Pneumococcal Conjugate-13 11/06/2014  . Pneumococcal Polysaccharide-23 11/02/2015     Objective: Vital Signs: BP (!) 147/84 (BP Location: Left Arm, Patient Position: Sitting, Cuff Size: Normal)   Pulse 93   Resp 16   Ht 5' (1.524 m)   Wt 130 lb 12.8 oz (59.3 kg)   BMI 25.55 kg/m    Physical Exam Vitals and nursing note reviewed.  Constitutional:      Appearance: She is well-developed and well-nourished.  HENT:     Head: Normocephalic and atraumatic.  Eyes:     Extraocular Movements: EOM normal.     Conjunctiva/sclera: Conjunctivae normal.  Cardiovascular:     Rate and Rhythm: Normal rate and regular rhythm.     Pulses: Intact distal pulses.     Heart sounds: Normal heart sounds.  Pulmonary:     Effort: Pulmonary effort is normal.     Breath sounds: Normal breath sounds.  Abdominal:     General: Bowel sounds are normal.     Palpations: Abdomen is  soft.  Musculoskeletal:     Cervical back: Normal range of motion.  Lymphadenopathy:     Cervical: No cervical adenopathy.  Skin:    General: Skin is warm and dry.     Capillary Refill: Capillary refill takes less than 2 seconds.  Neurological:     Mental Status: She is alert and oriented to person, place, and time.  Psychiatric:        Mood and Affect: Mood and affect normal.        Behavior: Behavior normal.      Musculoskeletal Exam: C-spine, thoracic and lumbar spine were not limited range of motion.  She has some discomfort with left lateral rotation.  Shoulder joints, elbow joints, wrist joints with good range of motion.  She had no synovitis over MCPs.  PIP and DIP thickening was noted.  Her mucinous cyst was present over the left index finger DIP joint.  Hip joints, knee joints, ankles with good range of motion.  She had no tenderness over MTPs.  She  had tenderness on palpation over left trochanteric bursa consistent with trochanteric bursitis.  CDAI Exam: CDAI Score: 0.5  Patient Global: 3 mm; Provider Global: 2 mm Swollen: 0 ; Tender: 0  Joint Exam 03/04/2020   No joint exam has been documented for this visit   There is currently no information documented on the homunculus. Go to the Rheumatology activity and complete the homunculus joint exam.  Investigation: No additional findings.  Imaging: No results found.  Recent Labs: Lab Results  Component Value Date   WBC 9.6 01/08/2020   HGB 12.0 01/08/2020   PLT 322 01/08/2020   NA 136 01/08/2020   K 4.5 01/08/2020   CL 105 01/08/2020   CO2 22 01/08/2020   GLUCOSE 199 (H) 01/08/2020   BUN 23 01/08/2020   CREATININE 0.66 01/08/2020   BILITOT 0.2 01/08/2020   ALKPHOS 54 09/10/2016   AST 12 01/08/2020   ALT 17 01/08/2020   PROT 6.1 01/08/2020   ALBUMIN 4.0 09/10/2016   CALCIUM 9.6 01/08/2020   GFRAA 107 01/08/2020    Speciality Comments: No specialty comments available.  Procedures:  Large Joint Inj: L  greater trochanter on 03/04/2020 11:40 AM Indications: pain Details: 27 G 1.5 in needle, lateral approach  Arthrogram: No  Medications: 40 mg triamcinolone acetonide 40 MG/ML; 1.5 mL lidocaine 1 % Aspirate: 0 mL Outcome: tolerated well, no immediate complications Procedure, treatment alternatives, risks and benefits explained, specific risks discussed. Consent was given by the patient. Immediately prior to procedure a time out was called to verify the correct patient, procedure, equipment, support staff and site/side marked as required. Patient was prepped and draped in the usual sterile fashion.     Allergies: Nsaids, Bactrim [sulfamethoxazole-trimethoprim], Cephalexin, Levofloxacin, and Macrobid [nitrofurantoin]   Assessment / Plan:     Visit Diagnoses: Rheumatoid arthritis with rheumatoid factor of multiple sites without organ or systems involvement ([place]) - +CCP: Patient had no synovitis on my examination.  She has been tolerating methotrexate well which is controlling her symptoms.  High risk medication use - Methotrexate 6 tablets by mouth every 7 days and folic acid 1 mg 2 tablets daily.  Her labs have been stable.  She has been advised to get labs every 3 months.  HLA B27 (HLA B27 positive)  Primary osteoarthritis of both hands-she has bilateral PIP and DIP thickening.  A mucinous cyst was present over her left index finger DIP joint.  It is not causing much discomfort currently.  Primary osteoarthritis of both feet-proper fitting shoes were discussed.  Fibromyalgia-she continues to have some generalized discomfort from fibromyalgia.  Trapezius muscle spasm - trigger point injections: 01/08/2020  Trochanteric bursitis of both hips-she has been having severe pain and discomfort over the left trochanteric bursa over the last 3 days.  She is having difficulty sleeping on the left side.  Per her request left trochanteric bursa was injected with a cortisone injection as described above.   Side effects are reviewed.  Postprocedure precautions were discussed.  DDD (degenerative disc disease), cervical-she is having some discomfort with the left lateral rotation.  Have given her a handout on C-spine exercises.  DDD (degenerative disc disease), lumbar - Discectomy performed April 2021 by Dr. Arnoldo Morale.  She continues to have some lower back discomfort.  Chronic SI joint pain - A cortisone injection was performed on 08/07/19.   Other fatigue  History of depression  History of asthma  History of sleep apnea  History of diabetes mellitus  Educated about COVID-19 virus infection-patient  is fully vaccinated against COVID-19.  She is advised to get a third dose in a booster 6 months later.  She was advised to delay methotrexate by 1 week after the vaccination.  Use of mask, social distancing and hand hygiene was discussed.  Orders: Orders Placed This Encounter  Procedures  . Large Joint Inj   No orders of the defined types were placed in this encounter.     Follow-Up Instructions: Return in about 5 months (around 08/02/2020) for Rheumatoid arthritis, Osteoarthritis.   Bo Merino, MD  Note - This record has been created using Editor, commissioning.  Chart creation errors have been sought, but may not always  have been located. Such creation errors do not reflect on  the standard of medical care.

## 2020-03-14 DIAGNOSIS — L905 Scar conditions and fibrosis of skin: Secondary | ICD-10-CM | POA: Diagnosis not present

## 2020-03-14 DIAGNOSIS — L728 Other follicular cysts of the skin and subcutaneous tissue: Secondary | ICD-10-CM | POA: Diagnosis not present

## 2020-03-14 DIAGNOSIS — L720 Epidermal cyst: Secondary | ICD-10-CM | POA: Diagnosis not present

## 2020-03-16 DIAGNOSIS — M2550 Pain in unspecified joint: Secondary | ICD-10-CM | POA: Diagnosis not present

## 2020-03-16 DIAGNOSIS — R7989 Other specified abnormal findings of blood chemistry: Secondary | ICD-10-CM | POA: Diagnosis not present

## 2020-03-16 DIAGNOSIS — E039 Hypothyroidism, unspecified: Secondary | ICD-10-CM | POA: Diagnosis not present

## 2020-03-16 DIAGNOSIS — N951 Menopausal and female climacteric states: Secondary | ICD-10-CM | POA: Diagnosis not present

## 2020-03-16 DIAGNOSIS — R5383 Other fatigue: Secondary | ICD-10-CM | POA: Diagnosis not present

## 2020-03-23 ENCOUNTER — Telehealth: Payer: Self-pay | Admitting: Orthopaedic Surgery

## 2020-03-23 NOTE — Telephone Encounter (Signed)
Called patient left message to return call concerning needing to R/S her appointment with Dr. Erlinda Hong.   Per mychart message from patient

## 2020-03-29 ENCOUNTER — Ambulatory Visit (INDEPENDENT_AMBULATORY_CARE_PROVIDER_SITE_OTHER): Payer: PPO

## 2020-03-29 ENCOUNTER — Ambulatory Visit (INDEPENDENT_AMBULATORY_CARE_PROVIDER_SITE_OTHER): Payer: PPO | Admitting: Orthopaedic Surgery

## 2020-03-29 ENCOUNTER — Other Ambulatory Visit: Payer: Self-pay

## 2020-03-29 DIAGNOSIS — M67442 Ganglion, left hand: Secondary | ICD-10-CM

## 2020-03-29 DIAGNOSIS — M151 Heberden's nodes (with arthropathy): Secondary | ICD-10-CM

## 2020-03-29 NOTE — Progress Notes (Signed)
Office Visit Note   Patient: Stephanie Norris           Date of Birth: September 17, 1955           MRN: [id number] Visit Date: 03/29/2020              Requested by: Celene Squibb, MD [address] [place],   [place] [id number] PCP: Celene Squibb, MD   Assessment & Plan: Visit Diagnoses:  1. Osteoarthritis of distal interphalangeal (DIP) joint of left index finger   2. Digital mucous cyst of finger of left hand     Plan: Impression is left hand index finger DIP joint mucous cyst and DIP arthrosis.  She has severe pain and drainage by dermatologist has not resolved it.  Based on discussion of treatment options, she has elected to have this surgical removed and joint debridement.  Risk benefits rehab recovery reviewed.  All questions answered.  Total face to face encounter time was greater than 25 minutes and over half of this time was spent in counseling and/or coordination of care.  Follow-Up Instructions: Return if symptoms worsen or fail to improve.   Orders:  Orders Placed This Encounter  Procedures  . XR Finger Index Left   No orders of the defined types were placed in this encounter.     Procedures: No procedures performed   Clinical Data: No additional findings.   Subjective: Chief Complaint  Patient presents with  . Left Hand - Pain  . Hand Pain    HPI patient is a very pleasant 65 year old female with a history of rheumatoid arthritis and diabetes who comes in today with a painful and tender cyst to the left hand second finger DIP joint. She has had this for several years. She has been seen by dermatologist where it was excised, but the cyst did return. She notes that it took about 4 months to heal. She has significant tenderness which inhibits her from doing things such as putting her hands in her pocket. She has noticed drainage. No fevers or chills.  Review of Systems as detailed in HPI. All others reviewed and are negative.   Objective: Vital Signs: There were  no vitals taken for this visit.  Physical Exam well-developed well-nourished female no acute distress. Alert and oriented x3.  Ortho Exam left hand index finger shows a pea-sized, tender cyst to the DIP joint. No active drainage. No signs of infection. She is neurovascular intact distally.  Specialty Comments:  No specialty comments available.  Imaging: XR Finger Index Left  Result Date: 03/29/2020 Marked degenerative changes to the pip and dip joints with large dorsal osteophyte of the DIP joint    PMFS History: Patient Active Problem List   Diagnosis Date Noted  . Vertigo 08/20/2016  . Eustachian tube dysfunction 04/17/2016  . Abdominal pain 03/02/2016  . Gastroenteritis 03/02/2016  . Rheumatoid arthritis with rheumatoid factor of multiple sites without organ or systems involvement ([place]) 01/13/2016  . HLA B27 (HLA B27 positive) 01/13/2016  . Osteoarthritis of foot 01/13/2016  . DJD (degenerative joint disease), cervical 01/13/2016  . Spondylosis of lumbar region without myelopathy or radiculopathy 01/13/2016  . Primary osteoarthritis of both hands 01/13/2016  . Non-seasonal allergic rhinitis 08/13/2014  . Stiffness of joints, not elsewhere classified, multiple sites 08/26/2013  . Flu-like symptoms 03/02/2013  . Lactic acidosis 03/02/2013  . Sinus tachycardia 03/02/2013  . Elevated lactic acid level 03/02/2013  . Influenza due to identified novel influenza A virus  with other respiratory manifestations 03/02/2013  . Hyperglycemia 03/02/2013  . Fibromyalgia   . OSA (obstructive sleep apnea) 06/22/2011  . DOE (dyspnea on exertion) 05/30/2011  . Moderate persistent asthma 03/30/2011   Past Medical History:  Diagnosis Date  . Asthma   . COPD (chronic obstructive pulmonary disease) ([place])   . Diabetes mellitus without complication (HCC)    Type 2  . Dizziness   . Fibromyalgia   . GERD (gastroesophageal reflux disease)   . Headache   . Hyperlipidemia   . OSA (obstructive  sleep apnea)   . Osteoarthritis   . PCOS (polycystic ovarian syndrome)   . Raynaud disease   . Rheumatoid arthritis ([place])     Family History  Problem Relation Age of Onset  . Asthma Mother   . Rheum arthritis Mother   . Allergic rhinitis Mother   . Rheum arthritis Sister   . Allergic rhinitis Sister   . Heart failure Father   . Heart disease Father   . Rheum arthritis Sister   . Allergic rhinitis Sister   . Allergic rhinitis Brother   . Allergies Other        "Everyone"  . Heart disease Maternal Grandfather   . Heart disease Maternal Grandmother   . Heart disease Paternal Grandfather   . Heart disease Paternal Grandmother     Past Surgical History:  Procedure Laterality Date  . APPENDECTOMY    . BACK SURGERY  2021  . CHOLECYSTECTOMY    . COLONOSCOPY W/ BIOPSIES  fall 2009  . COMBINED HYSTERECTOMY VAGINAL / OOPHORECTOMY / A&P REPAIR  10/2002   endometriosis, cystocele, fibroids  . KNEE SURGERY  1996  . TOE SURGERY    . VESICOVAGINAL FISTULA CLOSURE W/ TAH     Social History   Occupational History  . Occupation: Patient Account Rep    Employer: [employer]  Tobacco Use  . Smoking status: Never Smoker  . Smokeless tobacco: Never Used  Vaping Use  . Vaping Use: Never used  Substance and Sexual Activity  . Alcohol use: No  . Drug use: No  . Sexual activity: Not on file

## 2020-03-31 ENCOUNTER — Ambulatory Visit: Payer: PPO | Admitting: Orthopaedic Surgery

## 2020-04-03 ENCOUNTER — Other Ambulatory Visit: Payer: Self-pay | Admitting: Physician Assistant

## 2020-04-04 ENCOUNTER — Other Ambulatory Visit: Payer: Self-pay | Admitting: Physician Assistant

## 2020-04-04 DIAGNOSIS — E782 Mixed hyperlipidemia: Secondary | ICD-10-CM | POA: Diagnosis not present

## 2020-04-04 DIAGNOSIS — E119 Type 2 diabetes mellitus without complications: Secondary | ICD-10-CM | POA: Diagnosis not present

## 2020-04-04 DIAGNOSIS — G720 Drug-induced myopathy: Secondary | ICD-10-CM | POA: Diagnosis not present

## 2020-04-04 MED ORDER — HYDROCODONE-ACETAMINOPHEN 5-325 MG PO TABS: 1.0000 | ORAL_TABLET | Freq: Three times a day (TID) | ORAL | 0 refills | Status: DC | PRN

## 2020-04-04 MED ORDER — ONDANSETRON HCL 4 MG PO TABS: 4.0000 mg | ORAL_TABLET | Freq: Three times a day (TID) | ORAL | 0 refills | Status: DC | PRN

## 2020-04-04 NOTE — Telephone Encounter (Signed)
Last Visit: 03/04/2020 Next Visit: 08/02/2020 Labs: 01/08/2020 Glucose is elevated-199. Rest of CMP WNL. CBC WNL.   Current Dose per office note 03/04/2020: Methotrexate 6 tablets by mouth every 7 days  DX: Rheumatoid arthritis with rheumatoid factor of multiple sites without organ or systems involvement   Last Fill: 01/08/2020  Patient advised she is due to update labs. Patient states she is having lab work for PCP and will update ours as well.   Okay to refill MTX?

## 2020-04-07 ENCOUNTER — Encounter: Payer: Self-pay | Admitting: Orthopaedic Surgery

## 2020-04-07 DIAGNOSIS — M67442 Ganglion, left hand: Secondary | ICD-10-CM | POA: Diagnosis not present

## 2020-04-13 DIAGNOSIS — Z712 Person consulting for explanation of examination or test findings: Secondary | ICD-10-CM | POA: Diagnosis not present

## 2020-04-13 DIAGNOSIS — E7849 Other hyperlipidemia: Secondary | ICD-10-CM | POA: Diagnosis not present

## 2020-04-13 DIAGNOSIS — D72829 Elevated white blood cell count, unspecified: Secondary | ICD-10-CM | POA: Diagnosis not present

## 2020-04-13 DIAGNOSIS — J441 Chronic obstructive pulmonary disease with (acute) exacerbation: Secondary | ICD-10-CM | POA: Diagnosis not present

## 2020-04-13 DIAGNOSIS — E1121 Type 2 diabetes mellitus with diabetic nephropathy: Secondary | ICD-10-CM | POA: Diagnosis not present

## 2020-04-13 DIAGNOSIS — E1142 Type 2 diabetes mellitus with diabetic polyneuropathy: Secondary | ICD-10-CM | POA: Diagnosis not present

## 2020-04-13 DIAGNOSIS — R7301 Impaired fasting glucose: Secondary | ICD-10-CM | POA: Diagnosis not present

## 2020-04-13 DIAGNOSIS — I10 Essential (primary) hypertension: Secondary | ICD-10-CM | POA: Diagnosis not present

## 2020-04-14 ENCOUNTER — Ambulatory Visit (INDEPENDENT_AMBULATORY_CARE_PROVIDER_SITE_OTHER): Payer: PPO | Admitting: Physician Assistant

## 2020-04-14 ENCOUNTER — Encounter: Payer: Self-pay | Admitting: Orthopaedic Surgery

## 2020-04-14 ENCOUNTER — Other Ambulatory Visit: Payer: Self-pay

## 2020-04-14 VITALS — Ht 60.0 in | Wt 130.0 lb

## 2020-04-14 DIAGNOSIS — Z9889 Other specified postprocedural states: Secondary | ICD-10-CM

## 2020-04-14 MED ORDER — MUPIROCIN 2 % EX OINT: 1.0000 "application " | TOPICAL_OINTMENT | Freq: Two times a day (BID) | CUTANEOUS | 0 refills | Status: DC

## 2020-04-14 NOTE — Progress Notes (Signed)
Post-Op Visit Note   Patient: Stephanie Norris           Date of Birth: 1955/03/28           MRN: [id number] Visit Date: 04/14/2020 PCP: Celene Squibb, MD   Assessment & Plan:  Chief Complaint:  Chief Complaint  Patient presents with  . Left Hand - Follow-up    Left index finger cyst incision 04/07/2020   Visit Diagnoses:  1. H/O excision of ganglion cyst     Plan: Patient is a pleasant 65 year old female who comes in today 1 week out left index finger dorsal mucous cyst excision.  She has been doing okay.  Minimal to no pain with finger.  Examination of her left index finger reveals a well-healing surgical incision with nylon sutures in place.  No evidence of infection or cellulitis.  Fingers are warm and well-perfused.  Today, her wound was redressed with mupirocin and a Band-Aid.  I have also called in mupirocin for her to use at home.  She may discontinue the splint and begin gentle range of motion she will follow up with [place] in 1 week for suture removal.  Call with concerns or questions in the meantime.  Follow-Up Instructions: Return in about 1 week (around 04/21/2020).   Orders:  No orders of the defined types were placed in this encounter.  No orders of the defined types were placed in this encounter.   Imaging: No new imaging  PMFS History: Patient Active Problem List   Diagnosis Date Noted  . Mucous cyst of digit of left hand 04/07/2020  . Vertigo 08/20/2016  . Eustachian tube dysfunction 04/17/2016  . Abdominal pain 03/02/2016  . Gastroenteritis 03/02/2016  . Rheumatoid arthritis with rheumatoid factor of multiple sites without organ or systems involvement ([place]) 01/13/2016  . HLA B27 (HLA B27 positive) 01/13/2016  . Osteoarthritis of foot 01/13/2016  . DJD (degenerative joint disease), cervical 01/13/2016  . Spondylosis of lumbar region without myelopathy or radiculopathy 01/13/2016  . Primary osteoarthritis of both hands 01/13/2016  . Non-seasonal allergic  rhinitis 08/13/2014  . Stiffness of joints, not elsewhere classified, multiple sites 08/26/2013  . Flu-like symptoms 03/02/2013  . Lactic acidosis 03/02/2013  . Sinus tachycardia 03/02/2013  . Elevated lactic acid level 03/02/2013  . Influenza due to identified novel influenza A virus with other respiratory manifestations 03/02/2013  . Hyperglycemia 03/02/2013  . Fibromyalgia   . OSA (obstructive sleep apnea) 06/22/2011  . DOE (dyspnea on exertion) 05/30/2011  . Moderate persistent asthma 03/30/2011   Past Medical History:  Diagnosis Date  . Asthma   . COPD (chronic obstructive pulmonary disease) ([place])   . Diabetes mellitus without complication (HCC)    Type 2  . Dizziness   . Fibromyalgia   . GERD (gastroesophageal reflux disease)   . Headache   . Hyperlipidemia   . OSA (obstructive sleep apnea)   . Osteoarthritis   . PCOS (polycystic ovarian syndrome)   . Raynaud disease   . Rheumatoid arthritis ([place])     Family History  Problem Relation Age of Onset  . Asthma Mother   . Rheum arthritis Mother   . Allergic rhinitis Mother   . Rheum arthritis Sister   . Allergic rhinitis Sister   . Heart failure Father   . Heart disease Father   . Rheum arthritis Sister   . Allergic rhinitis Sister   . Allergic rhinitis Brother   . Allergies Other        "  Everyone"  . Heart disease Maternal Grandfather   . Heart disease Maternal Grandmother   . Heart disease Paternal Grandfather   . Heart disease Paternal Grandmother     Past Surgical History:  Procedure Laterality Date  . APPENDECTOMY    . BACK SURGERY  2021  . CHOLECYSTECTOMY    . COLONOSCOPY W/ BIOPSIES  fall 2009  . COMBINED HYSTERECTOMY VAGINAL / OOPHORECTOMY / A&P REPAIR  10/2002   endometriosis, cystocele, fibroids  . KNEE SURGERY  1996  . TOE SURGERY    . VESICOVAGINAL FISTULA CLOSURE W/ TAH     Social History   Occupational History  . Occupation: Patient Account Rep    Employer: [employer]  Tobacco  Use  . Smoking status: Never Smoker  . Smokeless tobacco: Never Used  Vaping Use  . Vaping Use: Never used  Substance and Sexual Activity  . Alcohol use: No  . Drug use: No  . Sexual activity: Not on file

## 2020-04-18 DIAGNOSIS — H6591 Unspecified nonsuppurative otitis media, right ear: Secondary | ICD-10-CM | POA: Diagnosis not present

## 2020-04-18 DIAGNOSIS — J45909 Unspecified asthma, uncomplicated: Secondary | ICD-10-CM | POA: Diagnosis not present

## 2020-04-18 DIAGNOSIS — R0602 Shortness of breath: Secondary | ICD-10-CM | POA: Diagnosis not present

## 2020-04-18 DIAGNOSIS — J069 Acute upper respiratory infection, unspecified: Secondary | ICD-10-CM | POA: Diagnosis not present

## 2020-04-18 DIAGNOSIS — R52 Pain, unspecified: Secondary | ICD-10-CM | POA: Diagnosis not present

## 2020-04-18 DIAGNOSIS — R062 Wheezing: Secondary | ICD-10-CM | POA: Diagnosis not present

## 2020-04-18 DIAGNOSIS — R0981 Nasal congestion: Secondary | ICD-10-CM | POA: Diagnosis not present

## 2020-04-21 ENCOUNTER — Other Ambulatory Visit: Payer: Self-pay

## 2020-04-21 ENCOUNTER — Encounter: Payer: Self-pay | Admitting: Physician Assistant

## 2020-04-21 ENCOUNTER — Ambulatory Visit (INDEPENDENT_AMBULATORY_CARE_PROVIDER_SITE_OTHER): Payer: PPO | Admitting: Physician Assistant

## 2020-04-21 DIAGNOSIS — M67442 Ganglion, left hand: Secondary | ICD-10-CM

## 2020-04-21 NOTE — Progress Notes (Signed)
Post-Op Visit Note   Patient: Stephanie Norris           Date of Birth: Jun 09, 1955           MRN: [id number] Visit Date: 04/21/2020 PCP: Celene Squibb, MD   Assessment & Plan:  Chief Complaint:  Chief Complaint  Patient presents with  . Left Index Finger - Pain, Routine Post Op   Visit Diagnoses:  1. Mucous cyst of digit of left hand     Plan: Patient is a very pleasant 65 year old female who comes in today 2 weeks out left index finger mucous cyst excision.  She has been doing well.  She denies any pain to the finger.  Examination of the left index finger reveals a well-healed surgical incision with nylon sutures in place.  Very mild erythema.  Minimal tenderness.  No drainage.  She is neurovascular intact distally.  Today, sutures were removed.  We will have her to continue applying mupirocin and a Band-Aid twice daily for another 2 weeks.  She will follow up with [place] in 2 weeks time for recheck.  Call with concerns or questions in the meantime.  Follow-Up Instructions: Return in about 2 weeks (around 05/05/2020).   Orders:  No orders of the defined types were placed in this encounter.  No orders of the defined types were placed in this encounter.   Imaging: No new imaging  PMFS History: Patient Active Problem List   Diagnosis Date Noted  . Mucous cyst of digit of left hand 04/07/2020  . Vertigo 08/20/2016  . Eustachian tube dysfunction 04/17/2016  . Abdominal pain 03/02/2016  . Gastroenteritis 03/02/2016  . Rheumatoid arthritis with rheumatoid factor of multiple sites without organ or systems involvement ([place]) 01/13/2016  . HLA B27 (HLA B27 positive) 01/13/2016  . Osteoarthritis of foot 01/13/2016  . DJD (degenerative joint disease), cervical 01/13/2016  . Spondylosis of lumbar region without myelopathy or radiculopathy 01/13/2016  . Primary osteoarthritis of both hands 01/13/2016  . Non-seasonal allergic rhinitis 08/13/2014  . Stiffness of joints, not elsewhere  classified, multiple sites 08/26/2013  . Flu-like symptoms 03/02/2013  . Lactic acidosis 03/02/2013  . Sinus tachycardia 03/02/2013  . Elevated lactic acid level 03/02/2013  . Influenza due to identified novel influenza A virus with other respiratory manifestations 03/02/2013  . Hyperglycemia 03/02/2013  . Fibromyalgia   . OSA (obstructive sleep apnea) 06/22/2011  . DOE (dyspnea on exertion) 05/30/2011  . Moderate persistent asthma 03/30/2011   Past Medical History:  Diagnosis Date  . Asthma   . COPD (chronic obstructive pulmonary disease) ([place])   . Diabetes mellitus without complication (HCC)    Type 2  . Dizziness   . Fibromyalgia   . GERD (gastroesophageal reflux disease)   . Headache   . Hyperlipidemia   . OSA (obstructive sleep apnea)   . Osteoarthritis   . PCOS (polycystic ovarian syndrome)   . Raynaud disease   . Rheumatoid arthritis ([place])     Family History  Problem Relation Age of Onset  . Asthma Mother   . Rheum arthritis Mother   . Allergic rhinitis Mother   . Rheum arthritis Sister   . Allergic rhinitis Sister   . Heart failure Father   . Heart disease Father   . Rheum arthritis Sister   . Allergic rhinitis Sister   . Allergic rhinitis Brother   . Allergies Other        "Everyone"  . Heart disease Maternal Grandfather   .  Heart disease Maternal Grandmother   . Heart disease Paternal Grandfather   . Heart disease Paternal Grandmother     Past Surgical History:  Procedure Laterality Date  . APPENDECTOMY    . BACK SURGERY  2021  . CHOLECYSTECTOMY    . COLONOSCOPY W/ BIOPSIES  fall 2009  . COMBINED HYSTERECTOMY VAGINAL / OOPHORECTOMY / A&P REPAIR  10/2002   endometriosis, cystocele, fibroids  . KNEE SURGERY  1996  . TOE SURGERY    . VESICOVAGINAL FISTULA CLOSURE W/ TAH     Social History   Occupational History  . Occupation: Patient Account Rep    Employer: [employer]  Tobacco Use  . Smoking status: Never Smoker  . Smokeless tobacco:  Never Used  Vaping Use  . Vaping Use: Never used  Substance and Sexual Activity  . Alcohol use: No  . Drug use: No  . Sexual activity: Not on file

## 2020-04-22 DIAGNOSIS — M0579 Rheumatoid arthritis with rheumatoid factor of multiple sites without organ or systems involvement: Secondary | ICD-10-CM | POA: Diagnosis not present

## 2020-04-22 DIAGNOSIS — J302 Other seasonal allergic rhinitis: Secondary | ICD-10-CM | POA: Diagnosis not present

## 2020-04-22 DIAGNOSIS — I10 Essential (primary) hypertension: Secondary | ICD-10-CM | POA: Diagnosis not present

## 2020-04-22 DIAGNOSIS — E039 Hypothyroidism, unspecified: Secondary | ICD-10-CM | POA: Diagnosis not present

## 2020-04-22 DIAGNOSIS — J45909 Unspecified asthma, uncomplicated: Secondary | ICD-10-CM | POA: Diagnosis not present

## 2020-04-22 DIAGNOSIS — D72829 Elevated white blood cell count, unspecified: Secondary | ICD-10-CM | POA: Diagnosis not present

## 2020-04-22 DIAGNOSIS — E1142 Type 2 diabetes mellitus with diabetic polyneuropathy: Secondary | ICD-10-CM | POA: Diagnosis not present

## 2020-04-22 DIAGNOSIS — G9009 Other idiopathic peripheral autonomic neuropathy: Secondary | ICD-10-CM | POA: Diagnosis not present

## 2020-04-22 DIAGNOSIS — K219 Gastro-esophageal reflux disease without esophagitis: Secondary | ICD-10-CM | POA: Diagnosis not present

## 2020-04-22 DIAGNOSIS — J4520 Mild intermittent asthma, uncomplicated: Secondary | ICD-10-CM | POA: Diagnosis not present

## 2020-04-22 DIAGNOSIS — F331 Major depressive disorder, recurrent, moderate: Secondary | ICD-10-CM | POA: Diagnosis not present

## 2020-04-22 DIAGNOSIS — E782 Mixed hyperlipidemia: Secondary | ICD-10-CM | POA: Diagnosis not present

## 2020-05-04 DIAGNOSIS — I10 Essential (primary) hypertension: Secondary | ICD-10-CM | POA: Diagnosis not present

## 2020-05-04 DIAGNOSIS — E7849 Other hyperlipidemia: Secondary | ICD-10-CM | POA: Diagnosis not present

## 2020-05-04 DIAGNOSIS — E1142 Type 2 diabetes mellitus with diabetic polyneuropathy: Secondary | ICD-10-CM | POA: Diagnosis not present

## 2020-05-05 ENCOUNTER — Other Ambulatory Visit: Payer: Self-pay

## 2020-05-05 ENCOUNTER — Encounter: Payer: Self-pay | Admitting: Orthopaedic Surgery

## 2020-05-05 ENCOUNTER — Ambulatory Visit (INDEPENDENT_AMBULATORY_CARE_PROVIDER_SITE_OTHER): Payer: PPO | Admitting: Physician Assistant

## 2020-05-05 VITALS — Ht 60.0 in | Wt 130.0 lb

## 2020-05-05 DIAGNOSIS — M67442 Ganglion, left hand: Secondary | ICD-10-CM

## 2020-05-05 MED ORDER — CEPHALEXIN 500 MG PO CAPS: 500.0000 mg | ORAL_CAPSULE | Freq: Four times a day (QID) | ORAL | 0 refills | Status: DC

## 2020-05-05 NOTE — Progress Notes (Signed)
Post-Op Visit Note   Patient: Stephanie Norris           Date of Birth: [birth year]           MRN: [id number] Visit Date: 05/05/2020 PCP: Celene Squibb, MD   Assessment & Plan:  Chief Complaint:  Chief Complaint  Patient presents with  . Left Hand - Follow-up    4wk s/p Left index mucous cyst excision    Visit Diagnoses:  1. Mucous cyst of digit of left hand     Plan: Patient is a pleasant 65 year old female who comes in today 4 weeks out left index mucous cyst excision.  She has been doing well.  She still notes slight discomfort to the index finger.  She has been applying mupirocin and a bandage twice daily.  No fevers or chills.  Examination of her left index finger shows mild erythema, drainage, warmth or induration.  She does have slight decrease sensation to the fingertip.  At this point, we have reassured her that this looks just like inflammatory changes.  She will continue with the mupirocin and bandage application twice a day.  I sent in a prescription of Keflex to start prophylactically.  She will follow up with [place] in 4 weeks time for recheck.  Should her symptoms worsen in the meantime she will let us know.  Follow-Up Instructions: Return in about 4 weeks (around 06/02/2020).   Orders:  No orders of the defined types were placed in this encounter.  Meds ordered this encounter  Medications  . cephALEXin (KEFLEX) 500 MG capsule    Sig: Take 1 capsule (500 mg total) by mouth 4 (four) times daily.    Dispense:  28 capsule    Refill:  0    Imaging: No new imaging  PMFS History: Patient Active Problem List   Diagnosis Date Noted  . Mucous cyst of digit of left hand 04/07/2020  . Vertigo 08/20/2016  . Eustachian tube dysfunction 04/17/2016  . Abdominal pain 03/02/2016  . Gastroenteritis 03/02/2016  . Rheumatoid arthritis with rheumatoid factor of multiple sites without organ or systems involvement ([place]) 01/13/2016  . HLA B27 (HLA B27 positive) 01/13/2016  .  Osteoarthritis of foot 01/13/2016  . DJD (degenerative joint disease), cervical 01/13/2016  . Spondylosis of lumbar region without myelopathy or radiculopathy 01/13/2016  . Primary osteoarthritis of both hands 01/13/2016  . Non-seasonal allergic rhinitis 08/13/2014  . Stiffness of joints, not elsewhere classified, multiple sites 08/26/2013  . Flu-like symptoms 03/02/2013  . Lactic acidosis 03/02/2013  . Sinus tachycardia 03/02/2013  . Elevated lactic acid level 03/02/2013  . Influenza due to identified novel influenza A virus with other respiratory manifestations 03/02/2013  . Hyperglycemia 03/02/2013  . Fibromyalgia   . OSA (obstructive sleep apnea) 06/22/2011  . DOE (dyspnea on exertion) 05/30/2011  . Moderate persistent asthma 03/30/2011   Past Medical History:  Diagnosis Date  . Asthma   . COPD (chronic obstructive pulmonary disease) ([place])   . Diabetes mellitus without complication (HCC)    Type 2  . Dizziness   . Fibromyalgia   . GERD (gastroesophageal reflux disease)   . Headache   . Hyperlipidemia   . OSA (obstructive sleep apnea)   . Osteoarthritis   . PCOS (polycystic ovarian syndrome)   . Raynaud disease   . Rheumatoid arthritis ([place])     Family History  Problem Relation Age of Onset  . Asthma Mother   . Rheum arthritis Mother   . Allergic  rhinitis Mother   . Rheum arthritis Sister   . Allergic rhinitis Sister   . Heart failure Father   . Heart disease Father   . Rheum arthritis Sister   . Allergic rhinitis Sister   . Allergic rhinitis Brother   . Allergies Other        "Everyone"  . Heart disease Maternal Grandfather   . Heart disease Maternal Grandmother   . Heart disease Paternal Grandfather   . Heart disease Paternal Grandmother     Past Surgical History:  Procedure Laterality Date  . APPENDECTOMY    . BACK SURGERY  2021  . CHOLECYSTECTOMY    . COLONOSCOPY W/ BIOPSIES  fall 2009  . COMBINED HYSTERECTOMY VAGINAL / OOPHORECTOMY / A&P REPAIR   10/2002   endometriosis, cystocele, fibroids  . KNEE SURGERY  1996  . TOE SURGERY    . VESICOVAGINAL FISTULA CLOSURE W/ TAH     Social History   Occupational History  . Occupation: Patient Account Rep    Employer: [employer]  Tobacco Use  . Smoking status: Never Smoker  . Smokeless tobacco: Never Used  Vaping Use  . Vaping Use: Never used  Substance and Sexual Activity  . Alcohol use: No  . Drug use: No  . Sexual activity: Not on file

## 2020-05-06 DIAGNOSIS — H524 Presbyopia: Secondary | ICD-10-CM | POA: Diagnosis not present

## 2020-05-06 DIAGNOSIS — E11319 Type 2 diabetes mellitus with unspecified diabetic retinopathy without macular edema: Secondary | ICD-10-CM | POA: Diagnosis not present

## 2020-05-09 ENCOUNTER — Other Ambulatory Visit: Payer: Self-pay | Admitting: Physician Assistant

## 2020-05-09 HISTORY — PX: FINGER SURGERY: SHX640

## 2020-06-05 DIAGNOSIS — I10 Essential (primary) hypertension: Secondary | ICD-10-CM | POA: Diagnosis not present

## 2020-06-05 DIAGNOSIS — R0981 Nasal congestion: Secondary | ICD-10-CM | POA: Diagnosis not present

## 2020-06-05 DIAGNOSIS — R059 Cough, unspecified: Secondary | ICD-10-CM | POA: Diagnosis not present

## 2020-06-05 DIAGNOSIS — J029 Acute pharyngitis, unspecified: Secondary | ICD-10-CM | POA: Diagnosis not present

## 2020-06-05 DIAGNOSIS — K219 Gastro-esophageal reflux disease without esophagitis: Secondary | ICD-10-CM | POA: Diagnosis not present

## 2020-06-05 DIAGNOSIS — J069 Acute upper respiratory infection, unspecified: Secondary | ICD-10-CM | POA: Diagnosis not present

## 2020-06-06 DIAGNOSIS — R0981 Nasal congestion: Secondary | ICD-10-CM | POA: Diagnosis not present

## 2020-06-06 DIAGNOSIS — J069 Acute upper respiratory infection, unspecified: Secondary | ICD-10-CM | POA: Diagnosis not present

## 2020-06-06 DIAGNOSIS — R059 Cough, unspecified: Secondary | ICD-10-CM | POA: Diagnosis not present

## 2020-06-06 DIAGNOSIS — J029 Acute pharyngitis, unspecified: Secondary | ICD-10-CM | POA: Diagnosis not present

## 2020-06-07 ENCOUNTER — Ambulatory Visit: Payer: PPO | Admitting: Rheumatology

## 2020-06-07 ENCOUNTER — Other Ambulatory Visit: Payer: Self-pay | Admitting: Family Medicine

## 2020-06-07 NOTE — Telephone Encounter (Signed)
Called and spoke to patient to see if the request for the ipratropium nasal spray was sent in error as in you last note 09/2019 you advised patient to discontinue use of the nasal spray and use azelastine nasal spray. Patient expressed that she still uses the nasal spray as well as the azelastine in combinations as she deems she has better results with the duo and her nasal inhalations are better. Please  advise on this refill. Thank you. Patient has made an appointment with Althea Charon as a follow up regarding this duo.

## 2020-06-15 ENCOUNTER — Ambulatory Visit: Payer: Self-pay | Admitting: Family

## 2020-06-16 DIAGNOSIS — E039 Hypothyroidism, unspecified: Secondary | ICD-10-CM | POA: Diagnosis not present

## 2020-06-23 DIAGNOSIS — E039 Hypothyroidism, unspecified: Secondary | ICD-10-CM | POA: Diagnosis not present

## 2020-06-23 DIAGNOSIS — E119 Type 2 diabetes mellitus without complications: Secondary | ICD-10-CM | POA: Diagnosis not present

## 2020-06-23 DIAGNOSIS — R5383 Other fatigue: Secondary | ICD-10-CM | POA: Diagnosis not present

## 2020-06-23 DIAGNOSIS — N951 Menopausal and female climacteric states: Secondary | ICD-10-CM | POA: Diagnosis not present

## 2020-06-27 NOTE — Patient Instructions (Addendum)
1.  Not well controlled moderate persistent asthma, uncomplicated Stop Breo 035 mcg - Daily controller medication(s): Start Trelegy 100 mcg / 62.5 mcg / 25 mcg 1 puff once a day. 2 samples given. She will try the samples before we send in a prescription. This will replace Breo 200 mcg - Prior to physical activity: albuterol 2 puffs 10-15 minutes before physical activity. - Rescue medications: albuterol 4 puffs every 4-6 hours as needed - Asthma control goals:  * Full participation in all desired activities (may need albuterol before activity) * Albuterol use two time or less a week on average (not counting use with activity) * Cough interfering with sleep two time or less a month * Oral steroids no more than once a year * No hospitalizations  2. Chronic rhinitis -Try stopping the ipratropium bromide nasal spray and using azelastine one spray per nostril up to twice daily.  - Continue with the fluticasone one spray per nostril up to twice daily.  - Continue with Xyzal 5mg  daily.    3. Schedule a follow up appointment in 4 weeks or sooner if needed

## 2020-06-29 ENCOUNTER — Other Ambulatory Visit: Payer: Self-pay

## 2020-06-29 ENCOUNTER — Encounter: Payer: Self-pay | Admitting: Family

## 2020-06-29 ENCOUNTER — Ambulatory Visit (INDEPENDENT_AMBULATORY_CARE_PROVIDER_SITE_OTHER): Payer: PPO | Admitting: Family

## 2020-06-29 VITALS — BP 128/74 | HR 88 | Temp 97.4°F | Resp 16 | Ht 59.0 in | Wt 130.6 lb

## 2020-06-29 DIAGNOSIS — J310 Chronic rhinitis: Secondary | ICD-10-CM

## 2020-06-29 DIAGNOSIS — J4540 Moderate persistent asthma, uncomplicated: Secondary | ICD-10-CM

## 2020-06-29 DIAGNOSIS — J3489 Other specified disorders of nose and nasal sinuses: Secondary | ICD-10-CM | POA: Diagnosis not present

## 2020-06-29 NOTE — Progress Notes (Signed)
[place], [place] [address] Dept: [phone]  FOLLOW UP NOTE  Patient ID: Stephanie Norris, female    DOB: Apr 01, 1955  Age: 65 y.o. MRN: [id number] Date of Office Visit: 06/29/2020  Assessment  Chief Complaint: Asthma  HPI Stephanie Norris is a 65 year old female who presents today for follow-up of moderate persistent asthma, chronic rhinitis, and nasal sore.  She was last seen on September 23, 2019 by Dr. Ernst Bowler.  Moderate persistent asthma is reported as not well controlled with Breo 200/25 mcg 1 puff once a day and albuterol as needed.  She reports that for the past couple months she will occasionally feel like she is breathing heavier and that her heart will feel like it is beating faster.  She will then use albuterol and this helps.  She also reports occasional wheezing, occasional tightness in her chest, and shortness of breath.  She denies any coughing or nocturnal awakenings.  Since her last office visit she has not required any trips to the emergency room or urgent care due to breathing problems.  She has been on 2 rounds of antibiotics and steroids from her primary care physician for sinus infections.  She has used her albuterol inhaler 3 times this past month.  Chronic rhinitis is reported as controlled with ipratropium bromide nasal spray 1 spray each nostril once a day and azelastine 1 spray each nostril once a day.  She reports that this really controls her drippy nose better.  She also continues to take Xyzal 5 mg once a day and she is not using fluticasone nasal spray.  She does mention that she does have a dry mouth.  Discussed with her that the azelastine along with the ipratropium bromide can be contributing to her dry mouth.  She reports drippy clear rhinorrhea and denies nasal congestion and postnasal drip.  She has had 2 sinus infections once we last saw her that were treated by her primary care physician.  Her last sinus infection was in April.  She  reports that she did see ENT for the sore in her right nostril and that she is now using saline nasal gel and the sore is not as bothersome now.   Drug Allergies:  Allergies  Allergen Reactions  . Nsaids Anaphylaxis  . Bactrim [Sulfamethoxazole-Trimethoprim] Nausea Only    Upset stomach  . Cephalexin Rash  . Levofloxacin Rash  . Macrobid [Nitrofurantoin] Rash    Review of Systems: Review of Systems  Constitutional: Negative for chills and fever.  HENT:       Reports drippy clear nose and denies nasal congestion and postnasal drip.  She has had 2 sinus infections since we last saw her.  Eyes:       She reports occasional itchy eyes.  She reports that her left eye will have white crusting in the morning, but is not goopy  Respiratory: Positive for shortness of breath and wheezing. Negative for cough.   Cardiovascular: Negative for chest pain and palpitations.  Gastrointestinal: Negative for heartburn.       Denies heartburn and reflux. Takes pantoprazole  Genitourinary: Negative for dysuria.  Skin: Negative for itching and rash.  Neurological: Positive for headaches.       Reports a headache that occurs every now and then    Physical Exam: BP 128/74 (BP Location: Right Arm, Patient Position: Sitting, Cuff Size: Normal)   Pulse 88   Temp (!) 97.4 F (36.3 C) (Temporal)   Resp 16  Ht 4\' 11"  (1.499 m)   Wt 130 lb 9.6 oz (59.2 kg)   SpO2 95%   BMI 26.38 kg/m    Physical Exam Constitutional:      Appearance: Normal appearance.  HENT:     Head: Normocephalic and atraumatic.     Comments: Pharynx normal, eyes normal, ears normal, nose: Scabbing present in right nostril    Right Ear: Tympanic membrane, ear canal and external ear normal.     Left Ear: Tympanic membrane, ear canal and external ear normal.     Mouth/Throat:     Mouth: Mucous membranes are moist.     Pharynx: Oropharynx is clear.  Eyes:     Conjunctiva/sclera: Conjunctivae normal.  Cardiovascular:     Rate  and Rhythm: Normal rate and regular rhythm.     Pulses: Normal pulses.     Heart sounds: Normal heart sounds.  Pulmonary:     Effort: Pulmonary effort is normal.     Breath sounds: Normal breath sounds.     Comments: Lungs clear to auscultation Musculoskeletal:     Cervical back: Neck supple.  Skin:    General: Skin is warm.  Neurological:     Mental Status: She is alert and oriented to person, place, and time.  Psychiatric:        Mood and Affect: Mood normal.        Behavior: Behavior normal.        Thought Content: Thought content normal.        Judgment: Judgment normal.     Diagnostics: FVC 2.13 L, FEV1 1.58 L.  Predicted FVC 2.45 L, predicted FEV1 1.93 L.  Spirometry indicates normal respiratory function.  Status post bronchodilator response shows FVC 2.32 L, FEV1 1.59 L.  Spirometry indicates 1% change in FEV1 and normal respiratory function.  She reports that she can breathe a bit better after the 4 puffs of Xopenex.  Assessment and Plan: 1. Not well controlled moderate persistent asthma   2. Chronic rhinitis   3. Nasal sore     No orders of the defined types were placed in this encounter.   Patient Instructions  1.  Not well controlled moderate persistent asthma, uncomplicated Stop Breo 889 mcg - Daily controller medication(s): Start Trelegy 100 mcg / 62.5 mcg / 25 mcg 1 puff once a day. 2 samples given. She will try the samples before we send in a prescription. This will replace Breo 200 mcg - Prior to physical activity: albuterol 2 puffs 10-15 minutes before physical activity. - Rescue medications: albuterol 4 puffs every 4-6 hours as needed - Asthma control goals:  * Full participation in all desired activities (may need albuterol before activity) * Albuterol use two time or less a week on average (not counting use with activity) * Cough interfering with sleep two time or less a month * Oral steroids no more than once a year * No hospitalizations  2. Chronic  rhinitis -Try stopping the ipratropium bromide nasal spray and using azelastine one spray per nostril up to twice daily.  - Continue with the fluticasone one spray per nostril up to twice daily.  - Continue with Xyzal 5mg  daily.    3. Schedule a follow up appointment in 4 weeks or sooner if needed            Return in about 4 weeks (around 07/27/2020), or if symptoms worsen or fail to improve.    Thank you for the opportunity to care for this  patient.  Please do not hesitate to contact me with questions.  Althea Charon, FNP [facility]

## 2020-07-12 NOTE — Progress Notes (Signed)
Office Visit Note  Patient: Stephanie Norris             Date of Birth: 02-17-1955           MRN: [id number]             PCP: Celene Squibb, MD Referring: Celene Squibb, MD Visit Date: 07/26/2020 Occupation: @GUAROCC @  Subjective:  Fatigue   History of Present Illness: Stephanie Norris is a 65 y.o. female with history of seropositive rheumatoid arthritis, osteoarthritis, fibromyalgia, and DDD.  She is taking methotrexate 6 tablets by mouth once weekly and folic acid 2 mg by mouth daily.  She is tolerating MTX without any side effects. She has not missed any doses recently.  She denies any recent rheumatoid arthritis flares.  She denies any joint swelling currently.  She continues to have about 3 fibromyalgia  flares per month.  She experiences generalized myalgias and muscle tenderness on a daily basis.  She has intermittent discomfort due to trochanteric bursitis of both hips.  She has not been performing stretching exercises or going to water aerobics lately.  She states has been difficult to get up in the morning due to the fatigue and lack of motivation.  She continues to take cymbalta 60 mg 1 capsule daily.      Activities of Daily Living:  Patient reports morning stiffness for 10 minutes.   Patient Denies nocturnal pain.  Difficulty dressing/grooming: Denies Difficulty climbing stairs: Reports Difficulty getting out of chair: Reports Difficulty using hands for taps, buttons, cutlery, and/or writing: Reports  Review of Systems  Constitutional:  Positive for fatigue.  HENT:  Positive for mouth dryness. Negative for mouth sores and nose dryness.   Eyes:  Negative for pain, itching and dryness.  Respiratory:  Positive for shortness of breath and difficulty breathing.   Cardiovascular:  Positive for palpitations. Negative for chest pain.  Gastrointestinal:  Negative for blood in stool, constipation and diarrhea.  Endocrine: Negative for increased urination.  Genitourinary:  Negative  for difficulty urinating.  Musculoskeletal:  Positive for joint pain, joint pain, joint swelling, myalgias, morning stiffness, muscle tenderness and myalgias.  Skin:  Negative for color change, rash and redness.  Allergic/Immunologic: Negative for susceptible to infections.  Neurological:  Positive for dizziness, numbness, headaches and memory loss.  Hematological:  Positive for bruising/bleeding tendency.  Psychiatric/Behavioral:  Positive for confusion.    PMFS History:  Patient Active Problem List   Diagnosis Date Noted   Mucous cyst of digit of left hand 04/07/2020   Vertigo 08/20/2016   Eustachian tube dysfunction 04/17/2016   Abdominal pain 03/02/2016   Gastroenteritis 03/02/2016   Rheumatoid arthritis with rheumatoid factor of multiple sites without organ or systems involvement ([place]) 01/13/2016   HLA B27 (HLA B27 positive) 01/13/2016   Osteoarthritis of foot 01/13/2016   DJD (degenerative joint disease), cervical 01/13/2016   Spondylosis of lumbar region without myelopathy or radiculopathy 01/13/2016   Primary osteoarthritis of both hands 01/13/2016   Non-seasonal allergic rhinitis 08/13/2014   Stiffness of joints, not elsewhere classified, multiple sites 08/26/2013   Flu-like symptoms 03/02/2013   Lactic acidosis 03/02/2013   Sinus tachycardia 03/02/2013   Elevated lactic acid level 03/02/2013   Influenza due to identified novel influenza A virus with other respiratory manifestations 03/02/2013   Hyperglycemia 03/02/2013   Fibromyalgia    OSA (obstructive sleep apnea) 06/22/2011   DOE (dyspnea on exertion) 05/30/2011   Moderate persistent asthma 03/30/2011    Past Medical History:  Diagnosis Date   Asthma    COPD (chronic obstructive pulmonary disease) (HCC)    Diabetes mellitus without complication (HCC)    Type 2   Dizziness    Fibromyalgia    GERD (gastroesophageal reflux disease)    Headache    Hyperlipidemia    OSA (obstructive sleep apnea)     Osteoarthritis    PCOS (polycystic ovarian syndrome)    Raynaud disease    Rheumatoid arthritis ([place])     Family History  Problem Relation Age of Onset   Asthma Mother    Rheum arthritis Mother    Allergic rhinitis Mother    Rheum arthritis Sister    Allergic rhinitis Sister    Heart failure Father    Heart disease Father    Rheum arthritis Sister    Allergic rhinitis Sister    Allergic rhinitis Brother    Allergies Other        "Everyone"   Heart disease Maternal Grandfather    Heart disease Maternal Grandmother    Heart disease Paternal Grandfather    Heart disease Paternal Grandmother    Past Surgical History:  Procedure Laterality Date   APPENDECTOMY     BACK SURGERY  2021   CHOLECYSTECTOMY     COLONOSCOPY W/ BIOPSIES  fall 2009   COMBINED HYSTERECTOMY VAGINAL / OOPHORECTOMY / A&P REPAIR  10/2002   endometriosis, cystocele, fibroids   FINGER SURGERY Left 05/09/2020   spurs removed from left index finger   KNEE SURGERY  1996   TOE SURGERY     VESICOVAGINAL FISTULA CLOSURE W/ TAH     Social History   Social History Narrative   Lives with husband   Retired    no children   Assoc degree   16 oz caffeine daily   Immunization History  Administered Date(s) Administered   Influenza Whole 10/07/2010, 11/06/2011   Influenza,inj,Quad PF,6+ Mos 11/05/2012, 11/02/2015, 11/22/2016, 11/15/2017   Influenza-Unspecified 11/05/2013   Moderna Sars-Covid-2 Vaccination 04/13/2019, 05/11/2019   Pneumococcal Conjugate-13 11/06/2014   Pneumococcal Polysaccharide-23 11/02/2015     Objective: Vital Signs: BP (!) 145/88 (BP Location: Left Arm, Patient Position: Sitting, Cuff Size: Normal)   Pulse 93   Ht 4' 11.75" (1.518 m)   Wt 129 lb (58.5 kg)   BMI 25.40 kg/m    Physical Exam Vitals and nursing note reviewed.  Constitutional:      Appearance: She is well-developed.  HENT:     Head: Normocephalic and atraumatic.  Eyes:     Conjunctiva/sclera: Conjunctivae normal.   Pulmonary:     Effort: Pulmonary effort is normal.  Abdominal:     Palpations: Abdomen is soft.  Musculoskeletal:     Cervical back: Normal range of motion.  Skin:    General: Skin is warm and dry.     Capillary Refill: Capillary refill takes less than 2 seconds.  Neurological:     Mental Status: She is alert and oriented to person, place, and time.  Psychiatric:        Behavior: Behavior normal.     Musculoskeletal Exam: C-spine good ROM.  Trapezius muscle tension and tenderness bilaterally.  Painful ROM of lumbar spine.  Midline spinal tenderness in lumbar region.  Shoulder joints, elbow joints, wrist joints, MCPs, PIPs, and DIPs good ROM with no synovitis. PIP and DIP prominence consistent with OA of both hands.  Complete fist formation bilaterally.  Hip joints, knee joints, and ankle joints good ROM with no discomfort.  No warmth or effusion  of knee joints.  No tenderness or inflammation of ankle joints.  Pitting edema bilateral LE, right > left.  PIP and DIP thickening consistent with OA of both feet.  Tenderness over bilateral trochanteric bursa.  CDAI Exam: CDAI Score: 0.6  Patient Global: 3 mm; Provider Global: 3 mm Swollen: 0 ; Tender: 0  Joint Exam 07/26/2020   No joint exam has been documented for this visit   There is currently no information documented on the homunculus. Go to the Rheumatology activity and complete the homunculus joint exam.  Investigation: No additional findings.  Imaging: No results found.  Recent Labs: Lab Results  Component Value Date   WBC 9.6 01/08/2020   HGB 12.0 01/08/2020   PLT 322 01/08/2020   NA 136 01/08/2020   K 4.5 01/08/2020   CL 105 01/08/2020   CO2 22 01/08/2020   GLUCOSE 199 (H) 01/08/2020   BUN 23 01/08/2020   CREATININE 0.66 01/08/2020   BILITOT 0.2 01/08/2020   ALKPHOS 54 09/10/2016   AST 12 01/08/2020   ALT 17 01/08/2020   PROT 6.1 01/08/2020   ALBUMIN 4.0 09/10/2016   CALCIUM 9.6 01/08/2020   GFRAA 107  01/08/2020    Speciality Comments: No specialty comments available.  Procedures:  No procedures performed Allergies: Nsaids, Bactrim [sulfamethoxazole-trimethoprim], Cephalexin, Levofloxacin, and Macrobid [nitrofurantoin]   Assessment / Plan:     Visit Diagnoses: Rheumatoid arthritis with rheumatoid factor of multiple sites without organ or systems involvement ([place]) - +CCP: She has no joint tenderness or synovitis on exam.  She has not had any recent rheumatoid arthritis flares.  She is clinically doing well taking methotrexate 6 tablets by mouth once weekly and folic acid 2 mg by mouth daily.  She has not missed any doses of methotrexate and has been tolerating without any side effects.  She will remain on the current treatment regimen.  She does not need a refill at this time.  She was advised to notify us if she develops increased joint pain or joint swelling.  She will follow-up in the office in 5 months.  High risk medication use - Methotrexate 6 tablets by mouth every 7 days and folic acid 1 mg 2 tablets daily. CBC and CMP updated on 01/08/20.  She is overdue to update lab work.  Orders for CBC and CMP are in place.  Her next lab work will be due in September and every 3 months.  Standing orders for CBC and CMP are in place.  - Plan: COMPLETE METABOLIC PANEL WITH GFR, CBC with Differential/Platelet Discussed the importance of holding MTX if she develops signs or symptoms of an infection and to resume once the infection has completely cleared.     HLA B27 (HLA B27 positive)  Primary osteoarthritis of both hands: She has PIP and DIP prominence consistent with osteoarthritis of both hands.  No joint tenderness or inflammation was noted.  Discussed the importance of joint protection and muscle strengthening.   Primary osteoarthritis of both feet: She has PIP and DIP thickening consistent with osteoarthritis of both feet.  She experiences chronic discomfort and stiffness in both feet.  We  discussed the importance of wearing proper fitting shoes.  Fibromyalgia: She has generalized hyperalgesia and positive tender points on exam.  Trapezius muscle tension and tenderness noted bilaterally.  She has tenderness palpation over bilateral trochanteric bursa.  She has been experiencing about 3 flares a month.  She was strongly encouraged to return to water aerobics.  We also discussed  a referral to water therapy but she declined at this time.  She is strongly encouraged to perform home exercises on a daily basis.  She has been experiencing increased fatigue on a daily basis and has difficulty getting out of bed in the morning.  She has been sleeping well at night overall and seems to be oversleeping throughout the day.  We discussed the importance of good sleep hygiene.  She was strongly encouraged to go to sleep and wake up at the same time every day.   Trapezius muscle spasm -She has trapezius muscle tension and tenderness bilaterally.  She had trigger point injections performed on 01/08/2020.    Trochanteric bursitis of both hips: She has ongoing tenderness to palpation over bilateral trochanteric bursa. She had a cortisone injection performed for left trochanteric bursitis on 03/04/20.  Discussed the importance of performing daily exercises.   DDD (degenerative disc disease), cervical: She has slightly limited range of motion with some discomfort.  Trapezius muscle tension and tenderness bilaterally.  DDD (degenerative disc disease), lumbar - Discectomy performed April 2021 by Dr. Arnoldo Morale.  Chronic pain.  She has midline spinal tenderness in the lumbar region.  She uses Lidoderm patches as needed for pain relief.  Chronic SI joint pain - A cortisone injection was performed on 08/07/19.   Other fatigue: She has been experiencing significant fatigue on a daily basis.  According to the patient she lacks motivation in the morning getting out of bed.  She has been sleeping well at night and often  takes a nap during the day.  We discussed the importance of good sleep hygiene.  She was strongly encouraged to go to sleep and wake up at the same time every day.  We also discussed the importance of regular exercise. Some of the fatigue may be related to underlying depression.  She will remain on Cymbalta 60 mg 1 capsule by mouth daily.  History of depression: She will remain on Cymbalta 60 mg 1 capsule by mouth daily.  Other medical conditions are listed as follows:  History of sleep apnea  History of asthma  History of diabetes mellitus  Orders: Orders Placed This Encounter  Procedures   COMPLETE METABOLIC PANEL WITH GFR   CBC with Differential/Platelet   No orders of the defined types were placed in this encounter.    Follow-Up Instructions: Return in about 5 months (around 12/26/2020) for Rheumatoid arthritis, Osteoarthritis, DDD, Fibromyalgia.   Ofilia Neas, PA-C  Note - This record has been created using Dragon software.  Chart creation errors have been sought, but may not always  have been located. Such creation errors do not reflect on  the standard of medical care.

## 2020-07-20 DIAGNOSIS — Z20822 Contact with and (suspected) exposure to covid-19: Secondary | ICD-10-CM | POA: Diagnosis not present

## 2020-07-20 DIAGNOSIS — R051 Acute cough: Secondary | ICD-10-CM | POA: Diagnosis not present

## 2020-07-20 DIAGNOSIS — J0100 Acute maxillary sinusitis, unspecified: Secondary | ICD-10-CM | POA: Diagnosis not present

## 2020-07-20 DIAGNOSIS — J449 Chronic obstructive pulmonary disease, unspecified: Secondary | ICD-10-CM | POA: Diagnosis not present

## 2020-07-21 ENCOUNTER — Telehealth: Payer: Self-pay

## 2020-07-21 ENCOUNTER — Other Ambulatory Visit: Payer: Self-pay | Admitting: *Deleted

## 2020-07-21 DIAGNOSIS — Z1589 Genetic susceptibility to other disease: Secondary | ICD-10-CM

## 2020-07-21 DIAGNOSIS — M0579 Rheumatoid arthritis with rheumatoid factor of multiple sites without organ or systems involvement: Secondary | ICD-10-CM

## 2020-07-21 DIAGNOSIS — Z79899 Other long term (current) drug therapy: Secondary | ICD-10-CM

## 2020-07-21 NOTE — Telephone Encounter (Signed)
Patient requested her labwork orders be faxed to her PCP Dr. Shepard [facility] office.  Patient states she will be going on Monday, 07/25/20.  Patient states they use [facility].  Fax [phone]

## 2020-07-21 NOTE — Telephone Encounter (Signed)
Faxed, order in Andrea's drawer if needed

## 2020-07-26 ENCOUNTER — Encounter: Payer: Self-pay | Admitting: Physician Assistant

## 2020-07-26 ENCOUNTER — Other Ambulatory Visit: Payer: Self-pay

## 2020-07-26 ENCOUNTER — Ambulatory Visit (INDEPENDENT_AMBULATORY_CARE_PROVIDER_SITE_OTHER): Payer: PPO | Admitting: Physician Assistant

## 2020-07-26 VITALS — BP 145/88 | HR 93 | Ht 59.75 in | Wt 129.0 lb

## 2020-07-26 DIAGNOSIS — M19071 Primary osteoarthritis, right ankle and foot: Secondary | ICD-10-CM | POA: Diagnosis not present

## 2020-07-26 DIAGNOSIS — M797 Fibromyalgia: Secondary | ICD-10-CM | POA: Diagnosis not present

## 2020-07-26 DIAGNOSIS — M5136 Other intervertebral disc degeneration, lumbar region: Secondary | ICD-10-CM

## 2020-07-26 DIAGNOSIS — M0579 Rheumatoid arthritis with rheumatoid factor of multiple sites without organ or systems involvement: Secondary | ICD-10-CM | POA: Diagnosis not present

## 2020-07-26 DIAGNOSIS — M7061 Trochanteric bursitis, right hip: Secondary | ICD-10-CM | POA: Diagnosis not present

## 2020-07-26 DIAGNOSIS — Z79899 Other long term (current) drug therapy: Secondary | ICD-10-CM | POA: Diagnosis not present

## 2020-07-26 DIAGNOSIS — Z8639 Personal history of other endocrine, nutritional and metabolic disease: Secondary | ICD-10-CM

## 2020-07-26 DIAGNOSIS — G8929 Other chronic pain: Secondary | ICD-10-CM

## 2020-07-26 DIAGNOSIS — M7062 Trochanteric bursitis, left hip: Secondary | ICD-10-CM

## 2020-07-26 DIAGNOSIS — Z8659 Personal history of other mental and behavioral disorders: Secondary | ICD-10-CM

## 2020-07-26 DIAGNOSIS — M62838 Other muscle spasm: Secondary | ICD-10-CM | POA: Diagnosis not present

## 2020-07-26 DIAGNOSIS — M533 Sacrococcygeal disorders, not elsewhere classified: Secondary | ICD-10-CM

## 2020-07-26 DIAGNOSIS — M5030 Other cervical disc degeneration, unspecified cervical region: Secondary | ICD-10-CM

## 2020-07-26 DIAGNOSIS — Z1589 Genetic susceptibility to other disease: Secondary | ICD-10-CM

## 2020-07-26 DIAGNOSIS — R5383 Other fatigue: Secondary | ICD-10-CM

## 2020-07-26 DIAGNOSIS — M19072 Primary osteoarthritis, left ankle and foot: Secondary | ICD-10-CM

## 2020-07-26 DIAGNOSIS — Z8709 Personal history of other diseases of the respiratory system: Secondary | ICD-10-CM

## 2020-07-26 DIAGNOSIS — M19041 Primary osteoarthritis, right hand: Secondary | ICD-10-CM | POA: Diagnosis not present

## 2020-07-26 DIAGNOSIS — M19042 Primary osteoarthritis, left hand: Secondary | ICD-10-CM

## 2020-07-26 DIAGNOSIS — Z8669 Personal history of other diseases of the nervous system and sense organs: Secondary | ICD-10-CM

## 2020-07-26 LAB — COMPLETE METABOLIC PANEL WITH GFR
AG Ratio: 1.9 (calc) (ref 1.0–2.5)
ALT: 19 U/L (ref 6–29)
AST: 14 U/L (ref 10–35)
Albumin: 4.4 g/dL (ref 3.6–5.1)
Alkaline phosphatase (APISO): 63 U/L (ref 37–153)
BUN/Creatinine Ratio: 39 (calc) — ABNORMAL HIGH (ref 6–22)
BUN: 29 mg/dL — ABNORMAL HIGH (ref 7–25)
CO2: 20 mmol/L (ref 20–32)
Calcium: 10.1 mg/dL (ref 8.6–10.4)
Chloride: 102 mmol/L (ref 98–110)
Creat: 0.75 mg/dL (ref 0.50–0.99)
GFR, Est African American: 96 mL/min/{1.73_m2} (ref >=60)
GFR, Est Non African American: 82 mL/min/{1.73_m2} (ref >=60)
Globulin: 2.3 g/dL (calc) (ref 1.9–3.7)
Glucose, Bld: 220 mg/dL — ABNORMAL HIGH (ref 65–99)
Potassium: 4.6 mmol/L (ref 3.5–5.3)
Sodium: 136 mmol/L (ref 135–146)
Total Bilirubin: 0.2 mg/dL (ref 0.2–1.2)
Total Protein: 6.7 g/dL (ref 6.1–8.1)

## 2020-07-26 LAB — CBC WITH DIFFERENTIAL/PLATELET
Absolute Monocytes: 619 cells/uL (ref 200–950)
Basophils Absolute: 71 cells/uL (ref 0–200)
Basophils Relative: 0.6 %
Eosinophils Absolute: 286 cells/uL (ref 15–500)
Eosinophils Relative: 2.4 %
HCT: 37.8 % (ref 35.0–45.0)
Hemoglobin: 12.6 g/dL (ref 11.7–15.5)
Lymphs Abs: 3249 cells/uL (ref 850–3900)
MCH: 29 pg (ref 27.0–33.0)
MCHC: 33.3 g/dL (ref 32.0–36.0)
MCV: 86.9 fL (ref 80.0–100.0)
MPV: 11.8 fL (ref 7.5–12.5)
Monocytes Relative: 5.2 %
Neutro Abs: 7676 cells/uL (ref 1500–7800)
Neutrophils Relative %: 64.5 %
Platelets: 412 10*3/uL — ABNORMAL HIGH (ref 140–400)
RBC: 4.35 10*6/uL (ref 3.80–5.10)
RDW: 15.1 % — ABNORMAL HIGH (ref 11.0–15.0)
Total Lymphocyte: 27.3 %
WBC: 11.9 10*3/uL — ABNORMAL HIGH (ref 3.8–10.8)

## 2020-07-26 NOTE — Patient Instructions (Signed)
Standing Labs We placed an order today for your standing lab work.   Please have your standing labs drawn in September and every 3 months  If possible, please have your labs drawn 2 weeks prior to your appointment so that the provider can discuss your results at your appointment.  Please note that you may see your imaging and lab results in MyChart before we have reviewed them. We may be awaiting multiple results to interpret others before contacting you. Please allow our office up to 72 hours to thoroughly review all of the results before contacting the office for clarification of your results.  We have open lab daily: Monday through Thursday from 1:30-4:30 PM and Friday from 1:30-4:00 PM at the office of Dr. Shaili Deveshwar, [facility].   Please be advised, all patients with office appointments requiring lab work will take precedent over walk-in lab work.  If possible, please come for your lab work on Monday and Friday afternoons, as you may experience shorter wait times. The office is located at [address], [address], [place], [place] [address] No appointment is necessary.   Labs are drawn by [facility]. Please bring your co-pay at the time of your lab draw.  You may receive a bill from [facility] for your lab work.  If you wish to have your labs drawn at another location, please call the office 24 hours in advance to send orders.  If you have any questions regarding directions or hours of operation,  please call [phone].   As a reminder, please drink plenty of water prior to coming for your lab work. Thanks! 

## 2020-07-27 NOTE — Progress Notes (Signed)
WBC count and plt count borderline elevated but stable.  Glucose is elevated-220.  BUN remains elevated. Rest of CMP WNL.  We will continue to monitor.

## 2020-08-01 DIAGNOSIS — E782 Mixed hyperlipidemia: Secondary | ICD-10-CM | POA: Diagnosis not present

## 2020-08-01 DIAGNOSIS — M0579 Rheumatoid arthritis with rheumatoid factor of multiple sites without organ or systems involvement: Secondary | ICD-10-CM | POA: Diagnosis not present

## 2020-08-01 DIAGNOSIS — J302 Other seasonal allergic rhinitis: Secondary | ICD-10-CM | POA: Diagnosis not present

## 2020-08-01 DIAGNOSIS — G4733 Obstructive sleep apnea (adult) (pediatric): Secondary | ICD-10-CM | POA: Diagnosis not present

## 2020-08-01 DIAGNOSIS — E1142 Type 2 diabetes mellitus with diabetic polyneuropathy: Secondary | ICD-10-CM | POA: Diagnosis not present

## 2020-08-01 DIAGNOSIS — G9009 Other idiopathic peripheral autonomic neuropathy: Secondary | ICD-10-CM | POA: Diagnosis not present

## 2020-08-01 DIAGNOSIS — I10 Essential (primary) hypertension: Secondary | ICD-10-CM | POA: Diagnosis not present

## 2020-08-01 DIAGNOSIS — F331 Major depressive disorder, recurrent, moderate: Secondary | ICD-10-CM | POA: Diagnosis not present

## 2020-08-01 DIAGNOSIS — J4540 Moderate persistent asthma, uncomplicated: Secondary | ICD-10-CM | POA: Diagnosis not present

## 2020-08-01 DIAGNOSIS — K219 Gastro-esophageal reflux disease without esophagitis: Secondary | ICD-10-CM | POA: Diagnosis not present

## 2020-08-02 ENCOUNTER — Ambulatory Visit: Payer: PPO | Admitting: Physician Assistant

## 2020-08-03 DIAGNOSIS — E119 Type 2 diabetes mellitus without complications: Secondary | ICD-10-CM | POA: Diagnosis not present

## 2020-08-03 DIAGNOSIS — E782 Mixed hyperlipidemia: Secondary | ICD-10-CM | POA: Diagnosis not present

## 2020-08-04 DIAGNOSIS — I10 Essential (primary) hypertension: Secondary | ICD-10-CM | POA: Diagnosis not present

## 2020-08-04 DIAGNOSIS — K219 Gastro-esophageal reflux disease without esophagitis: Secondary | ICD-10-CM | POA: Diagnosis not present

## 2020-08-10 ENCOUNTER — Ambulatory Visit: Payer: Self-pay | Admitting: Allergy & Immunology

## 2020-08-13 ENCOUNTER — Other Ambulatory Visit: Payer: Self-pay

## 2020-08-13 ENCOUNTER — Ambulatory Visit
Admission: EM | Admit: 2020-08-13 | Discharge: 2020-08-13 | Disposition: A | Discharge status: Home / Self Care | Payer: PPO | Attending: Family Medicine | Admitting: Family Medicine

## 2020-08-13 ENCOUNTER — Encounter: Payer: Self-pay | Admitting: Emergency Medicine

## 2020-08-13 DIAGNOSIS — L089 Local infection of the skin and subcutaneous tissue, unspecified: Secondary | ICD-10-CM | POA: Diagnosis not present

## 2020-08-13 MED ORDER — DOXYCYCLINE HYCLATE 100 MG PO CAPS: 100.0000 mg | ORAL_CAPSULE | Freq: Two times a day (BID) | ORAL | 0 refills | Status: AC

## 2020-08-13 NOTE — Discharge Instructions (Addendum)
Complete entire course of antibiotics. Avoid wearing tight garments and or change any garments that are wet to avoid moisture that may delay wound healing. If bump remains present or continues to drain following completion of antibiotics, return for reevaluation.

## 2020-08-13 NOTE — ED Provider Notes (Signed)
[facility]    CSN: [id number] Arrival date & time: 08/13/20  1105      History   Chief Complaint No chief complaint on file.   HPI Stephanie Norris is a 65 y.o. female.   HPI Patient presents today for evaluation of a papule she noted at the left distal labia majora x2 days ago.  She reports that it has oozed some clear drainage.  She is a diabetic and recently started Jardiance which can cause skin irritation therefore she presents for evaluation. Past Medical History:  Diagnosis Date   Asthma    COPD (chronic obstructive pulmonary disease) ([place])    Diabetes mellitus without complication (HCC)    Type 2   Dizziness    Fibromyalgia    GERD (gastroesophageal reflux disease)    Headache    Hyperlipidemia    OSA (obstructive sleep apnea)    Osteoarthritis    PCOS (polycystic ovarian syndrome)    Raynaud disease    Rheumatoid arthritis ([place])     Patient Active Problem List   Diagnosis Date Noted   Mucous cyst of digit of left hand 04/07/2020   Vertigo 08/20/2016   Eustachian tube dysfunction 04/17/2016   Abdominal pain 03/02/2016   Gastroenteritis 03/02/2016   Rheumatoid arthritis with rheumatoid factor of multiple sites without organ or systems involvement ([place]) 01/13/2016   HLA B27 (HLA B27 positive) 01/13/2016   Osteoarthritis of foot 01/13/2016   DJD (degenerative joint disease), cervical 01/13/2016   Spondylosis of lumbar region without myelopathy or radiculopathy 01/13/2016   Primary osteoarthritis of both hands 01/13/2016   Non-seasonal allergic rhinitis 08/13/2014   Stiffness of joints, not elsewhere classified, multiple sites 08/26/2013   Flu-like symptoms 03/02/2013   Lactic acidosis 03/02/2013   Sinus tachycardia 03/02/2013   Elevated lactic acid level 03/02/2013   Influenza due to identified novel influenza A virus with other respiratory manifestations 03/02/2013   Hyperglycemia 03/02/2013   Fibromyalgia    OSA (obstructive sleep apnea)  06/22/2011   DOE (dyspnea on exertion) 05/30/2011   Moderate persistent asthma 03/30/2011    Past Surgical History:  Procedure Laterality Date   APPENDECTOMY     BACK SURGERY  2021   CHOLECYSTECTOMY     COLONOSCOPY W/ BIOPSIES  fall 2009   COMBINED HYSTERECTOMY VAGINAL / OOPHORECTOMY / A&P REPAIR  10/2002   endometriosis, cystocele, fibroids   FINGER SURGERY Left 05/09/2020   spurs removed from left index finger   KNEE SURGERY  1996   TOE SURGERY     VESICOVAGINAL FISTULA CLOSURE W/ TAH      OB History     Gravida  1   Para      Term      Preterm      AB  1   Living  0      SAB      IAB      Ectopic      Multiple      Live Births               Home Medications    Prior to Admission medications   Medication Sig Start Date End Date Taking? Authorizing Provider  doxycycline (VIBRAMYCIN) 100 MG capsule Take 1 capsule (100 mg total) by mouth 2 (two) times daily for 7 days. 08/13/20 08/20/20 Yes Scot Jun, FNP  albuterol (PROVENTIL HFA;VENTOLIN HFA) 108 (90 Base) MCG/ACT inhaler Inhale 2 puffs into the lungs every 4 (four) hours as needed for wheezing  or shortness of breath. 10/15/17   Bobbitt, Sedalia Muta, MD  ARMOUR THYROID 15 MG tablet Take 15 mg by mouth daily. Patient not taking: Reported on 07/26/2020 06/24/20   [provider]  azelastine (ASTELIN) 0.1 % nasal spray Place 1 spray into both nostrils 2 (two) times daily. 09/30/19   Valentina Shaggy, MD  cephALEXin (KEFLEX) 500 MG capsule Take 1 capsule (500 mg total) by mouth 4 (four) times daily. Patient not taking: Reported on 07/26/2020 05/05/20   Aundra Dubin, PA-C  Cholecalciferol (VITAMIN D3 PO) Take by mouth daily.    [provider]  Continuous Blood Gluc Sensor ([place]) MISC as needed. 02/12/17   [provider]  Cyanocobalamin (VITAMIN B-12 PO) Take by mouth daily.    [provider]  diclofenac sodium (VOLTAREN) 1 % GEL Apply  3 grams to three large joints up to three times daily as needed 02/21/17   Ofilia Neas, PA-C  DULoxetine (CYMBALTA) 60 MG capsule TAKE 1 CAPSULE BY MOUTH EVERY DAY 09/06/17   Bo Merino, MD  fluticasone furoate-vilanterol (BREO ELLIPTA) 200-25 MCG/INH AEPB Inhale 1 puff into the lungs daily. Patient not taking: Reported on 07/26/2020 10/15/17   Bobbitt, Sedalia Muta, MD  Fluticasone-Umeclidin-Vilant (TRELEGY ELLIPTA IN) Inhale into the lungs daily.    [provider]  glipiZIDE (GLUCOTROL) 5 MG tablet Take 5 mg by mouth daily. 12/17/19   [provider]  ipratropium (ATROVENT) 0.06 % nasal spray USE 2 SPRAYS IN EACH NOSTRIL THREE TIMES DAILY 06/09/20   Valentina Shaggy, MD  lidocaine (LIDODERM) 5 % APPLY 1 PATCH ON THE SKIN EVERY DAY. REMOVE AND DISCARD PATCH WITHIN 12 HOURS OR AS DIRECTED BY MD 10/13/19   Bo Merino, MD  liothyronine (CYTOMEL) 5 MCG tablet Take 5 mcg by mouth daily. 01/25/20   [provider]  metFORMIN (GLUCOPHAGE-XR) 500 MG 24 hr tablet Take 1,000 mg by mouth 2 (two) times daily.  01/04/16   [provider]  methotrexate (RHEUMATREX) 2.5 MG tablet TAKE 6 TABLETS BY MOUTH 1 TIME A WEEK 04/04/20   Ofilia Neas, PA-C  montelukast (SINGULAIR) 10 MG tablet Take 1 tablet (10 mg total) by mouth at bedtime. 10/15/17   Bobbitt, Sedalia Muta, MD  Multiple Vitamins-Minerals (MULTIVITAMIN PO) Take by mouth. Patient not taking: Reported on 07/26/2020    [provider]  omega-3 acid ethyl esters (LOVAZA) 1 g capsule Take 2 capsules by mouth 2 (two) times daily. 03/11/18   [provider]  ONETOUCH VERIO test strip  01/04/16   [provider]  pantoprazole (PROTONIX) 40 MG tablet Take 1 tablet by mouth 2 (two) times daily.  07/14/14   [provider]  progesterone (PROMETRIUM) 100 MG capsule Take 100 mg by mouth at bedtime. 02/22/20   [provider]  valACYclovir (VALTREX) 1000 MG tablet SMARTSIG:2  Tablet(s) By Mouth Every 12 Hours PRN 11/12/18   [provider]    Family History Family History  Problem Relation Age of Onset   Asthma Mother    Rheum arthritis Mother    Allergic rhinitis Mother    Rheum arthritis Sister    Allergic rhinitis Sister    Heart failure Father    Heart disease Father    Rheum arthritis Sister    Allergic rhinitis Sister    Allergic rhinitis Brother    Allergies Other        "Everyone"   Heart disease Maternal Grandfather  Heart disease Maternal Grandmother    Heart disease Paternal Grandfather    Heart disease Paternal Grandmother     Social History Social History   Tobacco Use   Smoking status: Never   Smokeless tobacco: Never  Vaping Use   Vaping Use: Never used  Substance Use Topics   Alcohol use: No   Drug use: No     Allergies   Nsaids, Bactrim [sulfamethoxazole-trimethoprim], Cephalexin, Levofloxacin, and Macrobid [nitrofurantoin]   Review of Systems Review of Systems Pertinent negatives listed in HPI  Physical Exam Triage Vital Signs ED Triage Vitals  Enc Vitals Group     BP 08/13/20 1113 (!) 155/81     Pulse Rate 08/13/20 1113 (!) 102     Resp 08/13/20 1113 16     Temp 08/13/20 1113 98 F (36.7 C)     Temp Source 08/13/20 1113 Oral     SpO2 08/13/20 1113 95 %     Weight --      Height --      Head Circumference --      Peak Flow --      Pain Score 08/13/20 1115 1     Pain Loc --      Pain Edu? --      Excl. in [place]? --    No data found.  Updated Vital Signs BP (!) 155/81 (BP Location: Right Arm)   Pulse (!) 102   Temp 98 F (36.7 C) (Oral)   Resp 16   SpO2 95%   Visual Acuity Right Eye Distance:   Left Eye Distance:   Bilateral Distance:    Right Eye Near:   Left Eye Near:    Bilateral Near:     Physical Exam Exam conducted with a chaperone present.  HENT:     Head: Normocephalic.  Cardiovascular:     Rate and Rhythm: Regular rhythm. Tachycardia present.  Pulmonary:     Effort:  Pulmonary effort is normal.     Breath sounds: Normal breath sounds.  Genitourinary:    Labia:        Left: Lesion present.        Comments: 3 mm, mild erythema, and clear drainage  Neurological:     Mental Status: She is alert.     UC Treatments / Results  Labs (all labs ordered are listed, but only abnormal results are displayed) Labs Reviewed - No data to display  EKG   Radiology No results found.  Procedures Procedures (including critical care time)  Medications Ordered in UC Medications - No data to display  Initial Impression / Assessment and Plan / UC Course  I have reviewed the triage vital signs and the nursing notes.  Pertinent labs & imaging results that were available during my care of the patient were reviewed by me and considered in my medical decision making (see chart for details).     Infected skin lesion.  Doxycyline BID x 10 days. Monitor for delayed wound healing, if no improvement return for evaluation. Final Clinical Impressions(s) / UC Diagnoses   Final diagnoses:  Infected skin lesion, left vagina labia     Discharge Instructions      Complete entire course of antibiotics. Avoid wearing tight garments and or change any garments that are wet to avoid moisture that may delay wound healing. If bump remains present or continues to drain following completion of antibiotics, return for reevaluation.       ED Prescriptions     Medication  Sig Dispense Auth. Provider   doxycycline (VIBRAMYCIN) 100 MG capsule Take 1 capsule (100 mg total) by mouth 2 (two) times daily for 7 days. 14 capsule Scot Jun, FNP      PDMP not reviewed this encounter.   Scot Jun, FNP 08/18/20 [phone]

## 2020-08-13 NOTE — ED Triage Notes (Signed)
States she noticed a "pimple" on her vagina on Thursday.  States the area started out itchy and now hurts and now it is oozing clear drainage.

## 2020-08-22 ENCOUNTER — Other Ambulatory Visit: Payer: Self-pay | Admitting: Physician Assistant

## 2020-08-23 NOTE — Telephone Encounter (Signed)
Next Visit: 01/03/2021  Last Visit: 07/26/2020  Last Fill: 04/04/2020  DX: Rheumatoid arthritis with rheumatoid factor of multiple sites without organ or systems involvement  Current Dose per office note 07/26/2020: Methotrexate 6 tablets by mouth every 7 days  Labs: 07/26/2020 WBC count and plt count borderline elevated but stable.  Glucose iselevated-220.  BUN remains elevated. Rest of CMP WNL.  Okay to refill MTX?

## 2020-08-29 DIAGNOSIS — R5383 Other fatigue: Secondary | ICD-10-CM | POA: Diagnosis not present

## 2020-08-29 DIAGNOSIS — L659 Nonscarring hair loss, unspecified: Secondary | ICD-10-CM | POA: Diagnosis not present

## 2020-08-29 DIAGNOSIS — Z6825 Body mass index (BMI) 25.0-25.9, adult: Secondary | ICD-10-CM | POA: Diagnosis not present

## 2020-08-29 DIAGNOSIS — N951 Menopausal and female climacteric states: Secondary | ICD-10-CM | POA: Diagnosis not present

## 2020-08-29 DIAGNOSIS — E039 Hypothyroidism, unspecified: Secondary | ICD-10-CM | POA: Diagnosis not present

## 2020-08-30 NOTE — Patient Instructions (Addendum)
1.  Not well controlled moderate persistent asthma, uncomplicated  - Daily controller medication(s): Continue Breztri,but increase to 2 puffs twice a day with spacer to help prevent cough and wheeze. Prescription sent for spacer. Demonstration given on how to properly use spacer. Also continue Singulair (montelukast) 10 mg once a day - Prior to physical activity: albuterol 2 puffs 10-15 minutes before physical activity. - Rescue medications: albuterol 4 puffs every 4-6 hours as needed - Asthma control goals:  * Full participation in all desired activities (may need albuterol before activity) * Albuterol use two time or less a week on average (not counting use with activity) * Cough interfering with sleep two time or less a month * Oral steroids no more than once a year * No hospitalizations  2. Chronic rhinitis -Continue using azelastine but you can increase to one spray per nostril up to twice daily as needed - Stop fluticasone due to nasal sore for now - Continue with Xyzal '5mg'$  daily.   3. Nasal sore Recommend scheduling a follow up appointment with ENT (Dr. Unice Bailey)   3. Schedule a follow up appointment in  2 months or sooner if needed

## 2020-08-31 ENCOUNTER — Other Ambulatory Visit: Payer: Self-pay

## 2020-08-31 ENCOUNTER — Ambulatory Visit: Payer: PPO | Admitting: Family

## 2020-08-31 ENCOUNTER — Encounter: Payer: Self-pay | Admitting: Family

## 2020-08-31 VITALS — BP 122/78 | HR 80 | Temp 98.0°F | Resp 18 | Ht 61.0 in | Wt 127.8 lb

## 2020-08-31 DIAGNOSIS — J310 Chronic rhinitis: Secondary | ICD-10-CM | POA: Diagnosis not present

## 2020-08-31 DIAGNOSIS — J4540 Moderate persistent asthma, uncomplicated: Secondary | ICD-10-CM | POA: Diagnosis not present

## 2020-08-31 DIAGNOSIS — J3489 Other specified disorders of nose and nasal sinuses: Secondary | ICD-10-CM | POA: Diagnosis not present

## 2020-08-31 MED ORDER — AZELASTINE HCL 0.1 % NA SOLN: NASAL | 5 refills | Status: DC

## 2020-08-31 MED ORDER — BREZTRI AEROSPHERE 160-9-4.8 MCG/ACT IN AERO: 2.0000 | INHALATION_SPRAY | Freq: Two times a day (BID) | RESPIRATORY_TRACT | 5 refills | Status: DC

## 2020-08-31 MED ORDER — SPACER/AERO-HOLDING CHAMBERS DEVI: 1.0000 | 0 refills | Status: DC

## 2020-08-31 MED ORDER — MONTELUKAST SODIUM 10 MG PO TABS: 10.0000 mg | ORAL_TABLET | Freq: Every day | ORAL | 1 refills | Status: DC

## 2020-08-31 MED ORDER — LEVOCETIRIZINE DIHYDROCHLORIDE 5 MG PO TABS: 5.0000 mg | ORAL_TABLET | Freq: Every evening | ORAL | 5 refills | Status: DC

## 2020-08-31 MED ORDER — ALBUTEROL SULFATE HFA 108 (90 BASE) MCG/ACT IN AERS: 2.0000 | INHALATION_SPRAY | RESPIRATORY_TRACT | 1 refills | Status: AC | PRN

## 2020-08-31 NOTE — Progress Notes (Signed)
[place], [place] [address] Dept: [phone]  FOLLOW UP NOTE  Patient ID: Stephanie Norris, female    DOB: 1955/09/19  Age: 65 y.o. MRN: [id number] Date of Office Visit: 08/31/2020  Assessment  Chief Complaint: Asthma (No issues or flares )  HPI Stephanie Norris is a 65 year old female who presents today for follow-up of not well controlled moderate persistent asthma and chronic rhinitis.  She was last seen on Jun 29, 2020 by Althea Charon, FNP.  Asthma is reported as moderately controlled with Breztri 1 puff once a day, montelukast 10 mg once a day and albuterol as needed.  She reports that when she went to her primary care physician for a sinus infection less than a month ago,her primary care physician switched her to [place].  She reports that she was wheezing really bad with Trelegy.  She is only using Breztri 1 puff once a day because she was not told to do any differently.  She reports that when she uses her Judithann Sauger it is hard for her to hold her breath due to needing to cough.  She does not use a spacer with her Breztri inhaler.  She reports shortness of breath if she does too much and denies coughing, wheezing, tightness in chest, and nocturnal awakenings due to breathing problems.  Since her last office visit she is not required any systemic steroids or made any trips to the emergency room or urgent care due to breathing problems.  She reports that she has not had to use her albuterol inhaler in quite a while.  Chronic rhinitis is reported as moderately controlled with azelastine nasal spray 1 spray each nostril once a day and Xyzal 5 mg once a day.  She has not been using fluticasone nasal spray.  She reports that she keeps a clear drippy runny nose all the time and has a little bit of postnasal drip but this is not bad.  She denies any nasal congestion.  She has had 1 sinus infection since we last saw her.  This was less than a month ago.  She reports that she  continues to have the scab in her right nostril.  She reports that when she saw ENT about the nasal sore they told her if she would leave it alone for 2 weeks that it would go away.  She reports that it is still there.  Instructed her to schedule a follow-up appointment with her ENT to discuss further.   Drug Allergies:  Allergies  Allergen Reactions   Nsaids Anaphylaxis   Bactrim [Sulfamethoxazole-Trimethoprim] Nausea Only    Upset stomach   Cephalexin Rash   Levofloxacin Rash   Macrobid [Nitrofurantoin] Rash    Review of Systems: Review of Systems  Constitutional:  Negative for chills and fever.  HENT:         Reports that she keeps a constant clear drippy nose and a little bit of postnasal drip.  She denies any nasal congestion.  Eyes:        Reports that her left eye is watery at times and will be crusty but he cannot ever see the crust.  She just will feel the crust  Respiratory:  Positive for shortness of breath. Negative for cough and wheezing.        She reports that she is short of breath if she does too much.  Denies any coughing, wheezing, tightness in her chest, and nocturnal awakenings due to breathing problems.  Cardiovascular:  Negative for chest pain and palpitations.  Gastrointestinal:        She reports that she has had a little bit of heartburn for the past couple days.  She takes pantoprazole at night.  She feels that this due to her diet.  Genitourinary:  Negative for dysuria.  Skin:  Negative for itching and rash.  Neurological:  Positive for headaches.       Reports occasional headaches  Endo/Heme/Allergies:  Negative for environmental allergies.    Physical Exam: BP 122/78   Pulse 80   Temp 98 F (36.7 C)   Resp 18   Ht '5\' 1"'$  (1.549 m)   Wt 127 lb 12.8 oz (58 kg)   SpO2 97%   BMI 24.15 kg/m    Physical Exam Constitutional:      Appearance: Normal appearance.  HENT:     Head: Normocephalic and atraumatic.     Comments: Pharynx normal, eyes  normal, ears normal, nose: Scabbing noted in right nostril, left nostril normal.    Right Ear: Tympanic membrane, ear canal and external ear normal.     Left Ear: Tympanic membrane, ear canal and external ear normal.     Mouth/Throat:     Mouth: Mucous membranes are moist.     Pharynx: Oropharynx is clear.  Eyes:     Conjunctiva/sclera: Conjunctivae normal.  Cardiovascular:     Rate and Rhythm: Normal rate and regular rhythm.     Heart sounds: Normal heart sounds.  Pulmonary:     Effort: Pulmonary effort is normal.     Breath sounds: Normal breath sounds.     Comments: Lungs clear to auscultation Musculoskeletal:     Cervical back: Neck supple.  Skin:    General: Skin is warm.  Neurological:     Mental Status: She is alert and oriented to person, place, and time.  Psychiatric:        Mood and Affect: Mood normal.        Behavior: Behavior normal.        Thought Content: Thought content normal.        Judgment: Judgment normal.    Diagnostics: FVC 2.15 L, FEV1 1.40 L.  Predicted FVC 2.63 L, predicted FEV1 2.07 L.  Spirometry indicates possible moderate obstruction.  Post bronchodilator response shows FVC 2.00 L, FEV1 1.31 L.  There is no change in her FEV1.  Assessment and Plan: 1. Moderate persistent asthma, uncomplicated   2. Chronic rhinitis   3. Sore in nostril     Meds ordered this encounter  Medications   azelastine (ASTELIN) 0.1 % nasal spray    Sig: Place 1 spray in each nostril twice a day as needed for runny drippy nose    Dispense:  30 mL    Refill:  5   montelukast (SINGULAIR) 10 MG tablet    Sig: Take 1 tablet (10 mg total) by mouth at bedtime.    Dispense:  90 tablet    Refill:  1   albuterol (VENTOLIN HFA) 108 (90 Base) MCG/ACT inhaler    Sig: Inhale 2 puffs into the lungs every 4 (four) hours as needed for wheezing or shortness of breath.    Dispense:  18 g    Refill:  1   Spacer/Aero-Holding Chambers DEVI    Sig: Take 1 each by mouth as directed.     Dispense:  1 each    Refill:  0   Budeson-Glycopyrrol-Formoterol (BREZTRI AEROSPHERE) 160-9-4.8 MCG/ACT AERO  Sig: Inhale 2 puffs into the lungs 2 (two) times daily. With spacer. rinse mouth after use.    Dispense:  10.7 g    Refill:  5   levocetirizine (XYZAL) 5 MG tablet    Sig: Take 1 tablet (5 mg total) by mouth every evening.    Dispense:  30 tablet    Refill:  5    Patient Instructions  1.  Not well controlled moderate persistent asthma, uncomplicated  - Daily controller medication(s): Continue Breztri,but increase to 2 puffs twice a day with spacer to help prevent cough and wheeze. Prescription sent for spacer. Demonstration given on how to properly use spacer. Also continue Singulair (montelukast) 10 mg once a day - Prior to physical activity: albuterol 2 puffs 10-15 minutes before physical activity. - Rescue medications: albuterol 4 puffs every 4-6 hours as needed - Asthma control goals:  * Full participation in all desired activities (may need albuterol before activity) * Albuterol use two time or less a week on average (not counting use with activity) * Cough interfering with sleep two time or less a month * Oral steroids no more than once a year * No hospitalizations  2. Chronic rhinitis -Continue using azelastine but you can increase to one spray per nostril up to twice daily as needed - Stop fluticasone due to nasal sore for now - Continue with Xyzal '5mg'$  daily.   3. Nasal sore Recommend scheduling a follow up appointment with ENT (Dr. Unice Bailey)   3. Schedule a follow up appointment in  2 months or sooner if needed         Return in about 2 months (around 11/01/2020), or if symptoms worsen or fail to improve.    Thank you for the opportunity to care for this patient.  Please do not hesitate to contact me with questions.  Althea Charon, FNP [facility]

## 2020-09-04 DIAGNOSIS — K219 Gastro-esophageal reflux disease without esophagitis: Secondary | ICD-10-CM | POA: Diagnosis not present

## 2020-09-04 DIAGNOSIS — I10 Essential (primary) hypertension: Secondary | ICD-10-CM | POA: Diagnosis not present

## 2020-09-06 ENCOUNTER — Other Ambulatory Visit (HOSPITAL_COMMUNITY): Payer: Self-pay | Admitting: Internal Medicine

## 2020-09-06 ENCOUNTER — Other Ambulatory Visit: Payer: Self-pay | Admitting: Internal Medicine

## 2020-09-06 DIAGNOSIS — R600 Localized edema: Secondary | ICD-10-CM | POA: Diagnosis not present

## 2020-09-06 DIAGNOSIS — W57XXXS Bitten or stung by nonvenomous insect and other nonvenomous arthropods, sequela: Secondary | ICD-10-CM | POA: Diagnosis not present

## 2020-09-13 ENCOUNTER — Ambulatory Visit (HOSPITAL_COMMUNITY)
Admission: RE | Admit: 2020-09-13 | Discharge: 2020-09-13 | Disposition: A | Discharge status: Home / Self Care | Payer: PPO | Source: Ambulatory Visit | Attending: Internal Medicine | Admitting: Internal Medicine

## 2020-09-13 ENCOUNTER — Other Ambulatory Visit: Payer: Self-pay

## 2020-09-13 DIAGNOSIS — R600 Localized edema: Secondary | ICD-10-CM | POA: Diagnosis not present

## 2020-09-16 DIAGNOSIS — R5383 Other fatigue: Secondary | ICD-10-CM | POA: Diagnosis not present

## 2020-09-16 DIAGNOSIS — R600 Localized edema: Secondary | ICD-10-CM | POA: Diagnosis not present

## 2020-09-16 DIAGNOSIS — R309 Painful micturition, unspecified: Secondary | ICD-10-CM | POA: Diagnosis not present

## 2020-10-05 DIAGNOSIS — I10 Essential (primary) hypertension: Secondary | ICD-10-CM | POA: Diagnosis not present

## 2020-10-05 DIAGNOSIS — K219 Gastro-esophageal reflux disease without esophagitis: Secondary | ICD-10-CM | POA: Diagnosis not present

## 2020-10-25 NOTE — Patient Instructions (Addendum)
1.  Not well controlled moderate persistent asthma, uncomplicated Stop Breztri - Daily controller medication(s): Continue Breo 200 mcg 1 puff once a day to help prevent cough and wheeze - continue Singulair (montelukast) 10 mg once a day - Prior to physical activity: albuterol 2 puffs 10-15 minutes before physical activity. - Rescue medications: albuterol 4 puffs every 4-6 hours as needed - Asthma control goals:  * Full participation in all desired activities (may need albuterol before activity) * Albuterol use two time or less a week on average (not counting use with activity) * Cough interfering with sleep two time or less a month * Oral steroids no more than once a year * No hospitalizations  2. Chronic rhinitis -Continue using azelastine one spray per nostril up to twice daily as needed for runny nose/drainage down throat - Continue with Xyzal (levocetirizine) 5 mg daily.   3. Nasal sore Recommend scheduling a follow up appointment with ENT (Dr. Unice Bailey) Recommend stopping the cream that says for external use only for the sore inside your nose  Recommend scheduling an appointment with your primary care physician to discuss the palpitations that you have been having at times   3. Schedule a follow up appointment in 3 months or sooner if needed

## 2020-10-26 ENCOUNTER — Encounter: Payer: Self-pay | Admitting: Family

## 2020-10-26 ENCOUNTER — Other Ambulatory Visit: Payer: Self-pay

## 2020-10-26 ENCOUNTER — Ambulatory Visit: Payer: PPO | Admitting: Family

## 2020-10-26 VITALS — BP 128/58 | HR 89 | Temp 97.3°F | Resp 16 | Ht 59.75 in | Wt 125.8 lb

## 2020-10-26 DIAGNOSIS — J310 Chronic rhinitis: Secondary | ICD-10-CM | POA: Diagnosis not present

## 2020-10-26 DIAGNOSIS — J4540 Moderate persistent asthma, uncomplicated: Secondary | ICD-10-CM

## 2020-10-26 DIAGNOSIS — J3489 Other specified disorders of nose and nasal sinuses: Secondary | ICD-10-CM

## 2020-10-26 NOTE — Progress Notes (Signed)
[place], [place] [address] Dept: [phone]  FOLLOW UP NOTE  Patient ID: Stephanie Norris, female    DOB: 1955/04/27  Age: 65 y.o. MRN: [id number] Date of Office Visit: 10/26/2020  Assessment  Chief Complaint: Asthma (Trelegy - has not been working and had a sinus infection. She was given a couple of breztri samples. It worked, but it makes her drowsy. She has now went back to her breo - its halft empty )  HPI Stephanie Norris is a 65 year old female that presents today for follow up of not well controlled moderate persistent asthma, chronic rhinitis, and nasal sore. She was last seen on 08/31/20 by Althea Charon, FNP.  Moderate persistent asthma is reported as moderately controlled with Breo 200 mcg 1 puff once a day. She is no longer using Breztri because it caused her to be drowsy. She reports that she hates that she had to stop Breztri because it really helped with her clogged ears. She has tried Trelegy in the past and reports that it did not work. She reports a little wheeze, tightness in her chest at times, and shortness of breath at times. She denies cough and nocturnal awakenings. Since her last office visit she has not required any systemic steroids or made any trips to the emergency room or urgent care due to breathing problems. She has used her albuterol inhaler once since her last office visit.  Chronic rhinitis is reported as moderately controlled with azelastine 1 spray each nostril once a day and Xyzal 5 mg once a day. She reports a drippy nose and not much post nasal drip. She denies nasal congestion.  She reports that her nasal sore is a little bit better. She reports that she had a tick bite on her leg that was not getting better so she went to her primary care physician and was given some type of "cortisone cream" to use on the tick bite on her leg. She started using a little bit of this cream in her nose even though it says for external use only. She  did not ever schedule an appointment with her ENT, Dr. Redmond Baseman to follow up on her nasal sore.   Drug Allergies:  Allergies  Allergen Reactions   Nsaids Anaphylaxis   Bactrim [Sulfamethoxazole-Trimethoprim] Nausea Only    Upset stomach   Cephalexin Rash   Levofloxacin Rash   Macrobid [Nitrofurantoin] Rash    Review of Systems: Review of Systems  Constitutional:  Negative for chills and fever.       Reports that she does not sleep well at night. She will not fall asleep until 2AM and wake up around 9-10 AM.  HENT:         Reports drippy nose and not much post nasal drip. Denies nasal congestion  Eyes:        Reports itchy eyes at times and her left eye will have crusting  Respiratory:  Positive for shortness of breath and wheezing. Negative for cough.        Reports a little wheeze, tightness in her chest at times, and shortness of breath sometimes. Denies cough and nocturnal awakenings due to breathing problems.  Cardiovascular:  Positive for palpitations. Negative for chest pain.       Reports palpitations at times. Has not seen cardiology  Gastrointestinal:        Denies heartburn and reflux-takes pantoprazole  Genitourinary:  Negative for frequency.  Skin:  Negative for rash.  Neurological:  Positive for headaches.    Physical Exam: BP (!) 128/58   Pulse 89   Temp (!) 97.3 F (36.3 C)   Resp 16   Ht 4' 11.75" (1.518 m)   Wt 125 lb 12.8 oz (57.1 kg)   SpO2 100%   BMI 24.77 kg/m    Physical Exam Constitutional:      Appearance: Normal appearance.  HENT:     Head: Normocephalic and atraumatic.     Comments: Pharynx normal. Eyes normal. Ears normal. Nose; scabbing noted in right nostril.    Right Ear: Tympanic membrane, ear canal and external ear normal.     Left Ear: Tympanic membrane, ear canal and external ear normal.     Mouth/Throat:     Mouth: Mucous membranes are moist.     Pharynx: Oropharynx is clear.  Eyes:     Conjunctiva/sclera: Conjunctivae normal.   Cardiovascular:     Rate and Rhythm: Regular rhythm.     Heart sounds: Normal heart sounds.  Pulmonary:     Effort: Pulmonary effort is normal.     Breath sounds: Normal breath sounds.     Comments: Lungs clear to auscultation Musculoskeletal:     Cervical back: Neck supple.  Skin:    General: Skin is warm.  Neurological:     Mental Status: She is alert and oriented to person, place, and time.  Psychiatric:        Mood and Affect: Mood normal.        Behavior: Behavior normal.        Thought Content: Thought content normal.        Judgment: Judgment normal.    Diagnostics: FVC 2.17 L, FEV1 1.76 L.  Predicted FVC 2.44 L, predicted FEV1 1.92 L.  Spirometry indicates normal respiratory function.  Assessment and Plan: 1. Moderate persistent asthma, uncomplicated   2. Chronic rhinitis   3. Nasal sore     No orders of the defined types were placed in this encounter.   Patient Instructions  1.  Not well controlled moderate persistent asthma, uncomplicated Stop Breztri - Daily controller medication(s): Continue Breo 200 mcg 1 puff once a day to help prevent cough and wheeze - continue Singulair (montelukast) 10 mg once a day - Prior to physical activity: albuterol 2 puffs 10-15 minutes before physical activity. - Rescue medications: albuterol 4 puffs every 4-6 hours as needed - Asthma control goals:  * Full participation in all desired activities (may need albuterol before activity) * Albuterol use two time or less a week on average (not counting use with activity) * Cough interfering with sleep two time or less a month * Oral steroids no more than once a year * No hospitalizations  2. Chronic rhinitis -Continue using azelastine one spray per nostril up to twice daily as needed for runny nose/drainage down throat - Continue with Xyzal (levocetirizine) 5 mg daily.   3. Nasal sore Recommend scheduling a follow up appointment with ENT (Dr. Unice Bailey) Recommend stopping  the cream that says for external use only for the sore inside your nose  Recommend scheduling an appointment with your primary care physician to discuss the palpitations that you have been having at times   3. Schedule a follow up appointment in 3 months or sooner if needed          Return in about 3 months (around 01/25/2021), or if symptoms worsen or fail to improve.    Thank you for the opportunity to  care for this patient.  Please do not hesitate to contact me with questions.  Althea Charon, FNP [facility]

## 2020-10-27 ENCOUNTER — Encounter: Payer: Self-pay | Admitting: Family

## 2020-10-31 DIAGNOSIS — R7301 Impaired fasting glucose: Secondary | ICD-10-CM | POA: Diagnosis not present

## 2020-10-31 DIAGNOSIS — E119 Type 2 diabetes mellitus without complications: Secondary | ICD-10-CM | POA: Diagnosis not present

## 2020-10-31 DIAGNOSIS — E781 Pure hyperglyceridemia: Secondary | ICD-10-CM | POA: Diagnosis not present

## 2020-10-31 DIAGNOSIS — E1142 Type 2 diabetes mellitus with diabetic polyneuropathy: Secondary | ICD-10-CM | POA: Diagnosis not present

## 2020-10-31 DIAGNOSIS — D72829 Elevated white blood cell count, unspecified: Secondary | ICD-10-CM | POA: Diagnosis not present

## 2020-10-31 DIAGNOSIS — E7849 Other hyperlipidemia: Secondary | ICD-10-CM | POA: Diagnosis not present

## 2020-10-31 DIAGNOSIS — E1121 Type 2 diabetes mellitus with diabetic nephropathy: Secondary | ICD-10-CM | POA: Diagnosis not present

## 2020-10-31 DIAGNOSIS — Z0001 Encounter for general adult medical examination with abnormal findings: Secondary | ICD-10-CM | POA: Diagnosis not present

## 2020-10-31 DIAGNOSIS — K219 Gastro-esophageal reflux disease without esophagitis: Secondary | ICD-10-CM | POA: Diagnosis not present

## 2020-10-31 DIAGNOSIS — E782 Mixed hyperlipidemia: Secondary | ICD-10-CM | POA: Diagnosis not present

## 2020-10-31 DIAGNOSIS — R5383 Other fatigue: Secondary | ICD-10-CM | POA: Diagnosis not present

## 2020-10-31 DIAGNOSIS — Z0000 Encounter for general adult medical examination without abnormal findings: Secondary | ICD-10-CM | POA: Diagnosis not present

## 2020-11-01 NOTE — Progress Notes (Signed)
Office Visit Note  Patient: Stephanie Norris             Date of Birth: 02/26/1955           MRN: [id number]             PCP: Celene Squibb, MD Referring: Celene Squibb, MD Visit Date: 11/04/2020 Occupation: _0 @  Subjective:  Bilateral SI Joint pain   History of Present Illness: Stephanie Norris is a 65 y.o. female with history of seropositive rheumatoid arthritis, osteoarthritis, fibromyalgia.  Patient is currently taking methotrexate 6 tablets by mouth once weekly and folic acid 2 mg by mouth daily.  She continues to tolerate methotrexate without any side effects.  She denies any recent rheumatoid arthritis flares.  She states over the past 1 month she has been experiencing increased pain in bilateral SI joints especially the left side.  She denies any radiating pain.  She states that her lumbar spinal pain and radiculopathy resolved after having surgery performed by Dr. Arnoldo Morale in April 2021.  She continues to have ongoing discomfort over the left trochanteric bursa as well.  She has been having pain at night when lying on her side.  She had a left trochanteric bursa cortisone injection on 03/04/20.  She has been taking Tylenol 2 tablets daily and ibuprofen once daily for pain relief.  She states that her fibromyalgia is currently flaring as well.  She is having generalized myalgias and muscle tenderness.    Activities of Daily Living:  Patient reports morning stiffness for 1 hour Patient Reports nocturnal pain.  Difficulty dressing/grooming: Denies Difficulty climbing stairs: Denies Difficulty getting out of chair: Denies Difficulty using hands for taps, buttons, cutlery, and/or writing: Reports  Review of Systems  Constitutional:  Positive for fatigue.  HENT:  Negative for mouth sores, mouth dryness and nose dryness.   Eyes:  Negative for pain, visual disturbance and dryness.  Respiratory:  Negative for cough, hemoptysis, shortness of breath and difficulty breathing.    Cardiovascular:  Negative for chest pain, palpitations, hypertension and swelling in legs/feet.  Gastrointestinal:  Negative for blood in stool, constipation and diarrhea.  Endocrine: Negative for increased urination.  Genitourinary:  Negative for painful urination.  Musculoskeletal:  Positive for joint pain, joint pain, myalgias, morning stiffness, muscle tenderness and myalgias. Negative for joint swelling and muscle weakness.  Skin:  Negative for color change, pallor, rash, hair loss, nodules/bumps, skin tightness, ulcers and sensitivity to sunlight.  Allergic/Immunologic: Negative for susceptible to infections.  Neurological:  Negative for dizziness, numbness, headaches and weakness.  Hematological:  Negative for swollen glands.  Psychiatric/Behavioral:  Positive for depressed mood and sleep disturbance. The patient is not nervous/anxious.    PMFS History:  Patient Active Problem List   Diagnosis Date Noted   Mucous cyst of digit of left hand 04/07/2020   Vertigo 08/20/2016   Eustachian tube dysfunction 04/17/2016   Abdominal pain 03/02/2016   Gastroenteritis 03/02/2016   Rheumatoid arthritis with rheumatoid factor of multiple sites without organ or systems involvement ([place]) 01/13/2016   HLA B27 (HLA B27 positive) 01/13/2016   Osteoarthritis of foot 01/13/2016   DJD (degenerative joint disease), cervical 01/13/2016   Spondylosis of lumbar region without myelopathy or radiculopathy 01/13/2016   Primary osteoarthritis of both hands 01/13/2016   Non-seasonal allergic rhinitis 08/13/2014   Stiffness of joints, not elsewhere classified, multiple sites 08/26/2013   Flu-like symptoms 03/02/2013   Lactic acidosis 03/02/2013   Sinus tachycardia 03/02/2013   Elevated  lactic acid level 03/02/2013   Influenza due to identified novel influenza A virus with other respiratory manifestations 03/02/2013   Hyperglycemia 03/02/2013   Fibromyalgia    OSA (obstructive sleep apnea) 06/22/2011    DOE (dyspnea on exertion) 05/30/2011   Moderate persistent asthma 03/30/2011    Past Medical History:  Diagnosis Date   Asthma    COPD (chronic obstructive pulmonary disease) (HCC)    Diabetes mellitus without complication (HCC)    Type 2   Dizziness    Fibromyalgia    GERD (gastroesophageal reflux disease)    Headache    Hyperlipidemia    OSA (obstructive sleep apnea)    Osteoarthritis    PCOS (polycystic ovarian syndrome)    Raynaud disease    Rheumatoid arthritis ([place])     Family History  Problem Relation Age of Onset   Asthma Mother    Rheum arthritis Mother    Allergic rhinitis Mother    Rheum arthritis Sister    Allergic rhinitis Sister    Heart failure Father    Heart disease Father    Rheum arthritis Sister    Allergic rhinitis Sister    Allergic rhinitis Brother    Allergies Other        "Everyone"   Heart disease Maternal Grandfather    Heart disease Maternal Grandmother    Heart disease Paternal Grandfather    Heart disease Paternal Grandmother    Past Surgical History:  Procedure Laterality Date   APPENDECTOMY     BACK SURGERY  2021   CHOLECYSTECTOMY     COLONOSCOPY W/ BIOPSIES  fall 2009   COMBINED HYSTERECTOMY VAGINAL / OOPHORECTOMY / A&P REPAIR  10/2002   endometriosis, cystocele, fibroids   FINGER SURGERY Left 05/09/2020   spurs removed from left index finger   [facility] FISTULA CLOSURE W/ TAH     Social History   Social History Narrative   Lives with husband   Retired    no children   Assoc degree   16 oz caffeine daily   Immunization History  Administered Date(s) Administered   Influenza Whole 10/07/2010, 11/06/2011   Influenza,inj,Quad PF,6+ Mos 11/05/2012, 11/02/2015, 11/22/2016, 11/15/2017   Influenza-Unspecified 11/05/2013   Moderna Sars-Covid-2 Vaccination 04/13/2019, 05/11/2019   Pneumococcal Conjugate-13 11/06/2014   Pneumococcal Polysaccharide-23 11/02/2015     Objective: Vital  Signs: BP 118/74 (BP Location: Right Arm, Patient Position: Sitting, Cuff Size: Small)   Pulse 81   Resp 12   Ht 4' 11.75" (1.518 m)   BMI 24.77 kg/m    Physical Exam Vitals and nursing note reviewed.  Constitutional:      Appearance: She is well-developed.  HENT:     Head: Normocephalic and atraumatic.  Eyes:     Conjunctiva/sclera: Conjunctivae normal.  Pulmonary:     Effort: Pulmonary effort is normal.  Abdominal:     Palpations: Abdomen is soft.  Musculoskeletal:     Cervical back: Normal range of motion.  Skin:    General: Skin is warm and dry.     Capillary Refill: Capillary refill takes less than 2 seconds.  Neurological:     Mental Status: She is alert and oriented to person, place, and time.  Psychiatric:        Behavior: Behavior normal.     Musculoskeletal Exam: Generalized hyperalgesia and positive tender points on examination.  C-spine has good range of motion with no discomfort.  No midline spinal  tenderness.  Tenderness over bilateral SI joints, left greater than right.  Shoulder joints, elbow joints, wrist joints, MCPs, PIPs, DIPs have good range of motion with no synovitis.  PIP and DIP prominence consistent with osteoarthritis of both hands.  Complete fist formation bilaterally.  Hip joints have good range of motion with no discomfort.  Tenderness over the left trochanteric bursa.  Knee joints have good range of motion with no warmth or effusion.  Ankle joints have good range of motion with no tenderness or joint swelling.  CDAI Exam: CDAI Score: 0.4  Patient Global: 2 mm; Provider Global: 2 mm Swollen: 0 ; Tender: 2  Joint Exam 11/04/2020      Right  Left  Sacroiliac   Tender   Tender     Investigation: No additional findings.  Imaging: No results found.  Recent Labs: Lab Results  Component Value Date   WBC 11.9 (H) 07/26/2020   HGB 12.6 07/26/2020   PLT 412 (H) 07/26/2020   NA 136 07/26/2020   K 4.6 07/26/2020   CL 102 07/26/2020   CO2 20  07/26/2020   GLUCOSE 220 (H) 07/26/2020   BUN 29 (H) 07/26/2020   CREATININE 0.75 07/26/2020   BILITOT 0.2 07/26/2020   ALKPHOS 54 09/10/2016   AST 14 07/26/2020   ALT 19 07/26/2020   PROT 6.7 07/26/2020   ALBUMIN 4.0 09/10/2016   CALCIUM 10.1 07/26/2020   GFRAA 96 07/26/2020    Speciality Comments: No specialty comments available.  Procedures:  Sacroiliac Joint Inj on 11/04/2020 11:30 AM Indications: pain Details: 27 G 1.5 in needle, posterior approach Medications: 1 mL lidocaine 1 %; 40 mg triamcinolone acetonide 40 MG/ML Aspirate: 0 mL Outcome: tolerated well, no immediate complications Procedure, treatment alternatives, risks and benefits explained, specific risks discussed. Consent was given by the patient. Immediately prior to procedure a time out was called to verify the correct patient, procedure, equipment, support staff and site/side marked as required. Patient was prepped and draped in the usual sterile fashion.    Allergies: Nsaids, Bactrim [sulfamethoxazole-trimethoprim], Cephalexin, Levofloxacin, and Macrobid [nitrofurantoin]   Assessment / Plan:     Visit Diagnoses: Rheumatoid arthritis with rheumatoid factor of multiple sites without organ or systems involvement ([place]) - +CCP: She has no joint tenderness or synovitis on examination today.  She has not had any signs or symptoms of a rheumatoid arthritis flare recently.  She is clinically doing well taking methotrexate 6 tablets by mouth once weekly and folic acid 2 mg by mouth daily.  She continues to tolerate methotrexate without any side effects and has not missed any doses recently.  She is currently having a fibromyalgia flare which is causing some increased myalgias, muscle tenderness, and fatigue.  Overall her rheumatoid arthritis is well controlled on the current treatment regimen.  No medication changes will be made at this time.  She was advised to notify us if she develops increased joint pain or joint swelling.   She will follow-up in the office in 5 months.  High risk medication use - Methotrexate 6 tablets by mouth every 7 days and folic acid 1 mg 2 tablets daily.  CBC and CMP were drawn on 07/26/2020.  She is due to update lab work today.  Orders for CBC and CMP were released.  She will continue to require lab monitoring every 3 months to monitor for drug toxicity.  Standing orders for CBC and CMP remain in place. Discussed the importance of holding methotrexate if she develops signs  or symptoms of an infection and to resume once the infection is completely cleared.  HLA B27 (HLA B27 positive)  Primary osteoarthritis of both hands: She has PIP and DIP prominence consistent with osteoarthritis of both hands.  No tenderness or inflammation was noted on examination today.  She was able to make a complete fist bilaterally.  Discussed the importance of joint protection and muscle strengthening.  Primary osteoarthritis of both feet: She has good range of motion of both ankle joints with no tenderness or joint swelling.  She is not experiencing any discomfort in her feet at this time.  Fibromyalgia: She is currently having a fibromyalgia flare.  She has generalized hyperalgesia and positive tender points on examination today.  She attributes this flare to weather changes.  She remains on Cymbalta 60 mg 1 capsule daily.  She presents today with SI joint pain bilaterally and ongoing discomfort due to trochanter bursitis of the left hip.  X-rays of the pelvis were updated today and the left SI joint was injected with cortisone. We discussed the importance of regular exercise and good sleep hygiene for management of fibromyalgia.  Trapezius muscle spasm: Tender points noted on examination today.  Trochanteric bursitis of left hip: She has tenderness over the left trochanteric bursa.  Discussed the importance of performing daily stretching exercises.  She had a cortisone injection performed on 03/04/2020 which provided  significant relief.  She was given a handout of exercises to perform.   DDD (degenerative disc disease), cervical: She has good range of motion of the C-spine with some discomfort.  Trapezius muscle tension and tenderness bilaterally.  DDD (degenerative disc disease), lumbar: Discectomy performed April 2021 by Dr. Arnoldo Morale.   Chronic SI joint pain -She presents today with acute on chronic SI joint pain bilaterally.  She has not had any recent injury or fall.  Her discomfort has progressively been worsening over the past 1 month.  She has been experiencing nocturnal pain when laying on her left side.  She has ongoing discomfort due to trochanteric bursitis of the left hip.  X-rays of the pelvis were updated today.  After informed consent the left SI joint was injected with cortisone as requested.  She tolerated procedure well.  Procedure note was completed above.  Aftercare was discussed.  She was advised to notify us if her discomfort persists or worsens.  Other fatigue: Chronic and secondary to insomnia.   Other medical conditions are listed as follows:   History of sleep apnea  History of asthma  History of diabetes mellitus  History of depression - She remains on Cymbalta 60 mg 1 capsule by mouth daily.  Orders: Orders Placed This Encounter  Procedures   Sacroiliac Joint Inj   XR Pelvis 1-2 Views   CBC with Differential/Platelet   COMPLETE METABOLIC PANEL WITH GFR    No orders of the defined types were placed in this encounter.     Follow-Up Instructions: Return in about 5 months (around 04/04/2021) for Rheumatoid arthritis, Osteoarthritis, Fibromyalgia.   Ofilia Neas, PA-C  Note - This record has been created using Dragon software.  Chart creation errors have been sought, but may not always  have been located. Such creation errors do not reflect on  the standard of medical care.

## 2020-11-02 ENCOUNTER — Ambulatory Visit: Payer: PPO | Admitting: Family

## 2020-11-04 ENCOUNTER — Ambulatory Visit: Payer: Self-pay

## 2020-11-04 ENCOUNTER — Ambulatory Visit: Payer: PPO | Admitting: Physician Assistant

## 2020-11-04 ENCOUNTER — Other Ambulatory Visit: Payer: Self-pay

## 2020-11-04 ENCOUNTER — Encounter: Payer: Self-pay | Admitting: Physician Assistant

## 2020-11-04 VITALS — BP 118/74 | HR 81 | Resp 12 | Ht 59.75 in | Wt 126.6 lb

## 2020-11-04 DIAGNOSIS — M533 Sacrococcygeal disorders, not elsewhere classified: Secondary | ICD-10-CM

## 2020-11-04 DIAGNOSIS — Z8639 Personal history of other endocrine, nutritional and metabolic disease: Secondary | ICD-10-CM

## 2020-11-04 DIAGNOSIS — R5383 Other fatigue: Secondary | ICD-10-CM | POA: Diagnosis not present

## 2020-11-04 DIAGNOSIS — Z8659 Personal history of other mental and behavioral disorders: Secondary | ICD-10-CM

## 2020-11-04 DIAGNOSIS — M19072 Primary osteoarthritis, left ankle and foot: Secondary | ICD-10-CM

## 2020-11-04 DIAGNOSIS — M797 Fibromyalgia: Secondary | ICD-10-CM

## 2020-11-04 DIAGNOSIS — M5136 Other intervertebral disc degeneration, lumbar region: Secondary | ICD-10-CM

## 2020-11-04 DIAGNOSIS — Z1589 Genetic susceptibility to other disease: Secondary | ICD-10-CM

## 2020-11-04 DIAGNOSIS — M19071 Primary osteoarthritis, right ankle and foot: Secondary | ICD-10-CM

## 2020-11-04 DIAGNOSIS — Z79899 Other long term (current) drug therapy: Secondary | ICD-10-CM

## 2020-11-04 DIAGNOSIS — Z8669 Personal history of other diseases of the nervous system and sense organs: Secondary | ICD-10-CM

## 2020-11-04 DIAGNOSIS — G8929 Other chronic pain: Secondary | ICD-10-CM | POA: Diagnosis not present

## 2020-11-04 DIAGNOSIS — M7062 Trochanteric bursitis, left hip: Secondary | ICD-10-CM

## 2020-11-04 DIAGNOSIS — M5030 Other cervical disc degeneration, unspecified cervical region: Secondary | ICD-10-CM | POA: Diagnosis not present

## 2020-11-04 DIAGNOSIS — M19042 Primary osteoarthritis, left hand: Secondary | ICD-10-CM

## 2020-11-04 DIAGNOSIS — M51369 Other intervertebral disc degeneration, lumbar region without mention of lumbar back pain or lower extremity pain: Secondary | ICD-10-CM

## 2020-11-04 DIAGNOSIS — M19041 Primary osteoarthritis, right hand: Secondary | ICD-10-CM

## 2020-11-04 DIAGNOSIS — M0579 Rheumatoid arthritis with rheumatoid factor of multiple sites without organ or systems involvement: Secondary | ICD-10-CM | POA: Diagnosis not present

## 2020-11-04 DIAGNOSIS — M62838 Other muscle spasm: Secondary | ICD-10-CM | POA: Diagnosis not present

## 2020-11-04 DIAGNOSIS — Z8709 Personal history of other diseases of the respiratory system: Secondary | ICD-10-CM

## 2020-11-04 DIAGNOSIS — M7061 Trochanteric bursitis, right hip: Secondary | ICD-10-CM

## 2020-11-04 MED ORDER — LIDOCAINE HCL 1 % IJ SOLN
1.0000 mL | INTRAMUSCULAR | Status: AC | PRN
  Administered 2020-11-04: 1 mL

## 2020-11-04 MED ORDER — TRIAMCINOLONE ACETONIDE 40 MG/ML IJ SUSP
40.0000 mg | INTRAMUSCULAR | Status: AC | PRN
  Administered 2020-11-04: 40 mg via INTRA_ARTICULAR

## 2020-11-04 NOTE — Patient Instructions (Addendum)
Standing Labs We placed an order today for your standing lab work.   Please have your standing labs drawn in December and every 3 months   If possible, please have your labs drawn 2 weeks prior to your appointment so that the provider can discuss your results at your appointment.  Please note that you may see your imaging and lab results in [facility] before we have reviewed them. We may be awaiting multiple results to interpret others before contacting you. Please allow our office up to 72 hours to thoroughly review all of the results before contacting the office for clarification of your results.  We have open lab daily: Monday through Thursday from 1:30-4:30 PM and Friday from 1:30-4:00 PM at the office of Dr. Bo Merino, [facility].   Please be advised, all patients with office appointments requiring lab work will take precedent over walk-in lab work.  If possible, please come for your lab work on Monday and Friday afternoons, as you may experience shorter wait times. The office is located at [address], [place], [place], [place] [address] No appointment is necessary.   Labs are drawn by [facility]. Please bring your co-pay at the time of your lab draw.  You may receive a bill from [facility] for your lab work.  If you wish to have your labs drawn at another location, please call the office 24 hours in advance to send orders.  If you have any questions regarding directions or hours of operation,  please call [phone].   As a reminder, please drink plenty of water prior to coming for your lab work. Thanks!   Hip Bursitis Rehab Ask your health care provider which exercises are safe for you. Do exercises exactly as told by your health care provider and adjust them as directed. It is normal to feel mild stretching, pulling, tightness, or discomfort as you do these exercises. Stop right away if you feel sudden pain or your pain gets worse. Do not begin these exercises  until told by your health care provider. Stretching exercise This exercise warms up your muscles and joints and improves the movement and flexibility of your hip. This exercise also helps to relieve pain and stiffness. Iliotibial band stretch An iliotibial band is a strong band of muscle tissue that runs from the outer side of your hip to the outer side of your thigh and knee. Lie on your side with your left / right leg in the top position. Bend your left / right knee and grab your ankle. Stretch out your bottom arm to help you balance. Slowly bring your knee back so your thigh is behind your body. Slowly lower your knee toward the floor until you feel a gentle stretch on the outside of your left / right thigh. If you do not feel a stretch and your knee will not fall farther, place the heel of your other foot on top of your knee and pull your knee down toward the floor with your foot. Hold this position for __________ seconds. Slowly return to the starting position. Repeat __________ times. Complete this exercise __________ times a day. Strengthening exercises These exercises build strength and endurance in your hip and pelvis. Endurance is the ability to use your muscles for a long time, even after they get tired. Bridge This exercise strengthens the muscles that move your thigh backward (hip extensors). Lie on your back on a firm surface with your knees bent and your feet flat on the floor. Tighten  your buttocks muscles and lift your buttocks off the floor until your trunk is level with your thighs. Do not arch your back. You should feel the muscles working in your buttocks and the back of your thighs. If you do not feel these muscles, slide your feet 1-2 inches (2.5-5 cm) farther away from your buttocks. If this exercise is too easy, try doing it with your arms crossed over your chest. Hold this position for __________ seconds. Slowly lower your hips to the starting position. Let your  muscles relax completely after each repetition. Repeat __________ times. Complete this exercise __________ times a day. Squats This exercise strengthens the muscles in front of your thigh and knee (quadriceps). Stand in front of a table, with your feet and knees pointing straight ahead. You may rest your hands on the table for balance but not for support. Slowly bend your knees and lower your hips like you are going to sit in a chair. Keep your weight over your heels, not over your toes. Keep your lower legs upright so they are parallel with the table legs. Do not let your hips go lower than your knees. Do not bend lower than told by your health care provider. If your hip pain increases, do not bend as low. Hold the squat position for __________ seconds. Slowly push with your legs to return to standing. Do not use your hands to pull yourself to standing. Repeat __________ times. Complete this exercise __________ times a day. Hip hike Stand sideways on a bottom step. Stand on your left / right leg with your other foot unsupported next to the step. You can hold on to the railing or wall for balance if needed. Keep your knees straight and your torso square. Then lift your left / right hip up toward the ceiling. Hold this position for __________ seconds. Slowly let your left / right hip lower toward the floor, past the starting position. Your foot should get closer to the floor. Do not lean or bend your knees. Repeat __________ times. Complete this exercise __________ times a day. Single leg stand Without shoes, stand near a railing or in a doorway. You may hold on to the railing or door frame as needed for balance. Squeeze your left / right buttock muscles, then lift up your other foot. Do not let your left / right hip push out to the side. It is helpful to stand in front of a mirror for this exercise so you can watch your hip. Hold this position for __________ seconds. Repeat __________  times. Complete this exercise __________ times a day. This information is not intended to replace advice given to you by your health care provider. Make sure you discuss any questions you have with your health care provider. Document Revised: 05/19/2018 Document Reviewed: 05/19/2018 Elsevier Patient Education  [employer].

## 2020-11-05 LAB — CBC WITH DIFFERENTIAL/PLATELET
Absolute Monocytes: 479 cells/uL (ref 200–950)
Basophils Absolute: 66 cells/uL (ref 0–200)
Basophils Relative: 0.7 %
Eosinophils Absolute: 216 cells/uL (ref 15–500)
Eosinophils Relative: 2.3 %
HCT: 36.7 % (ref 35.0–45.0)
Hemoglobin: 11.9 g/dL (ref 11.7–15.5)
Lymphs Abs: 2980 cells/uL (ref 850–3900)
MCH: 28.3 pg (ref 27.0–33.0)
MCHC: 32.4 g/dL (ref 32.0–36.0)
MCV: 87.4 fL (ref 80.0–100.0)
MPV: 11.9 fL (ref 7.5–12.5)
Monocytes Relative: 5.1 %
Neutro Abs: 5659 cells/uL (ref 1500–7800)
Neutrophils Relative %: 60.2 %
Platelets: 390 10*3/uL (ref 140–400)
RBC: 4.2 10*6/uL (ref 3.80–5.10)
RDW: 16 % — ABNORMAL HIGH (ref 11.0–15.0)
Total Lymphocyte: 31.7 %
WBC: 9.4 10*3/uL (ref 3.8–10.8)

## 2020-11-05 LAB — COMPLETE METABOLIC PANEL WITH GFR
AG Ratio: 1.8 (calc) (ref 1.0–2.5)
ALT: 25 U/L (ref 6–29)
AST: 15 U/L (ref 10–35)
Albumin: 4.3 g/dL (ref 3.6–5.1)
Alkaline phosphatase (APISO): 76 U/L (ref 37–153)
BUN: 22 mg/dL (ref 7–25)
CO2: 20 mmol/L (ref 20–32)
Calcium: 9.6 mg/dL (ref 8.6–10.4)
Chloride: 107 mmol/L (ref 98–110)
Creat: 0.75 mg/dL (ref 0.50–1.05)
Globulin: 2.4 g/dL (calc) (ref 1.9–3.7)
Glucose, Bld: 234 mg/dL — ABNORMAL HIGH (ref 65–99)
Potassium: 3.9 mmol/L (ref 3.5–5.3)
Sodium: 138 mmol/L (ref 135–146)
Total Bilirubin: 0.3 mg/dL (ref 0.2–1.2)
Total Protein: 6.7 g/dL (ref 6.1–8.1)
eGFR: 87 mL/min/{1.73_m2} (ref >=60)

## 2020-11-05 NOTE — Progress Notes (Signed)
Glucose is elevated-234. Rest of CMP WNL.  CBC WNL.

## 2020-11-09 DIAGNOSIS — M0579 Rheumatoid arthritis with rheumatoid factor of multiple sites without organ or systems involvement: Secondary | ICD-10-CM | POA: Diagnosis not present

## 2020-11-09 DIAGNOSIS — I10 Essential (primary) hypertension: Secondary | ICD-10-CM | POA: Diagnosis not present

## 2020-11-09 DIAGNOSIS — J4540 Moderate persistent asthma, uncomplicated: Secondary | ICD-10-CM | POA: Diagnosis not present

## 2020-11-09 DIAGNOSIS — G9009 Other idiopathic peripheral autonomic neuropathy: Secondary | ICD-10-CM | POA: Diagnosis not present

## 2020-11-09 DIAGNOSIS — G4733 Obstructive sleep apnea (adult) (pediatric): Secondary | ICD-10-CM | POA: Diagnosis not present

## 2020-11-09 DIAGNOSIS — Z23 Encounter for immunization: Secondary | ICD-10-CM | POA: Diagnosis not present

## 2020-11-09 DIAGNOSIS — E782 Mixed hyperlipidemia: Secondary | ICD-10-CM | POA: Diagnosis not present

## 2020-11-09 DIAGNOSIS — K219 Gastro-esophageal reflux disease without esophagitis: Secondary | ICD-10-CM | POA: Diagnosis not present

## 2020-11-09 DIAGNOSIS — J302 Other seasonal allergic rhinitis: Secondary | ICD-10-CM | POA: Diagnosis not present

## 2020-11-09 DIAGNOSIS — E1142 Type 2 diabetes mellitus with diabetic polyneuropathy: Secondary | ICD-10-CM | POA: Diagnosis not present

## 2020-11-09 DIAGNOSIS — Z0001 Encounter for general adult medical examination with abnormal findings: Secondary | ICD-10-CM | POA: Diagnosis not present

## 2020-11-09 DIAGNOSIS — F331 Major depressive disorder, recurrent, moderate: Secondary | ICD-10-CM | POA: Diagnosis not present

## 2020-11-16 DIAGNOSIS — N3946 Mixed incontinence: Secondary | ICD-10-CM | POA: Diagnosis not present

## 2020-11-16 DIAGNOSIS — R350 Frequency of micturition: Secondary | ICD-10-CM | POA: Diagnosis not present

## 2020-12-05 DIAGNOSIS — I10 Essential (primary) hypertension: Secondary | ICD-10-CM | POA: Diagnosis not present

## 2020-12-05 DIAGNOSIS — K219 Gastro-esophageal reflux disease without esophagitis: Secondary | ICD-10-CM | POA: Diagnosis not present

## 2020-12-23 ENCOUNTER — Other Ambulatory Visit: Payer: Self-pay | Admitting: Physician Assistant

## 2020-12-26 NOTE — Telephone Encounter (Signed)
Next Visit: 03/31/2021  Last Visit: 11/04/2020   Last Fill: 08/23/2020   DX: Rheumatoid arthritis with rheumatoid factor of multiple sites without organ or systems involvement   Current Dose per office note 11/04/2020: Methotrexate 6 tablets by mouth every 7 days   Labs: 11/04/2020 Glucose is elevated-234. Rest of CMP WNL.  CBC WNL.   Okay to refill Methotrexate?

## 2020-12-28 DIAGNOSIS — N3946 Mixed incontinence: Secondary | ICD-10-CM | POA: Diagnosis not present

## 2021-01-03 ENCOUNTER — Telehealth: Payer: Self-pay | Admitting: Rheumatology

## 2021-01-03 ENCOUNTER — Ambulatory Visit: Payer: PPO | Admitting: Rheumatology

## 2021-01-03 DIAGNOSIS — N3946 Mixed incontinence: Secondary | ICD-10-CM | POA: Diagnosis not present

## 2021-01-03 NOTE — Telephone Encounter (Signed)
Patient calling in reference to MTX refill. Per patient [facility] is telling her they do not have refill that was sent in on 12/26/2020. Please call pharmacy with refill, and call patient once done.

## 2021-01-03 NOTE — Telephone Encounter (Signed)
Spoke with the pharmacy and they state they received the prescription and they have filled the prescription. They state the patient just picked up the prescription.

## 2021-01-24 DIAGNOSIS — J069 Acute upper respiratory infection, unspecified: Secondary | ICD-10-CM | POA: Diagnosis not present

## 2021-01-24 NOTE — Patient Instructions (Addendum)
1.  moderate persistent asthma, uncomplicated-Breztri caused drowsiness and Trelegy not effective  - Daily controller medication(s): Continue Breo 200 mcg 1 puff once a day to help prevent cough and wheeze - continue Singulair (montelukast) 10 mg once a day - Prior to physical activity: albuterol 2 puffs 10-15 minutes before physical activity. - Rescue medications: albuterol 4 puffs every 4-6 hours as needed - Asthma control goals:  * Full participation in all desired activities (may need albuterol before activity) * Albuterol use two time or less a week on average (not counting use with activity) * Cough interfering with sleep two time or less a month * Oral steroids no more than once a year * No hospitalizations  2. Chronic rhinitis -continue prednisone and Zirthromax as per your primary care physician -Continue using azelastine one spray per nostril up to twice daily as needed for runny nose/drainage down throat - Continue with Xyzal (levocetirizine) 5 mg daily.   3. Nasal sore Recommend scheduling a follow up appointment with ENT (Dr. Unice Bailey) Recommend stopping the cream that says for external use only for the sore inside your nose     Schedule a follow up appointment in 4 months or sooner if needed

## 2021-01-25 ENCOUNTER — Other Ambulatory Visit: Payer: Self-pay

## 2021-01-25 ENCOUNTER — Encounter: Payer: Self-pay | Admitting: Family

## 2021-01-25 ENCOUNTER — Ambulatory Visit (INDEPENDENT_AMBULATORY_CARE_PROVIDER_SITE_OTHER): Payer: PPO | Admitting: Family

## 2021-01-25 VITALS — BP 130/68 | HR 83 | Temp 98.0°F | Resp 18

## 2021-01-25 DIAGNOSIS — J3489 Other specified disorders of nose and nasal sinuses: Secondary | ICD-10-CM

## 2021-01-25 DIAGNOSIS — J310 Chronic rhinitis: Secondary | ICD-10-CM | POA: Diagnosis not present

## 2021-01-25 DIAGNOSIS — J4540 Moderate persistent asthma, uncomplicated: Secondary | ICD-10-CM

## 2021-01-25 NOTE — Progress Notes (Signed)
[place], [place] [address] Dept: [phone]  FOLLOW UP NOTE  Patient ID: Stephanie Norris, female    DOB: 1955-11-04  Age: 65 y.o. MRN: [id number] Date of Office Visit: 01/25/2021  Assessment  Chief Complaint: Follow-up  HPI Stephanie Norris is a 65 year old female who presents today for follow-up of moderate persistent asthma, chronic rhinitis, and nasal sore.  She was last seen on October 26, 2020 by Althea Charon, FNP.  Since her last office visit she denies any new diagnosis or surgeries.  Moderate persistent asthma is reported as controlled with Breo 200 mcg 1 puff once a day, Singulair 10 mg once a day, and albuterol as needed.  She denies any coughing, wheezing, tightness in her chest, shortness of breath, and nocturnal awakenings due to breathing problems.  She has not had to use her albuterol inhaler since her last office visit.  She is also not made any trips to the emergency room or urgent care due to breathing problems.  She is currently on a round of steroids from her primary care physician that she started yesterday due to a sinus infection.  Other than this current round of steroids she has not received any other systemic steroids.  Chronic rhinitis is reported as not well controlled with azelastine nasal spray 1 spray each nostril twice a day and Xyzal 5 mg once a day.  She reports clear rhinorrhea, nasal congestion, clear postnasal drip, and sinus tenderness that started Sunday.  She denies any fever, chills, sick contacts, and body aches.  She reports that she called her primary care physician's office yesterday and she was prescribed Zithromax and prednisone.  She mentions that Zithromax and prednisone usually help her when she has a sinus infection.  She started both of these medications last night and reports that she did not sleep well.  She reports that she continues to have the sore in her right nostril.  She mentions that she called Dr. Iona Beard  office and was told not to "mess with it for 2 weeks".  She reports that it is hard not to touch it with her being a scab there.  Recommended that she could go for a second opinion.  She verbalizes understanding.   Drug Allergies:  Allergies  Allergen Reactions   Nsaids Anaphylaxis   Bactrim [Sulfamethoxazole-Trimethoprim] Nausea Only    Upset stomach   Cephalexin Rash   Levofloxacin Rash   Macrobid [Nitrofurantoin] Rash    Review of Systems: Review of Systems  Constitutional:  Negative for chills and fever.  HENT:         Reports clear rhinorrhea, nasal congestion, clear postnasal drip, and sinus tenderness  Eyes:        Reports left eye can be watery at times.  Denies itchy eyes  Respiratory:  Negative for cough, shortness of breath and wheezing.   Cardiovascular:  Negative for chest pain and palpitations.  Gastrointestinal:        Denies heartburn or reflux symptoms  Genitourinary:  Negative for frequency.  Musculoskeletal:  Negative for myalgias.  Skin:  Negative for rash.  Neurological:  Positive for headaches.       Reports occasional headache    Physical Exam: BP 130/68 (BP Location: Left Arm, Patient Position: Sitting, Cuff Size: Normal)    Pulse 83    Temp 98 F (36.7 C) (Temporal)    Resp 18    SpO2 98%    Physical Exam Constitutional:  Appearance: Normal appearance.  HENT:     Head: Normocephalic and atraumatic.     Comments: Pharynx normal, eyes normal, ears normal, nose: Bilateral lower turbinates mildly edematous and slightly erythematous with clear drainage noted.  Scabbing noted in right nostril    Right Ear: Tympanic membrane, ear canal and external ear normal.     Left Ear: Tympanic membrane, ear canal and external ear normal.     Mouth/Throat:     Mouth: Mucous membranes are moist.     Pharynx: Oropharynx is clear.  Eyes:     Conjunctiva/sclera: Conjunctivae normal.  Cardiovascular:     Rate and Rhythm: Regular rhythm.     Heart sounds: Normal  heart sounds.  Pulmonary:     Effort: Pulmonary effort is normal.     Breath sounds: Normal breath sounds.     Comments: Lungs clear to auscultation Musculoskeletal:     Cervical back: Neck supple.  Skin:    General: Skin is warm.  Neurological:     Mental Status: She is alert and oriented to person, place, and time.  Psychiatric:        Mood and Affect: Mood normal.        Behavior: Behavior normal.        Thought Content: Thought content normal.        Judgment: Judgment normal.    Diagnostics:  FVC 2.25 L, FEV1 1.62 L.  Predicted FVC 2.43 L, predicted FEV1 1.92 L.  Spirometry indicates normal respiratory function for    Assessment and Plan: 1. Moderate persistent asthma, uncomplicated   2. Chronic rhinitis   3. Nasal sore     No orders of the defined types were placed in this encounter.   Patient Instructions  1.  moderate persistent asthma, uncomplicated-Breztri caused drowsiness and Trelegy not effective  - Daily controller medication(s): Continue Breo 200 mcg 1 puff once a day to help prevent cough and wheeze - continue Singulair (montelukast) 10 mg once a day - Prior to physical activity: albuterol 2 puffs 10-15 minutes before physical activity. - Rescue medications: albuterol 4 puffs every 4-6 hours as needed - Asthma control goals:  * Full participation in all desired activities (may need albuterol before activity) * Albuterol use two time or less a week on average (not counting use with activity) * Cough interfering with sleep two time or less a month * Oral steroids no more than once a year * No hospitalizations  2. Chronic rhinitis -continue prednisone and Zirthromax as per your primary care physician -Continue using azelastine one spray per nostril up to twice daily as needed for runny nose/drainage down throat - Continue with Xyzal (levocetirizine) 5 mg daily.   3. Nasal sore Recommend scheduling a follow up appointment with ENT (Dr. Unice Bailey) Recommend stopping the cream that says for external use only for the sore inside your nose     Schedule a follow up appointment in 4 months or sooner if needed           Return in about 4 months (around 05/26/2021).    Thank you for the opportunity to care for this patient.  Please do not hesitate to contact me with questions.  Althea Charon, FNP [facility]

## 2021-02-03 DIAGNOSIS — I10 Essential (primary) hypertension: Secondary | ICD-10-CM | POA: Diagnosis not present

## 2021-02-03 DIAGNOSIS — E782 Mixed hyperlipidemia: Secondary | ICD-10-CM | POA: Diagnosis not present

## 2021-02-20 NOTE — Progress Notes (Signed)
Office Visit Note  Patient: Stephanie Norris             Date of Birth: 03/02/1955           MRN: [id number]             PCP: Celene Squibb, MD Referring: Celene Squibb, MD Visit Date: 02/21/2021 Occupation: @GUAROCC @  Subjective:  Lower back pain   History of Present Illness: MIKEYA TOMASETTI is a 66 y.o. female with a history of seropositive rheumatoid arthritis, osteoarthritis and fibromyalgia syndrome.  She states her rheumatoid arthritis is well controlled on methotrexate which she has been taking on a regular basis.  She has been experiencing increased pain and discomfort in the lower back which she describes over the left SI joint.  She continues to have some trapezius discomfort, neck and lower back a stiffness.  Activities of Daily Living:  Patient reports morning stiffness for 30 minutes.   Patient Reports nocturnal pain.  Difficulty dressing/grooming: Denies Difficulty climbing stairs: Reports Difficulty getting out of chair: Denies Difficulty using hands for taps, buttons, cutlery, and/or writing: Reports  Review of Systems  Constitutional:  Positive for fatigue.  HENT:  Positive for mouth dryness and nose dryness. Negative for mouth sores.   Eyes:  Negative for pain, itching and dryness.  Respiratory:  Negative for shortness of breath and difficulty breathing.   Cardiovascular:  Negative for chest pain and palpitations.  Gastrointestinal:  Negative for blood in stool, constipation and diarrhea.  Endocrine: Negative for increased urination.  Genitourinary:  Negative for difficulty urinating.  Musculoskeletal:  Positive for joint pain, joint pain, joint swelling, myalgias, morning stiffness, muscle tenderness and myalgias.  Skin:  Negative for color change, rash, redness and sensitivity to sunlight.  Allergic/Immunologic: Negative for susceptible to infections.  Neurological:  Positive for dizziness and headaches. Negative for numbness, memory loss and weakness.   Hematological:  Positive for bruising/bleeding tendency.  Psychiatric/Behavioral:  Positive for depressed mood and sleep disturbance. Negative for confusion. The patient is nervous/anxious.    PMFS History:  Patient Active Problem List   Diagnosis Date Noted   Mucous cyst of digit of left hand 04/07/2020   Vertigo 08/20/2016   Eustachian tube dysfunction 04/17/2016   Abdominal pain 03/02/2016   Gastroenteritis 03/02/2016   Rheumatoid arthritis with rheumatoid factor of multiple sites without organ or systems involvement ([place]) 01/13/2016   HLA B27 (HLA B27 positive) 01/13/2016   Osteoarthritis of foot 01/13/2016   DJD (degenerative joint disease), cervical 01/13/2016   Spondylosis of lumbar region without myelopathy or radiculopathy 01/13/2016   Primary osteoarthritis of both hands 01/13/2016   Non-seasonal allergic rhinitis 08/13/2014   Stiffness of joints, not elsewhere classified, multiple sites 08/26/2013   Flu-like symptoms 03/02/2013   Lactic acidosis 03/02/2013   Sinus tachycardia 03/02/2013   Elevated lactic acid level 03/02/2013   Influenza due to identified novel influenza A virus with other respiratory manifestations 03/02/2013   Hyperglycemia 03/02/2013   Fibromyalgia    OSA (obstructive sleep apnea) 06/22/2011   DOE (dyspnea on exertion) 05/30/2011   Moderate persistent asthma 03/30/2011    Past Medical History:  Diagnosis Date   Asthma    COPD (chronic obstructive pulmonary disease) ([place])    Diabetes mellitus without complication (HCC)    Type 2   Dizziness    Fibromyalgia    GERD (gastroesophageal reflux disease)    Headache    Hyperlipidemia    OSA (obstructive sleep apnea)  Osteoarthritis    PCOS (polycystic ovarian syndrome)    Raynaud disease    Rheumatoid arthritis ([place])     Family History  Problem Relation Age of Onset   Asthma Mother    Rheum arthritis Mother    Allergic rhinitis Mother    Rheum arthritis Sister    Allergic rhinitis Sister     Heart failure Father    Heart disease Father    Rheum arthritis Sister    Allergic rhinitis Sister    Allergic rhinitis Brother    Allergies Other        "Everyone"   Heart disease Maternal Grandfather    Heart disease Maternal Grandmother    Heart disease Paternal Grandfather    Heart disease Paternal Grandmother    Past Surgical History:  Procedure Laterality Date   APPENDECTOMY     BACK SURGERY  2021   CHOLECYSTECTOMY     COLONOSCOPY W/ BIOPSIES  fall 2009   COMBINED HYSTERECTOMY VAGINAL / OOPHORECTOMY / A&P REPAIR  10/2002   endometriosis, cystocele, fibroids   FINGER SURGERY Left 05/09/2020   spurs removed from left index finger   [place] W/ TAH     Social History   Social History Narrative   Lives with husband   Retired    no children   Assoc degree   16 oz caffeine daily   Immunization History  Administered Date(s) Administered   Influenza Whole 10/07/2010, 11/06/2011   Influenza,inj,Quad PF,6+ Mos 11/05/2012, 11/02/2015, 11/22/2016, 11/15/2017   Influenza-Unspecified 11/05/2013   Moderna Sars-Covid-2 Vaccination 04/13/2019, 05/11/2019   Pneumococcal Conjugate-13 11/06/2014   Pneumococcal Polysaccharide-23 11/02/2015     Objective: Vital Signs: BP (!) 143/83 (BP Location: Left Arm, Patient Position: Sitting, Cuff Size: Normal)    Pulse 83    Ht 4' 11.75" (1.518 m)    Wt 126 lb 6.4 oz (57.3 kg)    BMI 24.89 kg/m    Physical Exam Vitals and nursing note reviewed.  Constitutional:      Appearance: She is well-developed.  HENT:     Head: Normocephalic and atraumatic.  Eyes:     Conjunctiva/sclera: Conjunctivae normal.  Cardiovascular:     Rate and Rhythm: Normal rate and regular rhythm.     Heart sounds: Normal heart sounds.  Pulmonary:     Effort: Pulmonary effort is normal.     Breath sounds: Normal breath sounds.  Abdominal:     General: Bowel sounds are normal.     Palpations:  Abdomen is soft.  Musculoskeletal:     Cervical back: Normal range of motion.  Lymphadenopathy:     Cervical: No cervical adenopathy.  Skin:    General: Skin is warm and dry.     Capillary Refill: Capillary refill takes less than 2 seconds.  Neurological:     Mental Status: She is alert and oriented to person, place, and time.  Psychiatric:        Behavior: Behavior normal.     Musculoskeletal Exam: She is some stiffness range of motion of the cervical spine.  She had limited range of motion of her lumbar spine.  She tenderness over left SI joint.  Shoulder joints, elbow joints, wrist joints, MCPs PIPs and DIPs with good range of motion.  She had bilateral DIP thickening with no synovitis.  Hip joints and knee joints with good range of motion.  She had no tenderness over ankles or MTPs.  CDAI Exam: CDAI Score: -- Patient Global: 3 mm; Provider Global: -- Swollen: 0 ; Tender: 1  Joint Exam 02/21/2021      Right  Left  Sacroiliac      Tender     Investigation: No additional findings.  Imaging: No results found.  Recent Labs: Lab Results  Component Value Date   WBC 9.4 11/04/2020   HGB 11.9 11/04/2020   PLT 390 11/04/2020   NA 138 11/04/2020   K 3.9 11/04/2020   CL 107 11/04/2020   CO2 20 11/04/2020   GLUCOSE 234 (H) 11/04/2020   BUN 22 11/04/2020   CREATININE 0.75 11/04/2020   BILITOT 0.3 11/04/2020   ALKPHOS 54 09/10/2016   AST 15 11/04/2020   ALT 25 11/04/2020   PROT 6.7 11/04/2020   ALBUMIN 4.0 09/10/2016   CALCIUM 9.6 11/04/2020   GFRAA 96 07/26/2020    Speciality Comments: No specialty comments available.  Procedures:  Sacroiliac Joint Inj on 02/21/2021 11:14 AM Indications: pain Details: 27 G 1.5 in needle, posterior approach Medications: 1 mL lidocaine 1 %; 40 mg triamcinolone acetonide 40 MG/ML Aspirate: 0 mL Outcome: tolerated well, no immediate complications Procedure, treatment alternatives, risks and benefits explained, specific risks  discussed. Consent was given by the patient. Immediately prior to procedure a time out was called to verify the correct patient, procedure, equipment, support staff and site/side marked as required. Patient was prepped and draped in the usual sterile fashion.    Allergies: Nsaids, Bactrim [sulfamethoxazole-trimethoprim], Cephalexin, Levofloxacin, and Macrobid [nitrofurantoin]   Assessment / Plan:     Visit Diagnoses: Rheumatoid arthritis with rheumatoid factor of multiple sites without organ or systems involvement ([place]) - +CCP: Patient is doing well on methotrexate 6 tablets p.o. weekly.  She denies any joint pain or joint swelling in her hands or feet.  High risk medication use - Methotrexate 6 tablets by mouth every 7 days and folic acid 1 mg 2 tablets daily.  -November 04, 2020 CBC and CMP were normal.  She has been advised to get labs today and then every 3 months to monitor for drug toxicity.  She was also advised to hold methotrexate in case she develops an infection and resume after the infection resolves.  Information regarding mineralizations was placed in the AVS.  Plan: CBC with Differential/Platelet, COMPLETE METABOLIC PANEL WITH GFR  HLA B27 (HLA B27 positive)  Primary osteoarthritis of both hands-she has PIP and DIP thickening with no synovitis.  Primary osteoarthritis of both feet-she continues to have some discomfort in her feet.  No synovitis was noted.  Fibromyalgia-she is having a flare of fibromyalgia with generalized pain and discomfort.  Trapezius muscle spasm-mild tenderness was noted.  Chronic SI joint pain-she complains of ongoing discomfort in her left SI joint.  She had good results with injections in the past.  She states her blood sugar this morning at home was 150.  She requests repeat cortisone injection.  Side effects of cortisone injections were discussed at length.  Per her request left SI joint injection was performed as described above.  Patient tolerated the  procedure well.  Postprocedure instructions were given.  Trochanteric bursitis of left hip-IT band stretches were discussed.  DDD (degenerative disc disease), cervical-she has limited range of motion with some stiffness.  DDD (degenerative disc disease), lumbar - Discectomy performed April 2021 by Dr. Arnoldo Morale.  Chronic pain.  Other medical problems are listed as follows:  Other fatigue  History of diabetes mellitus  History of sleep apnea  History of asthma  History of depression - She remains on Cymbalta 60 mg 1 capsule by mouth daily.  Osteopenia of multiple sites - October 24, 2016 DEXA scan showed T score of -2.3, BMD 0.596 in the left femoral neck.  We will schedule repeat DEXA scan.  Calcium rich diet and use of vitamin D was discussed.  Orders: Orders Placed This Encounter  Procedures   Sacroiliac Joint Inj   DG Bone Density   CBC with Differential/Platelet   COMPLETE METABOLIC PANEL WITH GFR   No orders of the defined types were placed in this encounter.    Follow-Up Instructions: Return in about 3 months (around 05/22/2021) for Rheumatoid arthritis, Osteoarthritis.   Bo Merino, MD  Note - This record has been created using Editor, commissioning.  Chart creation errors have been sought, but may not always  have been located. Such creation errors do not reflect on  the standard of medical care.

## 2021-02-21 ENCOUNTER — Other Ambulatory Visit: Payer: Self-pay

## 2021-02-21 ENCOUNTER — Ambulatory Visit: Payer: PPO | Admitting: Rheumatology

## 2021-02-21 ENCOUNTER — Encounter: Payer: Self-pay | Admitting: Rheumatology

## 2021-02-21 VITALS — BP 143/83 | HR 83 | Ht 59.75 in | Wt 126.4 lb

## 2021-02-21 DIAGNOSIS — M5030 Other cervical disc degeneration, unspecified cervical region: Secondary | ICD-10-CM | POA: Diagnosis not present

## 2021-02-21 DIAGNOSIS — M7062 Trochanteric bursitis, left hip: Secondary | ICD-10-CM | POA: Diagnosis not present

## 2021-02-21 DIAGNOSIS — Z8709 Personal history of other diseases of the respiratory system: Secondary | ICD-10-CM

## 2021-02-21 DIAGNOSIS — M5136 Other intervertebral disc degeneration, lumbar region: Secondary | ICD-10-CM | POA: Diagnosis not present

## 2021-02-21 DIAGNOSIS — M797 Fibromyalgia: Secondary | ICD-10-CM

## 2021-02-21 DIAGNOSIS — M19072 Primary osteoarthritis, left ankle and foot: Secondary | ICD-10-CM

## 2021-02-21 DIAGNOSIS — Z8669 Personal history of other diseases of the nervous system and sense organs: Secondary | ICD-10-CM

## 2021-02-21 DIAGNOSIS — M533 Sacrococcygeal disorders, not elsewhere classified: Secondary | ICD-10-CM

## 2021-02-21 DIAGNOSIS — M19041 Primary osteoarthritis, right hand: Secondary | ICD-10-CM

## 2021-02-21 DIAGNOSIS — M19042 Primary osteoarthritis, left hand: Secondary | ICD-10-CM

## 2021-02-21 DIAGNOSIS — M8589 Other specified disorders of bone density and structure, multiple sites: Secondary | ICD-10-CM

## 2021-02-21 DIAGNOSIS — Z79899 Other long term (current) drug therapy: Secondary | ICD-10-CM | POA: Diagnosis not present

## 2021-02-21 DIAGNOSIS — M19071 Primary osteoarthritis, right ankle and foot: Secondary | ICD-10-CM | POA: Diagnosis not present

## 2021-02-21 DIAGNOSIS — Z8639 Personal history of other endocrine, nutritional and metabolic disease: Secondary | ICD-10-CM

## 2021-02-21 DIAGNOSIS — M62838 Other muscle spasm: Secondary | ICD-10-CM | POA: Diagnosis not present

## 2021-02-21 DIAGNOSIS — R5383 Other fatigue: Secondary | ICD-10-CM | POA: Diagnosis not present

## 2021-02-21 DIAGNOSIS — M0579 Rheumatoid arthritis with rheumatoid factor of multiple sites without organ or systems involvement: Secondary | ICD-10-CM

## 2021-02-21 DIAGNOSIS — Z1589 Genetic susceptibility to other disease: Secondary | ICD-10-CM | POA: Diagnosis not present

## 2021-02-21 DIAGNOSIS — G8929 Other chronic pain: Secondary | ICD-10-CM | POA: Diagnosis not present

## 2021-02-21 DIAGNOSIS — Z8659 Personal history of other mental and behavioral disorders: Secondary | ICD-10-CM

## 2021-02-21 DIAGNOSIS — M51369 Other intervertebral disc degeneration, lumbar region without mention of lumbar back pain or lower extremity pain: Secondary | ICD-10-CM

## 2021-02-21 LAB — COMPLETE METABOLIC PANEL WITH GFR
AG Ratio: 1.7 (calc) (ref 1.0–2.5)
ALT: 30 U/L — ABNORMAL HIGH (ref 6–29)
AST: 20 U/L (ref 10–35)
Albumin: 4.4 g/dL (ref 3.6–5.1)
Alkaline phosphatase (APISO): 69 U/L (ref 37–153)
BUN/Creatinine Ratio: 43 (calc) — ABNORMAL HIGH (ref 6–22)
BUN: 35 mg/dL — ABNORMAL HIGH (ref 7–25)
CO2: 24 mmol/L (ref 20–32)
Calcium: 10.1 mg/dL (ref 8.6–10.4)
Chloride: 104 mmol/L (ref 98–110)
Creat: 0.82 mg/dL (ref 0.50–1.05)
Globulin: 2.6 g/dL (calc) (ref 1.9–3.7)
Glucose, Bld: 179 mg/dL — ABNORMAL HIGH (ref 65–99)
Potassium: 4.1 mmol/L (ref 3.5–5.3)
Sodium: 139 mmol/L (ref 135–146)
Total Bilirubin: 0.3 mg/dL (ref 0.2–1.2)
Total Protein: 7 g/dL (ref 6.1–8.1)
eGFR: 78 mL/min/{1.73_m2} (ref >=60)

## 2021-02-21 LAB — CBC WITH DIFFERENTIAL/PLATELET
Absolute Monocytes: 496 cells/uL (ref 200–950)
Basophils Absolute: 71 cells/uL (ref 0–200)
Basophils Relative: 0.6 %
Eosinophils Absolute: 189 cells/uL (ref 15–500)
Eosinophils Relative: 1.6 %
HCT: 44.5 % (ref 35.0–45.0)
Hemoglobin: 14.7 g/dL (ref 11.7–15.5)
Lymphs Abs: 3245 cells/uL (ref 850–3900)
MCH: 31.1 pg (ref 27.0–33.0)
MCHC: 33 g/dL (ref 32.0–36.0)
MCV: 94.1 fL (ref 80.0–100.0)
MPV: 11.4 fL (ref 7.5–12.5)
Monocytes Relative: 4.2 %
Neutro Abs: 7800 cells/uL (ref 1500–7800)
Neutrophils Relative %: 66.1 %
Platelets: 354 10*3/uL (ref 140–400)
RBC: 4.73 10*6/uL (ref 3.80–5.10)
RDW: 15.2 % — ABNORMAL HIGH (ref 11.0–15.0)
Total Lymphocyte: 27.5 %
WBC: 11.8 10*3/uL — ABNORMAL HIGH (ref 3.8–10.8)

## 2021-02-21 MED ORDER — LIDOCAINE HCL 1 % IJ SOLN
1.0000 mL | INTRAMUSCULAR | Status: AC | PRN
  Administered 2021-02-21: 1 mL

## 2021-02-21 MED ORDER — TRIAMCINOLONE ACETONIDE 40 MG/ML IJ SUSP
40.0000 mg | INTRAMUSCULAR | Status: AC | PRN
  Administered 2021-02-21: 40 mg via INTRA_ARTICULAR

## 2021-02-21 NOTE — Patient Instructions (Signed)
Standing Labs °We placed an order today for your standing lab work.  ° °Please have your standing labs drawn in April and every 3 months ° °If possible, please have your labs drawn 2 weeks prior to your appointment so that the provider can discuss your results at your appointment. ° °Please note that you may see your imaging and lab results in MyChart before we have reviewed them. °We may be awaiting multiple results to interpret others before contacting you. °Please allow our office up to 72 hours to thoroughly review all of the results before contacting the office for clarification of your results. ° °We have open lab daily: °Monday through Thursday from 1:30-4:30 PM and Friday from 1:30-4:00 PM °at the office of Dr. Savan Ruta, [facility].   °Please be advised, all patients with office appointments requiring lab work will take precedent over walk-in lab work.  °If possible, please come for your lab work on Monday and Friday afternoons, as you may experience shorter wait times. °The office is located at [address], [address], [place], [place] [address] °No appointment is necessary.   °Labs are drawn by [facility]. Please bring your co-pay at the time of your lab draw.  You may receive a bill from [facility] for your lab work. ° °Please note if you are on Hydroxychloroquine and and an order has been placed for a Hydroxychloroquine level, you will need to have it drawn 4 hours or more after your last dose. ° °If you wish to have your labs drawn at another location, please call the office 24 hours in advance to send orders. ° °If you have any questions regarding directions or hours of operation,  °please call [phone].   °As a reminder, please drink plenty of water prior to coming for your lab work. Thanks!  °Vaccines °You are taking a medication(s) that can suppress your immune system.  The following immunizations are recommended: °Flu annually °Covid-19  °Td/Tdap (tetanus, diphtheria, pertussis)  every 10 years °Pneumonia (Prevnar 15 then Pneumovax 23 at least 1 year apart.  Alternatively, can take Prevnar 20 without needing additional dose) °Shingrix: 2 doses from 4 weeks to 6 months apart ° °Please check with your PCP to make sure you are up to date.  ° °If you have signs or symptoms of an infection or start antibiotics: °First, call your PCP for workup of your infection. °Hold your medication through the infection, until you complete your antibiotics, and until symptoms resolve if you take the following: °Injectable medication (Actemra, Benlysta, Cimzia, Cosentyx, Enbrel, Humira, Kevzara, Orencia, Remicade, Simponi, Stelara, Taltz, Tremfya) °Methotrexate °Leflunomide (Arava) °Mycophenolate (Cellcept) °Xeljanz, Olumiant, or Rinvoq  °

## 2021-02-22 NOTE — Progress Notes (Signed)
White cell count is mildly elevated and stable.  Glucose is elevated.  Liver function is mildly elevated, most likely due to methotrexate use.  We will continue to monitor labs.  Please advise patient avoid NSAIDs.

## 2021-03-14 IMAGING — DX DG SACRUM/COCCYX 2+V
3 series · 3 of 3 positions shown · non-contrast
Comparison: None.

CLINICAL DATA: Hip pain for 1 week

EXAM:
SACRUM AND COCCYX - 2+ VIEW

[coccyx ap]
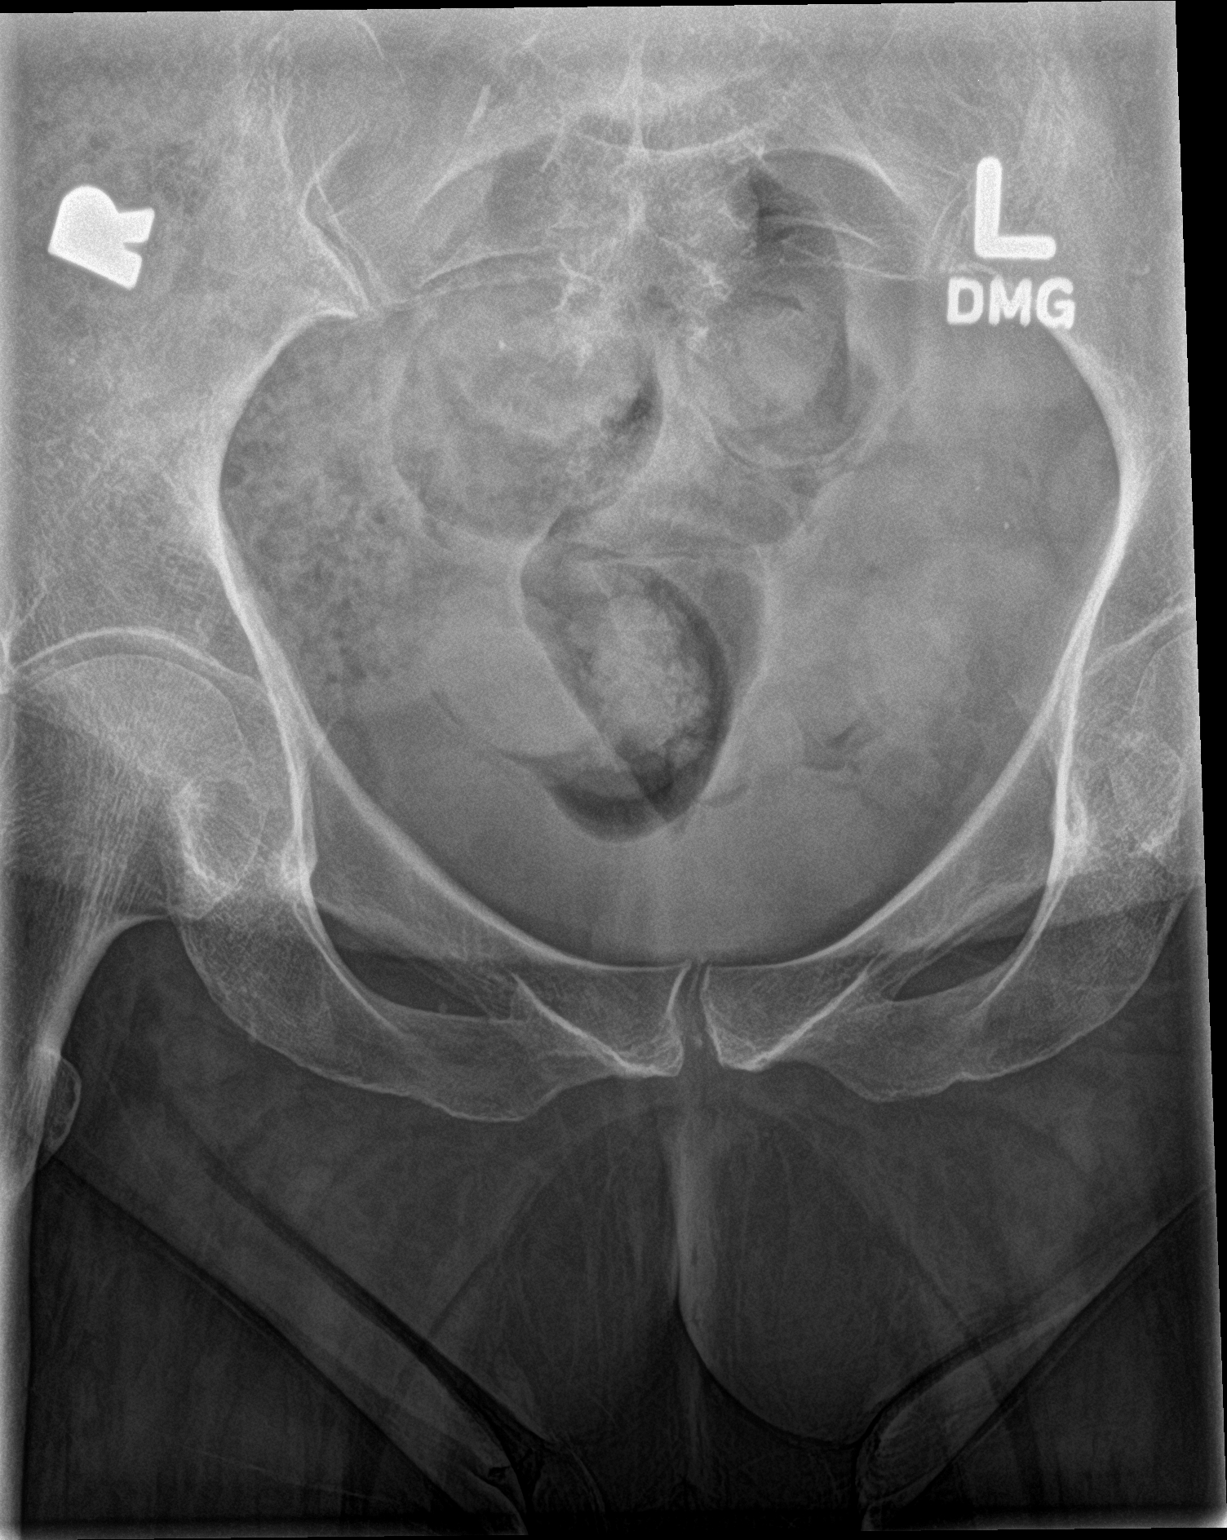

[sacrum ap]
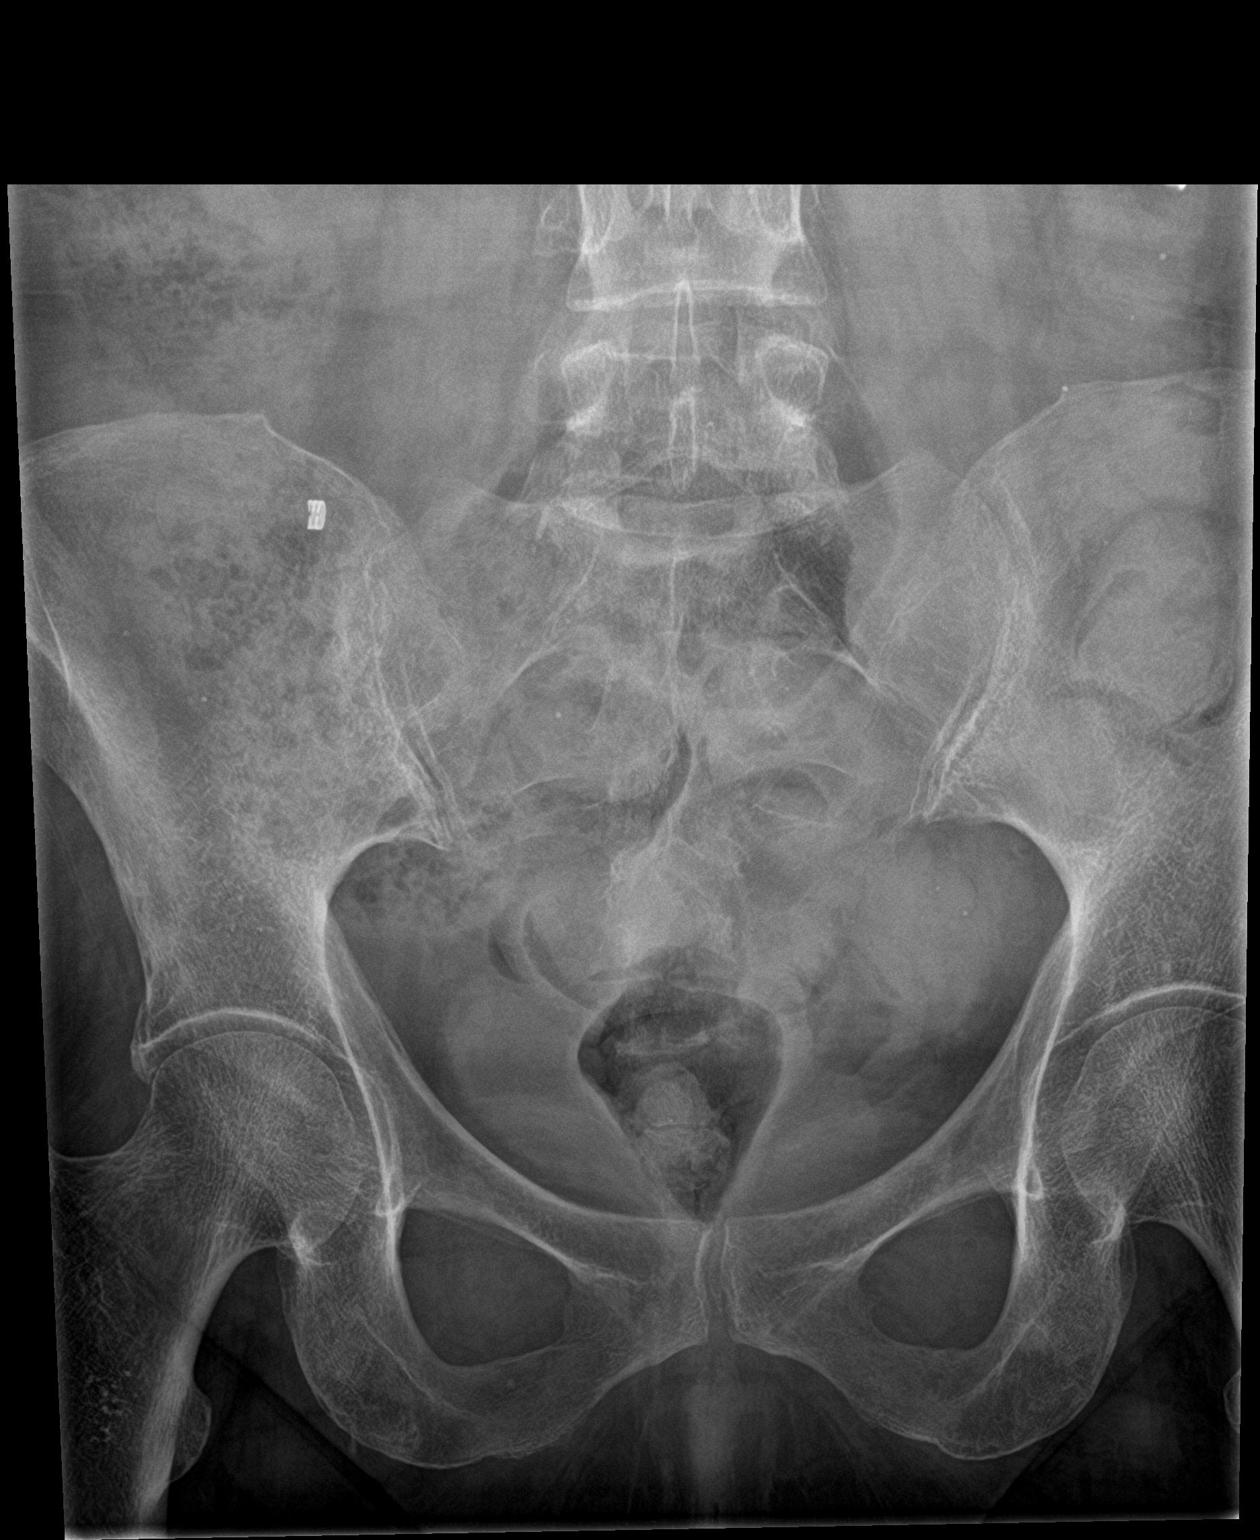

[sacrum lat]
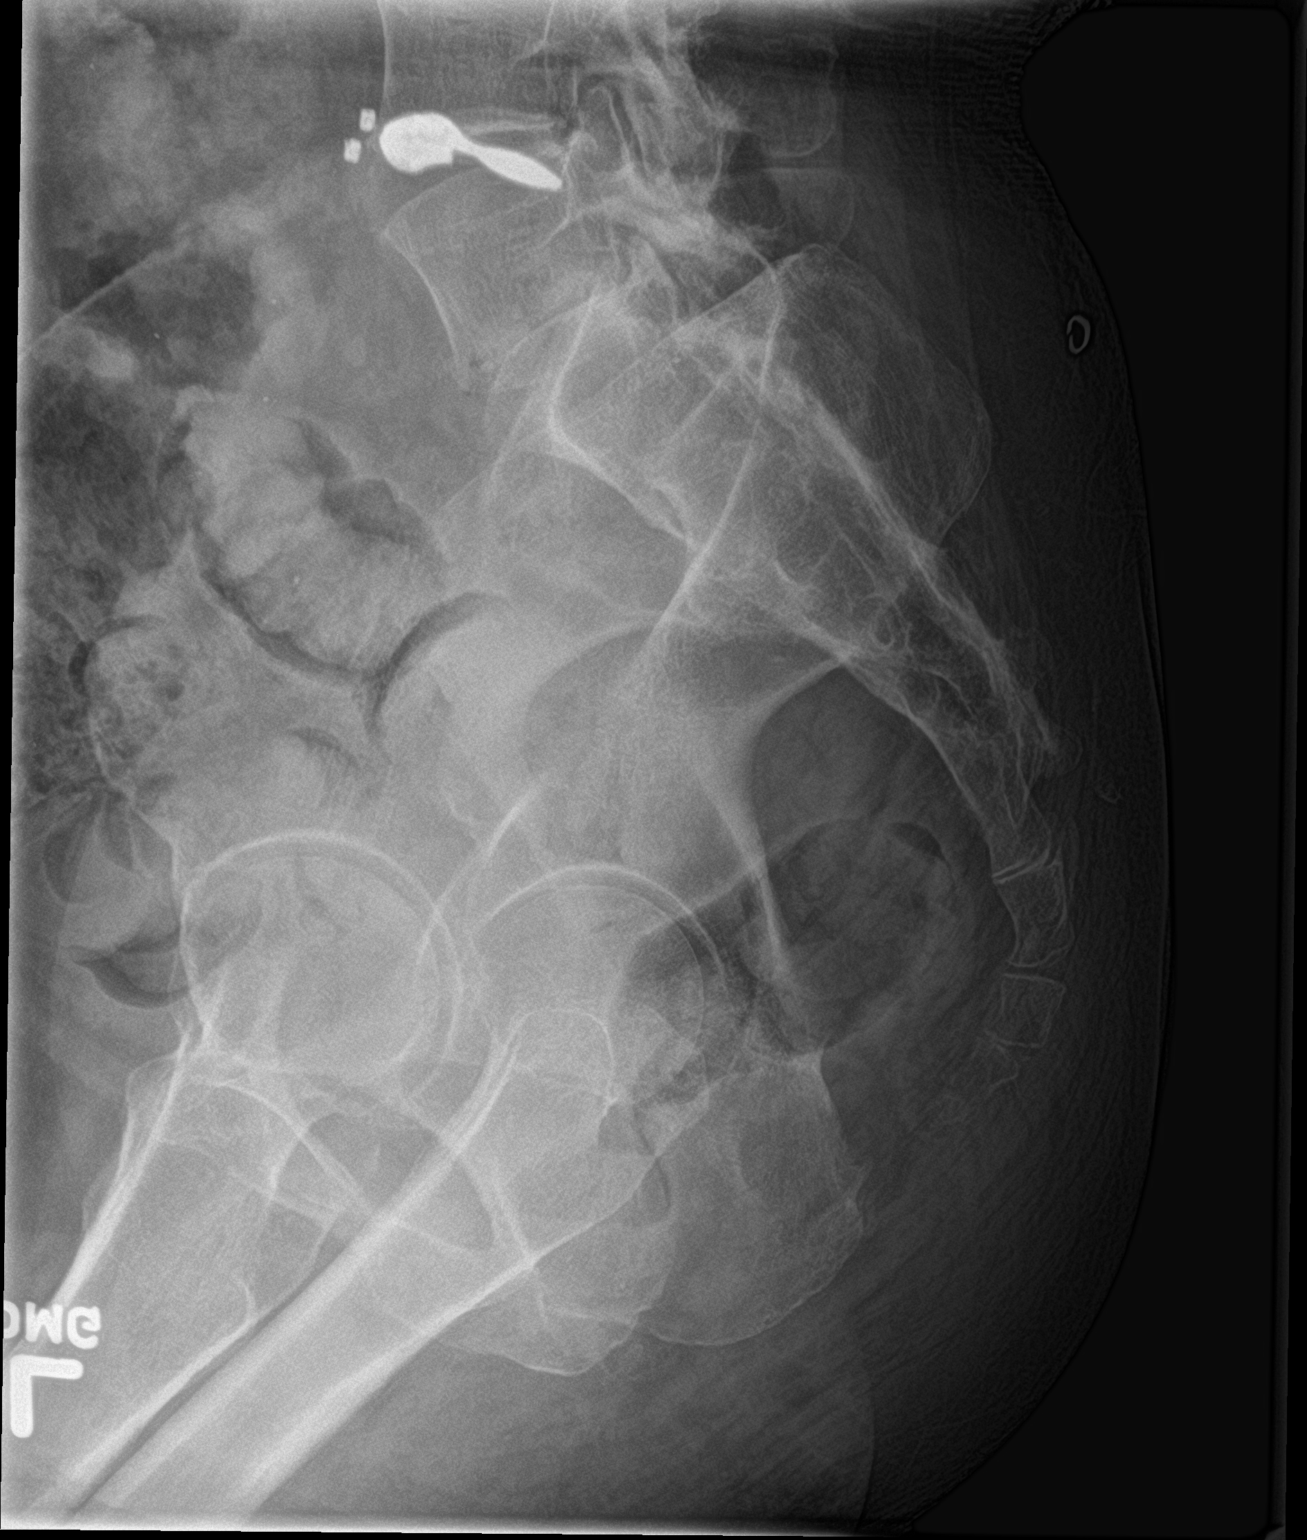

[3 of 3 positions shown; findings below may reference images not displayed]

FINDINGS: Pelvic ring is intact. Sacral ala are unremarkable. Sacroiliac
joints are patent bilaterally. No soft tissue abnormality is noted.
IMPRESSION: No acute abnormality noted.

## 2021-03-14 IMAGING — DX DG HIP (WITH OR WITHOUT PELVIS) 2-3V*L*
3 series · 3 of 3 positions shown · non-contrast
Comparison: None.

CLINICAL DATA: Left hip pain for 1 week.

EXAM:
DG HIP (WITH OR WITHOUT PELVIS) 2-3V LEFT

[pelvis ap]
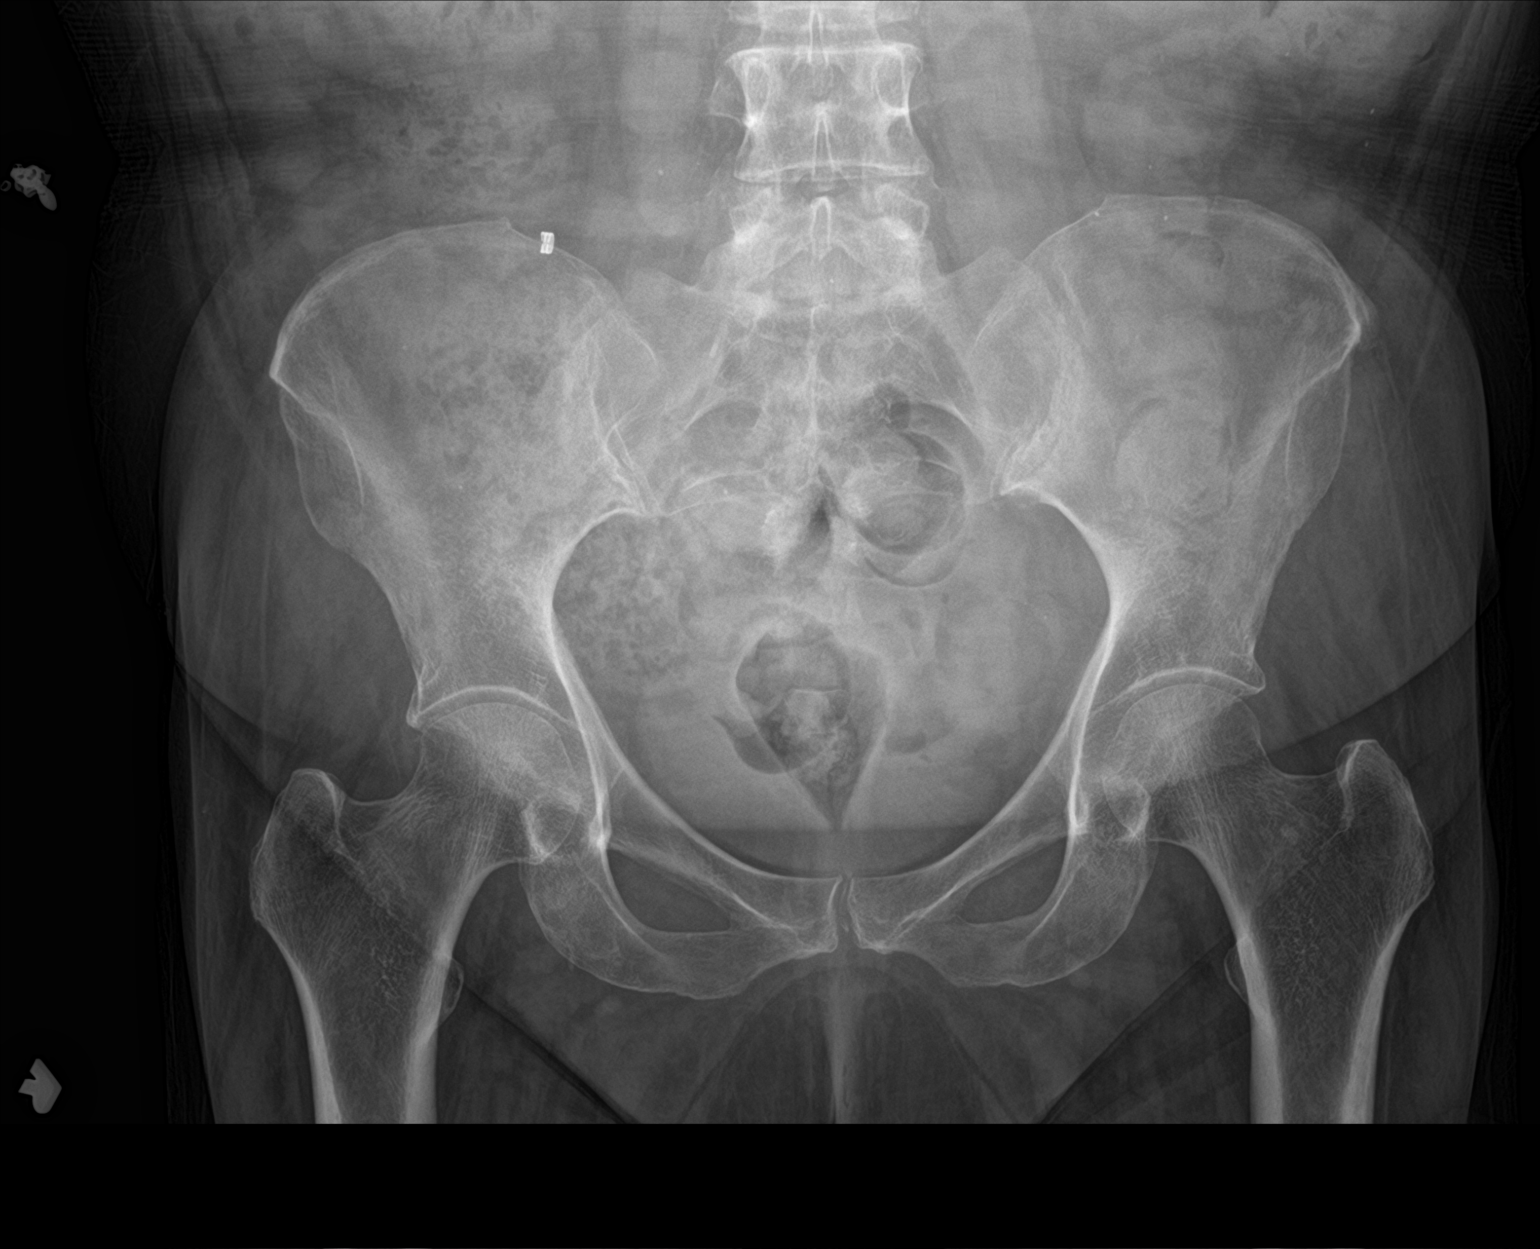

[hip ap]
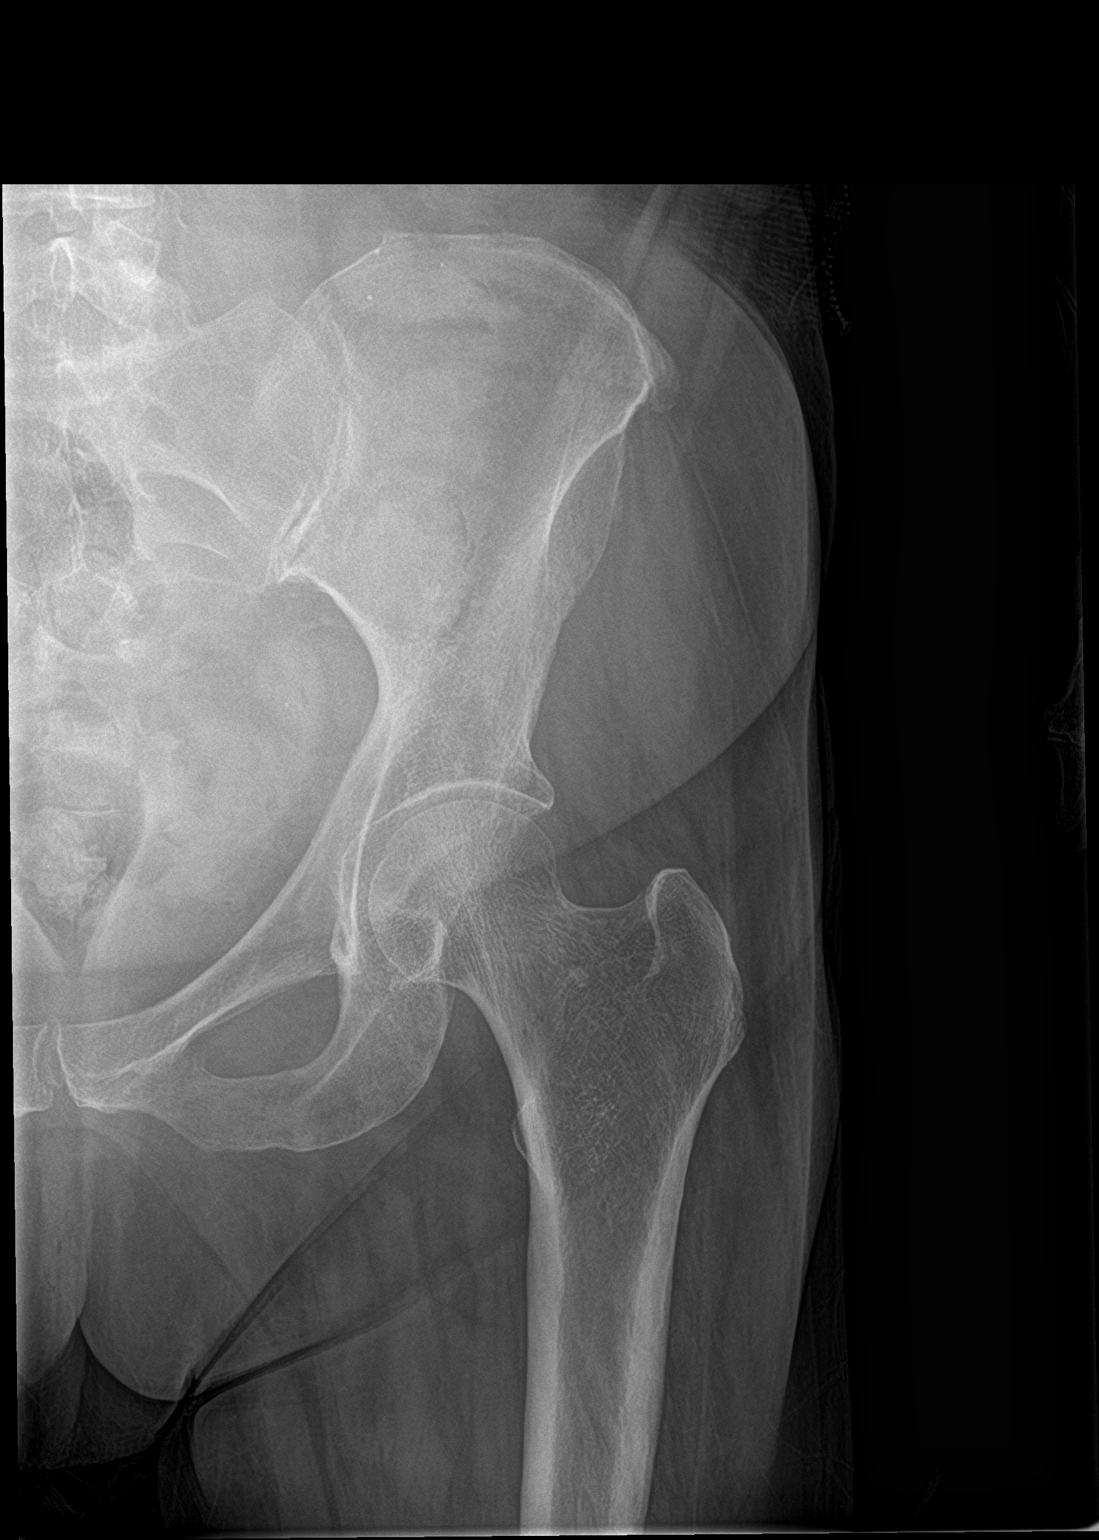

[hip frog leg]
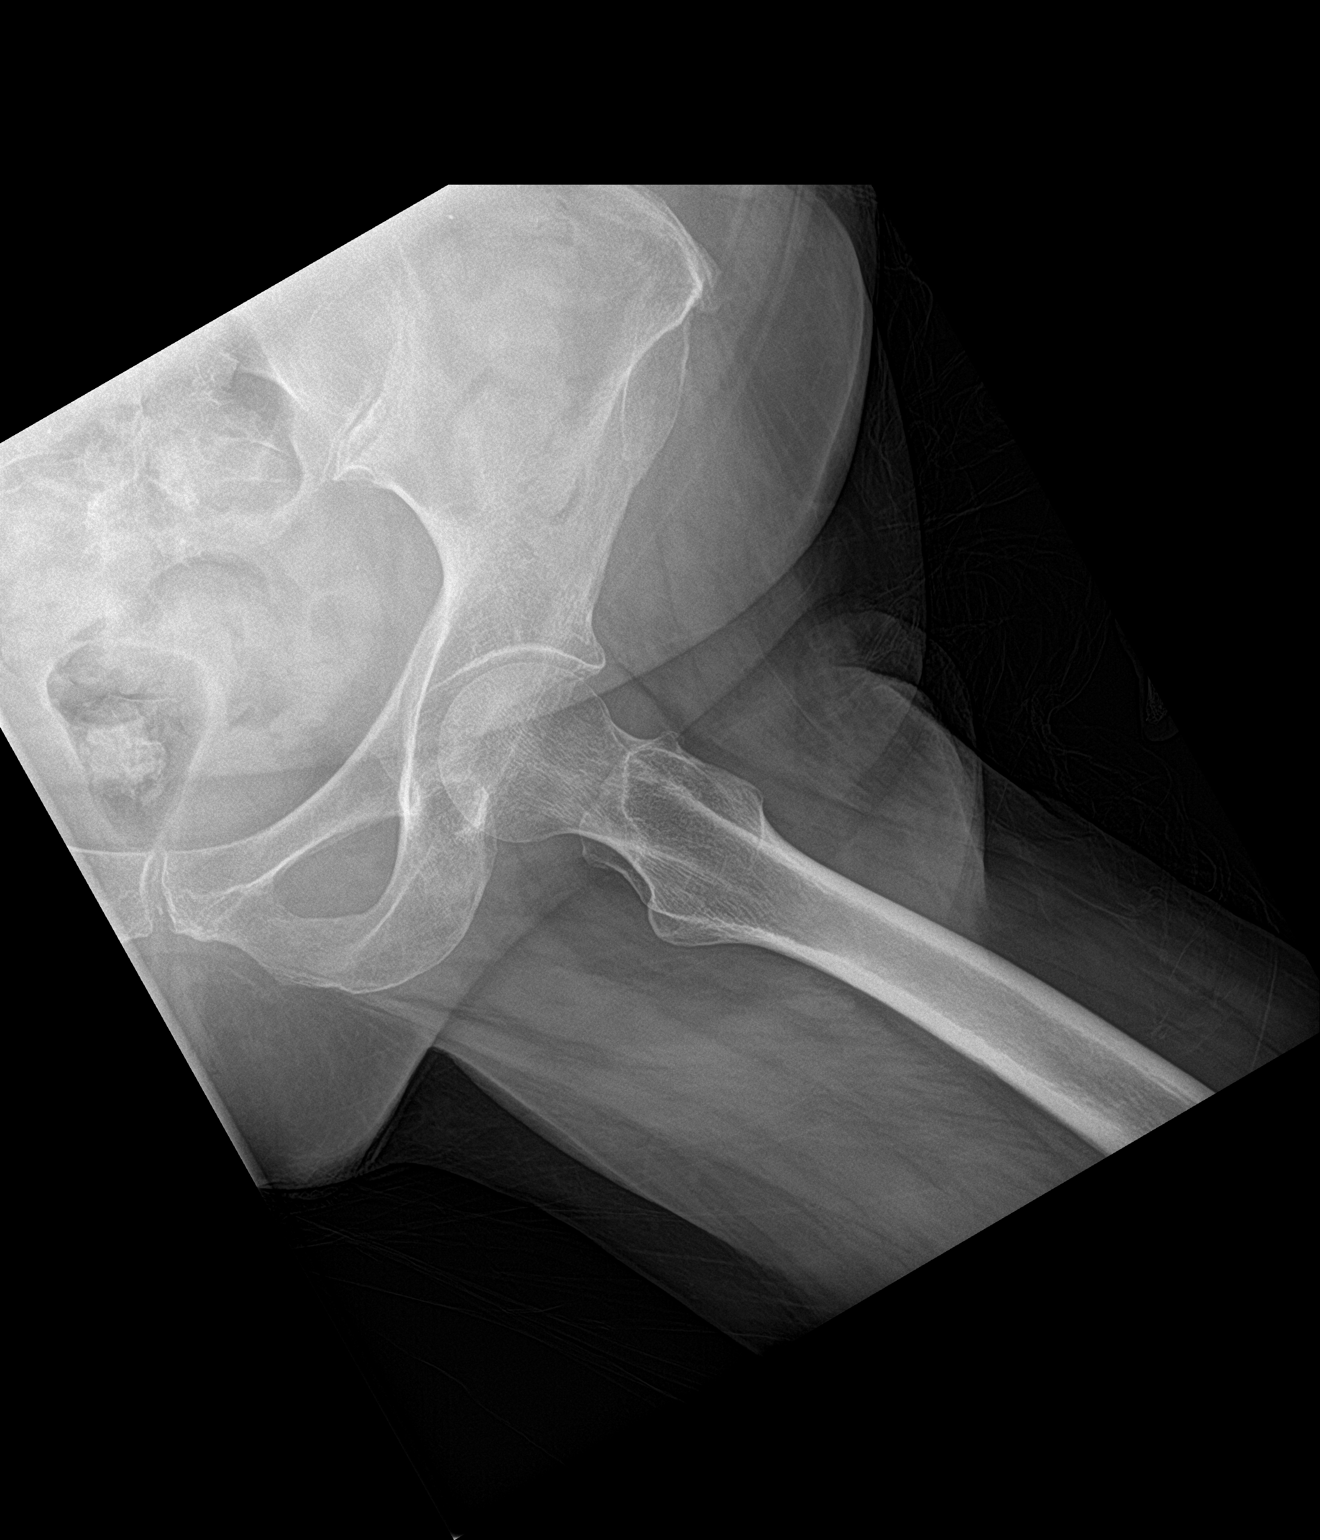

[3 of 3 positions shown; findings below may reference images not displayed]

FINDINGS: The cortical margins of the bony pelvis and left hip are intact. No
fracture. Pubic symphysis and sacroiliac joints are congruent. Both
femoral heads are well-seated in the respective acetabula.
IMPRESSION: Negative radiographs of the pelvis and left hip.

## 2021-03-23 DIAGNOSIS — J069 Acute upper respiratory infection, unspecified: Secondary | ICD-10-CM | POA: Diagnosis not present

## 2021-03-23 DIAGNOSIS — R6883 Chills (without fever): Secondary | ICD-10-CM | POA: Diagnosis not present

## 2021-03-23 DIAGNOSIS — R519 Headache, unspecified: Secondary | ICD-10-CM | POA: Diagnosis not present

## 2021-03-23 DIAGNOSIS — J029 Acute pharyngitis, unspecified: Secondary | ICD-10-CM | POA: Diagnosis not present

## 2021-03-26 ENCOUNTER — Other Ambulatory Visit: Payer: Self-pay | Admitting: Physician Assistant

## 2021-03-27 NOTE — Telephone Encounter (Signed)
Next Visit: 05/22/2021  Last Visit: 02/21/2021  Last Fill: 12/26/2020  DX: Rheumatoid arthritis with rheumatoid factor of multiple sites without organ or systems involvement   Current Dose per office note 02/21/2021: Methotrexate 6 tablets by mouth every 7 days   Labs: 02/21/2021 White cell count is mildly elevated and stable.  Glucose is elevated.  Liver function is mildly elevated, most likely due to methotrexate use.  We will continue to monitor labs  Okay to refill MTX?

## 2021-03-28 DIAGNOSIS — E782 Mixed hyperlipidemia: Secondary | ICD-10-CM | POA: Diagnosis not present

## 2021-03-28 DIAGNOSIS — E039 Hypothyroidism, unspecified: Secondary | ICD-10-CM | POA: Diagnosis not present

## 2021-03-28 DIAGNOSIS — E1142 Type 2 diabetes mellitus with diabetic polyneuropathy: Secondary | ICD-10-CM | POA: Diagnosis not present

## 2021-03-31 ENCOUNTER — Ambulatory Visit: Payer: PPO | Admitting: Rheumatology

## 2021-04-04 DIAGNOSIS — J4540 Moderate persistent asthma, uncomplicated: Secondary | ICD-10-CM | POA: Diagnosis not present

## 2021-04-04 DIAGNOSIS — E1142 Type 2 diabetes mellitus with diabetic polyneuropathy: Secondary | ICD-10-CM | POA: Diagnosis not present

## 2021-04-04 DIAGNOSIS — E782 Mixed hyperlipidemia: Secondary | ICD-10-CM | POA: Diagnosis not present

## 2021-04-04 DIAGNOSIS — M0579 Rheumatoid arthritis with rheumatoid factor of multiple sites without organ or systems involvement: Secondary | ICD-10-CM | POA: Diagnosis not present

## 2021-04-04 DIAGNOSIS — F331 Major depressive disorder, recurrent, moderate: Secondary | ICD-10-CM | POA: Diagnosis not present

## 2021-04-04 DIAGNOSIS — K219 Gastro-esophageal reflux disease without esophagitis: Secondary | ICD-10-CM | POA: Diagnosis not present

## 2021-04-04 DIAGNOSIS — J302 Other seasonal allergic rhinitis: Secondary | ICD-10-CM | POA: Diagnosis not present

## 2021-04-04 DIAGNOSIS — I10 Essential (primary) hypertension: Secondary | ICD-10-CM | POA: Diagnosis not present

## 2021-04-04 DIAGNOSIS — Z23 Encounter for immunization: Secondary | ICD-10-CM | POA: Diagnosis not present

## 2021-04-04 DIAGNOSIS — G4733 Obstructive sleep apnea (adult) (pediatric): Secondary | ICD-10-CM | POA: Diagnosis not present

## 2021-04-04 DIAGNOSIS — G9009 Other idiopathic peripheral autonomic neuropathy: Secondary | ICD-10-CM | POA: Diagnosis not present

## 2021-04-04 DIAGNOSIS — E039 Hypothyroidism, unspecified: Secondary | ICD-10-CM | POA: Diagnosis not present

## 2021-04-09 ENCOUNTER — Other Ambulatory Visit: Payer: Self-pay | Admitting: Family

## 2021-04-10 NOTE — Telephone Encounter (Signed)
Ok to send 90 day supply of montelukast 10 mg once a day with no refills.

## 2021-04-21 DIAGNOSIS — R519 Headache, unspecified: Secondary | ICD-10-CM | POA: Diagnosis not present

## 2021-05-09 NOTE — Progress Notes (Signed)
? ?Office Visit Note ? ?Patient: Stephanie Norris             ?Date of Birth: 1955-06-16           ?MRN: [id number]             ?PCP: Celene Squibb, MD ?Referring: Celene Squibb, MD ?Visit Date: 05/22/2021 ?Occupation: @GUAROCC @ ? ?Subjective:  ?Generalized pain  ? ?History of Present Illness: Stephanie Norris is a 66 y.o. female with history of seropositive rheumatoid arthritis, osteoarthritis, fibromyalgia, and DDD.  Patient remains on methotrexate 6 tablets by mouth once weekly and folic acid 2 mg daily.  She continues to tolerate methotrexate without any side effects and has not missed any doses recently.  She states that she was evaluated at urgent care on 05/20/2021 due to experiencing left great toe pain.  She states that she stepped on a cat toy recently but is unsure if this exacerbated her symptoms.  She was started on a course of prednisone due to the concern for a rheumatoid arthritis flare.  She has not been experiencing any other signs or symptoms of a rheumatoid arthritis flare.  She denies any history of gout.  She states her symptoms have improved since starting on the course of prednisone.  She experiences intermittent pain in both index fingers due to underlying osteoarthritis. ?She continues to experience generalized aching due to fibromyalgia.  She has had a rough winter with fibromyalgia flares frequently.  She is having trapezius muscle tension and tenderness bilaterally.  She experiences intermittent discomfort in both SI joints.  She has been having difficulty sleeping at night due to insomnia.  She has been taking melatonin at bedtime at least 1 hour before trying to fall asleep but continues to have difficulty with sleep onset.  She has also had increased fatigue recently.  She states that she was sleeping better and her energy level was better when she was able to work out 3 days a week.  She is hoping to increase her exercise regimen as tolerated. ?She has not had any recent infections.    ? ? ?Activities of Daily Living:  ?Patient reports morning stiffness for a few minutes.   ?Patient Denies nocturnal pain.  ?Difficulty dressing/grooming: Denies ?Difficulty climbing stairs: Reports ?Difficulty getting out of chair: Denies ?Difficulty using hands for taps, buttons, cutlery, and/or writing: Reports ? ?Review of Systems  ?Constitutional:  Positive for fatigue.  ?HENT:  Positive for mouth dryness and nose dryness. Negative for mouth sores.   ?     Nose sore  ?Eyes:  Positive for dryness. Negative for pain and itching.  ?Respiratory:  Negative for shortness of breath and difficulty breathing.   ?Cardiovascular:  Negative for chest pain and palpitations.  ?Gastrointestinal:  Negative for blood in stool, constipation and diarrhea.  ?Endocrine: Negative for increased urination.  ?Genitourinary:  Negative for difficulty urinating.  ?Musculoskeletal:  Positive for joint pain, joint pain, joint swelling, myalgias, morning stiffness, muscle tenderness and myalgias.  ?Skin:  Positive for color change. Negative for rash and redness.  ?Allergic/Immunologic: Positive for susceptible to infections.  ?Neurological:  Positive for numbness, headaches, memory loss and weakness. Negative for dizziness.  ?Hematological:  Positive for bruising/bleeding tendency.  ?Psychiatric/Behavioral:  Negative for confusion.   ? ?PMFS History:  ?Patient Active Problem List  ? Diagnosis Date Noted  ? Mucous cyst of digit of left hand 04/07/2020  ? Vertigo 08/20/2016  ? Eustachian tube dysfunction 04/17/2016  ? Abdominal pain  03/02/2016  ? Gastroenteritis 03/02/2016  ? Rheumatoid arthritis with rheumatoid factor of multiple sites without organ or systems involvement ([place]) 01/13/2016  ? HLA B27 (HLA B27 positive) 01/13/2016  ? Osteoarthritis of foot 01/13/2016  ? DJD (degenerative joint disease), cervical 01/13/2016  ? Spondylosis of lumbar region without myelopathy or radiculopathy 01/13/2016  ? Primary osteoarthritis of both hands  01/13/2016  ? Non-seasonal allergic rhinitis 08/13/2014  ? Stiffness of joints, not elsewhere classified, multiple sites 08/26/2013  ? Flu-like symptoms 03/02/2013  ? Lactic acidosis 03/02/2013  ? Sinus tachycardia 03/02/2013  ? Elevated lactic acid level 03/02/2013  ? Influenza due to identified novel influenza A virus with other respiratory manifestations 03/02/2013  ? Hyperglycemia 03/02/2013  ? Fibromyalgia   ? OSA (obstructive sleep apnea) 06/22/2011  ? DOE (dyspnea on exertion) 05/30/2011  ? Moderate persistent asthma 03/30/2011  ?  ?Past Medical History:  ?Diagnosis Date  ? Asthma   ? COPD (chronic obstructive pulmonary disease) ([place])   ? Diabetes mellitus without complication ([place])   ? Type 2  ? Dizziness   ? Fibromyalgia   ? GERD (gastroesophageal reflux disease)   ? Headache   ? Hyperlipidemia   ? OSA (obstructive sleep apnea)   ? Osteoarthritis   ? PCOS (polycystic ovarian syndrome)   ? Raynaud disease   ? Rheumatoid arthritis ([place])   ?  ?Family History  ?Problem Relation Age of Onset  ? Asthma Mother   ? Rheum arthritis Mother   ? Allergic rhinitis Mother   ? Rheum arthritis Sister   ? Allergic rhinitis Sister   ? Heart failure Father   ? Heart disease Father   ? Rheum arthritis Sister   ? Allergic rhinitis Sister   ? Allergic rhinitis Brother   ? Allergies Other   ?     "Everyone"  ? Heart disease Maternal Grandfather   ? Heart disease Maternal Grandmother   ? Heart disease Paternal Grandfather   ? Heart disease Paternal Grandmother   ? ?Past Surgical History:  ?Procedure Laterality Date  ? APPENDECTOMY    ? BACK SURGERY  2021  ? CHOLECYSTECTOMY    ? COLONOSCOPY W/ BIOPSIES  fall 2009  ? COMBINED HYSTERECTOMY VAGINAL / OOPHORECTOMY / A&P REPAIR  10/2002  ? endometriosis, cystocele, fibroids  ? FINGER SURGERY Left 05/09/2020  ? spurs removed from left index finger  ? KNEE SURGERY  1996  ? TOE SURGERY    ? VESICOVAGINAL FISTULA CLOSURE W/ TAH    ? ?Social History  ? ?Social History Narrative  ? Lives with  husband  ? Retired  ?  no children  ? Assoc degree  ? 16 oz caffeine daily  ? ?Immunization History  ?Administered Date(s) Administered  ? Influenza Whole 10/07/2010, 11/06/2011  ? Influenza,inj,Quad PF,6+ Mos 11/05/2012, 11/02/2015, 11/22/2016, 11/15/2017  ? Influenza-Unspecified 11/05/2013  ? Moderna Sars-Covid-2 Vaccination 04/13/2019, 05/11/2019  ? Pneumococcal Conjugate-13 11/06/2014  ? Pneumococcal Polysaccharide-23 11/02/2015  ?  ? ?Objective: ?Vital Signs: BP (!) 146/83 (BP Location: Left Arm, Patient Position: Sitting, Cuff Size: Small)   Pulse 85   Resp 12   Ht 4' 11.5" (1.511 m)   Wt 125 lb 9.6 oz (57 kg)   BMI 24.94 kg/m?   ? ?Physical Exam ?Vitals and nursing note reviewed.  ?Constitutional:   ?   Appearance: She is well-developed.  ?HENT:  ?   Head: Normocephalic and atraumatic.  ?Eyes:  ?   Conjunctiva/sclera: Conjunctivae normal.  ?Cardiovascular:  ?   Rate  and Rhythm: Normal rate and regular rhythm.  ?   Heart sounds: Normal heart sounds.  ?Pulmonary:  ?   Effort: Pulmonary effort is normal.  ?   Breath sounds: Normal breath sounds.  ?Abdominal:  ?   General: Bowel sounds are normal.  ?   Palpations: Abdomen is soft.  ?Musculoskeletal:  ?   Cervical back: Normal range of motion.  ?Skin: ?   General: Skin is warm and dry.  ?   Capillary Refill: Capillary refill takes less than 2 seconds.  ?Neurological:  ?   Mental Status: She is alert and oriented to person, place, and time.  ?Psychiatric:     ?   Behavior: Behavior normal.  ?  ? ?Musculoskeletal Exam: C-spine has limited range of motion with lateral [place].  Trapezius muscle tension and tenderness bilaterally.  Shoulder joints, elbow joints, wrist joints, MCPs, PIPs, DIPs have good range of motion with no synovitis.  She has tenderness and prominence over the index DIP joints bilaterally.  Complete fist formation noted.  Hip joints have good range of motion with no groin pain currently.  No tenderness palpation over trochanteric bursa at this  time.  Both knee joints have good range of motion with crepitus bilaterally.  No warmth or effusion of knee joints noted.  Ankle joints have good range of motion with no tenderness or joint swelling.  She has tende

## 2021-05-20 ENCOUNTER — Ambulatory Visit
Admission: EM | Admit: 2021-05-20 | Discharge: 2021-05-20 | Disposition: A | Discharge status: Home / Self Care | Payer: PPO | Attending: Urgent Care | Admitting: Urgent Care

## 2021-05-20 DIAGNOSIS — M069 Rheumatoid arthritis, unspecified: Secondary | ICD-10-CM | POA: Diagnosis not present

## 2021-05-20 DIAGNOSIS — M79672 Pain in left foot: Secondary | ICD-10-CM

## 2021-05-20 DIAGNOSIS — M79675 Pain in left toe(s): Secondary | ICD-10-CM | POA: Diagnosis not present

## 2021-05-20 MED ORDER — DOXYCYCLINE HYCLATE 100 MG PO CAPS: 100.0000 mg | ORAL_CAPSULE | Freq: Two times a day (BID) | ORAL | 0 refills | Status: DC

## 2021-05-20 MED ORDER — PREDNISONE 20 MG PO TABS: ORAL_TABLET | ORAL | 0 refills | Status: DC

## 2021-05-20 NOTE — ED Provider Notes (Signed)
?[place] ? ? ?MRN: [id number] DOB: 1955/10/09 ? ?Subjective:  ? ?Stephanie Norris is a 66 y.o. female presenting for 3 day history of acute onset persistent left great toe pain, swelling and redness.  No fall, trauma, history of gout.  She does have a history of type 2 diabetes treated without insulin and also has rheumatoid arthritis treated with methotrexate. ? ?No current facility-administered medications for this encounter. ? ?Current Outpatient Medications:  ?  methotrexate (RHEUMATREX) 2.5 MG tablet, TAKE 6 TABLETS BY MOUTH 1 TIME A WEEK, Disp: 72 tablet, Rfl: 0 ?  albuterol (VENTOLIN HFA) 108 (90 Base) MCG/ACT inhaler, Inhale 2 puffs into the lungs every 4 (four) hours as needed for wheezing or shortness of breath., Disp: 18 g, Rfl: 1 ?  ARMOUR THYROID 15 MG tablet, Take 15 mg by mouth daily. (Patient not taking: Reported on 02/21/2021), Disp: , Rfl:  ?  azelastine (ASTELIN) 0.1 % nasal spray, Place 1 spray in each nostril twice a day as needed for runny drippy nose, Disp: 30 mL, Rfl: 5 ?  BREZTRI AEROSPHERE 160-9-4.8 MCG/ACT AERO, , Disp: , Rfl:  ?  Budeson-Glycopyrrol-Formoterol (BREZTRI AEROSPHERE) 160-9-4.8 MCG/ACT AERO, Inhale 2 puffs into the lungs 2 (two) times daily. With spacer. rinse mouth after use. (Patient not taking: Reported on 02/21/2021), Disp: 10.7 g, Rfl: 5 ?  clobetasol ointment (TEMOVATE) 0.05 %, as needed., Disp: , Rfl:  ?  Continuous Blood Gluc Sensor ([place]) MISC, as needed., Disp: , Rfl: 3 ?  diclofenac sodium (VOLTAREN) 1 % GEL, Apply 3 grams to three large joints up to three times daily as needed, Disp: 3 Tube, Rfl: 3 ?  DULoxetine (CYMBALTA) 60 MG capsule, TAKE 1 CAPSULE BY MOUTH EVERY DAY, Disp: 90 capsule, Rfl: 0 ?  fluticasone furoate-vilanterol (BREO ELLIPTA) 200-25 MCG/INH AEPB, Inhale 1 puff into the lungs daily., Disp: 1 each, Rfl: 3 ?  furosemide (LASIX) 20 MG tablet, furosemide 20 mg tablet  TAKE 1 TABLET BY MOUTH EVERY DAY AS  NEEDED, Disp: , Rfl:  ?  glipiZIDE (GLUCOTROL) 5 MG tablet, Take 5 mg by mouth daily., Disp: , Rfl:  ?  levocetirizine (XYZAL) 5 MG tablet, Take 1 tablet (5 mg total) by mouth every evening., Disp: 30 tablet, Rfl: 5 ?  liothyronine (CYTOMEL) 5 MCG tablet, Take 5 mcg by mouth daily. (Patient not taking: Reported on 02/21/2021), Disp: , Rfl:  ?  metFORMIN (GLUCOPHAGE-XR) 500 MG 24 hr tablet, Take 1,000 mg by mouth 2 (two) times daily. , Disp: , Rfl:  ?  montelukast (SINGULAIR) 10 MG tablet, TAKE 1 TABLET(10 MG) BY MOUTH AT BEDTIME, Disp: 90 tablet, Rfl: 0 ?  Multiple Vitamins-Minerals (MULTIVITAMIN PO), Take by mouth., Disp: , Rfl:  ?  omega-3 acid ethyl esters (LOVAZA) 1 g capsule, Take 2 capsules by mouth 2 (two) times daily., Disp: , Rfl:  ?  ONETOUCH VERIO test strip, , Disp: , Rfl:  ?  pantoprazole (PROTONIX) 40 MG tablet, Take 1 tablet by mouth 2 (two) times daily. , Disp: , Rfl: 5 ?  potassium chloride (KLOR-CON) 10 MEQ tablet, potassium chloride ER 10 mEq tablet,extended release  TAKE 1 TABLET BY MOUTH EVERY DAY AS NEEDED (Patient not taking: Reported on 02/21/2021), Disp: , Rfl:  ?  progesterone (PROMETRIUM) 100 MG capsule, Take 100 mg by mouth at bedtime., Disp: , Rfl:  ?  Spacer/Aero-Holding Chambers DEVI, Take 1 each by mouth as directed., Disp: 1 each, Rfl: 0 ?  testosterone (ANDROGEL)  50 MG/5GM (1%) GEL, Place 5 g onto the skin daily., Disp: , Rfl:  ?  valACYclovir (VALTREX) 1000 MG tablet, SMARTSIG:2 Tablet(s) By Mouth Every 12 Hours PRN, Disp: , Rfl:   ? ?Allergies  ?Allergen Reactions  ? Nsaids Anaphylaxis  ? Bactrim [Sulfamethoxazole-Trimethoprim] Nausea Only  ?  Upset stomach  ? Cephalexin Rash  ? Levofloxacin Rash  ? Macrobid [Nitrofurantoin] Rash  ? ? ?Past Medical History:  ?Diagnosis Date  ? Asthma   ? COPD (chronic obstructive pulmonary disease) ([place])   ? Diabetes mellitus without complication ([place])   ? Type 2  ? Dizziness   ? Fibromyalgia   ? GERD (gastroesophageal reflux disease)   ? Headache    ? Hyperlipidemia   ? OSA (obstructive sleep apnea)   ? Osteoarthritis   ? PCOS (polycystic ovarian syndrome)   ? Raynaud disease   ? Rheumatoid arthritis ([place])   ?  ? ?Past Surgical History:  ?Procedure Laterality Date  ? APPENDECTOMY    ? BACK SURGERY  2021  ? CHOLECYSTECTOMY    ? COLONOSCOPY W/ BIOPSIES  fall 2009  ? COMBINED HYSTERECTOMY VAGINAL / OOPHORECTOMY / A&P REPAIR  10/2002  ? endometriosis, cystocele, fibroids  ? FINGER SURGERY Left 05/09/2020  ? spurs removed from left index finger  ? KNEE SURGERY  1996  ? TOE SURGERY    ? VESICOVAGINAL FISTULA CLOSURE W/ TAH    ? ? ?Family History  ?Problem Relation Age of Onset  ? Asthma Mother   ? Rheum arthritis Mother   ? Allergic rhinitis Mother   ? Rheum arthritis Sister   ? Allergic rhinitis Sister   ? Heart failure Father   ? Heart disease Father   ? Rheum arthritis Sister   ? Allergic rhinitis Sister   ? Allergic rhinitis Brother   ? Allergies Other   ?     "Everyone"  ? Heart disease Maternal Grandfather   ? Heart disease Maternal Grandmother   ? Heart disease Paternal Grandfather   ? Heart disease Paternal Grandmother   ? ? ?Social History  ? ?Tobacco Use  ? Smoking status: Never  ? Smokeless tobacco: Never  ?Vaping Use  ? Vaping Use: Never used  ?Substance Use Topics  ? Alcohol use: No  ? Drug use: No  ? ? ?ROS ? ? ?Objective:  ? ?Vitals: ?BP 128/82   Pulse 87   Temp 97.9 ?F (36.6 ?C)   Resp 20   SpO2 96%  ? ?Physical Exam ?Constitutional:   ?   General: She is not in acute distress. ?   Appearance: Normal appearance. She is well-developed. She is not ill-appearing, toxic-appearing or diaphoretic.  ?HENT:  ?   Head: Normocephalic and atraumatic.  ?   Nose: Nose normal.  ?   Mouth/Throat:  ?   Mouth: Mucous membranes are moist.  ?Eyes:  ?   General: No scleral icterus.    ?   Right eye: No discharge.     ?   Left eye: No discharge.  ?   Extraocular Movements: Extraocular movements intact.  ?Cardiovascular:  ?   Rate and Rhythm: Normal rate.  ?Pulmonary:   ?   Effort: Pulmonary effort is normal.  ?Musculoskeletal:  ?     Feet: ? ?Skin: ?   General: Skin is warm and dry.  ?Neurological:  ?   General: No focal deficit present.  ?   Mental Status: She is alert and oriented to person, place, and time.  ?  Psychiatric:     ?   Mood and Affect: Mood normal.     ?   Behavior: Behavior normal.  ? ? ? ? ? ? ? ?Assessment and Plan :  ? ?PDMP not reviewed this encounter. ? ?1. Great toe pain, left   ?2. Left foot pain   ?3. Rheumatoid arthritis, involving unspecified site, unspecified whether rheumatoid factor present ([place])   ? ?My primary suspicion is of rheumatoid arthritis flare and therefore will use an oral prednisone course for this.  But as her immunity is affected by both rheumatoid from methotrexate use and her diabetes I did print a prescription for her for doxycycline in the event she has no improvement in the next 48 hours.  Follow-up with PCP.  Counseled patient on potential for adverse effects with medications prescribed/recommended today, ER and return-to-clinic precautions discussed, patient verbalized understanding. ? ?  ?Jaynee Eagles, PA-C ?05/20/21 1535 ? ?

## 2021-05-20 NOTE — Discharge Instructions (Addendum)
I would like to address your left foot pain, swelling for an inflammatory process related to your rheumatoid arthritis. Start taking prednisone for the next 5 days. However, since your immune system is lowered by the use of methotrexate, you might also have an infection. If there is no improvement in the next 48 hours and you still have pain and redness, start taking doxycycline, an antibiotic that can address infection such as cellulitis.  ?

## 2021-05-20 NOTE — ED Triage Notes (Signed)
Pt presents with left foot swelling and redness that is painful  ?

## 2021-05-22 ENCOUNTER — Ambulatory Visit: Payer: PPO | Admitting: Physician Assistant

## 2021-05-22 ENCOUNTER — Encounter: Payer: Self-pay | Admitting: Physician Assistant

## 2021-05-22 VITALS — BP 146/83 | HR 85 | Resp 12 | Ht 59.5 in | Wt 125.6 lb

## 2021-05-22 DIAGNOSIS — M19041 Primary osteoarthritis, right hand: Secondary | ICD-10-CM | POA: Diagnosis not present

## 2021-05-22 DIAGNOSIS — Z8669 Personal history of other diseases of the nervous system and sense organs: Secondary | ICD-10-CM

## 2021-05-22 DIAGNOSIS — M62838 Other muscle spasm: Secondary | ICD-10-CM

## 2021-05-22 DIAGNOSIS — M5136 Other intervertebral disc degeneration, lumbar region: Secondary | ICD-10-CM

## 2021-05-22 DIAGNOSIS — Z79899 Other long term (current) drug therapy: Secondary | ICD-10-CM

## 2021-05-22 DIAGNOSIS — M5030 Other cervical disc degeneration, unspecified cervical region: Secondary | ICD-10-CM | POA: Diagnosis not present

## 2021-05-22 DIAGNOSIS — M797 Fibromyalgia: Secondary | ICD-10-CM

## 2021-05-22 DIAGNOSIS — Z1589 Genetic susceptibility to other disease: Secondary | ICD-10-CM | POA: Diagnosis not present

## 2021-05-22 DIAGNOSIS — M0579 Rheumatoid arthritis with rheumatoid factor of multiple sites without organ or systems involvement: Secondary | ICD-10-CM

## 2021-05-22 DIAGNOSIS — Z8639 Personal history of other endocrine, nutritional and metabolic disease: Secondary | ICD-10-CM

## 2021-05-22 DIAGNOSIS — M79675 Pain in left toe(s): Secondary | ICD-10-CM | POA: Diagnosis not present

## 2021-05-22 DIAGNOSIS — Z8709 Personal history of other diseases of the respiratory system: Secondary | ICD-10-CM

## 2021-05-22 DIAGNOSIS — R5383 Other fatigue: Secondary | ICD-10-CM

## 2021-05-22 DIAGNOSIS — M8589 Other specified disorders of bone density and structure, multiple sites: Secondary | ICD-10-CM | POA: Diagnosis not present

## 2021-05-22 DIAGNOSIS — M19042 Primary osteoarthritis, left hand: Secondary | ICD-10-CM

## 2021-05-22 DIAGNOSIS — M19071 Primary osteoarthritis, right ankle and foot: Secondary | ICD-10-CM

## 2021-05-22 DIAGNOSIS — M7062 Trochanteric bursitis, left hip: Secondary | ICD-10-CM

## 2021-05-22 DIAGNOSIS — G8929 Other chronic pain: Secondary | ICD-10-CM

## 2021-05-22 DIAGNOSIS — M533 Sacrococcygeal disorders, not elsewhere classified: Secondary | ICD-10-CM

## 2021-05-22 DIAGNOSIS — M19072 Primary osteoarthritis, left ankle and foot: Secondary | ICD-10-CM

## 2021-05-22 DIAGNOSIS — Z8659 Personal history of other mental and behavioral disorders: Secondary | ICD-10-CM

## 2021-05-23 LAB — COMPLETE METABOLIC PANEL WITH GFR
AG Ratio: 1.5 (calc) (ref 1.0–2.5)
ALT: 21 U/L (ref 6–29)
AST: 11 U/L (ref 10–35)
Albumin: 4.2 g/dL (ref 3.6–5.1)
Alkaline phosphatase (APISO): 71 U/L (ref 37–153)
BUN/Creatinine Ratio: 31 (calc) — ABNORMAL HIGH (ref 6–22)
BUN: 30 mg/dL — ABNORMAL HIGH (ref 7–25)
CO2: 17 mmol/L — ABNORMAL LOW (ref 20–32)
Calcium: 10 mg/dL (ref 8.6–10.4)
Chloride: 103 mmol/L (ref 98–110)
Creat: 0.97 mg/dL (ref 0.50–1.05)
Globulin: 2.8 g/dL (calc) (ref 1.9–3.7)
Glucose, Bld: 363 mg/dL — ABNORMAL HIGH (ref 65–99)
Potassium: 4.5 mmol/L (ref 3.5–5.3)
Sodium: 137 mmol/L (ref 135–146)
Total Bilirubin: 0.2 mg/dL (ref 0.2–1.2)
Total Protein: 7 g/dL (ref 6.1–8.1)
eGFR: 64 mL/min/{1.73_m2} (ref >=60)

## 2021-05-23 LAB — CBC WITH DIFFERENTIAL/PLATELET
Absolute Monocytes: 264 cells/uL (ref 200–950)
Basophils Absolute: 35 cells/uL (ref 0–200)
Basophils Relative: 0.2 %
Eosinophils Absolute: 18 cells/uL (ref 15–500)
Eosinophils Relative: 0.1 %
HCT: 40.5 % (ref 35.0–45.0)
Hemoglobin: 13.9 g/dL (ref 11.7–15.5)
Lymphs Abs: 1654 cells/uL (ref 850–3900)
MCH: 33.3 pg — ABNORMAL HIGH (ref 27.0–33.0)
MCHC: 34.3 g/dL (ref 32.0–36.0)
MCV: 96.9 fL (ref 80.0–100.0)
MPV: 11.7 fL (ref 7.5–12.5)
Monocytes Relative: 1.5 %
Neutro Abs: 15629 cells/uL — ABNORMAL HIGH (ref 1500–7800)
Neutrophils Relative %: 88.8 %
Platelets: 424 10*3/uL — ABNORMAL HIGH (ref 140–400)
RBC: 4.18 10*6/uL (ref 3.80–5.10)
RDW: 14 % (ref 11.0–15.0)
Total Lymphocyte: 9.4 %
WBC: 17.6 10*3/uL — ABNORMAL HIGH (ref 3.8–10.8)

## 2021-05-23 LAB — URIC ACID: Uric Acid, Serum: 7.7 mg/dL — ABNORMAL HIGH (ref 2.5–7.0)

## 2021-05-23 LAB — SEDIMENTATION RATE: Sed Rate: 33 mm/h — ABNORMAL HIGH (ref 0–30)

## 2021-05-23 NOTE — Patient Instructions (Addendum)
1.  moderate persistent asthma, uncomplicated-Breztri caused drowsiness and Trelegy not effective ?- Daily controller medication(s): Continue Breo 200 mcg 1 puff once a day to help prevent cough and wheeze ?-Consider decreasing to Breo 100 mcg if you continue to do well at your next office visit. ?- continue Singulair (montelukast) 10 mg once a day ?- Prior to physical activity: albuterol 2 puffs 10-15 minutes before physical activity. ?- Rescue medications: albuterol 4 puffs every 4-6 hours as needed ?- Asthma control goals:  ?* Full participation in all desired activities (may need albuterol before activity) ?* Albuterol use two time or less a week on average (not counting use with activity) ?* Cough interfering with sleep two time or less a month ?* Oral steroids no more than once a year ?* No hospitalizations ? ?2. Chronic rhinitis ?-Continue using azelastine one spray per nostril up to twice daily as needed for runny nose/drainage down throat ?- Continue with Xyzal (levocetirizine) 5 mg daily.  ? ?3. Nasal sore ?Recommend scheduling a follow up appointment with ENT (Dr. Unice Bailey) if the nasal sore does not go away ? ? ? Schedule a follow up appointment in 6 months or sooner if needed ? ? ? ? ? ? ? ? ? ?

## 2021-05-23 NOTE — Progress Notes (Signed)
WBC count is elevated-likely due to prednisone use.   ?Plt count is slightly elevated-424-could be due to inflammation.  ?Glucose is very elevated-363.  Please notify the patient.  ?Uric acid is elevated-7.7. ESR is elevated-33.  ? ?Reviewed lab work with Dr. Estanislado Pandy.  Recommend repeating ESR and uric acid in 2 weeks.   ? ?Please advise the patient to notify us if her left great toe pain persists or worsens.

## 2021-05-24 ENCOUNTER — Encounter: Payer: Self-pay | Admitting: Family

## 2021-05-24 ENCOUNTER — Ambulatory Visit: Payer: PPO | Admitting: Family

## 2021-05-24 ENCOUNTER — Telehealth: Payer: Self-pay | Admitting: *Deleted

## 2021-05-24 VITALS — BP 124/86 | HR 80 | Temp 97.3°F | Resp 18

## 2021-05-24 DIAGNOSIS — J4540 Moderate persistent asthma, uncomplicated: Secondary | ICD-10-CM

## 2021-05-24 DIAGNOSIS — J3489 Other specified disorders of nose and nasal sinuses: Secondary | ICD-10-CM | POA: Diagnosis not present

## 2021-05-24 DIAGNOSIS — J310 Chronic rhinitis: Secondary | ICD-10-CM | POA: Diagnosis not present

## 2021-05-24 DIAGNOSIS — M79675 Pain in left toe(s): Secondary | ICD-10-CM

## 2021-05-24 NOTE — Progress Notes (Signed)
? ?[address], [place] [address] ?Dept: [phone] ? ?FOLLOW UP NOTE ? ?Patient ID: Stephanie Norris, female    DOB: [birth year]  Age: 66 y.o. MRN: [id number] ?Date of Office Visit: 05/24/2021 ? ?Assessment  ?Chief Complaint: No chief complaint on file. ? ?HPI ?Stephanie Norris is a 66 year old female who presents today for follow-up of moderate persistent asthma, chronic rhinitis, and right nasal sore.  She was last seen on January 25, 2021 by myself.  Since her last office visit she denies any new diagnosis or surgeries. ? ?Moderate persistent asthma is reported as controlled with Breo 200 mcg 1 puff once a day, Singulair 10 mg once a day, and albuterol as needed.  She denies coughing, wheezing, tightness in chest, shortness of breath, and nocturnal awakenings due to breathing problems.  Since her last office visit she has not required any trips to the emergency room or urgent care due to breathing problems.  She just finished a round of steroids for her left foot that was swollen and sore.  She reports this is now better.  She has not had to use her albuterol inhaler since we last saw her. ? ?Chronic rhinitis is reported as moderately controlled with azelastine nasal spray daily and Xyzal 5 mg daily.  She reports a little bit of drippy rhinorrhea and very minimal postnasal drip.  She denies nasal congestion.  He has not had any sinus infections since we last saw her.  She reports the sore in her right nostril is getting better and she feels like it is getting smaller.   ? ? ?Drug Allergies:  ?Allergies  ?Allergen Reactions  ? Nsaids Anaphylaxis  ? Bactrim [Sulfamethoxazole-Trimethoprim] Nausea Only  ?  Upset stomach  ? Cephalexin Rash  ? Levofloxacin Rash  ? Macrobid [Nitrofurantoin] Rash  ? ? ?Review of Systems: ?Review of Systems  ?Constitutional:  Negative for chills and fever.  ?HENT:    ?     Reports little bit of a drippy nose and a little bit of postnasal drip.  Denies nasal congestion.   She reports that the nasal sore in her right nostril is getting better and smaller  ?Eyes:   ?     Reports a little bit of itchy watery eyes  ?Respiratory:  Negative for cough, shortness of breath and wheezing.   ?Cardiovascular:  Negative for chest pain and palpitations.  ?Gastrointestinal:   ?     Denies heartburn or reflux symptoms  ?Genitourinary:  Positive for frequency.  ?     Reports increased frequency of urination, but reports that she feels like her Botox injection is wearing off.  ?Skin:  Negative for itching and rash.  ?Neurological:  Negative for headaches.  ?Endo/Heme/Allergies:  Negative for environmental allergies.  ? ? ?Physical Exam: ?BP 124/86 (BP Location: Right Arm, Patient Position: Sitting)   Pulse 80   Temp (!) 97.3 ?F (36.3 ?C)   Resp 18   ? ?Physical Exam ?Constitutional:   ?   Appearance: Normal appearance.  ?HENT:  ?   Head: Normocephalic and atraumatic.  ?   Comments: Pharynx normal, eyes normal, ears normal, nose: Slight irritation noted in right nostril with dried green drainage noted ?   Right Ear: Tympanic membrane, ear canal and external ear normal.  ?   Left Ear: Tympanic membrane, ear canal and external ear normal.  ?   Mouth/Throat:  ?   Mouth: Mucous membranes are moist.  ?   Pharynx: Oropharynx  is clear.  ?Eyes:  ?   Conjunctiva/sclera: Conjunctivae normal.  ?Cardiovascular:  ?   Rate and Rhythm: Regular rhythm.  ?   Heart sounds: Normal heart sounds.  ?Pulmonary:  ?   Effort: Pulmonary effort is normal.  ?   Breath sounds: Normal breath sounds.  ?   Comments: Lungs clear to auscultation ?Musculoskeletal:  ?   Cervical back: Neck supple.  ?Skin: ?   General: Skin is warm.  ?Neurological:  ?   Mental Status: She is alert and oriented to person, place, and time.  ?Psychiatric:     ?   Mood and Affect: Mood normal.     ?   Behavior: Behavior normal.     ?   Thought Content: Thought content normal.     ?   Judgment: Judgment normal.  ? ? ?Diagnostics: ?VC 2.20 L, (91%), FEV1  1.52 L, (80%).  Predicted FVC 2.42 L, predicted FEV1 1.91 L.  Spirometry indicates normal respiratory function. ? ?Assessment and Plan: ?1. Moderate persistent asthma, uncomplicated   ?2. Chronic rhinitis   ?3. Nasal sore   ? ? ?No orders of the defined types were placed in this encounter. ? ? ?Patient Instructions  ?1.  moderate persistent asthma, uncomplicated-Breztri caused drowsiness and Trelegy not effective ?- Daily controller medication(s): Continue Breo 200 mcg 1 puff once a day to help prevent cough and wheeze ?-Consider decreasing to Breo 100 mcg if you continue to do well at your next office visit. ?- continue Singulair (montelukast) 10 mg once a day ?- Prior to physical activity: albuterol 2 puffs 10-15 minutes before physical activity. ?- Rescue medications: albuterol 4 puffs every 4-6 hours as needed ?- Asthma control goals:  ?* Full participation in all desired activities (may need albuterol before activity) ?* Albuterol use two time or less a week on average (not counting use with activity) ?* Cough interfering with sleep two time or less a month ?* Oral steroids no more than once a year ?* No hospitalizations ? ?2. Chronic rhinitis ?-Continue using azelastine one spray per nostril up to twice daily as needed for runny nose/drainage down throat ?- Continue with Xyzal (levocetirizine) 5 mg daily.  ? ?3. Nasal sore ?Recommend scheduling a follow up appointment with ENT (Dr. Unice Bailey) if the nasal sore does not go away ? ? ? Schedule a follow up appointment in 6 months or sooner if needed ? ? ? ? ? ? ? ? ? ? ?Return in about 6 months (around 11/23/2021), or if symptoms worsen or fail to improve. ?  ? ?Thank you for the opportunity to care for this patient.  Please do not hesitate to contact me with questions. ? ?Althea Charon, FNP ?[facility] ? ? ? ? ?

## 2021-05-24 NOTE — Telephone Encounter (Signed)
-----  Message from Ofilia Neas, PA-C sent at 05/23/2021  4:51 PM EDT ----- ?WBC count is elevated-likely due to prednisone use.   ?Plt count is slightly elevated-424-could be due to inflammation.  ?Glucose is very elevated-363.  Please notify the patient.  ?Uric acid is elevated-7.7. ESR is elevated-33.  ? ?Reviewed lab work with Dr. Estanislado Pandy.  Recommend repeating ESR and uric acid in 2 weeks.   ? ?Please advise the patient to notify us if her left great toe pain persists or worsens.  ?

## 2021-07-05 DIAGNOSIS — E1142 Type 2 diabetes mellitus with diabetic polyneuropathy: Secondary | ICD-10-CM | POA: Diagnosis not present

## 2021-07-05 DIAGNOSIS — E782 Mixed hyperlipidemia: Secondary | ICD-10-CM | POA: Diagnosis not present

## 2021-07-05 DIAGNOSIS — E039 Hypothyroidism, unspecified: Secondary | ICD-10-CM | POA: Diagnosis not present

## 2021-07-12 DIAGNOSIS — R42 Dizziness and giddiness: Secondary | ICD-10-CM | POA: Diagnosis not present

## 2021-07-12 DIAGNOSIS — E782 Mixed hyperlipidemia: Secondary | ICD-10-CM | POA: Diagnosis not present

## 2021-07-12 DIAGNOSIS — E039 Hypothyroidism, unspecified: Secondary | ICD-10-CM | POA: Diagnosis not present

## 2021-07-12 DIAGNOSIS — M0579 Rheumatoid arthritis with rheumatoid factor of multiple sites without organ or systems involvement: Secondary | ICD-10-CM | POA: Diagnosis not present

## 2021-07-12 DIAGNOSIS — J4540 Moderate persistent asthma, uncomplicated: Secondary | ICD-10-CM | POA: Diagnosis not present

## 2021-07-12 DIAGNOSIS — E1142 Type 2 diabetes mellitus with diabetic polyneuropathy: Secondary | ICD-10-CM | POA: Diagnosis not present

## 2021-07-12 DIAGNOSIS — G4733 Obstructive sleep apnea (adult) (pediatric): Secondary | ICD-10-CM | POA: Diagnosis not present

## 2021-07-12 DIAGNOSIS — K219 Gastro-esophageal reflux disease without esophagitis: Secondary | ICD-10-CM | POA: Diagnosis not present

## 2021-07-12 DIAGNOSIS — J302 Other seasonal allergic rhinitis: Secondary | ICD-10-CM | POA: Diagnosis not present

## 2021-07-12 DIAGNOSIS — G9009 Other idiopathic peripheral autonomic neuropathy: Secondary | ICD-10-CM | POA: Diagnosis not present

## 2021-07-12 DIAGNOSIS — F331 Major depressive disorder, recurrent, moderate: Secondary | ICD-10-CM | POA: Diagnosis not present

## 2021-07-12 DIAGNOSIS — I10 Essential (primary) hypertension: Secondary | ICD-10-CM | POA: Diagnosis not present

## 2021-07-16 ENCOUNTER — Other Ambulatory Visit: Payer: Self-pay | Admitting: Family

## 2021-07-16 NOTE — Telephone Encounter (Signed)
Ok to send 90 day supply with no refills.

## 2021-07-21 DIAGNOSIS — H43393 Other vitreous opacities, bilateral: Secondary | ICD-10-CM | POA: Diagnosis not present

## 2021-07-21 DIAGNOSIS — H524 Presbyopia: Secondary | ICD-10-CM | POA: Diagnosis not present

## 2021-08-04 DIAGNOSIS — J449 Chronic obstructive pulmonary disease, unspecified: Secondary | ICD-10-CM | POA: Diagnosis not present

## 2021-08-04 DIAGNOSIS — E782 Mixed hyperlipidemia: Secondary | ICD-10-CM | POA: Diagnosis not present

## 2021-08-04 DIAGNOSIS — E1142 Type 2 diabetes mellitus with diabetic polyneuropathy: Secondary | ICD-10-CM | POA: Diagnosis not present

## 2021-08-04 DIAGNOSIS — I10 Essential (primary) hypertension: Secondary | ICD-10-CM | POA: Diagnosis not present

## 2021-08-06 ENCOUNTER — Other Ambulatory Visit: Payer: Self-pay | Admitting: Physician Assistant

## 2021-08-07 NOTE — Telephone Encounter (Signed)
Next Visit: 08/29/2021  Last Visit: 05/22/2021  Last Fill: 03/27/2021  DX: : Rheumatoid arthritis with rheumatoid factor of multiple sites without organ or systems involvement   Current Dose per office note 05/22/2021: Methotrexate 6 tablets by mouth every 7 days   Labs: 05/22/2021 WBC count is elevated-likely due to prednisone use.   Plt count is slightly elevated-424-could be due to inflammation.  Glucose is very elevated-363.  Please notify the patient.  Uric acid is elevated-7.7. ESR is elevated-33.    Reviewed lab work with Dr. Estanislado Pandy.  Recommend repeating ESR and uric acid in 2 weeks.  I called patient, patient will have labs drawn on 08/09/2021.   Please advise the patient to notify us if her left great toe pain persists or worsens.   Okay to refill MTX?

## 2021-08-09 ENCOUNTER — Other Ambulatory Visit: Payer: Self-pay | Admitting: *Deleted

## 2021-08-09 DIAGNOSIS — M79675 Pain in left toe(s): Secondary | ICD-10-CM

## 2021-08-09 DIAGNOSIS — Z79899 Other long term (current) drug therapy: Secondary | ICD-10-CM | POA: Diagnosis not present

## 2021-08-10 LAB — CBC WITH DIFFERENTIAL/PLATELET
Absolute Monocytes: 813 cells/uL (ref 200–950)
Basophils Absolute: 86 cells/uL (ref 0–200)
Basophils Relative: 0.8 %
Eosinophils Absolute: 610 cells/uL — ABNORMAL HIGH (ref 15–500)
Eosinophils Relative: 5.7 %
HCT: 41.9 % (ref 35.0–45.0)
Hemoglobin: 14.1 g/dL (ref 11.7–15.5)
Lymphs Abs: 2964 cells/uL (ref 850–3900)
MCH: 31.2 pg (ref 27.0–33.0)
MCHC: 33.7 g/dL (ref 32.0–36.0)
MCV: 92.7 fL (ref 80.0–100.0)
MPV: 11.2 fL (ref 7.5–12.5)
Monocytes Relative: 7.6 %
Neutro Abs: 6227 cells/uL (ref 1500–7800)
Neutrophils Relative %: 58.2 %
Platelets: 409 10*3/uL — ABNORMAL HIGH (ref 140–400)
RBC: 4.52 10*6/uL (ref 3.80–5.10)
RDW: 13.7 % (ref 11.0–15.0)
Total Lymphocyte: 27.7 %
WBC: 10.7 10*3/uL (ref 3.8–10.8)

## 2021-08-10 LAB — COMPLETE METABOLIC PANEL WITH GFR
AG Ratio: 1.8 (calc) (ref 1.0–2.5)
ALT: 24 U/L (ref 6–29)
AST: 17 U/L (ref 10–35)
Albumin: 4.2 g/dL (ref 3.6–5.1)
Alkaline phosphatase (APISO): 82 U/L (ref 37–153)
BUN/Creatinine Ratio: 38 (calc) — ABNORMAL HIGH (ref 6–22)
BUN: 33 mg/dL — ABNORMAL HIGH (ref 7–25)
CO2: 19 mmol/L — ABNORMAL LOW (ref 20–32)
Calcium: 10.6 mg/dL — ABNORMAL HIGH (ref 8.6–10.4)
Chloride: 101 mmol/L (ref 98–110)
Creat: 0.86 mg/dL (ref 0.50–1.05)
Globulin: 2.4 g/dL (calc) (ref 1.9–3.7)
Glucose, Bld: 181 mg/dL — ABNORMAL HIGH (ref 65–99)
Potassium: 4.7 mmol/L (ref 3.5–5.3)
Sodium: 139 mmol/L (ref 135–146)
Total Bilirubin: 0.3 mg/dL (ref 0.2–1.2)
Total Protein: 6.6 g/dL (ref 6.1–8.1)
eGFR: 74 mL/min/{1.73_m2} (ref >=60)

## 2021-08-10 LAB — URIC ACID: Uric Acid, Serum: 6.7 mg/dL (ref 2.5–7.0)

## 2021-08-10 LAB — SEDIMENTATION RATE: Sed Rate: 9 mm/h (ref 0–30)

## 2021-08-10 NOTE — Progress Notes (Signed)
Glucose is elevated.  Calcium is elevated.  CBC normal, uric acid 6.7, sed rate normal.  Please advise patient to avoid calcium supplement.

## 2021-08-15 NOTE — Progress Notes (Signed)
Office Visit Note  Patient: Stephanie Norris             Date of Birth: 02/13/1955           MRN: [id number]             PCP: Celene Squibb, MD Referring: Celene Squibb, MD Visit Date: 08/29/2021 Occupation: @GUAROCC @  Subjective:  Medication management  History of Present Illness: Stephanie Norris is a 66 y.o. female with history ofRheumatoid arthritis, osteoarthritis and degenerative disc disease.  She denies any history of joint swelling.  She has some stiffness in the morning and has difficulty climbing stairs.  She has been taking methotrexate 6 tablets p.o. weekly along with folic acid 1 mg p.o. daily without any side effects.  She complains of a stiffness in her bilateral hands and her bilateral feet.  She continues to have some SI joint pain and left trochanteric pain.  She has stiffness in her neck and lower back.  She also has some generalized pain and discomfort from fibromyalgia.  Activities of Daily Living:  Patient reports morning stiffness for a few minutes.   Patient Denies nocturnal pain.  Difficulty dressing/grooming: Denies Difficulty climbing stairs: Reports Difficulty getting out of chair: Reports Difficulty using hands for taps, buttons, cutlery, and/or writing: Reports  Review of Systems  Constitutional:  Positive for fatigue.  HENT:  Positive for mouth dryness. Negative for mouth sores.   Eyes:  Positive for dryness.  Respiratory:  Negative for shortness of breath.   Cardiovascular:  Negative for chest pain and palpitations.  Gastrointestinal:  Negative for blood in stool, constipation and diarrhea.  Endocrine: Negative for increased urination.  Genitourinary:  Negative for involuntary urination.  Musculoskeletal:  Positive for joint pain, joint pain, joint swelling, myalgias, morning stiffness, muscle tenderness and myalgias. Negative for muscle weakness.  Skin:  Negative for color change, rash, hair loss and sensitivity to sunlight.  Allergic/Immunologic:  Positive for susceptible to infections.  Neurological:  Positive for dizziness and headaches.  Hematological:  Negative for swollen glands.  Psychiatric/Behavioral:  Positive for depressed mood. Negative for sleep disturbance. The patient is nervous/anxious.     PMFS History:  Patient Active Problem List   Diagnosis Date Noted   Mucous cyst of digit of left hand 04/07/2020   Vertigo 08/20/2016   Eustachian tube dysfunction 04/17/2016   Abdominal pain 03/02/2016   Gastroenteritis 03/02/2016   Rheumatoid arthritis with rheumatoid factor of multiple sites without organ or systems involvement ([place]) 01/13/2016   HLA B27 (HLA B27 positive) 01/13/2016   Osteoarthritis of foot 01/13/2016   DJD (degenerative joint disease), cervical 01/13/2016   Spondylosis of lumbar region without myelopathy or radiculopathy 01/13/2016   Primary osteoarthritis of both hands 01/13/2016   Non-seasonal allergic rhinitis 08/13/2014   Stiffness of joints, not elsewhere classified, multiple sites 08/26/2013   Flu-like symptoms 03/02/2013   Lactic acidosis 03/02/2013   Sinus tachycardia 03/02/2013   Elevated lactic acid level 03/02/2013   Influenza due to identified novel influenza A virus with other respiratory manifestations 03/02/2013   Hyperglycemia 03/02/2013   Fibromyalgia    OSA (obstructive sleep apnea) 06/22/2011   DOE (dyspnea on exertion) 05/30/2011   Moderate persistent asthma 03/30/2011    Past Medical History:  Diagnosis Date   Asthma    COPD (chronic obstructive pulmonary disease) ([place])    Diabetes mellitus without complication (H[facility])    Type 2   Dizziness    Fibromyalgia    GERD (  gastroesophageal reflux disease)    Headache    Hyperlipidemia    OSA (obstructive sleep apnea)    Osteoarthritis    PCOS (polycystic ovarian syndrome)    Raynaud disease    Rheumatoid arthritis ([place])     Family History  Problem Relation Age of Onset   Asthma Mother    Rheum arthritis Mother    Allergic  rhinitis Mother    Rheum arthritis Sister    Allergic rhinitis Sister    Heart failure Father    Heart disease Father    Rheum arthritis Sister    Allergic rhinitis Sister    Allergic rhinitis Brother    Allergies Other        "Everyone"   Heart disease Maternal Grandfather    Heart disease Maternal Grandmother    Heart disease Paternal Grandfather    Heart disease Paternal Grandmother    Past Surgical History:  Procedure Laterality Date   APPENDECTOMY     BACK SURGERY  2021   CHOLECYSTECTOMY     COLONOSCOPY W/ BIOPSIES  fall 2009   COMBINED HYSTERECTOMY VAGINAL / OOPHORECTOMY / A&P REPAIR  10/2002   endometriosis, cystocele, fibroids   FINGER SURGERY Left 05/09/2020   spurs removed from left index finger   [place] W/ TAH     Social History   Social History Narrative   Lives with husband   Retired    no children   Ass[employer] degree   16 oz caffeine daily   Immunization History  Administered Date(s) Administered   Influenza Whole 10/07/2010, 11/06/2011   Influenza,inj,Quad PF,6+ Mos 11/05/2012, 11/02/2015, 11/22/2016, 11/15/2017   Influenza-Unspecified 11/05/2013   Moderna Sars-Covid-2 Vaccination 04/13/2019, 05/11/2019   Pneumococcal Conjugate-13 11/06/2014   Pneumococcal Polysaccharide-23 11/02/2015     Objective: Vital Signs: BP 120/77 (BP Location: Left Arm, Patient Position: Sitting, Cuff Size: Normal)   Pulse 86   Ht 5' (1.524 m)   Wt 120 lb 9.6 oz (54.7 kg)   BMI 23.55 kg/m    Physical Exam Vitals and nursing note reviewed.  Constitutional:      Appearance: She is well-developed.  HENT:     Head: Normocephalic and atraumatic.  Eyes:     Conjunctiva/sclera: Conjunctivae normal.  Cardiovascular:     Rate and Rhythm: Normal rate and regular rhythm.     Heart sounds: Normal heart sounds.  Pulmonary:     Effort: Pulmonary effort is normal.     Breath sounds: Normal breath sounds.  Abdominal:      General: Bowel sounds are normal.     Palpations: Abdomen is soft.  Musculoskeletal:     Cervical back: Normal range of motion.  Lymphadenopathy:     Cervical: No cervical adenopathy.  Skin:    General: Skin is warm and dry.     Capillary Refill: Capillary refill takes less than 2 seconds.  Neurological:     Mental Status: She is alert and oriented to person, place, and time.  Psychiatric:        Behavior: Behavior normal.      Musculoskeletal Exam: She had limited range of motion of her cervical spine.  She had bilateral trapezius spasm.  Shoulder joints, elbow joints, wrist joints, MCPs PIPs and DIPs with good range of motion with no synovitis.  She had bilateral PIP and DIP thickening.  Hip joints in good range of motion.  She had good range of motion  of bilateral knee joints without any warmth swelling or effusion.  She had tenderness on palpation of the left trochanteric bursa.  There was no tenderness over her ankles.  She had no tenderness over MTPs or PIPs.  Pedal edema was noted.  CDAI Exam: CDAI Score: -- Patient Global: --; Provider Global: -- Swollen: --; Tender: -- Joint Exam 08/29/2021   No joint exam has been documented for this visit   There is currently no information documented on the homunculus. Go to the Rheumatology activity and complete the homunculus joint exam.  Investigation: No additional findings.  Imaging: No results found.  Recent Labs: Lab Results  Component Value Date   WBC 10.7 08/09/2021   HGB 14.1 08/09/2021   PLT 409 (H) 08/09/2021   NA 139 08/09/2021   K 4.7 08/09/2021   CL 101 08/09/2021   CO2 19 (L) 08/09/2021   GLUCOSE 181 (H) 08/09/2021   BUN 33 (H) 08/09/2021   CREATININE 0.86 08/09/2021   BILITOT 0.3 08/09/2021   ALKPHOS 54 09/10/2016   AST 17 08/09/2021   ALT 24 08/09/2021   PROT 6.6 08/09/2021   ALBUMIN 4.0 09/10/2016   CALCIUM 10.6 (H) 08/09/2021   GFRAA 96 07/26/2020    Speciality Comments: No specialty  comments available.  Procedures:  No procedures performed Allergies: Nsaids, Bactrim [sulfamethoxazole-trimethoprim], Cephalexin, Levofloxacin, and Macrobid [nitrofurantoin]   Assessment / Plan:     Visit Diagnoses: Rheumatoid arthritis with rheumatoid factor of multiple sites without organ or systems involvement ([place]) - +CCP: Patient had no synovitis on my examination.  She denies any history of joint swelling.  She continues to have some joint stiffness and joint discomfort.  She is currently on methotrexate 6 tablets p.o. weekly which she has been tolerating well.  High risk medication use - Methotrexate 6 tablets by mouth every 7 days and folic acid 1 mg 2 tablets daily.  Labs obtained on August 09, 2021 were within normal limits except for elevated calcium.  She has discontinued calcium and vitamin D supplement.  She was advised to get labs in October and every 3 months to monitor for drug toxicity.  Information regarding immunization was placed in the AVS.  She was also advised to stop methotrexate if she develops an infection and resume after the infection resolves.  HLA B27 (HLA B27 positive)  Primary osteoarthritis of both hands-she has bilateral PIP and DIP thickening with no synovitis.  Trochanteric bursitis of left hip-she continues to have discomfort over the left trochanteric bursa.  IT band stretches were demonstrated and discussed.  Primary osteoarthritis of both feet-she has osteoarthritis in her bilateral feet and also pedal edema.  Proper fitting shoes were discussed.  Chronic SI joint pain-she has chronic discomfort.  DDD (degenerative disc disease), cervical-she has been experiencing stiffness and discomfort in her cervical spine.  Range of motion exercises were demonstrated.  DDD (degenerative disc disease), lumbar -she continues to have chronic lower back pain.  She had discectomy performed April 2021 by Dr. Arnoldo Morale.   Osteopenia of multiple sites - 12/24/16 DEXA scan  showed T score of -2.3, BMD 0.596 in the left femoral neck.  Patient has rescheduled her bone density scan.  Fibromyalgia -she continues to have generalized pain and discomfort.  She is on Cymbalta 60 mg 1 capsule daily.  Need for regular exercise and stretching was discussed.  Trapezius muscle spasm-she continues to have some discomfort in the trapezius region.  Other fatigue-related to fibromyalgia.  She continues to have insomnia.  Good sleep hygiene was discussed.  History of diabetes mellitus  History of depression  History of asthma  History of sleep apnea  Orders: No orders of the defined types were placed in this encounter.  No orders of the defined types were placed in this encounter.    Follow-Up Instructions: Return in about 5 months (around 01/29/2022) for Rheumatoid arthritis, Osteoarthritis.   Bo Merino, MD  Note - This record has been created using Editor, commissioning.  Chart creation errors have been sought, but may not always  have been located. Such creation errors do not reflect on  the standard of medical care.

## 2021-08-22 DIAGNOSIS — R519 Headache, unspecified: Secondary | ICD-10-CM | POA: Diagnosis not present

## 2021-08-29 ENCOUNTER — Ambulatory Visit: Payer: PPO | Admitting: Rheumatology

## 2021-08-29 ENCOUNTER — Encounter: Payer: Self-pay | Admitting: Rheumatology

## 2021-08-29 VITALS — BP 120/77 | HR 86 | Ht 60.0 in | Wt 120.6 lb

## 2021-08-29 DIAGNOSIS — Z79899 Other long term (current) drug therapy: Secondary | ICD-10-CM | POA: Diagnosis not present

## 2021-08-29 DIAGNOSIS — M8589 Other specified disorders of bone density and structure, multiple sites: Secondary | ICD-10-CM

## 2021-08-29 DIAGNOSIS — M62838 Other muscle spasm: Secondary | ICD-10-CM

## 2021-08-29 DIAGNOSIS — M533 Sacrococcygeal disorders, not elsewhere classified: Secondary | ICD-10-CM | POA: Diagnosis not present

## 2021-08-29 DIAGNOSIS — M5030 Other cervical disc degeneration, unspecified cervical region: Secondary | ICD-10-CM | POA: Diagnosis not present

## 2021-08-29 DIAGNOSIS — N3946 Mixed incontinence: Secondary | ICD-10-CM | POA: Diagnosis not present

## 2021-08-29 DIAGNOSIS — M19071 Primary osteoarthritis, right ankle and foot: Secondary | ICD-10-CM

## 2021-08-29 DIAGNOSIS — G8929 Other chronic pain: Secondary | ICD-10-CM

## 2021-08-29 DIAGNOSIS — Z1589 Genetic susceptibility to other disease: Secondary | ICD-10-CM | POA: Diagnosis not present

## 2021-08-29 DIAGNOSIS — Z8669 Personal history of other diseases of the nervous system and sense organs: Secondary | ICD-10-CM

## 2021-08-29 DIAGNOSIS — M7062 Trochanteric bursitis, left hip: Secondary | ICD-10-CM | POA: Diagnosis not present

## 2021-08-29 DIAGNOSIS — M19072 Primary osteoarthritis, left ankle and foot: Secondary | ICD-10-CM

## 2021-08-29 DIAGNOSIS — Z8639 Personal history of other endocrine, nutritional and metabolic disease: Secondary | ICD-10-CM

## 2021-08-29 DIAGNOSIS — M19041 Primary osteoarthritis, right hand: Secondary | ICD-10-CM

## 2021-08-29 DIAGNOSIS — M797 Fibromyalgia: Secondary | ICD-10-CM

## 2021-08-29 DIAGNOSIS — Z8659 Personal history of other mental and behavioral disorders: Secondary | ICD-10-CM

## 2021-08-29 DIAGNOSIS — M5136 Other intervertebral disc degeneration, lumbar region: Secondary | ICD-10-CM

## 2021-08-29 DIAGNOSIS — M79675 Pain in left toe(s): Secondary | ICD-10-CM

## 2021-08-29 DIAGNOSIS — M0579 Rheumatoid arthritis with rheumatoid factor of multiple sites without organ or systems involvement: Secondary | ICD-10-CM

## 2021-08-29 DIAGNOSIS — M19042 Primary osteoarthritis, left hand: Secondary | ICD-10-CM

## 2021-08-29 DIAGNOSIS — Z8709 Personal history of other diseases of the respiratory system: Secondary | ICD-10-CM

## 2021-08-29 DIAGNOSIS — R5383 Other fatigue: Secondary | ICD-10-CM

## 2021-08-29 NOTE — Patient Instructions (Signed)
Standing Labs We placed an order today for your standing lab work.   Please have your standing labs drawn in October and every 3 months  If possible, please have your labs drawn 2 weeks prior to your appointment so that the provider can discuss your results at your appointment.  Please note that you may see your imaging and lab results in MyChart before we have reviewed them. We may be awaiting multiple results to interpret others before contacting you. Please allow our office up to 72 hours to thoroughly review all of the results before contacting the office for clarification of your results.  We have open lab daily: Monday through Thursday from 1:30-4:30 PM and Friday from 1:30-4:00 PM at the office of Dr. Jenilee Franey, [facility].   Please be advised, all patients with office appointments requiring lab work will take precedent over walk-in lab work.  If possible, please come for your lab work on Monday and Friday afternoons, as you may experience shorter wait times. The office is located at [address], [address], [place], [place] [address] No appointment is necessary.   Labs are drawn by [facility]. Please bring your co-pay at the time of your lab draw.  You may receive a bill from [facility] for your lab work.  Please note if you are on Hydroxychloroquine and and an order has been placed for a Hydroxychloroquine level, you will need to have it drawn 4 hours or more after your last dose.  If you wish to have your labs drawn at another location, please call the office 24 hours in advance to send orders.  If you have any questions regarding directions or hours of operation,  please call [phone].   As a reminder, please drink plenty of water prior to coming for your lab work. Thanks!   Vaccines You are taking a medication(s) that can suppress your immune system.  The following immunizations are recommended: Flu annually Covid-19  Td/Tdap (tetanus, diphtheria,  pertussis) every 10 years Pneumonia (Prevnar 15 then Pneumovax 23 at least 1 year apart.  Alternatively, can take Prevnar 20 without needing additional dose) Shingrix: 2 doses from 4 weeks to 6 months apart  Please check with your PCP to make sure you are up to date.   If you have signs or symptoms of an infection or start antibiotics: First, call your PCP for workup of your infection. Hold your medication through the infection, until you complete your antibiotics, and until symptoms resolve if you take the following: Injectable medication (Actemra, Benlysta, Cimzia, Cosentyx, Enbrel, Humira, Kevzara, Orencia, Remicade, Simponi, Stelara, Taltz, Tremfya) Methotrexate Leflunomide (Arava) Mycophenolate (Cellcept) Xeljanz, Olumiant, or Rinvoq  

## 2021-09-07 DIAGNOSIS — N3941 Urge incontinence: Secondary | ICD-10-CM | POA: Diagnosis not present

## 2021-09-11 ENCOUNTER — Other Ambulatory Visit: Payer: Self-pay | Admitting: Physician Assistant

## 2021-09-11 NOTE — Telephone Encounter (Signed)
Next Visit: 01/25/2022  Last Visit: 08/29/2021  Last Fill: 08/07/2021 (30 day supply)  DX: Rheumatoid arthritis with rheumatoid factor of multiple sites without organ or systems involvement  Current Dose per office note 08/29/2021: Methotrexate 6 tablets by mouth every 7 days   Labs: 08/09/2021 Glucose is elevated.  Calcium is elevated.  CBC normal, uric acid 6.7, sed rate normal.  Okay to refill MTX?

## 2021-09-28 ENCOUNTER — Other Ambulatory Visit: Payer: Self-pay | Admitting: Family

## 2021-10-05 DIAGNOSIS — I10 Essential (primary) hypertension: Secondary | ICD-10-CM | POA: Diagnosis not present

## 2021-10-05 DIAGNOSIS — J449 Chronic obstructive pulmonary disease, unspecified: Secondary | ICD-10-CM | POA: Diagnosis not present

## 2021-10-05 DIAGNOSIS — E782 Mixed hyperlipidemia: Secondary | ICD-10-CM | POA: Diagnosis not present

## 2021-10-05 DIAGNOSIS — E1142 Type 2 diabetes mellitus with diabetic polyneuropathy: Secondary | ICD-10-CM | POA: Diagnosis not present

## 2021-10-17 DIAGNOSIS — H43393 Other vitreous opacities, bilateral: Secondary | ICD-10-CM | POA: Diagnosis not present

## 2021-10-19 DIAGNOSIS — E782 Mixed hyperlipidemia: Secondary | ICD-10-CM | POA: Diagnosis not present

## 2021-10-19 DIAGNOSIS — E039 Hypothyroidism, unspecified: Secondary | ICD-10-CM | POA: Diagnosis not present

## 2021-10-19 DIAGNOSIS — E1142 Type 2 diabetes mellitus with diabetic polyneuropathy: Secondary | ICD-10-CM | POA: Diagnosis not present

## 2021-10-25 DIAGNOSIS — F331 Major depressive disorder, recurrent, moderate: Secondary | ICD-10-CM | POA: Diagnosis not present

## 2021-10-25 DIAGNOSIS — E039 Hypothyroidism, unspecified: Secondary | ICD-10-CM | POA: Diagnosis not present

## 2021-10-25 DIAGNOSIS — J4540 Moderate persistent asthma, uncomplicated: Secondary | ICD-10-CM | POA: Diagnosis not present

## 2021-10-25 DIAGNOSIS — E782 Mixed hyperlipidemia: Secondary | ICD-10-CM | POA: Diagnosis not present

## 2021-10-25 DIAGNOSIS — J302 Other seasonal allergic rhinitis: Secondary | ICD-10-CM | POA: Diagnosis not present

## 2021-10-25 DIAGNOSIS — M0579 Rheumatoid arthritis with rheumatoid factor of multiple sites without organ or systems involvement: Secondary | ICD-10-CM | POA: Diagnosis not present

## 2021-10-25 DIAGNOSIS — G9009 Other idiopathic peripheral autonomic neuropathy: Secondary | ICD-10-CM | POA: Diagnosis not present

## 2021-10-25 DIAGNOSIS — K219 Gastro-esophageal reflux disease without esophagitis: Secondary | ICD-10-CM | POA: Diagnosis not present

## 2021-10-25 DIAGNOSIS — Z0001 Encounter for general adult medical examination with abnormal findings: Secondary | ICD-10-CM | POA: Diagnosis not present

## 2021-10-25 DIAGNOSIS — E1140 Type 2 diabetes mellitus with diabetic neuropathy, unspecified: Secondary | ICD-10-CM | POA: Diagnosis not present

## 2021-10-25 DIAGNOSIS — I10 Essential (primary) hypertension: Secondary | ICD-10-CM | POA: Diagnosis not present

## 2021-10-25 DIAGNOSIS — G4733 Obstructive sleep apnea (adult) (pediatric): Secondary | ICD-10-CM | POA: Diagnosis not present

## 2021-10-26 DIAGNOSIS — Z961 Presence of intraocular lens: Secondary | ICD-10-CM | POA: Diagnosis not present

## 2021-11-21 NOTE — Progress Notes (Unsigned)
Office Visit Note  Patient: Stephanie Norris             Date of Birth: 20-Jun-1955           MRN: [id number]             PCP: Celene Squibb, MD Referring: Celene Squibb, MD Visit Date: 11/22/2021 Occupation: @GUAROCC @  Subjective:  Back pain   History of Present Illness: Stephanie Norris is a 66 y.o. female with history of rheumatoid arthritis, osteoarthritis, and fibromyalgia.  She is taking methotrexate 6 tablets by mouth once weekly and folic acid 2 mg daily.  She continues to tolerate methotrexate without any side effects.  She denies any signs or symptoms of a rheumatoid arthritis flare.  She is not currently experiencing any joint swelling. She denies any recent or recurrent infections. Patient presents today with increased discomfort in her lower back for the past 1-1/2 weeks.  She states that she was performing chair exercises and has had persistent discomfort since then.  She states the pain has been constant and is currently a 4 out of 10.  She is been taking Tylenol as needed for pain relief as well as using ice and heat topically.  She denies any radiating or referred pain.  She has had some increased discomfort in the left trochanteric bursa.  She has had difficulty sleeping at night and has had to sleep on the couch since she has had difficulty getting comfortable. She has not yet scheduled an updated bone density.     Activities of Daily Living:  Patient reports morning stiffness for 30 minutes.   Patient Reports nocturnal pain.  Difficulty dressing/grooming: Denies Difficulty climbing stairs: Reports Difficulty getting out of chair: Denies Difficulty using hands for taps, buttons, cutlery, and/or writing: Reports  Review of Systems  Constitutional:  Positive for fatigue.  HENT:  Positive for mouth dryness. Negative for mouth sores.   Eyes:  Positive for dryness.  Respiratory:  Negative for shortness of breath.   Cardiovascular:  Negative for chest pain and palpitations.   Gastrointestinal:  Negative for blood in stool, constipation and diarrhea.  Endocrine: Negative for increased urination.  Genitourinary:  Negative for involuntary urination.  Musculoskeletal:  Positive for joint pain, gait problem, joint pain, joint swelling, myalgias, muscle weakness, morning stiffness, muscle tenderness and myalgias.  Skin:  Negative for color change, rash, hair loss and sensitivity to sunlight.  Allergic/Immunologic: Negative for susceptible to infections.  Neurological:  Positive for dizziness and headaches.  Hematological:  Negative for swollen glands.  Psychiatric/Behavioral:  Positive for depressed mood and sleep disturbance. The patient is nervous/anxious.     PMFS History:  Patient Active Problem List   Diagnosis Date Noted   Mucous cyst of digit of left hand 04/07/2020   Vertigo 08/20/2016   Eustachian tube dysfunction 04/17/2016   Abdominal pain 03/02/2016   Gastroenteritis 03/02/2016   Rheumatoid arthritis with rheumatoid factor of multiple sites without organ or systems involvement ([place]) 01/13/2016   HLA B27 (HLA B27 positive) 01/13/2016   Osteoarthritis of foot 01/13/2016   DJD (degenerative joint disease), cervical 01/13/2016   Spondylosis of lumbar region without myelopathy or radiculopathy 01/13/2016   Primary osteoarthritis of both hands 01/13/2016   Non-seasonal allergic rhinitis 08/13/2014   Stiffness of joints, not elsewhere classified, multiple sites 08/26/2013   Flu-like symptoms 03/02/2013   Lactic acidosis 03/02/2013   Sinus tachycardia 03/02/2013   Elevated lactic acid level 03/02/2013   Influenza due to  identified novel influenza A virus with other respiratory manifestations 03/02/2013   Hyperglycemia 03/02/2013   Fibromyalgia    OSA (obstructive sleep apnea) 06/22/2011   DOE (dyspnea on exertion) 05/30/2011   Moderate persistent asthma 03/30/2011    Past Medical History:  Diagnosis Date   Asthma    COPD (chronic obstructive  pulmonary disease) (HCC)    Diabetes mellitus without complication (HCC)    Type 2   Dizziness    Fibromyalgia    GERD (gastroesophageal reflux disease)    Headache    Hyperlipidemia    OSA (obstructive sleep apnea)    Osteoarthritis    PCOS (polycystic ovarian syndrome)    Raynaud disease    Rheumatoid arthritis ([place])     Family History  Problem Relation Age of Onset   Asthma Mother    Rheum arthritis Mother    Allergic rhinitis Mother    Rheum arthritis Sister    Allergic rhinitis Sister    Heart failure Father    Heart disease Father    Rheum arthritis Sister    Allergic rhinitis Sister    Allergic rhinitis Brother    Allergies Other        "Everyone"   Heart disease Maternal Grandfather    Heart disease Maternal Grandmother    Heart disease Paternal Grandfather    Heart disease Paternal Grandmother    Past Surgical History:  Procedure Laterality Date   APPENDECTOMY     BACK SURGERY  2021   CHOLECYSTECTOMY     COLONOSCOPY W/ BIOPSIES  fall 2009   COMBINED HYSTERECTOMY VAGINAL / OOPHORECTOMY / A&P REPAIR  10/2002   endometriosis, cystocele, fibroids   FINGER SURGERY Left 05/09/2020   spurs removed from left index finger   [place] FISTULA CLOSURE W/ TAH     Social History   Social History Narrative   Lives with husband   Retired    no children   Assoc degree   16 oz caffeine daily   Immunization History  Administered Date(s) Administered   Influenza Whole 10/07/2010, 11/06/2011   Influenza,inj,Quad PF,6+ Mos 11/05/2012, 11/02/2015, 11/22/2016, 11/15/2017   Influenza-Unspecified 11/05/2013   Moderna Sars-Covid-2 Vaccination 04/13/2019, 05/11/2019   Pneumococcal Conjugate-13 11/06/2014   Pneumococcal Polysaccharide-23 11/02/2015     Objective: Vital Signs: BP 139/86 (BP Location: Left Arm, Patient Position: Sitting, Cuff Size: Normal)   Pulse 89   Resp 14   Ht 5' (1.524 m)   Wt 121 lb 12.8 oz (55.2 kg)    BMI 23.79 kg/m    Physical Exam Vitals and nursing note reviewed.  Constitutional:      Appearance: She is well-developed.  HENT:     Head: Normocephalic and atraumatic.  Eyes:     Conjunctiva/sclera: Conjunctivae normal.  Cardiovascular:     Rate and Rhythm: Normal rate and regular rhythm.     Heart sounds: Normal heart sounds.  Pulmonary:     Effort: Pulmonary effort is normal.     Breath sounds: Normal breath sounds.  Abdominal:     General: Bowel sounds are normal.     Palpations: Abdomen is soft.  Musculoskeletal:     Cervical back: Normal range of motion.  Skin:    General: Skin is warm and dry.     Capillary Refill: Capillary refill takes less than 2 seconds.  Neurological:     Mental Status: She is alert and oriented to person, place, and time.  Psychiatric:        Behavior: Behavior normal.      Musculoskeletal Exam: C-spine has slightly limited range of motion with no discomfort.  Trapezius muscle tension tenderness bilaterally.  Shoulder joints, elbow joints, wrist joints, MCPs, PIPs, DIPs have good range of motion with no synovitis.  Complete fist formation noted bilaterally.  PIP and DIP thickening consistent with osteoarthritis of both hands.  Subluxation of the right index DIP joint.  Hip joints have good range of motion with no groin pain.  Tenderness palpation over the left trochanteric bursa.  Painful range of motion of the lumbar spine.  Midline spinal tenderness in the lumbar region as well as tenderness over the right paraspinal muscles in the lumbar region.  She also has tenderness with palpation of the left SI joint. Knee joints have good range of motion with no warmth or effusion.  Ankle joints have good range of motion with no tenderness or joint swelling.  CDAI Exam: CDAI Score: -- Patient Global: 1 mm; Provider Global: 1 mm Swollen: --; Tender: -- Joint Exam 11/22/2021   No joint exam has been documented for this visit   There is currently no  information documented on the homunculus. Go to the Rheumatology activity and complete the homunculus joint exam.  Investigation: No additional findings.  Imaging: No results found.  Recent Labs: Lab Results  Component Value Date   WBC 10.7 08/09/2021   HGB 14.1 08/09/2021   PLT 409 (H) 08/09/2021   NA 139 08/09/2021   K 4.7 08/09/2021   CL 101 08/09/2021   CO2 19 (L) 08/09/2021   GLUCOSE 181 (H) 08/09/2021   BUN 33 (H) 08/09/2021   CREATININE 0.86 08/09/2021   BILITOT 0.3 08/09/2021   ALKPHOS 54 09/10/2016   AST 17 08/09/2021   ALT 24 08/09/2021   PROT 6.6 08/09/2021   ALBUMIN 4.0 09/10/2016   CALCIUM 10.6 (H) 08/09/2021   GFRAA 96 07/26/2020    Speciality Comments: No specialty comments available.  Procedures:  Sacroiliac Joint Inj on 11/22/2021 11:48 AM Indications: pain Details: 27 G 1.5 in needle, posterior approach Medications: 1 mL lidocaine 1 %; 40 mg triamcinolone acetonide 40 MG/ML Aspirate: 0 mL Outcome: tolerated well, no immediate complications Procedure, treatment alternatives, risks and benefits explained, specific risks discussed. Consent was given by the patient. Immediately prior to procedure a time out was called to verify the correct patient, procedure, equipment, support staff and site/side marked as required. Patient was prepped and draped in the usual sterile fashion.     Allergies: Nsaids, Bactrim [sulfamethoxazole-trimethoprim], Cephalexin, Levofloxacin, and Macrobid [nitrofurantoin]    Assessment / Plan:     Visit Diagnoses: Rheumatoid arthritis with rheumatoid factor of multiple sites without organ or systems involvement ([place]) - +CCP: She has no joint tenderness or synovitis on examination today.  She has not had any signs or symptoms of a rheumatoid arthritis flare.  She has clinically been doing well taking methotrexate 6 tablets by mouth once weekly and folic acid 2 mg daily.  Her morning stiffness has been lasting about 30 minutes daily.   No medication changes will be made at this time.  She was advised to notify us if she develops signs or symptoms of a rheumatoid arthritis flare.  She will follow-up in the office in 5 months or sooner if needed.  High risk medication use - Methotrexate 6 tablets by mouth every 7 days and folic acid 1 mg 2 tablets daily.  - Plan: CBC with  Differential/Platelet, COMPLETE METABOLIC PANEL WITH GFR CBC and CMP updated on 08/09/21. Orders for CBC and CMP released today.  Her next lab work will be due in January and every 3 months.  She has not had any recent or recurrent infections.  Discussed the importance of holding methotrexate if she develops signs or symptoms of an infection and to resume once the infection has completely cleared.   She has had the annual flu shot.  HLA B27 (HLA B27 positive)  Primary osteoarthritis of both hands: She has PIP and DIP thickening consistent with osteoarthritis of both hands.  Subluxation of the right index DIP noted.  Discussed the importance of joint protection and muscle strengthening.  Trochanteric bursitis of left hip: Ongoing tenderness over the left trochanteric bursa.  Encouraged the patient to perform stretching exercises daily.  Primary osteoarthritis of both feet: She is not experiencing any increased discomfort in her feet at this time.  Chronic SI joint pain -Patient presents today with increased discomfort in her lower back.  1-1/2 weeks ago she was performing chair exercises and has had increased discomfort since then.  She has tried ice, heat, and taking Tylenol for pain relief.  On examination today she has midline spinal tenderness as well as tenderness over the left SI joint.  X-rays of the lumbar spine and pelvis were obtained today for further evaluation.  After informed consent the left SI joint was injected with cortisone.  She tolerated the procedure well.  Procedure note was completed above.  Aftercare was discussed.  Plan: XR Pelvis 1-2  Views  Acute midline low back pain without sciatica -She presents today with increased pain in her lower back for the past 1-1/2 weeks.  She is not experiencing any radiating or referred pain.  No lower extremity muscle weakness.  She has tried taking Tylenol as well as using ice and heat for symptomatic relief.  X-rays of the lumbar spine and pelvis were obtained today.  The left SI joint was injected with cortisone after informed consent.  Plan: XR Lumbar Spine 2-3 Views, XR Pelvis 1-2 Views  DDD (degenerative disc disease), cervical: Limited ROM with lateral rotation.   DDD (degenerative disc disease), lumbar - Discectomy performed April 2021 by Dr. Arnoldo Morale.  She presents today with increased discomfort in her lower back.  She has midline spinal tenderness in the lumbar spine as well as tenderness over the left SI joint.  X-rays of the lumbar spine and pelvis were obtained today and results were reviewed with the patient today.  The patient requested a left SI joint cortisone injection and the procedure note was completed above.  She was advised to notify us if her symptoms persist or worsen.- Plan: XR Lumbar Spine 2-3 Views  Osteopenia of multiple sites - 12/24/16 DEXA scan showed T score of -2.3, BMD 0.596 in the left femoral neck.  Patient is in need of scheduling an updated bone density.  A future order for a bone density remains in place.  She was encouraged to call [facility] to schedule an appointment.  Fibromyalgia: She has generalized hyperalgesia and positive tender points on examination.  She performs chair exercises on a regular basis.  She remains on Cymbalta as prescribed.  Trapezius muscle spasm: Trapezius muscle tension tenderness noted bilaterally.  Other fatigue: Stable  Other medical conditions are listed as follows:  History of diabetes mellitus  History of depression  History of asthma  History of sleep apnea   Orders: Orders Placed This Encounter  Procedures    Sacroiliac Joint Inj   XR Lumbar Spine 2-3 Views   XR Pelvis 1-2 Views   CBC with Differential/Platelet   COMPLETE METABOLIC PANEL WITH GFR   No orders of the defined types were placed in this encounter.     Follow-Up Instructions: Return in about 5 months (around 04/23/2022) for Rheumatoid arthritis, Osteoarthritis, Fibromyalgia.   Ofilia Neas, PA-C  Note - This record has been created using Dragon software.  Chart creation errors have been sought, but may not always  have been located. Such creation errors do not reflect on  the standard of medical care.,

## 2021-11-22 ENCOUNTER — Ambulatory Visit (INDEPENDENT_AMBULATORY_CARE_PROVIDER_SITE_OTHER): Payer: PPO

## 2021-11-22 ENCOUNTER — Ambulatory Visit: Payer: PPO | Attending: Physician Assistant | Admitting: Physician Assistant

## 2021-11-22 ENCOUNTER — Encounter: Payer: Self-pay | Admitting: Physician Assistant

## 2021-11-22 VITALS — BP 139/86 | HR 89 | Resp 14 | Ht 60.0 in | Wt 121.8 lb

## 2021-11-22 DIAGNOSIS — M19072 Primary osteoarthritis, left ankle and foot: Secondary | ICD-10-CM

## 2021-11-22 DIAGNOSIS — M533 Sacrococcygeal disorders, not elsewhere classified: Secondary | ICD-10-CM

## 2021-11-22 DIAGNOSIS — R5383 Other fatigue: Secondary | ICD-10-CM

## 2021-11-22 DIAGNOSIS — G8929 Other chronic pain: Secondary | ICD-10-CM

## 2021-11-22 DIAGNOSIS — M19041 Primary osteoarthritis, right hand: Secondary | ICD-10-CM

## 2021-11-22 DIAGNOSIS — M7062 Trochanteric bursitis, left hip: Secondary | ICD-10-CM | POA: Diagnosis not present

## 2021-11-22 DIAGNOSIS — M0579 Rheumatoid arthritis with rheumatoid factor of multiple sites without organ or systems involvement: Secondary | ICD-10-CM | POA: Diagnosis not present

## 2021-11-22 DIAGNOSIS — Z8669 Personal history of other diseases of the nervous system and sense organs: Secondary | ICD-10-CM

## 2021-11-22 DIAGNOSIS — M545 Low back pain, unspecified: Secondary | ICD-10-CM

## 2021-11-22 DIAGNOSIS — M62838 Other muscle spasm: Secondary | ICD-10-CM

## 2021-11-22 DIAGNOSIS — M19071 Primary osteoarthritis, right ankle and foot: Secondary | ICD-10-CM

## 2021-11-22 DIAGNOSIS — Z79899 Other long term (current) drug therapy: Secondary | ICD-10-CM

## 2021-11-22 DIAGNOSIS — Z8659 Personal history of other mental and behavioral disorders: Secondary | ICD-10-CM

## 2021-11-22 DIAGNOSIS — M8589 Other specified disorders of bone density and structure, multiple sites: Secondary | ICD-10-CM

## 2021-11-22 DIAGNOSIS — M5136 Other intervertebral disc degeneration, lumbar region: Secondary | ICD-10-CM

## 2021-11-22 DIAGNOSIS — Z1589 Genetic susceptibility to other disease: Secondary | ICD-10-CM | POA: Diagnosis not present

## 2021-11-22 DIAGNOSIS — Z8709 Personal history of other diseases of the respiratory system: Secondary | ICD-10-CM

## 2021-11-22 DIAGNOSIS — Z8639 Personal history of other endocrine, nutritional and metabolic disease: Secondary | ICD-10-CM

## 2021-11-22 DIAGNOSIS — M51369 Other intervertebral disc degeneration, lumbar region without mention of lumbar back pain or lower extremity pain: Secondary | ICD-10-CM

## 2021-11-22 DIAGNOSIS — M797 Fibromyalgia: Secondary | ICD-10-CM | POA: Diagnosis not present

## 2021-11-22 DIAGNOSIS — M5030 Other cervical disc degeneration, unspecified cervical region: Secondary | ICD-10-CM | POA: Diagnosis not present

## 2021-11-22 DIAGNOSIS — M19042 Primary osteoarthritis, left hand: Secondary | ICD-10-CM

## 2021-11-22 MED ORDER — TRIAMCINOLONE ACETONIDE 40 MG/ML IJ SUSP
40.0000 mg | INTRAMUSCULAR | Status: AC | PRN
  Administered 2021-11-22: 40 mg via INTRA_ARTICULAR

## 2021-11-22 MED ORDER — LIDOCAINE HCL 1 % IJ SOLN
1.0000 mL | INTRAMUSCULAR | Status: AC | PRN
  Administered 2021-11-22: 1 mL

## 2021-11-22 NOTE — Progress Notes (Signed)
CBC stable

## 2021-11-22 NOTE — Patient Instructions (Signed)
Standing Labs We placed an order today for your standing lab work.   Please have your standing labs drawn in January and every 3 months    Please have your labs drawn 2 weeks prior to your appointment so that the provider can discuss your lab results at your appointment.  Please note that you may see your imaging and lab results in MyChart before we have reviewed them. We will contact you once all results are reviewed. Please allow our office up to 72 hours to thoroughly review all of the results before contacting the office for clarification of your results.  Lab hours are: Monday through Thursday from 1:30 pm-4:30 pm and Friday from 1:30 pm- 4:00 pm  You may experience shorter wait times on Monday, Thursday or Friday afternoons,.   Effective December 04, 2021, new lab hours will be: Monday through Thursday from 8:00 am -12:30 pm and 1:00 pm-5:00 pm and Friday from 8:00 am-12:00 pm.  Please be advised, all patients with office appointments requiring lab work will take precedent over walk-in lab work.   Labs are drawn by [facility]. Please bring your co-pay at the time of your lab draw.  You may receive a bill from [facility] for your lab work.  Please note if you are on Hydroxychloroquine and and an order has been placed for a Hydroxychloroquine level, you will need to have it drawn 4 hours or more after your last dose.  If you wish to have your labs drawn at another location, please call the office 24 hours in advance so we can fax the orders.  The office is located at [address], [address], [place], [place] [address] No appointment is necessary.    If you have any questions regarding directions or hours of operation,  please call [phone].   As a reminder, please drink plenty of water prior to coming for your lab work. Thanks!\ If you have signs or symptoms of an infection or start antibiotics: First, call your PCP for workup of your infection. Hold your medication through the  infection, until you complete your antibiotics, and until symptoms resolve if you take the following: Injectable medication (Actemra, Benlysta, Cimzia, Cosentyx, Enbrel, Humira, Kevzara, Orencia, Remicade, Simponi, Stelara, Taltz, Tremfya) Methotrexate Leflunomide (Arava) Mycophenolate (Cellcept) Xeljanz, Olumiant, or Rinvoq   Vaccines You are taking a medication(s) that can suppress your immune system.  The following immunizations are recommended: Flu annually Covid-19  Td/Tdap (tetanus, diphtheria, pertussis) every 10 years Pneumonia (Prevnar 15 then Pneumovax 23 at least 1 year apart.  Alternatively, can take Prevnar 20 without needing additional dose) Shingrix: 2 doses from 4 weeks to 6 months apart  Please check with your PCP to make sure you are up to date.   

## 2021-11-23 LAB — COMPLETE METABOLIC PANEL WITH GFR
AG Ratio: 1.7 (calc) (ref 1.0–2.5)
ALT: 28 U/L (ref 6–29)
AST: 16 U/L (ref 10–35)
Albumin: 4.5 g/dL (ref 3.6–5.1)
Alkaline phosphatase (APISO): 69 U/L (ref 37–153)
BUN/Creatinine Ratio: 40 (calc) — ABNORMAL HIGH (ref 6–22)
BUN: 32 mg/dL — ABNORMAL HIGH (ref 7–25)
CO2: 21 mmol/L (ref 20–32)
Calcium: 10.5 mg/dL — ABNORMAL HIGH (ref 8.6–10.4)
Chloride: 104 mmol/L (ref 98–110)
Creat: 0.8 mg/dL (ref 0.50–1.05)
Globulin: 2.7 g/dL (calc) (ref 1.9–3.7)
Glucose, Bld: 144 mg/dL — ABNORMAL HIGH (ref 65–99)
Potassium: 4.4 mmol/L (ref 3.5–5.3)
Sodium: 138 mmol/L (ref 135–146)
Total Bilirubin: 0.3 mg/dL (ref 0.2–1.2)
Total Protein: 7.2 g/dL (ref 6.1–8.1)
eGFR: 80 mL/min/{1.73_m2} (ref >=60)

## 2021-11-23 LAB — CBC WITH DIFFERENTIAL/PLATELET
Absolute Monocytes: 568 cells/uL (ref 200–950)
Basophils Absolute: 90 cells/uL (ref 0–200)
Basophils Relative: 0.7 %
Eosinophils Absolute: 194 cells/uL (ref 15–500)
Eosinophils Relative: 1.5 %
HCT: 40.9 % (ref 35.0–45.0)
Hemoglobin: 14.4 g/dL (ref 11.7–15.5)
Lymphs Abs: 5392 cells/uL — ABNORMAL HIGH (ref 850–3900)
MCH: 31.6 pg (ref 27.0–33.0)
MCHC: 35.2 g/dL (ref 32.0–36.0)
MCV: 89.7 fL (ref 80.0–100.0)
MPV: 11.6 fL (ref 7.5–12.5)
Monocytes Relative: 4.4 %
Neutro Abs: 6656 cells/uL (ref 1500–7800)
Neutrophils Relative %: 51.6 %
Platelets: 409 10*3/uL — ABNORMAL HIGH (ref 140–400)
RBC: 4.56 10*6/uL (ref 3.80–5.10)
RDW: 14.9 % (ref 11.0–15.0)
Total Lymphocyte: 41.8 %
WBC: 12.9 10*3/uL — ABNORMAL HIGH (ref 3.8–10.8)

## 2021-11-23 NOTE — Progress Notes (Signed)
Calcium is borderline elevated-10.5.  avoid the use of a calcium supplement at this time.  Rest of CMP stable. We will continue to monitor lab work every 3 months.

## 2021-12-16 DIAGNOSIS — J019 Acute sinusitis, unspecified: Secondary | ICD-10-CM | POA: Diagnosis not present

## 2021-12-19 DIAGNOSIS — G43909 Migraine, unspecified, not intractable, without status migrainosus: Secondary | ICD-10-CM | POA: Diagnosis not present

## 2021-12-19 DIAGNOSIS — G4733 Obstructive sleep apnea (adult) (pediatric): Secondary | ICD-10-CM | POA: Diagnosis not present

## 2021-12-19 DIAGNOSIS — J302 Other seasonal allergic rhinitis: Secondary | ICD-10-CM | POA: Diagnosis not present

## 2021-12-19 DIAGNOSIS — K219 Gastro-esophageal reflux disease without esophagitis: Secondary | ICD-10-CM | POA: Diagnosis not present

## 2021-12-19 DIAGNOSIS — R42 Dizziness and giddiness: Secondary | ICD-10-CM | POA: Diagnosis not present

## 2021-12-19 DIAGNOSIS — G4700 Insomnia, unspecified: Secondary | ICD-10-CM | POA: Diagnosis not present

## 2021-12-19 DIAGNOSIS — U071 COVID-19: Secondary | ICD-10-CM | POA: Diagnosis not present

## 2021-12-19 DIAGNOSIS — J4540 Moderate persistent asthma, uncomplicated: Secondary | ICD-10-CM | POA: Diagnosis not present

## 2021-12-27 DIAGNOSIS — J441 Chronic obstructive pulmonary disease with (acute) exacerbation: Secondary | ICD-10-CM | POA: Diagnosis not present

## 2022-01-08 DIAGNOSIS — J019 Acute sinusitis, unspecified: Secondary | ICD-10-CM | POA: Diagnosis not present

## 2022-01-14 ENCOUNTER — Other Ambulatory Visit: Payer: Self-pay | Admitting: Family

## 2022-01-17 ENCOUNTER — Other Ambulatory Visit: Payer: Self-pay | Admitting: Rheumatology

## 2022-01-17 NOTE — Telephone Encounter (Signed)
Next Visit: 04/26/2022  Last Visit: 11/22/2021  Last Fill: 09/11/2021  DX: Rheumatoid arthritis with rheumatoid factor of multiple sites without organ or systems involvement   Current Dose per office note 11/22/2021: Methotrexate 6 tablets by mouth every 7 days   Labs: 11/22/2021 CBC stable. Calcium is borderline elevated-10.5. Rest of CMP stable.   Okay to refill Methotrexate?

## 2022-01-23 DIAGNOSIS — E1140 Type 2 diabetes mellitus with diabetic neuropathy, unspecified: Secondary | ICD-10-CM | POA: Diagnosis not present

## 2022-01-23 DIAGNOSIS — E039 Hypothyroidism, unspecified: Secondary | ICD-10-CM | POA: Diagnosis not present

## 2022-01-23 DIAGNOSIS — E782 Mixed hyperlipidemia: Secondary | ICD-10-CM | POA: Diagnosis not present

## 2022-01-23 DIAGNOSIS — H43393 Other vitreous opacities, bilateral: Secondary | ICD-10-CM | POA: Diagnosis not present

## 2022-01-25 ENCOUNTER — Ambulatory Visit: Payer: PPO | Admitting: Physician Assistant

## 2022-01-30 ENCOUNTER — Other Ambulatory Visit (HOSPITAL_COMMUNITY): Payer: Self-pay | Admitting: Family Medicine

## 2022-01-30 DIAGNOSIS — G9009 Other idiopathic peripheral autonomic neuropathy: Secondary | ICD-10-CM | POA: Diagnosis not present

## 2022-01-30 DIAGNOSIS — Z1231 Encounter for screening mammogram for malignant neoplasm of breast: Secondary | ICD-10-CM

## 2022-01-30 DIAGNOSIS — J4520 Mild intermittent asthma, uncomplicated: Secondary | ICD-10-CM | POA: Diagnosis not present

## 2022-01-30 DIAGNOSIS — J329 Chronic sinusitis, unspecified: Secondary | ICD-10-CM | POA: Diagnosis not present

## 2022-01-30 DIAGNOSIS — E1140 Type 2 diabetes mellitus with diabetic neuropathy, unspecified: Secondary | ICD-10-CM | POA: Diagnosis not present

## 2022-01-30 DIAGNOSIS — I10 Essential (primary) hypertension: Secondary | ICD-10-CM | POA: Diagnosis not present

## 2022-01-30 DIAGNOSIS — E039 Hypothyroidism, unspecified: Secondary | ICD-10-CM | POA: Diagnosis not present

## 2022-01-30 DIAGNOSIS — J302 Other seasonal allergic rhinitis: Secondary | ICD-10-CM | POA: Diagnosis not present

## 2022-01-30 DIAGNOSIS — F331 Major depressive disorder, recurrent, moderate: Secondary | ICD-10-CM | POA: Diagnosis not present

## 2022-01-30 DIAGNOSIS — E782 Mixed hyperlipidemia: Secondary | ICD-10-CM | POA: Diagnosis not present

## 2022-01-30 DIAGNOSIS — M069 Rheumatoid arthritis, unspecified: Secondary | ICD-10-CM | POA: Diagnosis not present

## 2022-01-30 DIAGNOSIS — G4733 Obstructive sleep apnea (adult) (pediatric): Secondary | ICD-10-CM | POA: Diagnosis not present

## 2022-01-30 DIAGNOSIS — K219 Gastro-esophageal reflux disease without esophagitis: Secondary | ICD-10-CM | POA: Diagnosis not present

## 2022-02-03 DIAGNOSIS — J40 Bronchitis, not specified as acute or chronic: Secondary | ICD-10-CM | POA: Diagnosis not present

## 2022-02-03 DIAGNOSIS — Z6824 Body mass index (BMI) 24.0-24.9, adult: Secondary | ICD-10-CM | POA: Diagnosis not present

## 2022-02-14 ENCOUNTER — Other Ambulatory Visit (HOSPITAL_COMMUNITY): Payer: Self-pay | Admitting: Rheumatology

## 2022-02-14 ENCOUNTER — Ambulatory Visit (HOSPITAL_COMMUNITY)
Admission: RE | Admit: 2022-02-14 | Discharge: 2022-02-14 | Disposition: A | Discharge status: Home / Self Care | Payer: PPO | Source: Ambulatory Visit | Attending: Family Medicine | Admitting: Family Medicine

## 2022-02-14 DIAGNOSIS — Z1231 Encounter for screening mammogram for malignant neoplasm of breast: Secondary | ICD-10-CM | POA: Diagnosis not present

## 2022-02-22 DIAGNOSIS — R350 Frequency of micturition: Secondary | ICD-10-CM | POA: Diagnosis not present

## 2022-03-01 DIAGNOSIS — N3946 Mixed incontinence: Secondary | ICD-10-CM | POA: Diagnosis not present

## 2022-03-02 DIAGNOSIS — J019 Acute sinusitis, unspecified: Secondary | ICD-10-CM | POA: Diagnosis not present

## 2022-03-02 DIAGNOSIS — Z6824 Body mass index (BMI) 24.0-24.9, adult: Secondary | ICD-10-CM | POA: Diagnosis not present

## 2022-03-26 DIAGNOSIS — N390 Urinary tract infection, site not specified: Secondary | ICD-10-CM | POA: Diagnosis not present

## 2022-03-26 DIAGNOSIS — R030 Elevated blood-pressure reading, without diagnosis of hypertension: Secondary | ICD-10-CM | POA: Diagnosis not present

## 2022-03-26 DIAGNOSIS — Z6823 Body mass index (BMI) 23.0-23.9, adult: Secondary | ICD-10-CM | POA: Diagnosis not present

## 2022-03-26 DIAGNOSIS — R319 Hematuria, unspecified: Secondary | ICD-10-CM | POA: Diagnosis not present

## 2022-03-31 DIAGNOSIS — N390 Urinary tract infection, site not specified: Secondary | ICD-10-CM | POA: Diagnosis not present

## 2022-03-31 DIAGNOSIS — Z6823 Body mass index (BMI) 23.0-23.9, adult: Secondary | ICD-10-CM | POA: Diagnosis not present

## 2022-04-21 ENCOUNTER — Other Ambulatory Visit: Payer: Self-pay | Admitting: Family

## 2022-04-26 ENCOUNTER — Ambulatory Visit: Payer: PPO | Admitting: Physician Assistant

## 2022-05-01 NOTE — Progress Notes (Signed)
Office Visit Note  Patient: Stephanie Norris             Date of Birth: [birth year]           MRN: [id number]             PCP: Stephanie Stabile, MD Referring: Stephanie Stabile, MD Visit Date: 05/15/2022 Occupation: @  Subjective:  Medication monitoring  History of Present Illness: Stephanie Norris is a 67 y.o. female with history of seropositive rheumatoid arthritis and osteoarthritis. Patient is taking Methotrexate 6 tablets by mouth every 7 days and folic acid 1 mg 2 tablets daily.  Patient is tolerating methotrexate without any side effects.  Patient reports that she had a UTI little over 1 month ago which time she held methotrexate for 1 week.  She denies any other recurrent infections.  She states she has stiffness in both hands but denies any joint swelling.  She states that she has had some more frequent fibromyalgia flares which she attributes to being under increased stress since her husband recently fractured his leg as well as her brother-in-law passing away.  Yesterday she was having a flare to the point that she stayed in the bed most of the day.  She remains on Cymbalta 60 mg daily which she finds to be helpful.  Activities of Daily Living:  Patient reports morning stiffness for 30 minutes.   Patient Reports nocturnal pain.  Difficulty dressing/grooming: Denies Difficulty climbing stairs: Reports Difficulty getting out of chair: Denies Difficulty using hands for taps, buttons, cutlery, and/or writing: Reports  Review of Systems  Constitutional:  Positive for fatigue.  HENT:  Positive for mouth dryness. Negative for mouth sores.   Eyes:  Positive for dryness.  Respiratory:  Positive for shortness of breath.   Cardiovascular:  Negative for chest pain and palpitations.  Gastrointestinal:  Negative for blood in stool, constipation and diarrhea.  Endocrine: Negative for increased urination.  Genitourinary:  Negative for involuntary urination.  Musculoskeletal:  Positive for  joint pain, joint pain, myalgias, muscle weakness, morning stiffness, muscle tenderness and myalgias. Negative for gait problem and joint swelling.  Skin:  Negative for color change, rash, hair loss and sensitivity to sunlight.  Allergic/Immunologic: Positive for susceptible to infections.  Neurological:  Positive for headaches. Negative for dizziness.  Hematological:  Negative for swollen glands.  Psychiatric/Behavioral:  Negative for depressed mood and sleep disturbance. The patient is not nervous/anxious.     PMFS History:  Patient Active Problem List   Diagnosis Date Noted   Chronic rhinitis 05/07/2022   Sore in nostril 05/07/2022   Mucous cyst of digit of left hand 04/07/2020   Vertigo 08/20/2016   Eustachian tube dysfunction 04/17/2016   Abdominal pain 03/02/2016   Gastroenteritis 03/02/2016   Rheumatoid arthritis with rheumatoid factor of multiple sites without organ or systems involvement 01/13/2016   HLA B27 (HLA B27 positive) 01/13/2016   Osteoarthritis of foot 01/13/2016   DJD (degenerative joint disease), cervical 01/13/2016   Spondylosis of lumbar region without myelopathy or radiculopathy 01/13/2016   Primary osteoarthritis of both hands 01/13/2016   Non-seasonal allergic rhinitis 08/13/2014   Stiffness of joints, not elsewhere classified, multiple sites 08/26/2013   Flu-like symptoms 03/02/2013   Lactic acidosis 03/02/2013   Sinus tachycardia 03/02/2013   Elevated lactic acid level 03/02/2013   Influenza due to identified novel influenza A virus with other respiratory manifestations 03/02/2013   Hyperglycemia 03/02/2013   Fibromyalgia    OSA (obstructive sleep apnea)  06/22/2011   DOE (dyspnea on exertion) 05/30/2011   Not well controlled moderate persistent asthma 03/30/2011    Past Medical History:  Diagnosis Date   Asthma    COPD (chronic obstructive pulmonary disease)    Diabetes mellitus without complication    Type 2   Dizziness    Fibromyalgia    GERD  (gastroesophageal reflux disease)    Headache    Hyperlipidemia    OSA (obstructive sleep apnea)    Osteoarthritis    PCOS (polycystic ovarian syndrome)    Raynaud disease    Rheumatoid arthritis     Family History  Problem Relation Age of Onset   Asthma Mother    Rheum arthritis Mother    Allergic rhinitis Mother    Rheum arthritis Sister    Allergic rhinitis Sister    Heart failure Father    Heart disease Father    Rheum arthritis Sister    Allergic rhinitis Sister    Allergic rhinitis Brother    Allergies Other        "Everyone"   Heart disease Maternal Grandfather    Heart disease Maternal Grandmother    Heart disease Paternal Grandfather    Heart disease Paternal Grandmother    Past Surgical History:  Procedure Laterality Date   APPENDECTOMY     BACK SURGERY  2021   CHOLECYSTECTOMY     COLONOSCOPY W/ BIOPSIES  fall 2009   COMBINED HYSTERECTOMY VAGINAL / OOPHORECTOMY / A&P REPAIR  10/2002   endometriosis, cystocele, fibroids   FINGER SURGERY Left 05/09/2020   spurs removed from left index finger   KNEE SURGERY  1996   TOE SURGERY     VESICOVAGINAL FISTULA CLOSURE W/ TAH     Social History   Social History Narrative   Lives with husband   Retired    no children   Assoc degree   16 oz caffeine daily   Immunization History  Administered Date(s) Administered   Influenza Whole 10/07/2010, 11/06/2011   Influenza,inj,Quad PF,6+ Mos 11/05/2012, 11/02/2015, 11/22/2016, 11/15/2017   Influenza-Unspecified 11/05/2013   Moderna Sars-Covid-2 Vaccination 04/13/2019, 05/11/2019   Pneumococcal Conjugate-13 11/06/2014   Pneumococcal Polysaccharide-23 11/02/2015     Objective: Vital Signs: BP 128/81 (BP Location: Left Arm, Patient Position: Sitting, Cuff Size: Normal)   Pulse 81   Resp 16   Ht 4' 11.75" (1.518 m)   Wt 119 lb 12.8 oz (54.3 kg)   BMI 23.59 kg/m    Physical Exam Vitals and nursing note reviewed.  Constitutional:      Appearance: She is  well-developed.  HENT:     Head: Normocephalic and atraumatic.  Eyes:     Conjunctiva/sclera: Conjunctivae normal.  Cardiovascular:     Rate and Rhythm: Normal rate and regular rhythm.     Heart sounds: Normal heart sounds.  Pulmonary:     Effort: Pulmonary effort is normal.     Breath sounds: Normal breath sounds.  Abdominal:     General: Bowel sounds are normal.     Palpations: Abdomen is soft.  Musculoskeletal:     Cervical back: Normal range of motion.  Lymphadenopathy:     Cervical: No cervical adenopathy.  Skin:    General: Skin is warm and dry.     Capillary Refill: Capillary refill takes less than 2 seconds.  Neurological:     Mental Status: She is alert and oriented to person, place, and time.  Psychiatric:        Behavior: Behavior normal.  Musculoskeletal Exam: C-spine has limited range of motion without rotation.  Trapezius muscle tension and tenderness bilaterally.  Shoulder joints, elbow joints, wrist joints, MCPs, PIPs, DIPs have good range of motion with no synovitis.  PIP and DIP thickening consistent with osteoarthritis of both hands.  Hip joints have good range of motion with no groin pain currently.  Knee joints have good range of motion with no warmth or effusion.  Ankle joints have good range of motion with no tenderness or joint swelling.  CDAI Exam: CDAI Score: -- Patient Global: 3 mm; Provider Global: 3 mm Swollen: --; Tender: -- Joint Exam 05/15/2022   No joint exam has been documented for this visit   There is currently no information documented on the homunculus. Go to the Rheumatology activity and complete the homunculus joint exam.  Investigation: No additional findings.  Imaging: No results found.  Recent Labs: Lab Results  Component Value Date   WBC 12.9 (H) 11/22/2021   HGB 14.4 11/22/2021   PLT 409 (H) 11/22/2021   NA 138 11/22/2021   K 4.4 11/22/2021   CL 104 11/22/2021   CO2 21 11/22/2021   GLUCOSE 144 (H) 11/22/2021    BUN 32 (H) 11/22/2021   CREATININE 0.80 11/22/2021   BILITOT 0.3 11/22/2021   ALKPHOS 54 09/10/2016   AST 16 11/22/2021   ALT 28 11/22/2021   PROT 7.2 11/22/2021   ALBUMIN 4.0 09/10/2016   CALCIUM 10.5 (H) 11/22/2021   GFRAA 96 07/26/2020    Speciality Comments: No specialty comments available.  Procedures:  No procedures performed Allergies: Nsaids, Bactrim [sulfamethoxazole-trimethoprim], Cephalexin, and Levofloxacin   Assessment / Plan:     Visit Diagnoses: Rheumatoid arthritis with rheumatoid factor of multiple sites without organ or systems involvement - +CCP: She has no synovitis on examination today.  She has not had any signs or symptoms of a rheumatoid arthritis flare.  She has clinically been doing well taking methotrexate 6 tablets by mouth once weekly along with folic acid 2 mg daily.  She continues to tolerate methotrexate without any side effects. Her morning stiffness has been lasting 30 minutes daily.  She has intermittent stiffness in both hands but no active inflammation.  Discussed the importance of performing hand strengthening exercises.  She will remain on methotrexate as prescribed.  She is advised to notify us if she develops signs or symptoms of a flare.  She will follow-up in the office in 5 months or sooner if needed.  High risk medication use - Methotrexate 6 tablets by mouth every 7 days and folic acid 1 mg 2 tablets daily.  CBC and CMP updated on 11/22/21.  CBC and CMP updated today.  Her next lab work will be due in July and every 3 months.  She had a UTI little over 1 month ago at which time she held methotrexate for 1 week until her symptoms had completely resolved.  No recurrent infections.  Discussed the importance of holding methotrexate if she develops signs or symptoms of an infection and to resume once the infection has completely cleared.  - Plan: CBC with Differential/Platelet, COMPLETE METABOLIC PANEL WITH GFR  HLA B27 (HLA B27 positive)  Primary  osteoarthritis of both hands: She has PIP and DIP thickening consistent with osteoarthritis of both hands.  She experiences intermittent stiffness in both hands.  Discussed the importance of performing hand exercises daily.  She was given a handout of hand exercises to perform.  Trochanteric bursitis of left hip: Currently asymptomatic.  Primary osteoarthritis of both feet: She is not experiencing any discomfort in her feet at this time.  She is wearing proper fitting shoes.  Chronic SI joint pain - Left SI joint injected on 11/22/21.  Currently asymptomatic.  DDD (degenerative disc disease), cervical: C-spine has limited range of motion with lateral rotation.  No symptoms of radiculopathy at this time.  DDD (degenerative disc disease), lumbar - Discectomy performed April 2021 by Dr. Lovell Sheehan.  No symptoms of radiculopathy.  Osteopenia of multiple sites - 12/24/16 DEXA scan showed T score of -2.3, BMD 0.596 in the left femoral neck. Overdue to update DEXA.  Order placed today.   - Plan: DG BONE DENSITY (DXA)  Fibromyalgia: She continues to experience intermittent myalgias and muscle tenderness due to fibromyalgia.  Patient continues to have frequent flares of fibromyalgia typically exacerbated by her level of stress and weather changes.  She remains on Cymbalta 60 mg 1 capsule by mouth daily.  Discussed the importance of regular exercise, good sleep hygiene, and stress management.  Trapezius muscle spasm: She has trapezius muscle tension and tenderness bilaterally.  Other fatigue: She experiences intermittent bouts of fatigue typically worsen during fibromyalgia flares.  Discussed the importance of regular exercise and good sleep hygiene.  Other medical conditions are listed as follows:   History of diabetes mellitus  History of depression  History of asthma  History of sleep apnea  Orders: Orders Placed This Encounter  Procedures   DG BONE DENSITY (DXA)   CBC with  Differential/Platelet   COMPLETE METABOLIC PANEL WITH GFR   Meds ordered this encounter  Medications   methotrexate (RHEUMATREX) 2.5 MG tablet    Sig: TAKE 6 TABLETS BY MOUTH 1 TIME A WEEK    Dispense:  72 tablet    Refill:  0     Follow-Up Instructions: Return for Rheumatoid arthritis, Osteoarthritis.   Gearldine Bienenstock, PA-C  Note - This record has been created using Dragon software.  Chart creation errors have been sought, but may not always  have been located. Such creation errors do not reflect on  the standard of medical care.

## 2022-05-03 ENCOUNTER — Telehealth: Payer: Self-pay | Admitting: Family

## 2022-05-03 ENCOUNTER — Other Ambulatory Visit: Payer: Self-pay | Admitting: Family

## 2022-05-03 NOTE — Telephone Encounter (Signed)
Called patient - DOB verified - advised office visit is needed for medication refills.  Appt. Scheduled: 05/07/22 @ 10:30 in RD w/Anne.  Patient verbalized understanding, no questions.

## 2022-05-03 NOTE — Telephone Encounter (Signed)
Patient called and stated she needs a refill on medication montelukast montelukast (SINGULAIR) 10 MG tablet.

## 2022-05-07 ENCOUNTER — Other Ambulatory Visit: Payer: Self-pay

## 2022-05-07 ENCOUNTER — Encounter: Payer: Self-pay | Admitting: Family Medicine

## 2022-05-07 ENCOUNTER — Ambulatory Visit: Payer: PPO | Admitting: Family Medicine

## 2022-05-07 VITALS — BP 116/68 | HR 84 | Temp 97.3°F | Resp 16 | Ht 59.75 in | Wt 121.5 lb

## 2022-05-07 DIAGNOSIS — J310 Chronic rhinitis: Secondary | ICD-10-CM

## 2022-05-07 DIAGNOSIS — J4540 Moderate persistent asthma, uncomplicated: Secondary | ICD-10-CM | POA: Diagnosis not present

## 2022-05-07 DIAGNOSIS — J3489 Other specified disorders of nose and nasal sinuses: Secondary | ICD-10-CM | POA: Diagnosis not present

## 2022-05-07 MED ORDER — MONTELUKAST SODIUM 10 MG PO TABS: ORAL_TABLET | ORAL | 0 refills | Status: DC

## 2022-05-07 MED ORDER — FLUTICASONE FUROATE-VILANTEROL 200-25 MCG/ACT IN AEPB: 1.0000 | INHALATION_SPRAY | Freq: Every day | RESPIRATORY_TRACT | 5 refills | Status: DC

## 2022-05-07 NOTE — Patient Instructions (Signed)
Asthma Continue Breo 200-1 puff once a day to prevent cough and wheeze Continue montelukast 10 mg once a day to prevent cough or wheeze You may use albuterol 2 puffs once every 4 hours as needed for cough or wheeze You may use albuterol 5 to 15 minutes before activity to decrease cough or wheeze  Seasonal and perennial rhinitis Continue allergen avoidance directed toward indoor and outdoor molds Continue Xyzal 5 mg once a day as needed for runny nose or itch Continue azelastine 2 sprays in each nostril once a day as needed for a runny nose Continue Flonase 2 sprays in each nostril once a day as needed for a stuffy nose.  In the right nostril, point the applicator out toward the right ear. In the left nostril, point the applicator out toward the left ear Do not use Flonase if you are having any bleeding from your nostrils Consider saline nasal rinses as needed for nasal symptoms. Use this before any medicated nasal sprays for best result Consider saline gel for the left nostril or as needed for dry nostrils  Nasal lesion Recommend follow-up with your ENT specialist  Call the clinic if this treatment plan is not working well for you.  Follow up in 6 months or sooner if needed.  Control of Mold Allergen Mold and fungi can grow on a variety of surfaces provided certain temperature and moisture conditions exist.  Outdoor molds grow on plants, decaying vegetation and soil.  The major outdoor mold, Alternaria and Cladosporium, are found in very high numbers during hot and dry conditions.  Generally, a late Summer - Fall peak is seen for common outdoor fungal spores.  Rain will temporarily lower outdoor mold spore count, but counts rise rapidly when the rainy period ends.  The most important indoor molds are Aspergillus and Penicillium.  Dark, humid and poorly ventilated basements are ideal sites for mold growth.  The next most common sites of mold growth are the bathroom and the kitchen.  Outdoor  [employer] Use air conditioning and keep windows closed Avoid exposure to decaying vegetation. Avoid leaf raking. Avoid grain handling. Consider wearing a face mask if working in moldy areas.  Indoor Mold Control Maintain humidity below 50%. Clean washable surfaces with 5% bleach solution. Remove sources e.g. Contaminated carpets.

## 2022-05-07 NOTE — Progress Notes (Signed)
[place], [place] [address] Dept: [phone]  FOLLOW UP NOTE  Patient ID: Stephanie Norris, female    DOB: 1955-03-24  Age: 67 y.o. MRN: [id number] Date of Office Visit: 05/07/2022  Assessment  Chief Complaint: Follow-up  HPI Stephanie Norris is a 67 year old female who presents to the clinic for follow-up visit.  She was last seen in this clinic on 05/24/2021 by Althea Charon, FNP, for evaluation of asthma, chronic rhinitis, and nasal lesion.    At today's visit, she reports that her asthma had been well-controlled, however, she is out of Breo and montelukast and has been experiencing asthma symptoms over the last several weeks including shortness of breath with activity and rest.  She denies wheezing and coughing.  She reports that she generally continues montelukast 10 mg once a day, Breo 200-1 puff once a day and uses albuterol infrequently about once a month with relief of symptoms.    Allergic rhinitis is reported as moderately well-controlled with symptoms including clear rhinorrhea, nasal congestion, and occasional postnasal drainage.  She denies frequent sneezing.  She continues Xyzol 5 mg once a day and azelastine as needed.   Her last environmental allergy testing was on 04/12/2015 and was positive to indoor and outdoor molds.  She does report that she frequently experiences a sore on the inside of her right nostril at the septum.  She reports that this area scabs over and falls off continuously.  She has previously seen an ear nose and throat specialist several years ago.   Her current medications are listed in the chart.   Drug Allergies:  Allergies  Allergen Reactions   Nsaids Anaphylaxis   Bactrim [Sulfamethoxazole-Trimethoprim] Nausea Only    Upset stomach   Cephalexin Rash   Levofloxacin Rash   Macrobid [Nitrofurantoin] Rash    Physical Exam: BP 116/68   Pulse 84   Temp (!) 97.3 F (36.3 C)   Resp 16   Ht 4' 11.75" (1.518 m)   Wt 121 lb  8 oz (55.1 kg)   SpO2 99%   BMI 23.93 kg/m    Physical Exam Vitals reviewed.  Constitutional:      Appearance: Normal appearance.  HENT:     Head: Normocephalic and atraumatic.     Right Ear: Tympanic membrane normal.     Left Ear: Tympanic membrane normal.     Nose:     Comments: Bilateral nares slightly erythematous with clear nasal drainage noted. Pharynx normal. Ears normal. Eyes normal.    Mouth/Throat:     Pharynx: Oropharynx is clear.  Eyes:     Conjunctiva/sclera: Conjunctivae normal.  Cardiovascular:     Rate and Rhythm: Normal rate and regular rhythm.     Heart sounds: Normal heart sounds. No murmur heard. Pulmonary:     Effort: Pulmonary effort is normal.     Breath sounds: Normal breath sounds.     Comments: Lungs clear to auscultation Musculoskeletal:        General: Normal range of motion.     Cervical back: Normal range of motion and neck supple.  Skin:    General: Skin is warm and dry.  Neurological:     Mental Status: She is alert and oriented to person, place, and time.  Psychiatric:        Mood and Affect: Mood normal.        Behavior: Behavior normal.        Thought Content: Thought content normal.  Judgment: Judgment normal.     Diagnostics: FVC 2.06, FEV1 1.46. Predicted FVC 2.40, predicted FEV1 1.88. Spirometry indicates normal ventilatory function  Assessment and Plan: 1. Not well controlled moderate persistent asthma   2. Chronic rhinitis   3. Sore in nostril     Meds ordered this encounter  Medications   montelukast (SINGULAIR) 10 MG tablet    Sig: TAKE 1 TABLET(10 MG) BY MOUTH AT BEDTIME    Dispense:  90 tablet    Refill:  0   fluticasone furoate-vilanterol (BREO ELLIPTA) 200-25 MCG/ACT AEPB    Sig: Inhale 1 puff into the lungs daily.    Dispense:  1 each    Refill:  5    Patient Instructions  Asthma Continue Breo 200-1 puff once a day to prevent cough and wheeze Continue montelukast 10 mg once a day to prevent cough or  wheeze You may use albuterol 2 puffs once every 4 hours as needed for cough or wheeze You may use albuterol 5 to 15 minutes before activity to decrease cough or wheeze  Seasonal and perennial rhinitis Continue allergen avoidance directed toward indoor and outdoor molds Continue Xyzal 5 mg once a day as needed for runny nose or itch Continue azelastine 2 sprays in each nostril once a day as needed for a runny nose Continue Flonase 2 sprays in each nostril once a day as needed for a stuffy nose.  In the right nostril, point the applicator out toward the right ear. In the left nostril, point the applicator out toward the left ear Do not use Flonase if you are having any bleeding from your nostrils Consider saline nasal rinses as needed for nasal symptoms. Use this before any medicated nasal sprays for best result Consider saline gel for the left nostril or as needed for dry nostrils  Nasal lesion Recommend follow-up with your ENT specialist  Call the clinic if this treatment plan is not working well for you.  Follow up in 6 months or sooner if needed.   Return in about 6 months (around 11/06/2022), or if symptoms worsen or fail to improve.    Thank you for the opportunity to care for this patient.  Please do not hesitate to contact me with questions.  Gareth Morgan, FNP [facility]

## 2022-05-15 ENCOUNTER — Encounter: Payer: Self-pay | Admitting: Physician Assistant

## 2022-05-15 ENCOUNTER — Ambulatory Visit: Payer: PPO | Attending: Physician Assistant | Admitting: Physician Assistant

## 2022-05-15 VITALS — BP 128/81 | HR 81 | Resp 16 | Ht 59.75 in | Wt 119.8 lb

## 2022-05-15 DIAGNOSIS — Z1589 Genetic susceptibility to other disease: Secondary | ICD-10-CM | POA: Diagnosis not present

## 2022-05-15 DIAGNOSIS — G8929 Other chronic pain: Secondary | ICD-10-CM

## 2022-05-15 DIAGNOSIS — M19072 Primary osteoarthritis, left ankle and foot: Secondary | ICD-10-CM

## 2022-05-15 DIAGNOSIS — M5136 Other intervertebral disc degeneration, lumbar region: Secondary | ICD-10-CM

## 2022-05-15 DIAGNOSIS — M533 Sacrococcygeal disorders, not elsewhere classified: Secondary | ICD-10-CM

## 2022-05-15 DIAGNOSIS — M797 Fibromyalgia: Secondary | ICD-10-CM | POA: Diagnosis not present

## 2022-05-15 DIAGNOSIS — M7062 Trochanteric bursitis, left hip: Secondary | ICD-10-CM

## 2022-05-15 DIAGNOSIS — Z8639 Personal history of other endocrine, nutritional and metabolic disease: Secondary | ICD-10-CM

## 2022-05-15 DIAGNOSIS — M62838 Other muscle spasm: Secondary | ICD-10-CM

## 2022-05-15 DIAGNOSIS — M8589 Other specified disorders of bone density and structure, multiple sites: Secondary | ICD-10-CM

## 2022-05-15 DIAGNOSIS — Z8659 Personal history of other mental and behavioral disorders: Secondary | ICD-10-CM

## 2022-05-15 DIAGNOSIS — M19042 Primary osteoarthritis, left hand: Secondary | ICD-10-CM

## 2022-05-15 DIAGNOSIS — M0579 Rheumatoid arthritis with rheumatoid factor of multiple sites without organ or systems involvement: Secondary | ICD-10-CM | POA: Diagnosis not present

## 2022-05-15 DIAGNOSIS — Z79899 Other long term (current) drug therapy: Secondary | ICD-10-CM

## 2022-05-15 DIAGNOSIS — Z8669 Personal history of other diseases of the nervous system and sense organs: Secondary | ICD-10-CM

## 2022-05-15 DIAGNOSIS — M5030 Other cervical disc degeneration, unspecified cervical region: Secondary | ICD-10-CM | POA: Diagnosis not present

## 2022-05-15 DIAGNOSIS — M19041 Primary osteoarthritis, right hand: Secondary | ICD-10-CM

## 2022-05-15 DIAGNOSIS — M19071 Primary osteoarthritis, right ankle and foot: Secondary | ICD-10-CM

## 2022-05-15 DIAGNOSIS — Z8709 Personal history of other diseases of the respiratory system: Secondary | ICD-10-CM

## 2022-05-15 DIAGNOSIS — R5383 Other fatigue: Secondary | ICD-10-CM

## 2022-05-15 MED ORDER — METHOTREXATE SODIUM 2.5 MG PO TABS: ORAL_TABLET | ORAL | 0 refills | Status: DC

## 2022-05-15 NOTE — Patient Instructions (Signed)
Hand Exercises Hand exercises can be helpful for almost anyone. These exercises can strengthen the hands, improve flexibility and movement, and increase blood flow to the hands. These results can make work and daily tasks easier. Hand exercises can be especially helpful for people who have joint pain from arthritis or have nerve damage from overuse (carpal tunnel syndrome). These exercises can also help people who have injured a hand. Exercises Most of these hand exercises are gentle stretching and motion exercises. It is usually safe to do them often throughout the day. Warming up your hands before exercise may help to reduce stiffness. You can do this with gentle massage or by placing your hands in warm water for 10-15 minutes. It is normal to feel some stretching, pulling, tightness, or mild discomfort as you begin new exercises. This will gradually improve. Stop an exercise right away if you feel sudden, severe pain or your pain gets worse. Ask your health care provider which exercises are best for you. Knuckle bend or "claw" fist  Stand or sit with your arm, hand, and all five fingers pointed straight up. Make sure to keep your wrist straight during the exercise. Gently bend your fingers down toward your palm until the tips of your fingers are touching the top of your palm. Keep your big knuckle straight and just bend the small knuckles in your fingers. Hold this position for __________ seconds. Straighten (extend) your fingers back to the starting position. Repeat this exercise 5-10 times with each hand. Full finger fist  Stand or sit with your arm, hand, and all five fingers pointed straight up. Make sure to keep your wrist straight during the exercise. Gently bend your fingers into your palm until the tips of your fingers are touching the middle of your palm. Hold this position for __________ seconds. Extend your fingers back to the starting position, stretching every joint fully. Repeat  this exercise 5-10 times with each hand. Straight fist Stand or sit with your arm, hand, and all five fingers pointed straight up. Make sure to keep your wrist straight during the exercise. Gently bend your fingers at the big knuckle, where your fingers meet your hand, and the middle knuckle. Keep the knuckle at the tips of your fingers straight and try to touch the bottom of your palm. Hold this position for __________ seconds. Extend your fingers back to the starting position, stretching every joint fully. Repeat this exercise 5-10 times with each hand. Tabletop  Stand or sit with your arm, hand, and all five fingers pointed straight up. Make sure to keep your wrist straight during the exercise. Gently bend your fingers at the big knuckle, where your fingers meet your hand, as far down as you can while keeping the small knuckles in your fingers straight. Think of forming a tabletop with your fingers. Hold this position for __________ seconds. Extend your fingers back to the starting position, stretching every joint fully. Repeat this exercise 5-10 times with each hand. Finger spread  Place your hand flat on a table with your palm facing down. Make sure your wrist stays straight as you do this exercise. Spread your fingers and thumb apart from each other as far as you can until you feel a gentle stretch. Hold this position for __________ seconds. Bring your fingers and thumb tight together again. Hold this position for __________ seconds. Repeat this exercise 5-10 times with each hand. Making circles  Stand or sit with your arm, hand, and all five fingers pointed   straight up. Make sure to keep your wrist straight during the exercise. Make a circle by touching the tip of your thumb to the tip of your index finger. Hold for __________ seconds. Then open your hand wide. Repeat this motion with your thumb and each finger on your hand. Repeat this exercise 5-10 times with each hand. Thumb  motion  Sit with your forearm resting on a table and your wrist straight. Your thumb should be facing up toward the ceiling. Keep your fingers relaxed as you move your thumb. Lift your thumb up as high as you can toward the ceiling. Hold for __________ seconds. Bend your thumb across your palm as far as you can, reaching the tip of your thumb for the small finger (pinkie) side of your palm. Hold for __________ seconds. Repeat this exercise 5-10 times with each hand. Grip strengthening  Hold a stress ball or other soft ball in the middle of your hand. Slowly increase the pressure, squeezing the ball as much as you can without causing pain. Think of bringing the tips of your fingers into the middle of your palm. All of your finger joints should bend when doing this exercise. Hold your squeeze for __________ seconds, then relax. Repeat this exercise 5-10 times with each hand. Contact a health care provider if: Your hand pain or discomfort gets much worse when you do an exercise. Your hand pain or discomfort does not improve within 2 hours after you exercise. If you have any of these problems, stop doing these exercises right away. Do not do them again unless your health care provider says that you can. Get help right away if: You develop sudden, severe hand pain or swelling. If this happens, stop doing these exercises right away. Do not do them again unless your health care provider says that you can. This information is not intended to replace advice given to you by your health care provider. Make sure you discuss any questions you have with your health care provider. Document Revised: 05/05/2020 Document Reviewed: 05/12/2020 Elsevier Patient Education  2023 [employer]  

## 2022-05-16 LAB — COMPLETE METABOLIC PANEL WITH GFR
AG Ratio: 1.7 (calc) (ref 1.0–2.5)
ALT: 24 U/L (ref 6–29)
AST: 20 U/L (ref 10–35)
Albumin: 4.3 g/dL (ref 3.6–5.1)
Alkaline phosphatase (APISO): 64 U/L (ref 37–153)
BUN: 21 mg/dL (ref 7–25)
CO2: 17 mmol/L — ABNORMAL LOW (ref 20–32)
Calcium: 10 mg/dL (ref 8.6–10.4)
Chloride: 108 mmol/L (ref 98–110)
Creat: 0.78 mg/dL (ref 0.50–1.05)
Globulin: 2.5 g/dL (calc) (ref 1.9–3.7)
Glucose, Bld: 58 mg/dL — ABNORMAL LOW (ref 65–99)
Potassium: 3.7 mmol/L (ref 3.5–5.3)
Sodium: 140 mmol/L (ref 135–146)
Total Bilirubin: 0.3 mg/dL (ref 0.2–1.2)
Total Protein: 6.8 g/dL (ref 6.1–8.1)
eGFR: 82 mL/min/{1.73_m2} (ref >=60)

## 2022-05-16 LAB — CBC WITH DIFFERENTIAL/PLATELET
Absolute Monocytes: 902 {cells}/uL (ref 200–950)
Basophils Absolute: 81 {cells}/uL (ref 0–200)
Basophils Relative: 0.5 %
Eosinophils Absolute: 242 {cells}/uL (ref 15–500)
Eosinophils Relative: 1.5 %
HCT: 40.5 % (ref 35.0–45.0)
Hemoglobin: 13.6 g/dL (ref 11.7–15.5)
Lymphs Abs: 6070 {cells}/uL — ABNORMAL HIGH (ref 850–3900)
MCH: 31.3 pg (ref 27.0–33.0)
MCHC: 33.6 g/dL (ref 32.0–36.0)
MCV: 93.1 fL (ref 80.0–100.0)
MPV: 11.3 fL (ref 7.5–12.5)
Monocytes Relative: 5.6 %
Neutro Abs: 8807 {cells}/uL — ABNORMAL HIGH (ref 1500–7800)
Neutrophils Relative %: 54.7 %
Platelets: 458 Thousand/uL — ABNORMAL HIGH (ref 140–400)
RBC: 4.35 Million/uL (ref 3.80–5.10)
RDW: 14.4 % (ref 11.0–15.0)
Total Lymphocyte: 37.7 %
WBC: 16.1 Thousand/uL — ABNORMAL HIGH (ref 3.8–10.8)

## 2022-05-16 NOTE — Progress Notes (Signed)
CMP stable.  WBC count is elevated-absolute neutrophil and lymphocytes are elevated. Plt count remains elevated. Please clarify if she has had a recent infection?

## 2022-05-18 ENCOUNTER — Telehealth: Payer: Self-pay | Admitting: *Deleted

## 2022-05-18 ENCOUNTER — Encounter: Payer: Self-pay | Admitting: *Deleted

## 2022-05-18 DIAGNOSIS — Z79899 Other long term (current) drug therapy: Secondary | ICD-10-CM

## 2022-05-18 NOTE — Telephone Encounter (Signed)
-----   Message from Gearldine Bienenstock, PA-C sent at 05/18/2022 11:09 AM EDT ----- Recommend rechecking CBC with diff in 1 month

## 2022-05-18 NOTE — Progress Notes (Signed)
Recommend rechecking CBC with diff in 1 month

## 2022-06-06 ENCOUNTER — Other Ambulatory Visit: Payer: Self-pay

## 2022-06-06 MED ORDER — FLUTICASONE FUROATE-VILANTEROL 200-25 MCG/ACT IN AEPB: 1.0000 | INHALATION_SPRAY | Freq: Every day | RESPIRATORY_TRACT | 5 refills | Status: DC

## 2022-06-06 MED ORDER — AZELASTINE HCL 0.1 % NA SOLN: NASAL | 2 refills | Status: DC

## 2022-06-06 NOTE — Telephone Encounter (Signed)
Patient called in requesting breo and astelin nasal spray to be sent to [facility]. I have sent those 2 meds in.

## 2022-06-07 ENCOUNTER — Other Ambulatory Visit (HOSPITAL_COMMUNITY): Payer: Self-pay

## 2022-06-07 DIAGNOSIS — E782 Mixed hyperlipidemia: Secondary | ICD-10-CM | POA: Diagnosis not present

## 2022-06-07 DIAGNOSIS — E1140 Type 2 diabetes mellitus with diabetic neuropathy, unspecified: Secondary | ICD-10-CM | POA: Diagnosis not present

## 2022-06-07 DIAGNOSIS — E039 Hypothyroidism, unspecified: Secondary | ICD-10-CM | POA: Diagnosis not present

## 2022-06-13 DIAGNOSIS — Z0001 Encounter for general adult medical examination with abnormal findings: Secondary | ICD-10-CM | POA: Diagnosis not present

## 2022-06-13 DIAGNOSIS — G4733 Obstructive sleep apnea (adult) (pediatric): Secondary | ICD-10-CM | POA: Diagnosis not present

## 2022-06-13 DIAGNOSIS — E782 Mixed hyperlipidemia: Secondary | ICD-10-CM | POA: Diagnosis not present

## 2022-06-13 DIAGNOSIS — M545 Low back pain, unspecified: Secondary | ICD-10-CM | POA: Diagnosis not present

## 2022-06-13 DIAGNOSIS — F331 Major depressive disorder, recurrent, moderate: Secondary | ICD-10-CM | POA: Diagnosis not present

## 2022-06-13 DIAGNOSIS — J4520 Mild intermittent asthma, uncomplicated: Secondary | ICD-10-CM | POA: Diagnosis not present

## 2022-06-13 DIAGNOSIS — E039 Hypothyroidism, unspecified: Secondary | ICD-10-CM | POA: Diagnosis not present

## 2022-06-13 DIAGNOSIS — K219 Gastro-esophageal reflux disease without esophagitis: Secondary | ICD-10-CM | POA: Diagnosis not present

## 2022-06-13 DIAGNOSIS — G8929 Other chronic pain: Secondary | ICD-10-CM | POA: Diagnosis not present

## 2022-06-13 DIAGNOSIS — I10 Essential (primary) hypertension: Secondary | ICD-10-CM | POA: Diagnosis not present

## 2022-06-13 DIAGNOSIS — J302 Other seasonal allergic rhinitis: Secondary | ICD-10-CM | POA: Diagnosis not present

## 2022-06-13 DIAGNOSIS — E1140 Type 2 diabetes mellitus with diabetic neuropathy, unspecified: Secondary | ICD-10-CM | POA: Diagnosis not present

## 2022-06-29 DIAGNOSIS — K219 Gastro-esophageal reflux disease without esophagitis: Secondary | ICD-10-CM | POA: Diagnosis not present

## 2022-06-29 DIAGNOSIS — J4520 Mild intermittent asthma, uncomplicated: Secondary | ICD-10-CM | POA: Diagnosis not present

## 2022-06-29 DIAGNOSIS — I10 Essential (primary) hypertension: Secondary | ICD-10-CM | POA: Diagnosis not present

## 2022-06-29 DIAGNOSIS — M545 Low back pain, unspecified: Secondary | ICD-10-CM | POA: Diagnosis not present

## 2022-06-29 DIAGNOSIS — G9009 Other idiopathic peripheral autonomic neuropathy: Secondary | ICD-10-CM | POA: Diagnosis not present

## 2022-06-29 DIAGNOSIS — J302 Other seasonal allergic rhinitis: Secondary | ICD-10-CM | POA: Diagnosis not present

## 2022-06-29 DIAGNOSIS — K2970 Gastritis, unspecified, without bleeding: Secondary | ICD-10-CM | POA: Diagnosis not present

## 2022-06-29 DIAGNOSIS — Z6822 Body mass index (BMI) 22.0-22.9, adult: Secondary | ICD-10-CM | POA: Diagnosis not present

## 2022-06-29 DIAGNOSIS — M069 Rheumatoid arthritis, unspecified: Secondary | ICD-10-CM | POA: Diagnosis not present

## 2022-06-29 DIAGNOSIS — F331 Major depressive disorder, recurrent, moderate: Secondary | ICD-10-CM | POA: Diagnosis not present

## 2022-06-29 DIAGNOSIS — Z713 Dietary counseling and surveillance: Secondary | ICD-10-CM | POA: Diagnosis not present

## 2022-08-06 ENCOUNTER — Telehealth: Payer: Self-pay

## 2022-08-06 MED ORDER — MONTELUKAST SODIUM 10 MG PO TABS: ORAL_TABLET | ORAL | 1 refills | Status: DC

## 2022-08-06 NOTE — Telephone Encounter (Signed)
Patient is requesting that her singulair be sent to [facility].

## 2022-08-06 NOTE — Telephone Encounter (Signed)
Patient medication refill on Montelukast (Singulair) 10 mg was electronically sent to [facility] per patient request.   Called patient - DOB/no DPR on file - LMOVM to check myChart or contact [facility] office regarding her medication refill request.

## 2022-08-11 ENCOUNTER — Other Ambulatory Visit: Payer: Self-pay | Admitting: Family Medicine

## 2022-08-17 ENCOUNTER — Other Ambulatory Visit: Payer: Self-pay | Admitting: *Deleted

## 2022-08-17 ENCOUNTER — Other Ambulatory Visit: Payer: Self-pay | Admitting: Physician Assistant

## 2022-08-17 DIAGNOSIS — Z1589 Genetic susceptibility to other disease: Secondary | ICD-10-CM

## 2022-08-17 DIAGNOSIS — M0579 Rheumatoid arthritis with rheumatoid factor of multiple sites without organ or systems involvement: Secondary | ICD-10-CM

## 2022-08-17 DIAGNOSIS — Z79899 Other long term (current) drug therapy: Secondary | ICD-10-CM

## 2022-08-17 NOTE — Telephone Encounter (Signed)
Last Fill: 05/15/2022  Labs: 05/15/2022 CMP stable. WBC count is elevated-absolute neutrophil and lymphocytes are elevated. Plt count remains elevated. Please clarify if she has had a recent infection? Recommend rechecking CBC with diff in 1 month. I called patient, patient will go to [facility] in [place] to have labs drawn 08/20/2022, labs released.  Next Visit: 10/31/2022  Last Visit: 05/15/2022  DX: Rheumatoid arthritis with rheumatoid factor of multiple sites without organ or systems involvement   Current Dose per office note 05/15/2022: Methotrexate 6 tablets by mouth every 7 days   Okay to refill Methotrexate?

## 2022-08-23 DIAGNOSIS — Z79899 Other long term (current) drug therapy: Secondary | ICD-10-CM | POA: Diagnosis not present

## 2022-08-23 DIAGNOSIS — M0579 Rheumatoid arthritis with rheumatoid factor of multiple sites without organ or systems involvement: Secondary | ICD-10-CM | POA: Diagnosis not present

## 2022-08-23 DIAGNOSIS — Z1589 Genetic susceptibility to other disease: Secondary | ICD-10-CM | POA: Diagnosis not present

## 2022-08-24 LAB — CBC WITH DIFFERENTIAL/PLATELET
Absolute Monocytes: 559 cells/uL (ref 200–950)
Basophils Absolute: 80 cells/uL (ref 0–200)
Basophils Relative: 0.7 %
Eosinophils Absolute: 217 cells/uL (ref 15–500)
Eosinophils Relative: 1.9 %
HCT: 40.4 % (ref 35.0–45.0)
Hemoglobin: 13.6 g/dL (ref 11.7–15.5)
Lymphs Abs: 2451 cells/uL (ref 850–3900)
MCH: 32.2 pg (ref 27.0–33.0)
MCHC: 33.7 g/dL (ref 32.0–36.0)
MCV: 95.5 fL (ref 80.0–100.0)
MPV: 11.2 fL (ref 7.5–12.5)
Monocytes Relative: 4.9 %
Neutro Abs: 8094 cells/uL — ABNORMAL HIGH (ref 1500–7800)
Neutrophils Relative %: 71 %
Platelets: 375 10*3/uL (ref 140–400)
RBC: 4.23 10*6/uL (ref 3.80–5.10)
RDW: 14.1 % (ref 11.0–15.0)
Total Lymphocyte: 21.5 %
WBC: 11.4 10*3/uL — ABNORMAL HIGH (ref 3.8–10.8)

## 2022-08-24 LAB — COMPLETE METABOLIC PANEL WITH GFR
AG Ratio: 1.8 (calc) (ref 1.0–2.5)
ALT: 22 U/L (ref 6–29)
AST: 16 U/L (ref 10–35)
Albumin: 4.4 g/dL (ref 3.6–5.1)
Alkaline phosphatase (APISO): 70 U/L (ref 37–153)
BUN: 23 mg/dL (ref 7–25)
CO2: 18 mmol/L — ABNORMAL LOW (ref 20–32)
Calcium: 10.2 mg/dL (ref 8.6–10.4)
Chloride: 109 mmol/L (ref 98–110)
Creat: 0.81 mg/dL (ref 0.50–1.05)
Globulin: 2.5 g/dL (calc) (ref 1.9–3.7)
Glucose, Bld: 179 mg/dL — ABNORMAL HIGH (ref 65–139)
Potassium: 4.4 mmol/L (ref 3.5–5.3)
Sodium: 142 mmol/L (ref 135–146)
Total Bilirubin: 0.3 mg/dL (ref 0.2–1.2)
Total Protein: 6.9 g/dL (ref 6.1–8.1)
eGFR: 79 mL/min/{1.73_m2} (ref >=60)

## 2022-08-24 NOTE — Progress Notes (Signed)
Glucose is elevated.White cell count is elevated and stable.

## 2022-08-29 ENCOUNTER — Ambulatory Visit: Payer: PPO | Admitting: Podiatry

## 2022-09-05 ENCOUNTER — Ambulatory Visit: Payer: PPO | Admitting: Podiatry

## 2022-09-05 DIAGNOSIS — Q667 Congenital pes cavus, unspecified foot: Secondary | ICD-10-CM

## 2022-09-05 NOTE — Progress Notes (Signed)
Subjective:  Patient ID: Stephanie Norris, female    DOB: April 13, 1955,  MRN: [id number]  Chief Complaint  Patient presents with   Foot Pain    67 y.o. female presents with the above complaint.  Patient presents with bilateral ball of the foot pain.  She has a very hide foot structure.  She states that it just keeps hurting she has fibromyalgia she is a diabetic she wanted get it evaluate she wears regular shoes without any orthotics.  She has not seen MRIs prior to seeing me.  She would like to discuss orthotics option pain scale 7 out of 10 dull achy in nature   Review of Systems: Negative except as noted in the HPI. Denies N/V/F/Ch.  Past Medical History:  Diagnosis Date   Asthma    COPD (chronic obstructive pulmonary disease) (HCC)    Diabetes mellitus without complication (HCC)    Type 2   Dizziness    Fibromyalgia    GERD (gastroesophageal reflux disease)    Headache    Hyperlipidemia    OSA (obstructive sleep apnea)    Osteoarthritis    PCOS (polycystic ovarian syndrome)    Raynaud disease    Rheumatoid arthritis (HCC)     Current Outpatient Medications:    albuterol (VENTOLIN HFA) 108 (90 Base) MCG/ACT inhaler, Inhale 2 puffs into the lungs every 4 (four) hours as needed for wheezing or shortness of breath., Disp: 18 g, Rfl: 1   azelastine (ASTELIN) 0.1 % nasal spray, PLACE 1 SPRAY IN EACH NOSTRIL TWICE DAILY AS NEEDED FOR RUNNY DRIPPY NOSE, Disp: 30 mL, Rfl: 2   clobetasol ointment (TEMOVATE) 0.05 %, as needed. (Patient not taking: Reported on 11/22/2021), Disp: , Rfl:    Continuous Blood Gluc Sensor (FREESTYLE LIBRE SENSOR SYSTEM) MISC, as needed., Disp: , Rfl: 3   dapagliflozin propanediol (FARXIGA) 10 MG TABS tablet, Take 10 mg by mouth daily., Disp: , Rfl:    diclofenac sodium (VOLTAREN) 1 % GEL, Apply 3 grams to three large joints up to three times daily as needed, Disp: 3 Tube, Rfl: 3   DULoxetine (CYMBALTA) 60 MG capsule, TAKE 1 CAPSULE BY MOUTH EVERY DAY, Disp: 90  capsule, Rfl: 0   empagliflozin (JARDIANCE) 25 MG TABS tablet, Jardiance 25 mg tablet  TAKE 1 TABLET BY MOUTH EVERY DAY (Patient not taking: Reported on 11/22/2021), Disp: , Rfl:    ezetimibe (ZETIA) 10 MG tablet, Take 10 mg by mouth daily., Disp: , Rfl:    fluticasone furoate-vilanterol (BREO ELLIPTA) 200-25 MCG/ACT AEPB, Inhale 1 puff into the lungs daily., Disp: 1 each, Rfl: 5   folic acid (FOLVITE) 1 MG tablet, folic acid 1 mg tablet  Take 1 tablet every day by oral route., Disp: , Rfl:    furosemide (LASIX) 20 MG tablet, , Disp: , Rfl:    glipiZIDE (GLUCOTROL) 5 MG tablet, Take 5 mg by mouth daily., Disp: , Rfl:    levothyroxine (SYNTHROID) 50 MCG tablet, Take 50 mcg by mouth daily., Disp: , Rfl:    liothyronine (CYTOMEL) 5 MCG tablet, Take 5 mcg by mouth daily., Disp: , Rfl:    Loratadine (CLARITIN PO), Take by mouth daily., Disp: , Rfl:    Melatonin 10 MG TABS, , Disp: , Rfl:    metFORMIN (GLUCOPHAGE-XR) 500 MG 24 hr tablet, Take 1,000 mg by mouth 2 (two) times daily. , Disp: , Rfl:    methotrexate (RHEUMATREX) 2.5 MG tablet, TAKE 6 TABLETS BY MOUTH 1 TIME A WEEK., Disp: 24  tablet, Rfl: 0   montelukast (SINGULAIR) 10 MG tablet, TAKE 1 TABLET(10 MG) BY MOUTH AT BEDTIME, Disp: 90 tablet, Rfl: 1   Multiple Vitamins-Minerals (MULTIVITAMIN PO), Take by mouth., Disp: , Rfl:    omega-3 acid ethyl esters (LOVAZA) 1 g capsule, Take 2 capsules by mouth 2 (two) times daily. (Patient not taking: Reported on 05/07/2022), Disp: , Rfl:    ONETOUCH VERIO test strip, , Disp: , Rfl:    pantoprazole (PROTONIX) 40 MG tablet, Take 1 tablet by mouth 2 (two) times daily. , Disp: , Rfl: 5   progesterone (PROMETRIUM) 100 MG capsule, Take 100 mg by mouth at bedtime., Disp: , Rfl:    testosterone (ANDROGEL) 50 MG/5GM (1%) GEL, Place 5 g onto the skin daily. (Patient not taking: Reported on 05/07/2022), Disp: , Rfl:    valACYclovir (VALTREX) 1000 MG tablet, SMARTSIG:2 Tablet(s) By Mouth Every 12 Hours PRN, Disp: , Rfl:    Social History   Tobacco Use  Smoking Status Never   Passive exposure: Never  Smokeless Tobacco Never    Allergies  Allergen Reactions   Nsaids Anaphylaxis    Patient states she can tolerate ibuprofen.    Bactrim [Sulfamethoxazole-Trimethoprim] Nausea Only    Upset stomach   Cephalexin Rash   Levofloxacin Rash   Objective:  There were no vitals filed for this visit. There is no height or weight on file to calculate BMI. Constitutional Well developed. Well nourished.  Vascular Dorsalis pedis pulses palpable bilaterally. Posterior tibial pulses palpable bilaterally. Capillary refill normal to all digits.  No cyanosis or clubbing noted. Pedal hair growth normal.  Neurologic Normal speech. Oriented to person, place, and time. Epicritic sensation to light touch grossly present bilaterally.  Dermatologic Nails well groomed and normal in appearance. No open wounds. No skin lesions.  Orthopedic: Pes cavus foot structure semirigid excessive pressure to the ball of the foot anterior cavus foot structure noted.  No open wounds or lesion noted.  Plantar fat pad atrophy noted.   Radiographs: None Assessment:   1. Pes cavus    Plan:  Patient was evaluated and treated and all questions answered.  Bilateral pes cavus deformity with forefoot metatarsalgia -All questions or concerns were discussed with the patient extensive detail -Given the amount of pain that she is unsure benefit from orthotics.  I discussed shoe gear modification she was casted for orthotics with metatarsal pads to address some metatarsalgia of the forefoot. -Patient was casted for orthotics with metatarsal pads  No follow-ups on file.

## 2022-09-29 ENCOUNTER — Other Ambulatory Visit: Payer: Self-pay

## 2022-09-29 ENCOUNTER — Emergency Department (HOSPITAL_COMMUNITY)
Admission: EM | Admit: 2022-09-29 | Discharge: 2022-09-29 | Disposition: A | Discharge status: Home / Self Care | Payer: PPO | Attending: Emergency Medicine | Admitting: Emergency Medicine

## 2022-09-29 DIAGNOSIS — I10 Essential (primary) hypertension: Secondary | ICD-10-CM | POA: Insufficient documentation

## 2022-09-29 DIAGNOSIS — Z79899 Other long term (current) drug therapy: Secondary | ICD-10-CM | POA: Diagnosis not present

## 2022-09-29 DIAGNOSIS — H4311 Vitreous hemorrhage, right eye: Secondary | ICD-10-CM | POA: Diagnosis not present

## 2022-09-29 DIAGNOSIS — H5789 Other specified disorders of eye and adnexa: Secondary | ICD-10-CM | POA: Diagnosis present

## 2022-09-29 NOTE — Discharge Instructions (Signed)
If the vision is getting worse - call the office number above and they will get you in earlier.  Otherwise go to the office on Monday morning at 8:00 and they will work you in.

## 2022-09-29 NOTE — ED Notes (Signed)
Corrective visual acuity: Right eye 20/15, Left eye 20/20

## 2022-09-29 NOTE — ED Provider Notes (Signed)
[place] [facility] AT [facility] Provider Note   CSN: [id number] Arrival date & time: 09/29/22  1814     History  Chief Complaint  Patient presents with   Eye Problem    Stephanie Norris is a 67 y.o. female.   Eye Problem  This patient is a 67 year old female, she wears corrective lenses, she states that last night while she was doing some light housework she developed a feeling of sparkling lights in her right eye and shortly thereafter had some cobwebs and a couple of black spots in her vision.  She has normal pupillary exam, she does not complain of any eye pain, no headache, no numbness or weakness, it appears that these webs and spots or floaters across her vision.  They have been present all day long since last night.  Tried to call her optometrist but had no response    Home Medications Prior to Admission medications   Medication Sig Start Date End Date Taking? Authorizing Provider  albuterol (VENTOLIN HFA) 108 (90 Base) MCG/ACT inhaler Inhale 2 puffs into the lungs every 4 (four) hours as needed for wheezing or shortness of breath. 08/31/20   Nehemiah Settle, FNP  azelastine (ASTELIN) 0.1 % nasal spray PLACE 1 SPRAY IN EACH NOSTRIL TWICE DAILY AS NEEDED FOR RUNNY DRIPPY NOSE 06/06/22   Ambs, Norvel Richards, FNP  clobetasol ointment (TEMOVATE) 0.05 % as needed. Patient not taking: Reported on 11/22/2021    [provider]  Continuous Blood Gluc Sensor (FREESTYLE LIBRE SENSOR SYSTEM) MISC as needed. 02/12/17   [provider]  dapagliflozin propanediol (FARXIGA) 10 MG TABS tablet Take 10 mg by mouth daily.    [provider]  diclofenac sodium (VOLTAREN) 1 % GEL Apply 3 grams to three large joints up to three times daily as needed 02/21/17   Gearldine Bienenstock, PA-C  DULoxetine (CYMBALTA) 60 MG capsule TAKE 1 CAPSULE BY MOUTH EVERY DAY 09/06/17   Pollyann Savoy, MD  empagliflozin (JARDIANCE) 25 MG TABS tablet Jardiance 25 mg tablet  TAKE 1  TABLET BY MOUTH EVERY DAY Patient not taking: Reported on 11/22/2021    [provider]  ezetimibe (ZETIA) 10 MG tablet Take 10 mg by mouth daily. 05/09/22   [provider]  fluticasone furoate-vilanterol (BREO ELLIPTA) 200-25 MCG/ACT AEPB Inhale 1 puff into the lungs daily. 06/06/22   Hetty Blend, FNP  folic acid (FOLVITE) 1 MG tablet folic acid 1 mg tablet  Take 1 tablet every day by oral route.    [provider]  furosemide (LASIX) 20 MG tablet     [provider]  glipiZIDE (GLUCOTROL) 5 MG tablet Take 5 mg by mouth daily. 12/17/19   [provider]  levothyroxine (SYNTHROID) 50 MCG tablet Take 50 mcg by mouth daily. 04/19/22   [provider]  liothyronine (CYTOMEL) 5 MCG tablet Take 5 mcg by mouth daily. 01/25/20   [provider]  Loratadine (CLARITIN PO) Take by mouth daily.    [provider]  Melatonin 10 MG TABS     [provider]  metFORMIN (GLUCOPHAGE-XR) 500 MG 24 hr tablet Take 1,000 mg by mouth 2 (two) times daily.  01/04/16   [provider]  methotrexate (RHEUMATREX) 2.5 MG tablet TAKE 6 TABLETS BY MOUTH 1 TIME A WEEK. 08/18/22   Pollyann Savoy, MD  montelukast (SINGULAIR) 10 MG tablet TAKE 1 TABLET(10 MG) BY MOUTH AT BEDTIME 08/06/22   Ambs, Norvel Richards, FNP  Multiple  Vitamins-Minerals (MULTIVITAMIN PO) Take by mouth.    [provider]  omega-3 acid ethyl esters (LOVAZA) 1 g capsule Take 2 capsules by mouth 2 (two) times daily. Patient not taking: Reported on 05/07/2022 03/11/18   [provider]  [facility] VERIO test strip  01/04/16   [provider]  pantoprazole (PROTONIX) 40 MG tablet Take 1 tablet by mouth 2 (two) times daily.  07/14/14   [provider]  progesterone (PROMETRIUM) 100 MG capsule Take 100 mg by mouth at bedtime. 02/22/20   [provider]  testosterone (ANDROGEL) 50 MG/5GM (1%) GEL Place 5 g onto the skin daily. Patient not taking:  Reported on 05/07/2022    [provider]  valACYclovir (VALTREX) 1000 MG tablet SMARTSIG:2 Tablet(s) By Mouth Every 12 Hours PRN 11/12/18   [provider]      Allergies    Nsaids, Bactrim [sulfamethoxazole-trimethoprim], Cephalexin, and Levofloxacin    Review of Systems   Review of Systems  All other systems reviewed and are negative.   Physical Exam Updated Vital Signs Temp 98.6 F (37 C) (Oral)   Ht 1.499 m (4\' 11" )   Wt 54 kg   BMI 24.04 kg/m  Physical Exam Vitals and nursing note reviewed.  Constitutional:      General: She is not in acute distress.    Appearance: She is well-developed.  HENT:     Head: Normocephalic and atraumatic.     Mouth/Throat:     Pharynx: No oropharyngeal exudate.  Eyes:     General: No scleral icterus.       Right eye: No discharge.        Left eye: No discharge.     Conjunctiva/sclera: Conjunctivae normal.     Pupils: Pupils are equal, round, and reactive to light.     Comments: Posterior exam is somewhat limited but the optic disc appears clear bilaterally, in the right eye there does appear to be a couple of tiny hemorrhages on the arteries in the back of the eye but no obvious retinal detachment.  Neck:     Thyroid: No thyromegaly.     Vascular: No JVD.  Cardiovascular:     Rate and Rhythm: Normal rate and regular rhythm.     Heart sounds: Normal heart sounds. No murmur heard.    No friction rub. No gallop.  Pulmonary:     Effort: Pulmonary effort is normal. No respiratory distress.     Breath sounds: Normal breath sounds. No wheezing or rales.  Abdominal:     General: Bowel sounds are normal. There is no distension.     Palpations: Abdomen is soft. There is no mass.     Tenderness: There is no abdominal tenderness.  Musculoskeletal:        General: No tenderness. Normal range of motion.     Cervical back: Normal range of motion and neck supple.  Lymphadenopathy:     Cervical: No cervical adenopathy.  Skin:     General: Skin is warm and dry.     Findings: No erythema or rash.  Neurological:     Mental Status: She is alert.     Coordination: Coordination normal.  Psychiatric:        Behavior: Behavior normal.     ED Results / Procedures / Treatments   Labs (all labs ordered are listed, but only abnormal results are displayed) Labs Reviewed - No data to display  EKG None  Radiology No results found.  Procedures Procedures  Medications Ordered in ED Medications - No data to display  ED Course/ Medical Decision Making/ A&P                                 Medical Decision Making  Will discuss with ophthalmology, vital signs are unremarkable except for some hypertension, the patient is otherwise well-appearing, this could be retinal, this could be posterior vitreous, the patient is a diabetic so undoubtedly there is some hypertensive retinopathy.  D/w Dr. Nada Libman - will see on Monday -  Pt agreeable. Likely just vitreous hemorrhage -  No restritions per optho F/u MOnday 8 AM, F/u information given.        Final Clinical Impression(s) / ED Diagnoses Final diagnoses:  Vitreous hemorrhage of right eye [facility])    Rx / DC Orders ED Discharge Orders     None         Eber Hong, MD 09/29/22 1900

## 2022-09-29 NOTE — ED Triage Notes (Signed)
Pt got a cob web in eye afternoon and then last night was seeing flashes of light, pt states eye feels irritated, pt denies seeing flashes of light today

## 2022-10-02 DIAGNOSIS — H4311 Vitreous hemorrhage, right eye: Secondary | ICD-10-CM | POA: Diagnosis not present

## 2022-10-02 DIAGNOSIS — H43822 Vitreomacular adhesion, left eye: Secondary | ICD-10-CM | POA: Diagnosis not present

## 2022-10-02 DIAGNOSIS — H43811 Vitreous degeneration, right eye: Secondary | ICD-10-CM | POA: Diagnosis not present

## 2022-10-11 DIAGNOSIS — E1140 Type 2 diabetes mellitus with diabetic neuropathy, unspecified: Secondary | ICD-10-CM | POA: Diagnosis not present

## 2022-10-11 DIAGNOSIS — E782 Mixed hyperlipidemia: Secondary | ICD-10-CM | POA: Diagnosis not present

## 2022-10-11 DIAGNOSIS — E039 Hypothyroidism, unspecified: Secondary | ICD-10-CM | POA: Diagnosis not present

## 2022-10-15 DIAGNOSIS — M069 Rheumatoid arthritis, unspecified: Secondary | ICD-10-CM | POA: Diagnosis not present

## 2022-10-15 DIAGNOSIS — M545 Low back pain, unspecified: Secondary | ICD-10-CM | POA: Diagnosis not present

## 2022-10-15 DIAGNOSIS — E1165 Type 2 diabetes mellitus with hyperglycemia: Secondary | ICD-10-CM | POA: Diagnosis not present

## 2022-10-15 DIAGNOSIS — K2970 Gastritis, unspecified, without bleeding: Secondary | ICD-10-CM | POA: Diagnosis not present

## 2022-10-15 DIAGNOSIS — J302 Other seasonal allergic rhinitis: Secondary | ICD-10-CM | POA: Diagnosis not present

## 2022-10-15 DIAGNOSIS — I10 Essential (primary) hypertension: Secondary | ICD-10-CM | POA: Diagnosis not present

## 2022-10-15 DIAGNOSIS — K219 Gastro-esophageal reflux disease without esophagitis: Secondary | ICD-10-CM | POA: Diagnosis not present

## 2022-10-15 DIAGNOSIS — G9009 Other idiopathic peripheral autonomic neuropathy: Secondary | ICD-10-CM | POA: Diagnosis not present

## 2022-10-15 DIAGNOSIS — E1143 Type 2 diabetes mellitus with diabetic autonomic (poly)neuropathy: Secondary | ICD-10-CM | POA: Diagnosis not present

## 2022-10-15 DIAGNOSIS — E782 Mixed hyperlipidemia: Secondary | ICD-10-CM | POA: Diagnosis not present

## 2022-10-15 DIAGNOSIS — J4520 Mild intermittent asthma, uncomplicated: Secondary | ICD-10-CM | POA: Diagnosis not present

## 2022-10-15 DIAGNOSIS — F331 Major depressive disorder, recurrent, moderate: Secondary | ICD-10-CM | POA: Diagnosis not present

## 2022-10-18 DIAGNOSIS — L7211 Pilar cyst: Secondary | ICD-10-CM | POA: Diagnosis not present

## 2022-10-18 NOTE — Progress Notes (Deleted)
Office Visit Note  Patient: Stephanie Norris             Date of Birth: Apr 18, 1955           MRN: [id number]             PCP: Benita Stabile, MD Referring: Benita Stabile, MD Visit Date: 10/31/2022 Occupation: @GUAROCC @  Subjective:  No chief complaint on file.   History of Present Illness: Stephanie Norris is a 67 y.o. female ***     Activities of Daily Living:  Patient reports morning stiffness for *** {minute/hour:19697}.   Patient {ACTIONS;DENIES/REPORTS:21021675::"Denies"} nocturnal pain.  Difficulty dressing/grooming: {ACTIONS;DENIES/REPORTS:21021675::"Denies"} Difficulty climbing stairs: {ACTIONS;DENIES/REPORTS:21021675::"Denies"} Difficulty getting out of chair: {ACTIONS;DENIES/REPORTS:21021675::"Denies"} Difficulty using hands for taps, buttons, cutlery, and/or writing: {ACTIONS;DENIES/REPORTS:21021675::"Denies"}  No Rheumatology ROS completed.   PMFS History:  Patient Active Problem List   Diagnosis Date Noted   Chronic rhinitis 05/07/2022   Sore in nostril 05/07/2022   Mucous cyst of digit of left hand 04/07/2020   Vertigo 08/20/2016   Eustachian tube dysfunction 04/17/2016   Abdominal pain 03/02/2016   Gastroenteritis 03/02/2016   Rheumatoid arthritis with rheumatoid factor of multiple sites without organ or systems involvement (HCC) 01/13/2016   HLA B27 (HLA B27 positive) 01/13/2016   Osteoarthritis of foot 01/13/2016   DJD (degenerative joint disease), cervical 01/13/2016   Spondylosis of lumbar region without myelopathy or radiculopathy 01/13/2016   Primary osteoarthritis of both hands 01/13/2016   Non-seasonal allergic rhinitis 08/13/2014   Stiffness of joints, not elsewhere classified, multiple sites 08/26/2013   Flu-like symptoms 03/02/2013   Lactic acidosis 03/02/2013   Sinus tachycardia 03/02/2013   Elevated lactic acid level 03/02/2013   Influenza due to identified novel influenza A virus with other respiratory manifestations 03/02/2013    Hyperglycemia 03/02/2013   Fibromyalgia    OSA (obstructive sleep apnea) 06/22/2011   DOE (dyspnea on exertion) 05/30/2011   Not well controlled moderate persistent asthma 03/30/2011    Past Medical History:  Diagnosis Date   Asthma    COPD (chronic obstructive pulmonary disease) (HCC)    Diabetes mellitus without complication (HCC)    Type 2   Dizziness    Fibromyalgia    GERD (gastroesophageal reflux disease)    Headache    Hyperlipidemia    OSA (obstructive sleep apnea)    Osteoarthritis    PCOS (polycystic ovarian syndrome)    Raynaud disease    Rheumatoid arthritis (HCC)     Family History  Problem Relation Age of Onset   Asthma Mother    Rheum arthritis Mother    Allergic rhinitis Mother    Rheum arthritis Sister    Allergic rhinitis Sister    Heart failure Father    Heart disease Father    Rheum arthritis Sister    Allergic rhinitis Sister    Allergic rhinitis Brother    Allergies Other        "Everyone"   Heart disease Maternal Grandfather    Heart disease Maternal Grandmother    Heart disease Paternal Grandfather    Heart disease Paternal Grandmother    Past Surgical History:  Procedure Laterality Date   APPENDECTOMY     BACK SURGERY  2021   CHOLECYSTECTOMY     COLONOSCOPY W/ BIOPSIES  fall 2009   COMBINED HYSTERECTOMY VAGINAL / OOPHORECTOMY / A&P REPAIR  10/2002   endometriosis, cystocele, fibroids   FINGER SURGERY Left 05/09/2020   spurs removed from left index finger   KNEE SURGERY  1996   TOE SURGERY     VESICOVAGINAL FISTULA CLOSURE W/ TAH     Social History   Social History Narrative   Lives with husband   Retired    no children   Assoc degree   16 oz caffeine daily   Immunization History  Administered Date(s) Administered   Influenza Whole 10/07/2010, 11/06/2011   Influenza,inj,Quad PF,6+ Mos 11/05/2012, 11/02/2015, 11/22/2016, 11/15/2017   Influenza-Unspecified 11/05/2013   Moderna Sars-Covid-2 Vaccination 04/13/2019, 05/11/2019    Pneumococcal Conjugate-13 11/06/2014   Pneumococcal Polysaccharide-23 11/02/2015     Objective: Vital Signs: There were no vitals taken for this visit.   Physical Exam   Musculoskeletal Exam: ***  CDAI Exam: CDAI Score: -- Patient Global: --; Provider Global: -- Swollen: --; Tender: -- Joint Exam 10/31/2022   No joint exam has been documented for this visit   There is currently no information documented on the homunculus. Go to the Rheumatology activity and complete the homunculus joint exam.  Investigation: No additional findings.  Imaging: No results found.  Recent Labs: Lab Results  Component Value Date   WBC 11.4 (H) 08/23/2022   HGB 13.6 08/23/2022   PLT 375 08/23/2022   NA 142 08/23/2022   K 4.4 08/23/2022   CL 109 08/23/2022   CO2 18 (L) 08/23/2022   GLUCOSE 179 (H) 08/23/2022   BUN 23 08/23/2022   CREATININE 0.81 08/23/2022   BILITOT 0.3 08/23/2022   ALKPHOS 54 09/10/2016   AST 16 08/23/2022   ALT 22 08/23/2022   PROT 6.9 08/23/2022   ALBUMIN 4.0 09/10/2016   CALCIUM 10.2 08/23/2022   GFRAA 96 07/26/2020    Speciality Comments: No specialty comments available.  Procedures:  No procedures performed Allergies: Nsaids, Bactrim [sulfamethoxazole-trimethoprim], Cephalexin, and Levofloxacin   Assessment / Plan:     Visit Diagnoses: No diagnosis found.  Orders: No orders of the defined types were placed in this encounter.  No orders of the defined types were placed in this encounter.   Face-to-face time spent with patient was *** minutes. Greater than 50% of time was spent in counseling and coordination of care.  Follow-Up Instructions: No follow-ups on file.   Ellen Henri, CMA  Note - This record has been created using Animal nutritionist.  Chart creation errors have been sought, but may not always  have been located. Such creation errors do not reflect on  the standard of medical care.

## 2022-10-23 DIAGNOSIS — Z23 Encounter for immunization: Secondary | ICD-10-CM | POA: Diagnosis not present

## 2022-10-29 NOTE — Progress Notes (Signed)
Insert order was never placed I called patient and left her a message letting her know and to please call with any questions and that inserts are now on order   Stephanie Norris Cped, CFo, CFm

## 2022-10-31 ENCOUNTER — Ambulatory Visit: Payer: PPO | Admitting: Rheumatology

## 2022-10-31 ENCOUNTER — Telehealth (HOSPITAL_COMMUNITY): Payer: Self-pay | Admitting: *Deleted

## 2022-10-31 DIAGNOSIS — Z1589 Genetic susceptibility to other disease: Secondary | ICD-10-CM

## 2022-10-31 DIAGNOSIS — G8929 Other chronic pain: Secondary | ICD-10-CM

## 2022-10-31 DIAGNOSIS — Z8669 Personal history of other diseases of the nervous system and sense organs: Secondary | ICD-10-CM

## 2022-10-31 DIAGNOSIS — Z8659 Personal history of other mental and behavioral disorders: Secondary | ICD-10-CM

## 2022-10-31 DIAGNOSIS — M0579 Rheumatoid arthritis with rheumatoid factor of multiple sites without organ or systems involvement: Secondary | ICD-10-CM

## 2022-10-31 DIAGNOSIS — Z8709 Personal history of other diseases of the respiratory system: Secondary | ICD-10-CM

## 2022-10-31 DIAGNOSIS — M62838 Other muscle spasm: Secondary | ICD-10-CM

## 2022-10-31 DIAGNOSIS — M7062 Trochanteric bursitis, left hip: Secondary | ICD-10-CM

## 2022-10-31 DIAGNOSIS — M5030 Other cervical disc degeneration, unspecified cervical region: Secondary | ICD-10-CM

## 2022-10-31 DIAGNOSIS — M19041 Primary osteoarthritis, right hand: Secondary | ICD-10-CM

## 2022-10-31 DIAGNOSIS — M797 Fibromyalgia: Secondary | ICD-10-CM

## 2022-10-31 DIAGNOSIS — M8589 Other specified disorders of bone density and structure, multiple sites: Secondary | ICD-10-CM

## 2022-10-31 DIAGNOSIS — Z8639 Personal history of other endocrine, nutritional and metabolic disease: Secondary | ICD-10-CM

## 2022-10-31 DIAGNOSIS — R5383 Other fatigue: Secondary | ICD-10-CM

## 2022-10-31 DIAGNOSIS — M19071 Primary osteoarthritis, right ankle and foot: Secondary | ICD-10-CM

## 2022-10-31 DIAGNOSIS — M5136 Other intervertebral disc degeneration, lumbar region: Secondary | ICD-10-CM

## 2022-10-31 DIAGNOSIS — Z79899 Other long term (current) drug therapy: Secondary | ICD-10-CM

## 2022-10-31 NOTE — Telephone Encounter (Signed)
Patients wife called stated that they saw Dr hall Today & they shared with him the Prozac recently prescribed isn't working.  Dr hall suggested they get in touch with Dr Tenny Craw to see what she recommends.  I'm reaching to Admin Staff to schedule a f/u appt

## 2022-10-31 NOTE — Telephone Encounter (Signed)
Message put under wife's name by mistake was suppose to go under husband's name

## 2022-11-07 ENCOUNTER — Encounter: Payer: Self-pay | Admitting: Allergy & Immunology

## 2022-11-07 ENCOUNTER — Other Ambulatory Visit: Payer: Self-pay

## 2022-11-07 ENCOUNTER — Ambulatory Visit: Payer: PPO | Admitting: Allergy & Immunology

## 2022-11-07 VITALS — BP 124/76 | HR 84 | Temp 97.6°F | Resp 14 | Ht 59.65 in | Wt 115.4 lb

## 2022-11-07 DIAGNOSIS — J310 Chronic rhinitis: Secondary | ICD-10-CM

## 2022-11-07 DIAGNOSIS — J4540 Moderate persistent asthma, uncomplicated: Secondary | ICD-10-CM | POA: Diagnosis not present

## 2022-11-07 DIAGNOSIS — K219 Gastro-esophageal reflux disease without esophagitis: Secondary | ICD-10-CM

## 2022-11-07 MED ORDER — CETIRIZINE HCL 10 MG PO TABS: 10.0000 mg | ORAL_TABLET | Freq: Every day | ORAL | 3 refills | Status: AC

## 2022-11-07 NOTE — Addendum Note (Signed)
Addended by: Elsworth Soho on: 11/07/2022 05:05 PM   Modules accepted: Orders

## 2022-11-07 NOTE — Patient Instructions (Addendum)
Asthma - Lung testing looks phenomenal. - We are not going to make any changes since you are doing so well. - Continue Breo 200-1 puff once a day to prevent cough and wheeze - Continue montelukast 10 mg once a day to prevent cough or wheeze - You may use albuterol 2 puffs once every 4 hours as needed for cough or wheeze - You may use albuterol 5 to 15 minutes before activity to decrease cough or wheeze  Seasonal and perennial rhinitis - Continue allergen avoidance directed toward indoor and outdoor molds - Continue cetirizine 10 mg once a day as needed for runny nose or itch - Continue azelastine 2 sprays in each nostril once a day as needed for a runny nose - Consider saline nasal rinses as needed for nasal symptoms. Use this before any medicated nasal sprays for best result - Consider saline gel for the left nostril or as needed for dry nostrils.  Nasal lesion - Recommend follow-up with your ENT specialist  Return in about 6 months (around 05/08/2023). You can have the follow up appointment with Dr. Dellis Anes or a Nurse Practicioner (our Nurse Practitioners are excellent and always have Physician oversight!).    Please inform us of any Emergency Department visits, hospitalizations, or changes in symptoms. Call us before going to the ED for breathing or allergy symptoms since we might be able to fit you in for a sick visit. Feel free to contact us anytime with any questions, problems, or concerns.  It was a pleasure to see you again today!  Websites that have reliable patient information: 1. American Academy of Asthma, Allergy, and Immunology: www.aaaai.org 2. Food Allergy Research and Education (FARE): foodallergy.org 3. Mothers of Asthmatics: http://www.asthmacommunitynetwork.org 4. American College of Allergy, Asthma, and Immunology: www.acaai.org   COVID-19 Vaccine Information can be found at: PodExchange.nl For  questions related to vaccine distribution or appointments, please email [email or web address] or call [phone].     "Like" [place] on Facebook and Instagram for our latest updates!      A healthy democracy works best when [employer] participate! Make sure you are registered to vote! If you have moved or changed any of your contact information, you will need to get this updated before voting! Scan the QR codes below to learn more!

## 2022-11-07 NOTE — Progress Notes (Signed)
FOLLOW UP  Date of Service/Encounter:  11/07/22   Assessment:   Moderate persistent asthma, uncomplicated - well controlled with the current regimen   Chronic rhinitis  GERD - on a PPI   Nasal sore  Plan/Recommendations:   Asthma - Lung testing looks phenomenal. - We are not going to make any changes since you are doing so well. - We did discuss stepping down therapy to the lower dose of Breo. - Continue Breo 200-1 puff once a day to prevent cough and wheeze - Continue montelukast 10 mg once a day to prevent cough or wheeze - You may use albuterol 2 puffs once every 4 hours as needed for cough or wheeze - You may use albuterol 5 to 15 minutes before activity to decrease cough or wheeze  Seasonal and perennial rhinitis - Continue allergen avoidance directed toward indoor and outdoor molds - Continue cetirizine 10 mg once a day as needed for runny nose or itch - Continue azelastine 2 sprays in each nostril once a day as needed for a runny nose - Consider saline nasal rinses as needed for nasal symptoms. Use this before any medicated nasal sprays for best result - Consider saline gel for the left nostril or as needed for dry nostrils.  Nasal lesion - Recommend follow-up with your ENT specialist  Subjective:   Stephanie Norris is a 67 y.o. female presenting today for follow up of  Chief Complaint  Patient presents with   Follow-up    Stephanie Norris has a history of the following: Patient Active Problem List   Diagnosis Date Noted   Chronic rhinitis 05/07/2022   Sore in nostril 05/07/2022   Mucous cyst of digit of left hand 04/07/2020   Vertigo 08/20/2016   Eustachian tube dysfunction 04/17/2016   Abdominal pain 03/02/2016   Gastroenteritis 03/02/2016   Rheumatoid arthritis with rheumatoid factor of multiple sites without organ or systems involvement (HCC) 01/13/2016   HLA B27 (HLA B27 positive) 01/13/2016   Osteoarthritis of foot 01/13/2016   DJD (degenerative  joint disease), cervical 01/13/2016   Spondylosis of lumbar region without myelopathy or radiculopathy 01/13/2016   Primary osteoarthritis of both hands 01/13/2016   Non-seasonal allergic rhinitis 08/13/2014   Stiffness of joints, not elsewhere classified, multiple sites 08/26/2013   Flu-like symptoms 03/02/2013   Lactic acidosis 03/02/2013   Sinus tachycardia 03/02/2013   Elevated lactic acid level 03/02/2013   Influenza due to identified novel influenza A virus with other respiratory manifestations 03/02/2013   Hyperglycemia 03/02/2013   Fibromyalgia    OSA (obstructive sleep apnea) 06/22/2011   DOE (dyspnea on exertion) 05/30/2011   Not well controlled moderate persistent asthma 03/30/2011    History obtained from: chart review and patient.  Discussed the use of AI scribe software for clinical note transcription with the patient, who gave verbal consent to proceed.  Stephanie Norris is a 67 y.o. female presenting for a follow up visit.  She was last seen in April 2024 by one of our nurse practitioners.  At that time, she was continued on Breo 200 mcg 1 puff daily as well as montelukast and albuterol.  For her rhinitis, she continued Xyzal as well as Astelin and Flonase.  Since last visit, she has done very well.  The patient, currently on Breo for respiratory issues, reports stability in their condition. They have not needed to use their albuterol inhaler for a significant period, indicating good control of their respiratory symptoms. They occasionally experience coughing at  night, approximately once or twice a week. The patient has not required prednisone for their breathing for several months.  In terms of their environmental allergies, the patient reports being symptom-free despite the presence of ragweed. They are currently on tyrosine and use a nasal spray daily. They have not needed to use Flonase for a long time and have not required antibiotics for sinus infections for several  months.  The patient also reports issues with reflux, which is well-controlled with pantoprazole. If they miss a dose, they notice a significant increase in symptoms.   She continues to have a lesion in the right side of her nose.  She saw ENT and they told her to go away in 2 weeks.  However, it is still there.  It catches a lot of the mucus that comes out of her nose, but it does not bleed any other.  Has seemed of gotten a little smaller.    The patient's husband recently broke his leg, which has caused some additional stress. Despite these challenges, the patient's overall health appears to be stable and well-managed with their current medication regimen.  She is hoping for a better 2025.  Otherwise, there have been no changes to her past medical history, surgical history, family history, or social history.    Review of systems otherwise negative other than that mentioned in the HPI.    Objective:   Blood pressure 124/76, pulse 84, temperature 97.6 F (36.4 C), resp. rate 14, height 4' 11.65" (1.515 m), weight 115 lb 6 oz (52.3 kg), SpO2 99%. Body mass index is 22.8 kg/m.    Physical Exam Vitals reviewed.  Constitutional:      Appearance: Normal appearance. She is well-developed.     Comments: Pleasant.  Cooperative with exam.  HENT:     Head: Normocephalic and atraumatic.     Right Ear: Tympanic membrane, ear canal and external ear normal. No drainage, swelling or tenderness. Tympanic membrane is not injected, scarred, erythematous, retracted or bulging.     Left Ear: Tympanic membrane, ear canal and external ear normal. No drainage, swelling or tenderness. Tympanic membrane is not injected, scarred, erythematous, retracted or bulging.     Nose: No nasal deformity, septal deviation, mucosal edema or rhinorrhea.     Right Turbinates: Enlarged and swollen.     Left Turbinates: Enlarged and swollen.     Right Sinus: No maxillary sinus tenderness or frontal sinus tenderness.      Left Sinus: No maxillary sinus tenderness or frontal sinus tenderness.     Comments: No polyps.  Minimal rhinorrhea.    Mouth/Throat:     Mouth: Mucous membranes are not pale and not dry.     Pharynx: Uvula midline.  Eyes:     General:        Right eye: No discharge.        Left eye: No discharge.     Conjunctiva/sclera: Conjunctivae normal.     Right eye: Right conjunctiva is not injected. No chemosis.    Left eye: Left conjunctiva is not injected. No chemosis.    Pupils: Pupils are equal, round, and reactive to light.  Cardiovascular:     Rate and Rhythm: Normal rate and regular rhythm.     Heart sounds: Normal heart sounds.  Pulmonary:     Effort: Pulmonary effort is normal. No tachypnea, accessory muscle usage or respiratory distress.     Breath sounds: Normal breath sounds. No wheezing, rhonchi or rales.  Chest:  Chest wall: No tenderness.  Abdominal:     Tenderness: There is no abdominal tenderness. There is no guarding or rebound.  Lymphadenopathy:     Head:     Right side of head: No submandibular, tonsillar or occipital adenopathy.     Left side of head: No submandibular, tonsillar or occipital adenopathy.     Cervical: No cervical adenopathy.  Skin:    Coloration: Skin is not pale.     Findings: No abrasion, erythema, petechiae or rash. Rash is not papular, urticarial or vesicular.  Neurological:     Mental Status: She is alert.  Psychiatric:        Behavior: Behavior is cooperative.      Diagnostic studies:    Spirometry: results normal (FEV1: 4.53/82%, FVC: 2.42/102%, FEV1/FVC: 63%).    Spirometry consistent with normal pattern.    Allergy Studies: none        Malachi Bonds, MD  [facility]

## 2022-11-12 ENCOUNTER — Telehealth: Payer: Self-pay | Admitting: Pharmacist

## 2022-11-12 ENCOUNTER — Other Ambulatory Visit: Payer: Self-pay | Admitting: Physician Assistant

## 2022-11-12 NOTE — Progress Notes (Signed)
11/12/2022  Patient ID: Stephanie Norris, female   DOB: 05-24-1955, 67 y.o.   MRN: [id number]  Called Patient regarding medication assistance with [place].  Unfortunately, she did not answer the phone. HIPAA compliant message was left on her voicemail.  Plan: Call patient back in 3-5 business days.  Beecher Mcardle, PharmD, BCACP [facility] Clinical Pharmacist [phone]

## 2022-11-13 NOTE — Telephone Encounter (Signed)
Last Fill: 08/18/2022  Labs: 08/23/2022 Glucose is elevated.White cell count is elevated and stable.   Next Visit: 12/05/2022  Last Visit: 05/15/2022  DX: Rheumatoid arthritis with rheumatoid factor of multiple sites without organ or systems involvement   Current Dose per office note 05/15/2022: Methotrexate 6 tablets by mouth every 7 days   Patient to update labs at upcoming appointment on 12/05/2022  Okay to refill Methotrexate?

## 2022-11-21 NOTE — Progress Notes (Signed)
Office Visit Note  Patient: Stephanie Norris             Date of Birth: 1955-07-09           MRN: [id number]             PCP: Benita Stabile, MD Referring: Benita Stabile, MD Visit Date: 12/05/2022 Occupation: @GUAROCC @  Subjective:  Medication management  History of Present Illness: Stephanie Norris is a 67 y.o. female with rheumatoid arthritis, osteoarthritis, degenerative disc disease, fibromyalgia and osteopenia.  She returns today after last visit in April 2024.  She denies having a flare of rheumatoid arthritis since her last visit.  She has been taking methotrexate 6 tablets p.o. q. weekend folic acid 2 mg daily without any interruption.  She continues to have some stiffness in her hands and feet but no swelling.  Left trochanteric bursitis currently asymptomatic.  She continues to have discomfort in her neck and lower back.  Complains of trapezius pain.  She had a recent flare of fibromyalgia with increased generalized pain.  She states she is Stephanie Norris a dose of Cymbalta 120 mg p.o. daily which has been helpful in relieving her discomfort.  Her fatigue is improved.    Activities of Daily Living:  Patient reports morning stiffness for 20-30 minutes.   Patient Reports nocturnal pain.  Difficulty dressing/grooming: Denies Difficulty climbing stairs: Reports Difficulty getting out of chair: Denies Difficulty using hands for taps, buttons, cutlery, and/or writing: Reports  Review of Systems  Constitutional:  Positive for fatigue.  HENT:  Positive for mouth dryness. Negative for mouth sores.   Eyes:  Negative for dryness.  Respiratory:  Negative for difficulty breathing.   Cardiovascular:  Negative for chest pain and palpitations.  Gastrointestinal:  Negative for blood in stool, constipation and diarrhea.  Endocrine: Negative for increased urination.  Genitourinary:  Negative for involuntary urination.  Musculoskeletal:  Positive for joint pain, joint pain, muscle weakness and morning  stiffness. Negative for gait problem, joint swelling, myalgias, muscle tenderness and myalgias.  Skin:  Negative for color change, rash, hair loss and sensitivity to sunlight.  Allergic/Immunologic: Negative for susceptible to infections.  Neurological:  Positive for headaches. Negative for dizziness.  Hematological:  Negative for swollen glands.  Psychiatric/Behavioral:  Positive for depressed mood and sleep disturbance. The patient is nervous/anxious.     PMFS History:  Patient Active Problem List   Diagnosis Date Noted   Chronic rhinitis 05/07/2022   Sore in nostril 05/07/2022   Mucous cyst of digit of left hand 04/07/2020   Vertigo 08/20/2016   Eustachian tube dysfunction 04/17/2016   Abdominal pain 03/02/2016   Gastroenteritis 03/02/2016   Rheumatoid arthritis with rheumatoid factor of multiple sites without organ or systems involvement (HCC) 01/13/2016   HLA B27 (HLA B27 positive) 01/13/2016   Osteoarthritis of foot 01/13/2016   DJD (degenerative joint disease), cervical 01/13/2016   Spondylosis of lumbar region without myelopathy or radiculopathy 01/13/2016   Primary osteoarthritis of both hands 01/13/2016   Non-seasonal allergic rhinitis 08/13/2014   Stiffness of joints, not elsewhere classified, multiple sites 08/26/2013   Flu-like symptoms 03/02/2013   Lactic acidosis 03/02/2013   Sinus tachycardia 03/02/2013   Elevated lactic acid level 03/02/2013   Influenza due to identified novel influenza A virus with other respiratory manifestations 03/02/2013   Hyperglycemia 03/02/2013   Fibromyalgia    OSA (obstructive sleep apnea) 06/22/2011   DOE (dyspnea on exertion) 05/30/2011   Not well controlled moderate persistent asthma  03/30/2011    Past Medical History:  Diagnosis Date   Asthma    COPD (chronic obstructive pulmonary disease) (HCC)    Diabetes mellitus without complication (HCC)    Type 2   Dizziness    Fibromyalgia    GERD (gastroesophageal reflux disease)     Headache    Hyperlipidemia    OSA (obstructive sleep apnea)    Osteoarthritis    PCOS (polycystic ovarian syndrome)    Raynaud disease    Rheumatoid arthritis (HCC)     Family History  Problem Relation Age of Onset   Asthma Mother    Rheum arthritis Mother    Allergic rhinitis Mother    Rheum arthritis Sister    Allergic rhinitis Sister    Heart failure Father    Heart disease Father    Rheum arthritis Sister    Allergic rhinitis Sister    Allergic rhinitis Brother    Allergies Other        "Everyone"   Heart disease Maternal Grandfather    Heart disease Maternal Grandmother    Heart disease Paternal Grandfather    Heart disease Paternal Grandmother    Past Surgical History:  Procedure Laterality Date   APPENDECTOMY     BACK SURGERY  2021   CHOLECYSTECTOMY     COLONOSCOPY W/ BIOPSIES  fall 2009   COMBINED HYSTERECTOMY VAGINAL / OOPHORECTOMY / A&P REPAIR  10/2002   endometriosis, cystocele, fibroids   FINGER SURGERY Left 05/09/2020   spurs removed from left index finger   KNEE SURGERY  1996   TOE SURGERY     VESICOVAGINAL FISTULA CLOSURE W/ TAH     Social History   Social History Narrative   Lives with husband   Retired    no children   Assoc degree   16 oz caffeine daily   Immunization History  Administered Date(s) Administered   Influenza Whole 10/07/2010, 11/06/2011   Influenza,inj,Quad PF,6+ Mos 11/05/2012, 11/02/2015, 11/22/2016, 11/15/2017   Influenza-Unspecified 11/05/2013   Moderna Sars-Covid-2 Vaccination 04/13/2019, 05/11/2019   Pneumococcal Conjugate-13 11/06/2014   Pneumococcal Polysaccharide-23 11/02/2015     Objective: Vital Signs: BP 119/74 (BP Location: Left Arm, Patient Position: Sitting, Cuff Size: Normal)   Pulse 84   Resp 15   Ht 5' (1.524 m)   Wt 114 lb (51.7 kg)   BMI 22.26 kg/m    Physical Exam Vitals and nursing note reviewed.  Constitutional:      Appearance: She is well-developed.  HENT:     Head: Normocephalic and  atraumatic.  Eyes:     Conjunctiva/sclera: Conjunctivae normal.  Cardiovascular:     Rate and Rhythm: Normal rate and regular rhythm.     Heart sounds: Normal heart sounds.  Pulmonary:     Effort: Pulmonary effort is normal.     Breath sounds: Normal breath sounds.  Abdominal:     General: Bowel sounds are normal.     Palpations: Abdomen is soft.  Musculoskeletal:     Cervical back: Normal range of motion.  Lymphadenopathy:     Cervical: No cervical adenopathy.  Skin:    General: Skin is warm and dry.     Capillary Refill: Capillary refill takes less than 2 seconds.  Neurological:     Mental Status: She is alert and oriented to person, place, and time.  Psychiatric:        Behavior: Behavior normal.      Musculoskeletal Exam: She had limited lateral rotation of the cervical spine without  any discomfort.  Bilateral trapezius spasm was noted.  Thoracic kyphosis was noted.  She had no point tenderness over thoracic or lumbar spine.  Shoulders, elbows, wrist joints, MCPs PIPs and DIPs were in good range of motion.  She had right second and third MCP thickening with no synovitis.  She had bilateral PIP and DIP thickening.  Hip joints and knee joints were in good range of motion without any warmth swelling or effusion.  There was no tenderness over ankles or MTPs.  CDAI Exam: CDAI Score: -- Patient Global: --; Provider Global: -- Swollen: --; Tender: -- Joint Exam 12/05/2022   No joint exam has been documented for this visit   There is currently no information documented on the homunculus. Go to the Rheumatology activity and complete the homunculus joint exam.  Investigation: No additional findings.  Imaging: No results found.  Recent Labs: Lab Results  Component Value Date   WBC 11.4 (H) 08/23/2022   HGB 13.6 08/23/2022   PLT 375 08/23/2022   NA 142 08/23/2022   K 4.4 08/23/2022   CL 109 08/23/2022   CO2 18 (L) 08/23/2022   GLUCOSE 179 (H) 08/23/2022   BUN 23  08/23/2022   CREATININE 0.81 08/23/2022   BILITOT 0.3 08/23/2022   ALKPHOS 54 09/10/2016   AST 16 08/23/2022   ALT 22 08/23/2022   PROT 6.9 08/23/2022   ALBUMIN 4.0 09/10/2016   CALCIUM 10.2 08/23/2022   GFRAA 96 07/26/2020    Speciality Comments: No specialty comments available.  Procedures:  No procedures performed Allergies: Nsaids, Bactrim [sulfamethoxazole-trimethoprim], Cephalexin, and Levofloxacin   Assessment / Plan:     Visit Diagnoses: Rheumatoid arthritis with rheumatoid factor of multiple sites without organ or systems involvement (HCC) - +CCP: Patient denies any increased joint swelling.  No synovitis was noted on the examination.  She has been taking methotrexate 6 tablets weekly along with folic acid which she been tolerating well.  High risk medication use - Methotrexate 6 tablets by mouth every 7 days and folic acid 1 mg 2 tablets daily.  Labs obtained in July were within normal limits.  She was advised to get labs today and then every 3 months to monitor for drug toxicity.  Information on immunization was placed in the AVS.  She was advised to hold methotrexate if she develops an infection and resume after the infection resolves.  HLA B27 (HLA B27 positive)  Primary osteoarthritis of both hands-she has rheumatoid arthritis and osteoarthritis overlap with MCP thickening and PIP and DIP prominence.  Joint protection muscle strengthening was discussed.  Trochanteric bursitis of left hip-she has intermittent discomfort.  She had no tenderness on the palpation today.  Primary osteoarthritis of both feet-she has stiffness in her feet off and on.  No synovitis was noted.  Chronic SI joint pain - Left SI joint injected on 11/22/21.  She denies any discomfort today.  DDD (degenerative disc disease), cervical-she had limited range of motion with some discomfort.  Spondylosis of lumbar spine -she is to have intermittent discomfort in her lower back.  She had no point  tenderness on the examination today.  Discectomy performed April 2021 by Dr. Lovell Sheehan.  Osteopenia of multiple sites - 12/24/16 DEXA scan showed T score of -2.3, BMD 0.596 in the left femoral neck.  Use of calcium rich diet and vitamin D was advised.  Patient was advised to get repeat DEXA scan.  Fibromyalgia -she reports intermittent flares of fibromyalgia.  Patient states her symptoms have improved  since she has been taking Cymbalta 120 mg daily.  She had mild trapezius spasm and some hyperalgesia.  Trapezius muscle spasm-patient states that the pain is manageable currently.  Other fatigue-she notices improvement in fatigue.  History of diabetes mellitus  History of depression  History of sleep apnea  History of asthma  Orders: Orders Placed This Encounter  Procedures   CBC with Differential/Platelet   COMPLETE METABOLIC PANEL WITH GFR   No orders of the defined types were placed in this encounter.    Follow-Up Instructions: Return in about 5 months (around 05/05/2023) for Rheumatoid arthritis.   Pollyann Savoy, MD  Note - This record has been created using Animal nutritionist.  Chart creation errors have been sought, but may not always  have been located. Such creation errors do not reflect on  the standard of medical care.

## 2022-11-23 DIAGNOSIS — R350 Frequency of micturition: Secondary | ICD-10-CM | POA: Diagnosis not present

## 2022-12-05 ENCOUNTER — Encounter: Payer: Self-pay | Admitting: Rheumatology

## 2022-12-05 ENCOUNTER — Ambulatory Visit: Payer: PPO | Attending: Rheumatology | Admitting: Rheumatology

## 2022-12-05 VITALS — BP 119/74 | HR 84 | Resp 15 | Ht 60.0 in | Wt 114.0 lb

## 2022-12-05 DIAGNOSIS — Z8669 Personal history of other diseases of the nervous system and sense organs: Secondary | ICD-10-CM

## 2022-12-05 DIAGNOSIS — M8589 Other specified disorders of bone density and structure, multiple sites: Secondary | ICD-10-CM

## 2022-12-05 DIAGNOSIS — M62838 Other muscle spasm: Secondary | ICD-10-CM

## 2022-12-05 DIAGNOSIS — Z8639 Personal history of other endocrine, nutritional and metabolic disease: Secondary | ICD-10-CM

## 2022-12-05 DIAGNOSIS — M7062 Trochanteric bursitis, left hip: Secondary | ICD-10-CM

## 2022-12-05 DIAGNOSIS — M0579 Rheumatoid arthritis with rheumatoid factor of multiple sites without organ or systems involvement: Secondary | ICD-10-CM

## 2022-12-05 DIAGNOSIS — M5030 Other cervical disc degeneration, unspecified cervical region: Secondary | ICD-10-CM | POA: Diagnosis not present

## 2022-12-05 DIAGNOSIS — M19041 Primary osteoarthritis, right hand: Secondary | ICD-10-CM

## 2022-12-05 DIAGNOSIS — R5383 Other fatigue: Secondary | ICD-10-CM

## 2022-12-05 DIAGNOSIS — M19072 Primary osteoarthritis, left ankle and foot: Secondary | ICD-10-CM

## 2022-12-05 DIAGNOSIS — Z1589 Genetic susceptibility to other disease: Secondary | ICD-10-CM | POA: Diagnosis not present

## 2022-12-05 DIAGNOSIS — M19042 Primary osteoarthritis, left hand: Secondary | ICD-10-CM

## 2022-12-05 DIAGNOSIS — Z8709 Personal history of other diseases of the respiratory system: Secondary | ICD-10-CM

## 2022-12-05 DIAGNOSIS — M797 Fibromyalgia: Secondary | ICD-10-CM | POA: Diagnosis not present

## 2022-12-05 DIAGNOSIS — M533 Sacrococcygeal disorders, not elsewhere classified: Secondary | ICD-10-CM

## 2022-12-05 DIAGNOSIS — Z79899 Other long term (current) drug therapy: Secondary | ICD-10-CM | POA: Diagnosis not present

## 2022-12-05 DIAGNOSIS — M19071 Primary osteoarthritis, right ankle and foot: Secondary | ICD-10-CM | POA: Diagnosis not present

## 2022-12-05 DIAGNOSIS — G8929 Other chronic pain: Secondary | ICD-10-CM

## 2022-12-05 DIAGNOSIS — M47816 Spondylosis without myelopathy or radiculopathy, lumbar region: Secondary | ICD-10-CM | POA: Diagnosis not present

## 2022-12-05 DIAGNOSIS — Z8659 Personal history of other mental and behavioral disorders: Secondary | ICD-10-CM

## 2022-12-05 NOTE — Patient Instructions (Addendum)
Please call [facility] to schedule bone density scan [phone]  Standing Labs We placed an order today for your standing lab work.   Please have your standing labs drawn in January and every 3 months  Please have your labs drawn 2 weeks prior to your appointment so that the provider can discuss your lab results at your appointment, if possible.  Please note that you may see your imaging and lab results in MyChart before we have reviewed them. We will contact you once all results are reviewed. Please allow our office up to 72 hours to thoroughly review all of the results before contacting the office for clarification of your results.  WALK-IN LAB HOURS  Monday through Thursday from 8:00 am -12:30 pm and 1:00 pm-5:00 pm and Friday from 8:00 am-12:00 pm.  Patients with office visits requiring labs will be seen before walk-in labs.  You may encounter longer than normal wait times. Please allow additional time. Wait times may be shorter on  Monday and Thursday afternoons.  We do not book appointments for walk-in labs. We appreciate your patience and understanding with our staff.   Labs are drawn by [facility]. Please bring your co-pay at the time of your lab draw.  You may receive a bill from [facility] for your lab work.  Please note if you are on Hydroxychloroquine and and an order has been placed for a Hydroxychloroquine level,  you will need to have it drawn 4 hours or more after your last dose.  If you wish to have your labs drawn at another location, please call the office 24 hours in advance so we can fax the orders.  The office is located at [address], [place], [place] [address]   If you have any questions regarding directions or hours of operation,  please call [phone].   As a reminder, please drink plenty of water prior to coming for your lab work. Thanks!   Vaccines You are taking a medication(s) that can suppress your immune system.  The following immunizations are  recommended: Flu annually Covid-19  RSV Td/Tdap (tetanus, diphtheria, pertussis) every 10 years Pneumonia (Prevnar 15 then Pneumovax 23 at least 1 year apart.  Alternatively, can take Prevnar 20 without needing additional dose) Shingrix: 2 doses from 4 weeks to 6 months apart  Please check with your PCP to make sure you are up to date.   If you have signs or symptoms of an infection or start antibiotics: First, call your PCP for workup of your infection. Hold your medication through the infection, until you complete your antibiotics, and until symptoms resolve if you take the following: Injectable medication (Actemra, Benlysta, Cimzia, Cosentyx, Enbrel, Humira, Kevzara, Orencia, Remicade, Simponi, Stelara, Taltz, Tremfya) Methotrexate Leflunomide (Arava) Mycophenolate (Cellcept) Harriette Ohara, Olumiant, or Rinvoq

## 2022-12-06 LAB — CBC WITH DIFFERENTIAL/PLATELET
Absolute Lymphocytes: 2967 {cells}/uL (ref 850–3900)
Absolute Monocytes: 632 {cells}/uL (ref 200–950)
Basophils Absolute: 56 {cells}/uL (ref 0–200)
Basophils Relative: 0.6 %
Eosinophils Absolute: 335 {cells}/uL (ref 15–500)
Eosinophils Relative: 3.6 %
HCT: 41.8 % (ref 35.0–45.0)
Hemoglobin: 13.7 g/dL (ref 11.7–15.5)
MCH: 31.2 pg (ref 27.0–33.0)
MCHC: 32.8 g/dL (ref 32.0–36.0)
MCV: 95.2 fL (ref 80.0–100.0)
MPV: 11 fL (ref 7.5–12.5)
Monocytes Relative: 6.8 %
Neutro Abs: 5310 {cells}/uL (ref 1500–7800)
Neutrophils Relative %: 57.1 %
Platelets: 385 10*3/uL (ref 140–400)
RBC: 4.39 10*6/uL (ref 3.80–5.10)
RDW: 14.1 % (ref 11.0–15.0)
Total Lymphocyte: 31.9 %
WBC: 9.3 10*3/uL (ref 3.8–10.8)

## 2022-12-06 LAB — COMPLETE METABOLIC PANEL WITH GFR
AG Ratio: 1.8 (calc) (ref 1.0–2.5)
ALT: 19 U/L (ref 6–29)
AST: 16 U/L (ref 10–35)
Albumin: 4.4 g/dL (ref 3.6–5.1)
Alkaline phosphatase (APISO): 70 U/L (ref 37–153)
BUN/Creatinine Ratio: 41 (calc) — ABNORMAL HIGH (ref 6–22)
BUN: 34 mg/dL — ABNORMAL HIGH (ref 7–25)
CO2: 20 mmol/L (ref 20–32)
Calcium: 9.9 mg/dL (ref 8.6–10.4)
Chloride: 102 mmol/L (ref 98–110)
Creat: 0.82 mg/dL (ref 0.50–1.05)
Globulin: 2.4 g/dL (ref 1.9–3.7)
Glucose, Bld: 196 mg/dL — ABNORMAL HIGH (ref 65–99)
Potassium: 4.3 mmol/L (ref 3.5–5.3)
Sodium: 137 mmol/L (ref 135–146)
Total Bilirubin: 0.3 mg/dL (ref 0.2–1.2)
Total Protein: 6.8 g/dL (ref 6.1–8.1)
eGFR: 77 mL/min/{1.73_m2} (ref >=60)

## 2022-12-06 NOTE — Progress Notes (Signed)
CBC is normal.

## 2022-12-06 NOTE — Progress Notes (Signed)
Glucose is elevated.  Please notify patient and forward results to her PCP.

## 2022-12-14 DIAGNOSIS — N3946 Mixed incontinence: Secondary | ICD-10-CM | POA: Diagnosis not present

## 2022-12-20 ENCOUNTER — Other Ambulatory Visit: Payer: Self-pay | Admitting: Rheumatology

## 2022-12-21 NOTE — Telephone Encounter (Signed)
Last Fill: 11/13/2022 (30 day supply)  Labs: 12/05/2022 Glucose is elevated.   CBC is normal.   Next Visit: 05/06/2023  Last Visit: 12/05/2022  DX: Rheumatoid arthritis with rheumatoid factor of multiple sites without organ or systems involvement   Current Dose per office note 12/05/2022: Methotrexate 6 tablets by mouth every 7 days   Okay to refill Methotrexate?

## 2022-12-23 ENCOUNTER — Other Ambulatory Visit: Payer: Self-pay | Admitting: Family Medicine

## 2022-12-26 ENCOUNTER — Ambulatory Visit (INDEPENDENT_AMBULATORY_CARE_PROVIDER_SITE_OTHER): Payer: PPO

## 2022-12-26 ENCOUNTER — Other Ambulatory Visit: Payer: PPO

## 2022-12-26 DIAGNOSIS — Q6671 Congenital pes cavus, right foot: Secondary | ICD-10-CM | POA: Diagnosis not present

## 2022-12-26 DIAGNOSIS — Q6672 Congenital pes cavus, left foot: Secondary | ICD-10-CM

## 2022-12-26 DIAGNOSIS — Q667 Congenital pes cavus, unspecified foot: Secondary | ICD-10-CM

## 2022-12-26 NOTE — Progress Notes (Addendum)
Patient presents today to pick up custom molded foot orthotics, diagnosed with Pes Cavus deformity  by Dr. Allena Katz .   Orthotics were dispensed and fit was satisfactory. MLA's properly supported helping to balance body weight across BIL feet evenly reducing plantar pressure and pain patient and myself reviewed instructions for break-in and wear. Written instructions given to patient. Auth sent to [facility] for today's DOS   Patient will follow up as needed.  Stephanie Norris CPed, CFo, CFm   11/26 auth received [id number] valid  12/26/22 thru 03/25/2022

## 2022-12-28 DIAGNOSIS — B001 Herpesviral vesicular dermatitis: Secondary | ICD-10-CM | POA: Diagnosis not present

## 2022-12-28 DIAGNOSIS — Z79899 Other long term (current) drug therapy: Secondary | ICD-10-CM | POA: Diagnosis not present

## 2022-12-28 DIAGNOSIS — R590 Localized enlarged lymph nodes: Secondary | ICD-10-CM | POA: Diagnosis not present

## 2023-01-09 DIAGNOSIS — R590 Localized enlarged lymph nodes: Secondary | ICD-10-CM | POA: Diagnosis not present

## 2023-01-09 DIAGNOSIS — H6593 Unspecified nonsuppurative otitis media, bilateral: Secondary | ICD-10-CM | POA: Diagnosis not present

## 2023-01-09 DIAGNOSIS — H6503 Acute serous otitis media, bilateral: Secondary | ICD-10-CM | POA: Diagnosis not present

## 2023-01-17 DIAGNOSIS — E039 Hypothyroidism, unspecified: Secondary | ICD-10-CM | POA: Diagnosis not present

## 2023-01-17 DIAGNOSIS — E1142 Type 2 diabetes mellitus with diabetic polyneuropathy: Secondary | ICD-10-CM | POA: Diagnosis not present

## 2023-01-17 DIAGNOSIS — E782 Mixed hyperlipidemia: Secondary | ICD-10-CM | POA: Diagnosis not present

## 2023-01-22 DIAGNOSIS — F331 Major depressive disorder, recurrent, moderate: Secondary | ICD-10-CM | POA: Diagnosis not present

## 2023-01-22 DIAGNOSIS — J4520 Mild intermittent asthma, uncomplicated: Secondary | ICD-10-CM | POA: Diagnosis not present

## 2023-01-22 DIAGNOSIS — M0579 Rheumatoid arthritis with rheumatoid factor of multiple sites without organ or systems involvement: Secondary | ICD-10-CM | POA: Diagnosis not present

## 2023-01-22 DIAGNOSIS — E1140 Type 2 diabetes mellitus with diabetic neuropathy, unspecified: Secondary | ICD-10-CM | POA: Diagnosis not present

## 2023-01-22 DIAGNOSIS — J302 Other seasonal allergic rhinitis: Secondary | ICD-10-CM | POA: Diagnosis not present

## 2023-01-22 DIAGNOSIS — E782 Mixed hyperlipidemia: Secondary | ICD-10-CM | POA: Diagnosis not present

## 2023-01-22 DIAGNOSIS — G9009 Other idiopathic peripheral autonomic neuropathy: Secondary | ICD-10-CM | POA: Diagnosis not present

## 2023-01-22 DIAGNOSIS — I10 Essential (primary) hypertension: Secondary | ICD-10-CM | POA: Diagnosis not present

## 2023-01-22 DIAGNOSIS — K219 Gastro-esophageal reflux disease without esophagitis: Secondary | ICD-10-CM | POA: Diagnosis not present

## 2023-01-22 DIAGNOSIS — G4733 Obstructive sleep apnea (adult) (pediatric): Secondary | ICD-10-CM | POA: Diagnosis not present

## 2023-01-22 DIAGNOSIS — M545 Low back pain, unspecified: Secondary | ICD-10-CM | POA: Diagnosis not present

## 2023-01-22 DIAGNOSIS — K2970 Gastritis, unspecified, without bleeding: Secondary | ICD-10-CM | POA: Diagnosis not present

## 2023-01-23 DIAGNOSIS — L7211 Pilar cyst: Secondary | ICD-10-CM | POA: Diagnosis not present

## 2023-01-31 ENCOUNTER — Other Ambulatory Visit: Payer: Self-pay | Admitting: Family Medicine

## 2023-02-05 DIAGNOSIS — J069 Acute upper respiratory infection, unspecified: Secondary | ICD-10-CM | POA: Diagnosis not present

## 2023-02-10 DIAGNOSIS — J019 Acute sinusitis, unspecified: Secondary | ICD-10-CM | POA: Diagnosis not present

## 2023-02-10 DIAGNOSIS — H6691 Otitis media, unspecified, right ear: Secondary | ICD-10-CM | POA: Diagnosis not present

## 2023-03-11 DIAGNOSIS — L7211 Pilar cyst: Secondary | ICD-10-CM | POA: Diagnosis not present

## 2023-04-23 NOTE — Progress Notes (Deleted)
 Office Visit Note  Patient: Stephanie Norris             Date of Birth: [birth year]           MRN: [id number]             PCP: Benita Stabile, MD Referring: Benita Stabile, MD Visit Date: 05/06/2023 Occupation: @GUAROCC @  Subjective:    History of Present Illness: Stephanie Norris is a 68 y.o. female with history of seropositive rheumatoid arthritis and osteoarthritis.  Patient remains on Methotrexate 6 tablets by mouth every 7 days and folic acid 1 mg 2 tablets daily.   CBC and CMP updated on 12/05/22.  Orders for CBC and CMP released today.  Discussed the importance of holding methotrexate if she develops signs or symptoms of an infection and to resume once the infection has completely cleared.     Activities of Daily Living:  Patient reports morning stiffness for *** {minute/hour:19697}.   Patient {ACTIONS;DENIES/REPORTS:21021675::"Denies"} nocturnal pain.  Difficulty dressing/grooming: {ACTIONS;DENIES/REPORTS:21021675::"Denies"} Difficulty climbing stairs: {ACTIONS;DENIES/REPORTS:21021675::"Denies"} Difficulty getting out of chair: {ACTIONS;DENIES/REPORTS:21021675::"Denies"} Difficulty using hands for taps, buttons, cutlery, and/or writing: {ACTIONS;DENIES/REPORTS:21021675::"Denies"}  No Rheumatology ROS completed.   PMFS History:  Patient Active Problem List   Diagnosis Date Noted   Chronic rhinitis 05/07/2022   Sore in nostril 05/07/2022   Mucous cyst of digit of left hand 04/07/2020   Vertigo 08/20/2016   Eustachian tube dysfunction 04/17/2016   Abdominal pain 03/02/2016   Gastroenteritis 03/02/2016   Rheumatoid arthritis with rheumatoid factor of multiple sites without organ or systems involvement (HCC) 01/13/2016   HLA B27 (HLA B27 positive) 01/13/2016   Osteoarthritis of foot 01/13/2016   DJD (degenerative joint disease), cervical 01/13/2016   Spondylosis of lumbar region without myelopathy or radiculopathy 01/13/2016   Primary osteoarthritis of both hands 01/13/2016    Non-seasonal allergic rhinitis 08/13/2014   Stiffness of joints, not elsewhere classified, multiple sites 08/26/2013   Flu-like symptoms 03/02/2013   Lactic acidosis 03/02/2013   Sinus tachycardia 03/02/2013   Elevated lactic acid level 03/02/2013   Influenza due to identified novel influenza A virus with other respiratory manifestations 03/02/2013   Hyperglycemia 03/02/2013   Fibromyalgia    OSA (obstructive sleep apnea) 06/22/2011   DOE (dyspnea on exertion) 05/30/2011   Not well controlled moderate persistent asthma 03/30/2011    Past Medical History:  Diagnosis Date   Asthma    COPD (chronic obstructive pulmonary disease) (HCC)    Diabetes mellitus without complication (HCC)    Type 2   Dizziness    Fibromyalgia    GERD (gastroesophageal reflux disease)    Headache    Hyperlipidemia    OSA (obstructive sleep apnea)    Osteoarthritis    PCOS (polycystic ovarian syndrome)    Raynaud disease    Rheumatoid arthritis (HCC)     Family History  Problem Relation Age of Onset   Asthma Mother    Rheum arthritis Mother    Allergic rhinitis Mother    Rheum arthritis Sister    Allergic rhinitis Sister    Heart failure Father    Heart disease Father    Rheum arthritis Sister    Allergic rhinitis Sister    Allergic rhinitis Brother    Allergies Other        "Everyone"   Heart disease Maternal Grandfather    Heart disease Maternal Grandmother    Heart disease Paternal Grandfather    Heart disease Paternal Grandmother    Past Surgical History:  Procedure Laterality Date   APPENDECTOMY     BACK SURGERY  2021   CHOLECYSTECTOMY     COLONOSCOPY W/ BIOPSIES  fall 2009   COMBINED HYSTERECTOMY VAGINAL / OOPHORECTOMY / A&P REPAIR  10/2002   endometriosis, cystocele, fibroids   FINGER SURGERY Left 05/09/2020   spurs removed from left index finger   KNEE SURGERY  1996   TOE SURGERY     VESICOVAGINAL FISTULA CLOSURE W/ TAH     Social History   Social History Narrative    Lives with husband   Retired    no children   Assoc degree   16 oz caffeine daily   Immunization History  Administered Date(s) Administered   Influenza Whole 10/07/2010, 11/06/2011   Influenza,inj,Quad PF,6+ Mos 11/05/2012, 11/02/2015, 11/22/2016, 11/15/2017   Influenza-Unspecified 11/05/2013   Moderna Sars-Covid-2 Vaccination 04/13/2019, 05/11/2019   Pneumococcal Conjugate-13 11/06/2014   Pneumococcal Polysaccharide-23 11/02/2015     Objective: Vital Signs: There were no vitals taken for this visit.   Physical Exam Vitals and nursing note reviewed.  Constitutional:      Appearance: She is well-developed.  HENT:     Head: Normocephalic and atraumatic.  Eyes:     Conjunctiva/sclera: Conjunctivae normal.  Cardiovascular:     Rate and Rhythm: Normal rate and regular rhythm.     Heart sounds: Normal heart sounds.  Pulmonary:     Effort: Pulmonary effort is normal.     Breath sounds: Normal breath sounds.  Abdominal:     General: Bowel sounds are normal.     Palpations: Abdomen is soft.  Musculoskeletal:     Cervical back: Normal range of motion.  Lymphadenopathy:     Cervical: No cervical adenopathy.  Skin:    General: Skin is warm and dry.     Capillary Refill: Capillary refill takes less than 2 seconds.  Neurological:     Mental Status: She is alert and oriented to person, place, and time.  Psychiatric:        Behavior: Behavior normal.      Musculoskeletal Exam: ***  CDAI Exam: CDAI Score: -- Patient Global: --; Provider Global: -- Swollen: --; Tender: -- Joint Exam 05/06/2023   No joint exam has been documented for this visit   There is currently no information documented on the homunculus. Go to the Rheumatology activity and complete the homunculus joint exam.  Investigation: No additional findings.  Imaging: No results found.  Recent Labs: Lab Results  Component Value Date   WBC 9.3 12/05/2022   HGB 13.7 12/05/2022   PLT 385 12/05/2022   NA  137 12/05/2022   K 4.3 12/05/2022   CL 102 12/05/2022   CO2 20 12/05/2022   GLUCOSE 196 (H) 12/05/2022   BUN 34 (H) 12/05/2022   CREATININE 0.82 12/05/2022   BILITOT 0.3 12/05/2022   ALKPHOS 54 09/10/2016   AST 16 12/05/2022   ALT 19 12/05/2022   PROT 6.8 12/05/2022   ALBUMIN 4.0 09/10/2016   CALCIUM 9.9 12/05/2022   GFRAA 96 07/26/2020    Speciality Comments: No specialty comments available.  Procedures:  No procedures performed Allergies: Nsaids, Bactrim [sulfamethoxazole-trimethoprim], Cephalexin, and Levofloxacin   Assessment / Plan:     Visit Diagnoses: Rheumatoid arthritis with rheumatoid factor of multiple sites without organ or systems involvement (HCC)  High risk medication use  HLA B27 (HLA B27 positive)  Primary osteoarthritis of both hands  Trochanteric bursitis of left hip  Primary osteoarthritis of both feet  Chronic SI joint  pain  DDD (degenerative disc disease), cervical  Spondylosis of lumbar spine  Osteopenia of multiple sites  Fibromyalgia  Trapezius muscle spasm  Other fatigue  History of diabetes mellitus  History of depression  History of sleep apnea  History of asthma  Orders: No orders of the defined types were placed in this encounter.  No orders of the defined types were placed in this encounter.   Face-to-face time spent with patient was *** minutes. Greater than 50% of time was spent in counseling and coordination of care.  Follow-Up Instructions: No follow-ups on file.   Gearldine Bienenstock, PA-C  Note - This record has been created using Dragon software.  Chart creation errors have been sought, but may not always  have been located. Such creation errors do not reflect on  the standard of medical care.

## 2023-05-01 DIAGNOSIS — E782 Mixed hyperlipidemia: Secondary | ICD-10-CM | POA: Diagnosis not present

## 2023-05-01 DIAGNOSIS — E1140 Type 2 diabetes mellitus with diabetic neuropathy, unspecified: Secondary | ICD-10-CM | POA: Diagnosis not present

## 2023-05-01 DIAGNOSIS — E039 Hypothyroidism, unspecified: Secondary | ICD-10-CM | POA: Diagnosis not present

## 2023-05-02 LAB — LAB REPORT - SCANNED
A1c: 6.9
Alb/Creat Ratio, Ur: 18 mg/g{creat}
Albumin, Urine POC: 9.3
Creatinine, POC: 51.7 mg/dL
EGFR (Non-African Amer.): 81
Free T4: 0.95 ng/dL
TSH: 2.55 (ref 0.41–5.90)

## 2023-05-04 DIAGNOSIS — J209 Acute bronchitis, unspecified: Secondary | ICD-10-CM | POA: Diagnosis not present

## 2023-05-06 ENCOUNTER — Ambulatory Visit: Payer: PPO | Admitting: Physician Assistant

## 2023-05-06 DIAGNOSIS — Z8669 Personal history of other diseases of the nervous system and sense organs: Secondary | ICD-10-CM

## 2023-05-06 DIAGNOSIS — M62838 Other muscle spasm: Secondary | ICD-10-CM

## 2023-05-06 DIAGNOSIS — Z79899 Other long term (current) drug therapy: Secondary | ICD-10-CM

## 2023-05-06 DIAGNOSIS — R5383 Other fatigue: Secondary | ICD-10-CM

## 2023-05-06 DIAGNOSIS — Z8659 Personal history of other mental and behavioral disorders: Secondary | ICD-10-CM

## 2023-05-06 DIAGNOSIS — M7062 Trochanteric bursitis, left hip: Secondary | ICD-10-CM

## 2023-05-06 DIAGNOSIS — Z1589 Genetic susceptibility to other disease: Secondary | ICD-10-CM

## 2023-05-06 DIAGNOSIS — Z8709 Personal history of other diseases of the respiratory system: Secondary | ICD-10-CM

## 2023-05-06 DIAGNOSIS — M19071 Primary osteoarthritis, right ankle and foot: Secondary | ICD-10-CM

## 2023-05-06 DIAGNOSIS — Z8639 Personal history of other endocrine, nutritional and metabolic disease: Secondary | ICD-10-CM

## 2023-05-06 DIAGNOSIS — M797 Fibromyalgia: Secondary | ICD-10-CM

## 2023-05-06 DIAGNOSIS — M8589 Other specified disorders of bone density and structure, multiple sites: Secondary | ICD-10-CM

## 2023-05-06 DIAGNOSIS — M5030 Other cervical disc degeneration, unspecified cervical region: Secondary | ICD-10-CM

## 2023-05-06 DIAGNOSIS — M0579 Rheumatoid arthritis with rheumatoid factor of multiple sites without organ or systems involvement: Secondary | ICD-10-CM

## 2023-05-06 DIAGNOSIS — M47816 Spondylosis without myelopathy or radiculopathy, lumbar region: Secondary | ICD-10-CM

## 2023-05-06 DIAGNOSIS — M19041 Primary osteoarthritis, right hand: Secondary | ICD-10-CM

## 2023-05-06 DIAGNOSIS — G8929 Other chronic pain: Secondary | ICD-10-CM

## 2023-05-06 NOTE — Progress Notes (Signed)
 Office Visit Note  Patient: Stephanie Norris             Date of Birth: 1955/09/05           MRN: [id number]             PCP: Benita Stabile, MD Referring: Benita Stabile, MD Visit Date: 05/13/2023 Occupation: @GUAROCC @  Subjective:  Medication monitoring   History of Present Illness: Stephanie Norris is a 68 y.o. female with history of seropositive rheumatoid arthritis and osteoarthritis.  Patient remains on Methotrexate 6 tablets by mouth every 7 days and folic acid 1 mg 2 tablets daily.  She is tolerating methotrexate without any side effects.  Patient states that she recently held methotrexate for 2 weeks while recovering from bronchitis.  She resumed methotrexate on Sunday.  Patient continues to experience intermittent arthralgias and joint stiffness particularly with weather changes.  She is also been having more frequent flares of fibromyalgia due to weather changes and stress levels.  She experiences intermittent discomfort in both hands and uses Voltaren gel topically as needed for pain relief.  Patient describes a sensation in her hands as uncomfortable.  She denies any active joint swelling at this time.  She continues to have chronic pain in both SI joints.  Activities of Daily Living:  Patient reports morning stiffness for a few minutes.   Patient Denies nocturnal pain.  Difficulty dressing/grooming: Denies Difficulty climbing stairs: Reports Difficulty getting out of chair: Denies Difficulty using hands for taps, buttons, cutlery, and/or writing: Reports  Review of Systems  Constitutional:  Positive for fatigue.  HENT:  Positive for mouth dryness. Negative for mouth sores and nose dryness.   Eyes:  Negative for pain and dryness.  Respiratory:  Positive for shortness of breath and difficulty breathing.   Cardiovascular:  Negative for chest pain and palpitations.  Gastrointestinal:  Positive for constipation. Negative for blood in stool and diarrhea.  Endocrine: Negative for  increased urination.  Genitourinary:  Negative for involuntary urination.  Musculoskeletal:  Positive for joint pain, joint pain, joint swelling, myalgias, muscle weakness, morning stiffness, muscle tenderness and myalgias. Negative for gait problem.  Skin:  Positive for rash. Negative for color change, hair loss and sensitivity to sunlight.  Allergic/Immunologic: Negative for susceptible to infections.  Neurological:  Positive for headaches. Negative for dizziness.  Hematological:  Negative for swollen glands.  Psychiatric/Behavioral:  Positive for depressed mood and sleep disturbance. The patient is nervous/anxious.     PMFS History:  Patient Active Problem List   Diagnosis Date Noted   Chronic rhinitis 05/07/2022   Sore in nostril 05/07/2022   Mucous cyst of digit of left hand 04/07/2020   Vertigo 08/20/2016   Eustachian tube dysfunction 04/17/2016   Abdominal pain 03/02/2016   Gastroenteritis 03/02/2016   Rheumatoid arthritis with rheumatoid factor of multiple sites without organ or systems involvement (HCC) 01/13/2016   HLA B27 (HLA B27 positive) 01/13/2016   Osteoarthritis of foot 01/13/2016   DJD (degenerative joint disease), cervical 01/13/2016   Spondylosis of lumbar region without myelopathy or radiculopathy 01/13/2016   Primary osteoarthritis of both hands 01/13/2016   Non-seasonal allergic rhinitis 08/13/2014   Stiffness of joints, not elsewhere classified, multiple sites 08/26/2013   Flu-like symptoms 03/02/2013   Lactic acidosis 03/02/2013   Sinus tachycardia 03/02/2013   Elevated lactic acid level 03/02/2013   Influenza due to identified novel influenza A virus with other respiratory manifestations 03/02/2013   Hyperglycemia 03/02/2013   Fibromyalgia  OSA (obstructive sleep apnea) 06/22/2011   DOE (dyspnea on exertion) 05/30/2011   Not well controlled moderate persistent asthma 03/30/2011    Past Medical History:  Diagnosis Date   Asthma    COPD (chronic  obstructive pulmonary disease) (HCC)    Diabetes mellitus without complication (HCC)    Type 2   Dizziness    Fibromyalgia    GERD (gastroesophageal reflux disease)    Headache    Hyperlipidemia    OSA (obstructive sleep apnea)    Osteoarthritis    PCOS (polycystic ovarian syndrome)    Raynaud disease    Rheumatoid arthritis (HCC)     Family History  Problem Relation Age of Onset   Asthma Mother    Rheum arthritis Mother    Allergic rhinitis Mother    Rheum arthritis Sister    Allergic rhinitis Sister    Heart failure Father    Heart disease Father    Rheum arthritis Sister    Allergic rhinitis Sister    Allergic rhinitis Brother    Allergies Other        "Everyone"   Heart disease Maternal Grandfather    Heart disease Maternal Grandmother    Heart disease Paternal Grandfather    Heart disease Paternal Grandmother    Past Surgical History:  Procedure Laterality Date   APPENDECTOMY     BACK SURGERY  2021   CHOLECYSTECTOMY     COLONOSCOPY W/ BIOPSIES  fall 2009   COMBINED HYSTERECTOMY VAGINAL / OOPHORECTOMY / A&P REPAIR  10/2002   endometriosis, cystocele, fibroids   FINGER SURGERY Left 05/09/2020   spurs removed from left index finger   KNEE SURGERY  1996   TOE SURGERY     VESICOVAGINAL FISTULA CLOSURE W/ TAH     Social History   Social History Narrative   Lives with husband   Retired    no children   Assoc degree   16 oz caffeine daily   Immunization History  Administered Date(s) Administered   Influenza Whole 10/07/2010, 11/06/2011   Influenza,inj,Quad PF,6+ Mos 11/05/2012, 11/02/2015, 11/22/2016, 11/15/2017   Influenza-Unspecified 11/05/2013   Moderna Sars-Covid-2 Vaccination 04/13/2019, 05/11/2019   Pneumococcal Conjugate-13 11/06/2014   Pneumococcal Polysaccharide-23 11/02/2015     Objective: Vital Signs: BP 126/81 (BP Location: Left Arm, Patient Position: Sitting, Cuff Size: Normal)   Pulse 79   Resp 13   Ht 4' 11.75" (1.518 m)   Wt 113 lb  12.8 oz (51.6 kg)   BMI 22.41 kg/m    Physical Exam Vitals and nursing note reviewed.  Constitutional:      Appearance: She is well-developed.  HENT:     Head: Normocephalic and atraumatic.  Eyes:     Conjunctiva/sclera: Conjunctivae normal.  Cardiovascular:     Rate and Rhythm: Normal rate and regular rhythm.     Heart sounds: Normal heart sounds.  Pulmonary:     Effort: Pulmonary effort is normal.     Breath sounds: Normal breath sounds.  Abdominal:     General: Bowel sounds are normal.     Palpations: Abdomen is soft.  Musculoskeletal:     Cervical back: Normal range of motion.  Lymphadenopathy:     Cervical: No cervical adenopathy.  Skin:    General: Skin is warm and dry.     Capillary Refill: Capillary refill takes less than 2 seconds.  Neurological:     Mental Status: She is alert and oriented to person, place, and time.  Psychiatric:  Behavior: Behavior normal.      Musculoskeletal Exam: C-spine limited ROM with lateral rotation.  Tenderness over both SI joints.  Shoulder joints, elbow joints, wrist joints, MCPs, PIPs, and DIPs good ROM with no synovitis.  PIP and DIP thickening.  Right hip has slightly limited range of motion.  Knee joints have good range of motion with no warmth or effusion.  Ankle joints have good range of motion with no tenderness or joint swelling.  CDAI Exam: CDAI Score: -- Patient Global: --; Provider Global: -- Swollen: --; Tender: -- Joint Exam 05/13/2023   No joint exam has been documented for this visit   There is currently no information documented on the homunculus. Go to the Rheumatology activity and complete the homunculus joint exam.  Investigation: No additional findings.  Imaging: No results found.  Recent Labs: Lab Results  Component Value Date   WBC 9.3 12/05/2022   HGB 13.7 12/05/2022   PLT 385 12/05/2022   NA 137 12/05/2022   K 4.3 12/05/2022   CL 102 12/05/2022   CO2 20 12/05/2022   GLUCOSE 196 (H)  12/05/2022   BUN 34 (H) 12/05/2022   CREATININE 0.82 12/05/2022   BILITOT 0.3 12/05/2022   ALKPHOS 54 09/10/2016   AST 16 12/05/2022   ALT 19 12/05/2022   PROT 6.8 12/05/2022   ALBUMIN 4.0 09/10/2016   CALCIUM 9.9 12/05/2022   GFRAA 96 07/26/2020    Speciality Comments: No specialty comments available.  Procedures:  No procedures performed Allergies: Nsaids, Bactrim [sulfamethoxazole-trimethoprim], Cephalexin, and Levofloxacin    Assessment / Plan:     Visit Diagnoses: Rheumatoid arthritis with rheumatoid factor of multiple sites without organ or systems involvement (HCC) - +CCP: She has no synovitis on examination today.  She has not had any signs or symptoms of a rheumatoid arthritis flare.  She has clinically been doing well taking methotrexate 6 tablets by mouth once weekly and folic acid 2 mg daily.  She is tolerating methotrexate without any side effects.  Patient recently held methotrexate for 2 weeks while recovering from bronchitis but resumed methotrexate on Sunday.  She experiences intermittent soreness in the DIP joints consistent with osteoarthritis but has no active inflammation on exam.  No medication changes will be made at this time.  She is advised to notify us if she develops signs or symptoms of a flare.  She will follow-up in the office in 5 months or sooner if needed.  High risk medication use - Methotrexate 6 tablets by mouth every 7 days and folic acid 1 mg 2 tablets daily.  CBC and CMP updated on 12/05/22.  Orders for CBC and CMP released today.  Discussed the importance of holding methotrexate if she develops signs or symptoms of an infection and to resume once the infection has completely cleared.  - Plan: CBC with Differential/Platelet, Comprehensive metabolic panel with GFR  HLA B27 (HLA B27 positive)  Primary osteoarthritis of both hands: She has PIP and DIP thickening consistent with osteoarthritis of both hands.  No synovitis noted on examination  today.  Trochanteric bursitis of left hip: Intermittent discomfort.  Primary osteoarthritis of both feet: No tenderness or inflammation of the ankle joints noted.  Chronic SI joint pain - Left SI joint injected on 11/22/21.  Patient continues to experience intermittent discomfort in both SI joints.  She has mild SI joint tenderness upon palpation bilaterally.  DDD (degenerative disc disease), cervical: C-spine has limited range of motion with discomfort.  Some trapezius muscle tension  tenderness noted bilaterally.  Spondylosis of lumbar spine: Chronic pain. Discectomy performed April 2021 by Dr. Lovell Sheehan.  No midline spinal tenderness.  Fibromyalgia: Patient has been experiencing more frequent and severe fibromyalgia flares.  She attributes these flares to weather changes as well as being under increased stress.  Discussed the importance of regular exercise, good sleep hygiene, and stress management.  She remains on Cymbalta as prescribed.  Trapezius muscle spasm: Patient continues to have trapezius muscle tension tenderness bilaterally.  Other fatigue: Chronic, stable.   Other medical conditions are listed as follows:  Osteopenia of multiple sites - 12/24/16 DEXA scan showed T score of -2.3, BMD 0.596 in the left femoral neck.  Patient has not had an updated bone density.  She was encouraged to follow-up with her PCP to discuss scheduling an updated bone density with her upcoming mammogram.  History of diabetes mellitus  History of depression  History of sleep apnea  History of asthma  Orders: Orders Placed This Encounter  Procedures   CBC with Differential/Platelet   Comprehensive metabolic panel with GFR   Meds ordered this encounter  Medications   methotrexate (RHEUMATREX) 2.5 MG tablet    Sig: Take 6 tablets (15 mg total) by mouth once a week.    Dispense:  72 tablet    Refill:  0     Follow-Up Instructions: Return in about 5 months (around 10/13/2023) for Rheumatoid  arthritis, Osteoarthritis.   Gearldine Bienenstock, PA-C  Note - This record has been created using Dragon software.  Chart creation errors have been sought, but may not always  have been located. Such creation errors do not reflect on  the standard of medical care.

## 2023-05-07 DIAGNOSIS — I10 Essential (primary) hypertension: Secondary | ICD-10-CM | POA: Diagnosis not present

## 2023-05-07 DIAGNOSIS — K219 Gastro-esophageal reflux disease without esophagitis: Secondary | ICD-10-CM | POA: Diagnosis not present

## 2023-05-07 DIAGNOSIS — K2970 Gastritis, unspecified, without bleeding: Secondary | ICD-10-CM | POA: Diagnosis not present

## 2023-05-07 DIAGNOSIS — G9009 Other idiopathic peripheral autonomic neuropathy: Secondary | ICD-10-CM | POA: Diagnosis not present

## 2023-05-07 DIAGNOSIS — M069 Rheumatoid arthritis, unspecified: Secondary | ICD-10-CM | POA: Diagnosis not present

## 2023-05-07 DIAGNOSIS — G4733 Obstructive sleep apnea (adult) (pediatric): Secondary | ICD-10-CM | POA: Diagnosis not present

## 2023-05-07 DIAGNOSIS — F331 Major depressive disorder, recurrent, moderate: Secondary | ICD-10-CM | POA: Diagnosis not present

## 2023-05-07 DIAGNOSIS — J4520 Mild intermittent asthma, uncomplicated: Secondary | ICD-10-CM | POA: Diagnosis not present

## 2023-05-07 DIAGNOSIS — J302 Other seasonal allergic rhinitis: Secondary | ICD-10-CM | POA: Diagnosis not present

## 2023-05-07 DIAGNOSIS — G8929 Other chronic pain: Secondary | ICD-10-CM | POA: Diagnosis not present

## 2023-05-07 DIAGNOSIS — M545 Low back pain, unspecified: Secondary | ICD-10-CM | POA: Diagnosis not present

## 2023-05-07 DIAGNOSIS — E1140 Type 2 diabetes mellitus with diabetic neuropathy, unspecified: Secondary | ICD-10-CM | POA: Diagnosis not present

## 2023-05-10 ENCOUNTER — Other Ambulatory Visit: Payer: Self-pay

## 2023-05-10 ENCOUNTER — Ambulatory Visit: Payer: PPO | Admitting: Allergy & Immunology

## 2023-05-10 VITALS — BP 120/70 | HR 93 | Temp 97.9°F | Resp 12

## 2023-05-10 DIAGNOSIS — K219 Gastro-esophageal reflux disease without esophagitis: Secondary | ICD-10-CM

## 2023-05-10 DIAGNOSIS — J4540 Moderate persistent asthma, uncomplicated: Secondary | ICD-10-CM

## 2023-05-10 DIAGNOSIS — J310 Chronic rhinitis: Secondary | ICD-10-CM

## 2023-05-10 MED ORDER — BREO ELLIPTA 200-25 MCG/ACT IN AEPB: 1.0000 | INHALATION_SPRAY | Freq: Every day | RESPIRATORY_TRACT | 5 refills | Status: DC

## 2023-05-10 MED ORDER — MONTELUKAST SODIUM 10 MG PO TABS: 10.0000 mg | ORAL_TABLET | Freq: Every day | ORAL | 1 refills | Status: DC

## 2023-05-10 MED ORDER — ALBUTEROL SULFATE (2.5 MG/3ML) 0.083% IN NEBU: 2.5000 mg | INHALATION_SOLUTION | Freq: Four times a day (QID) | RESPIRATORY_TRACT | 12 refills | Status: AC | PRN

## 2023-05-10 NOTE — Patient Instructions (Addendum)
 Asthma - Lung testing looked great today. - We are going to send in some refills for the nebulized albuterol  - Daily controller medication(s): Singulair 10mg  daily and Breo 200/66mcg one puff once daily - Rescue medications: albuterol 4 puffs every 4-6 hours as needed and albuterol nebulizer one vial every 4-6 hours as needed - Asthma control goals:  * Full participation in all desired activities (may need albuterol before activity) * Albuterol use two time or less a week on average (not counting use with activity) * Cough interfering with sleep two time or less a month * Oral steroids no more than once a year * No hospitalizations  Seasonal and perennial rhinitis - Continue allergen avoidance directed toward indoor and outdoor molds - Continue cetirizine 10 mg once a day as needed for runny nose or itch - Continue azelastine 2 sprays in each nostril once a day as needed for a runny nose - Consider saline nasal rinses as needed for nasal symptoms. Use this before any medicated nasal sprays for best result - Consider saline gel for the left nostril or as needed for dry nostrils.  Return in about 6 months (around 11/09/2023). You can have the follow up appointment with Dr. Dellis Anes or a Nurse Practicioner (our Nurse Practitioners are excellent and always have Physician oversight!).    Please inform us of any Emergency Department visits, hospitalizations, or changes in symptoms. Call us before going to the ED for breathing or allergy symptoms since we might be able to fit you in for a sick visit. Feel free to contact us anytime with any questions, problems, or concerns.  It was a pleasure to see you again today!  Websites that have reliable patient information: 1. American Academy of Asthma, Allergy, and Immunology: www.aaaai.org 2. Food Allergy Research and Education (FARE): foodallergy.org 3. Mothers of Asthmatics: http://www.asthmacommunitynetwork.org 4. American College of Allergy,  Asthma, and Immunology: www.acaai.org      "Like" [place] on Facebook and Instagram for our latest updates!      A healthy democracy works best when [employer] participate! Make sure you are registered to vote! If you have moved or changed any of your contact information, you will need to get this updated before voting! Scan the QR codes below to learn more!

## 2023-05-10 NOTE — Progress Notes (Signed)
 FOLLOW UP  Date of Service/Encounter:  05/10/23   Assessment:   Moderate persistent asthma, uncomplicated - well controlled with the current regimen   Chronic rhinitis   GERD - on a PPI   Nasal sore  Plan/Recommendations:   Asthma - Lung testing looked great today. - We are going to send in some refills for the nebulized albuterol  - Daily controller medication(s): Singulair 10mg  daily and Breo 200/75mcg one puff once daily - Rescue medications: albuterol 4 puffs every 4-6 hours as needed and albuterol nebulizer one vial every 4-6 hours as needed - Asthma control goals:  * Full participation in all desired activities (may need albuterol before activity) * Albuterol use two time or less a week on average (not counting use with activity) * Cough interfering with sleep two time or less a month * Oral steroids no more than once a year * No hospitalizations  Seasonal and perennial rhinitis - Continue allergen avoidance directed toward indoor and outdoor molds - Continue cetirizine 10 mg once a day as needed for runny nose or itch - Continue azelastine 2 sprays in each nostril once a day as needed for a runny nose - Consider saline nasal rinses as needed for nasal symptoms. Use this before any medicated nasal sprays for best result - Consider saline gel for the left nostril or as needed for dry nostrils.  Return in about 6 months (around 11/09/2023). You can have the follow up appointment with Dr. Dellis Anes or a Nurse Practicioner (our Nurse Practitioners are excellent and always have Physician oversight!).   Subjective:   Stephanie Norris is a 68 y.o. female presenting today for follow up of  Chief Complaint  Patient presents with   Follow-up    Asthma     Stephanie Norris has a history of the following: Patient Active Problem List   Diagnosis Date Noted   Chronic rhinitis 05/07/2022   Sore in nostril 05/07/2022   Mucous cyst of digit of left hand 04/07/2020   Vertigo  08/20/2016   Eustachian tube dysfunction 04/17/2016   Abdominal pain 03/02/2016   Gastroenteritis 03/02/2016   Rheumatoid arthritis with rheumatoid factor of multiple sites without organ or systems involvement (HCC) 01/13/2016   HLA B27 (HLA B27 positive) 01/13/2016   Osteoarthritis of foot 01/13/2016   DJD (degenerative joint disease), cervical 01/13/2016   Spondylosis of lumbar region without myelopathy or radiculopathy 01/13/2016   Primary osteoarthritis of both hands 01/13/2016   Non-seasonal allergic rhinitis 08/13/2014   Stiffness of joints, not elsewhere classified, multiple sites 08/26/2013   Flu-like symptoms 03/02/2013   Lactic acidosis 03/02/2013   Sinus tachycardia 03/02/2013   Elevated lactic acid level 03/02/2013   Influenza due to identified novel influenza A virus with other respiratory manifestations 03/02/2013   Hyperglycemia 03/02/2013   Fibromyalgia    OSA (obstructive sleep apnea) 06/22/2011   DOE (dyspnea on exertion) 05/30/2011   Not well controlled moderate persistent asthma 03/30/2011    History obtained from: chart review and patient.  Discussed the use of AI scribe software for clinical note transcription with the patient and/or guardian, who gave verbal consent to proceed.  Stephanie Norris is a 68 y.o. female presenting for a follow up visit.  She was last seen in October 2024.  At that time, when testing was normal.  We continued with her Breo 200 mcg 1 puff once daily.  We talked about lowering to the 100 mcg dose.  We continue with montelukast as well as  albuterol.  She continued to avoid molds.  We continue with Zyrtec and Astelin.  Since last visit, she has largely done well.   Asthma/Respiratory Symptom History: She is currently using Breo inhaler frequently and has not used her nebulizer in years due to a lack of medication. She also uses a nasal spray, Azelex, but has not tried using it twice a day. She is on Singulair (montelukast). She has been  experiencing a persistent cough and respiratory symptoms for the past two weeks following a recent episode of bronchitis. She was treated with a Z-Pak antibiotic from urgent care, which provided some relief over time. She typically experiences episodes of bronchitis at least once a year, sometimes twice.  Allergic Rhinitis Symptom History: No recent sinus infections, only the bronchitis episode. She has not used her nebulizer in years and does not have any medication for it. She is compliant with her current medications, including Breo and Singulair.  She remains on methotrexate 15 mg weekly for rheumatoid arthritis.   Additionally, she is on [place] for diabetes, which she obtains at a low cost.  Her social history includes managing household responsibilities, as her husband is currently less active due to a leg injury. She mentions that her husband was doing well with therapy but has regressed since stopping.  Otherwise, there have been no changes to her past medical history, surgical history, family history, or social history.    Review of systems otherwise negative other than that mentioned in the HPI.    Objective:   Blood pressure 120/70, pulse 93, temperature 97.9 F (36.6 C), resp. rate 12, SpO2 97%. There is no height or weight on file to calculate BMI.    Physical Exam Vitals reviewed.  Constitutional:      Appearance: Normal appearance. She is well-developed.     Comments: Pleasant.  Cooperative with exam. Smiling.   HENT:     Head: Normocephalic and atraumatic.     Right Ear: Tympanic membrane, ear canal and external ear normal. No drainage, swelling or tenderness. Tympanic membrane is not injected, scarred, erythematous, retracted or bulging.     Left Ear: Tympanic membrane, ear canal and external ear normal. No drainage, swelling or tenderness. Tympanic membrane is not injected, scarred, erythematous, retracted or bulging.     Nose: No nasal deformity, septal  deviation, mucosal edema or rhinorrhea.     Right Turbinates: Enlarged, swollen and pale.     Left Turbinates: Enlarged, swollen and pale.     Right Sinus: No maxillary sinus tenderness or frontal sinus tenderness.     Left Sinus: No maxillary sinus tenderness or frontal sinus tenderness.     Comments: No polyps.  Minimal rhinorrhea.    Mouth/Throat:     Mouth: Mucous membranes are not pale and not dry.     Pharynx: Uvula midline.  Eyes:     General: Lids are normal. Allergic shiner present.        Right eye: No discharge.        Left eye: No discharge.     Conjunctiva/sclera: Conjunctivae normal.     Right eye: Right conjunctiva is not injected. No chemosis.    Left eye: Left conjunctiva is not injected. No chemosis.    Pupils: Pupils are equal, round, and reactive to light.  Cardiovascular:     Rate and Rhythm: Normal rate and regular rhythm.     Heart sounds: Normal heart sounds.  Pulmonary:     Effort: Pulmonary effort is normal. No  tachypnea, accessory muscle usage or respiratory distress.     Breath sounds: Normal breath sounds. No wheezing, rhonchi or rales.  Chest:     Chest wall: No tenderness.  Abdominal:     Tenderness: There is no abdominal tenderness. There is no guarding or rebound.  Lymphadenopathy:     Head:     Right side of head: No submandibular, tonsillar or occipital adenopathy.     Left side of head: No submandibular, tonsillar or occipital adenopathy.     Cervical: No cervical adenopathy.  Skin:    Coloration: Skin is not pale.     Findings: No abrasion, erythema, petechiae or rash. Rash is not papular, urticarial or vesicular.  Neurological:     Mental Status: She is alert.  Psychiatric:        Behavior: Behavior is cooperative.      Diagnostic studies:    Spirometry: results normal (FEV1: 1.58/85%, FVC: 2.38/100%, FEV1/FVC: 66%).    Spirometry consistent with normal pattern.   Allergy Studies: none        Malachi Bonds, MD  [facility]

## 2023-05-13 ENCOUNTER — Telehealth: Payer: Self-pay

## 2023-05-13 ENCOUNTER — Encounter: Payer: Self-pay | Admitting: Physician Assistant

## 2023-05-13 ENCOUNTER — Ambulatory Visit: Attending: Physician Assistant | Admitting: Physician Assistant

## 2023-05-13 ENCOUNTER — Other Ambulatory Visit (HOSPITAL_COMMUNITY): Payer: Self-pay

## 2023-05-13 VITALS — BP 126/81 | HR 79 | Resp 13 | Ht 59.75 in | Wt 113.8 lb

## 2023-05-13 DIAGNOSIS — Z79899 Other long term (current) drug therapy: Secondary | ICD-10-CM | POA: Diagnosis not present

## 2023-05-13 DIAGNOSIS — M5030 Other cervical disc degeneration, unspecified cervical region: Secondary | ICD-10-CM | POA: Diagnosis not present

## 2023-05-13 DIAGNOSIS — M533 Sacrococcygeal disorders, not elsewhere classified: Secondary | ICD-10-CM | POA: Diagnosis not present

## 2023-05-13 DIAGNOSIS — M797 Fibromyalgia: Secondary | ICD-10-CM | POA: Diagnosis not present

## 2023-05-13 DIAGNOSIS — M19071 Primary osteoarthritis, right ankle and foot: Secondary | ICD-10-CM

## 2023-05-13 DIAGNOSIS — Z8669 Personal history of other diseases of the nervous system and sense organs: Secondary | ICD-10-CM

## 2023-05-13 DIAGNOSIS — M19042 Primary osteoarthritis, left hand: Secondary | ICD-10-CM

## 2023-05-13 DIAGNOSIS — G8929 Other chronic pain: Secondary | ICD-10-CM

## 2023-05-13 DIAGNOSIS — M47816 Spondylosis without myelopathy or radiculopathy, lumbar region: Secondary | ICD-10-CM

## 2023-05-13 DIAGNOSIS — M0579 Rheumatoid arthritis with rheumatoid factor of multiple sites without organ or systems involvement: Secondary | ICD-10-CM

## 2023-05-13 DIAGNOSIS — M19041 Primary osteoarthritis, right hand: Secondary | ICD-10-CM | POA: Diagnosis not present

## 2023-05-13 DIAGNOSIS — Z8709 Personal history of other diseases of the respiratory system: Secondary | ICD-10-CM

## 2023-05-13 DIAGNOSIS — Z8659 Personal history of other mental and behavioral disorders: Secondary | ICD-10-CM

## 2023-05-13 DIAGNOSIS — R5383 Other fatigue: Secondary | ICD-10-CM | POA: Diagnosis not present

## 2023-05-13 DIAGNOSIS — Z1589 Genetic susceptibility to other disease: Secondary | ICD-10-CM

## 2023-05-13 DIAGNOSIS — M8589 Other specified disorders of bone density and structure, multiple sites: Secondary | ICD-10-CM

## 2023-05-13 DIAGNOSIS — M62838 Other muscle spasm: Secondary | ICD-10-CM

## 2023-05-13 DIAGNOSIS — M19072 Primary osteoarthritis, left ankle and foot: Secondary | ICD-10-CM

## 2023-05-13 DIAGNOSIS — Z8639 Personal history of other endocrine, nutritional and metabolic disease: Secondary | ICD-10-CM

## 2023-05-13 DIAGNOSIS — M7062 Trochanteric bursitis, left hip: Secondary | ICD-10-CM

## 2023-05-13 MED ORDER — METHOTREXATE SODIUM 2.5 MG PO TABS: 15.0000 mg | ORAL_TABLET | ORAL | 0 refills | Status: DC

## 2023-05-13 NOTE — Telephone Encounter (Signed)
*  Asthma/Allergy  Pharmacy Patient Advocate Encounter   Received notification from CoverMyMeds that prior authorization for Albuterol Sulfate (2.5 MG/3ML)0.083% nebulizer solution  is required/requested.   Insurance verification completed.   The patient is insured through [facility] ADVANTAGE/RX ADVANCE .   Per test claim: The current 7 day co-pay is, $0.76.  No PA needed at this time. This test claim was processed through [facility]- copay amounts may vary at other pharmacies due to pharmacy/plan contracts, or as the patient moves through the different stages of their insurance plan.

## 2023-05-14 ENCOUNTER — Encounter: Payer: Self-pay | Admitting: Allergy & Immunology

## 2023-05-14 LAB — CBC WITH DIFFERENTIAL/PLATELET
Absolute Lymphocytes: 3087 {cells}/uL (ref 850–3900)
Absolute Monocytes: 769 {cells}/uL (ref 200–950)
Basophils Absolute: 73 {cells}/uL (ref 0–200)
Basophils Relative: 0.6 %
Eosinophils Absolute: 207 {cells}/uL (ref 15–500)
Eosinophils Relative: 1.7 %
HCT: 40.9 % (ref 35.0–45.0)
Hemoglobin: 13.9 g/dL (ref 11.7–15.5)
MCH: 32.3 pg (ref 27.0–33.0)
MCHC: 34 g/dL (ref 32.0–36.0)
MCV: 94.9 fL (ref 80.0–100.0)
MPV: 10.9 fL (ref 7.5–12.5)
Monocytes Relative: 6.3 %
Neutro Abs: 8064 {cells}/uL — ABNORMAL HIGH (ref 1500–7800)
Neutrophils Relative %: 66.1 %
Platelets: 406 10*3/uL — ABNORMAL HIGH (ref 140–400)
RBC: 4.31 10*6/uL (ref 3.80–5.10)
RDW: 12.4 % (ref 11.0–15.0)
Total Lymphocyte: 25.3 %
WBC: 12.2 10*3/uL — ABNORMAL HIGH (ref 3.8–10.8)

## 2023-05-14 LAB — COMPREHENSIVE METABOLIC PANEL WITH GFR
AG Ratio: 1.7 (calc) (ref 1.0–2.5)
ALT: 22 U/L (ref 6–29)
AST: 14 U/L (ref 10–35)
Albumin: 4.3 g/dL (ref 3.6–5.1)
Alkaline phosphatase (APISO): 79 U/L (ref 37–153)
BUN/Creatinine Ratio: 38 (calc) — ABNORMAL HIGH (ref 6–22)
BUN: 26 mg/dL — ABNORMAL HIGH (ref 7–25)
CO2: 22 mmol/L (ref 20–32)
Calcium: 9.8 mg/dL (ref 8.6–10.4)
Chloride: 106 mmol/L (ref 98–110)
Creat: 0.69 mg/dL (ref 0.60–1.00)
Globulin: 2.5 g/dL (ref 1.9–3.7)
Glucose, Bld: 223 mg/dL — ABNORMAL HIGH (ref 65–99)
Potassium: 4.4 mmol/L (ref 3.5–5.3)
Sodium: 139 mmol/L (ref 135–146)
Total Bilirubin: 0.3 mg/dL (ref 0.2–1.2)
Total Protein: 6.8 g/dL (ref 6.1–8.1)
eGFR: 93 mL/min/{1.73_m2} (ref >=60)

## 2023-05-14 NOTE — Progress Notes (Signed)
 Glucose is elevated-223. Rest of CMP stable.  WBC count is elevated-12.2. absolute neutrophils are elevated.  Patient recently recovered from bronchitis.   Platelet count is borderline elevated-406K. Rest of CBC WNL.

## 2023-07-02 ENCOUNTER — Other Ambulatory Visit: Payer: Self-pay | Admitting: Family Medicine

## 2023-09-02 DIAGNOSIS — I781 Nevus, non-neoplastic: Secondary | ICD-10-CM | POA: Diagnosis not present

## 2023-09-02 DIAGNOSIS — L7211 Pilar cyst: Secondary | ICD-10-CM | POA: Diagnosis not present

## 2023-09-05 DIAGNOSIS — E039 Hypothyroidism, unspecified: Secondary | ICD-10-CM | POA: Diagnosis not present

## 2023-09-05 DIAGNOSIS — E1140 Type 2 diabetes mellitus with diabetic neuropathy, unspecified: Secondary | ICD-10-CM | POA: Diagnosis not present

## 2023-09-05 DIAGNOSIS — I10 Essential (primary) hypertension: Secondary | ICD-10-CM | POA: Diagnosis not present

## 2023-09-09 DIAGNOSIS — G8929 Other chronic pain: Secondary | ICD-10-CM | POA: Diagnosis not present

## 2023-09-09 DIAGNOSIS — M0579 Rheumatoid arthritis with rheumatoid factor of multiple sites without organ or systems involvement: Secondary | ICD-10-CM | POA: Diagnosis not present

## 2023-09-09 DIAGNOSIS — E782 Mixed hyperlipidemia: Secondary | ICD-10-CM | POA: Diagnosis not present

## 2023-09-09 DIAGNOSIS — J4520 Mild intermittent asthma, uncomplicated: Secondary | ICD-10-CM | POA: Diagnosis not present

## 2023-09-09 DIAGNOSIS — I10 Essential (primary) hypertension: Secondary | ICD-10-CM | POA: Diagnosis not present

## 2023-09-09 DIAGNOSIS — K2970 Gastritis, unspecified, without bleeding: Secondary | ICD-10-CM | POA: Diagnosis not present

## 2023-09-09 DIAGNOSIS — M545 Low back pain, unspecified: Secondary | ICD-10-CM | POA: Diagnosis not present

## 2023-09-09 DIAGNOSIS — G9009 Other idiopathic peripheral autonomic neuropathy: Secondary | ICD-10-CM | POA: Diagnosis not present

## 2023-09-09 DIAGNOSIS — K219 Gastro-esophageal reflux disease without esophagitis: Secondary | ICD-10-CM | POA: Diagnosis not present

## 2023-09-09 DIAGNOSIS — E1140 Type 2 diabetes mellitus with diabetic neuropathy, unspecified: Secondary | ICD-10-CM | POA: Diagnosis not present

## 2023-09-09 DIAGNOSIS — J302 Other seasonal allergic rhinitis: Secondary | ICD-10-CM | POA: Diagnosis not present

## 2023-09-09 DIAGNOSIS — F331 Major depressive disorder, recurrent, moderate: Secondary | ICD-10-CM | POA: Diagnosis not present

## 2023-09-10 DIAGNOSIS — G8929 Other chronic pain: Secondary | ICD-10-CM | POA: Diagnosis not present

## 2023-09-10 DIAGNOSIS — R200 Anesthesia of skin: Secondary | ICD-10-CM | POA: Diagnosis not present

## 2023-09-19 DIAGNOSIS — H57813 Brow ptosis, bilateral: Secondary | ICD-10-CM | POA: Diagnosis not present

## 2023-09-19 DIAGNOSIS — L7211 Pilar cyst: Secondary | ICD-10-CM | POA: Diagnosis not present

## 2023-09-19 DIAGNOSIS — I872 Venous insufficiency (chronic) (peripheral): Secondary | ICD-10-CM | POA: Diagnosis not present

## 2023-09-19 DIAGNOSIS — H53483 Generalized contraction of visual field, bilateral: Secondary | ICD-10-CM | POA: Diagnosis not present

## 2023-09-19 DIAGNOSIS — H02834 Dermatochalasis of left upper eyelid: Secondary | ICD-10-CM | POA: Diagnosis not present

## 2023-09-19 DIAGNOSIS — Z01818 Encounter for other preprocedural examination: Secondary | ICD-10-CM | POA: Diagnosis not present

## 2023-09-19 DIAGNOSIS — H02412 Mechanical ptosis of left eyelid: Secondary | ICD-10-CM | POA: Diagnosis not present

## 2023-09-19 DIAGNOSIS — H02413 Mechanical ptosis of bilateral eyelids: Secondary | ICD-10-CM | POA: Diagnosis not present

## 2023-09-19 DIAGNOSIS — H02411 Mechanical ptosis of right eyelid: Secondary | ICD-10-CM | POA: Diagnosis not present

## 2023-09-19 DIAGNOSIS — H02831 Dermatochalasis of right upper eyelid: Secondary | ICD-10-CM | POA: Diagnosis not present

## 2023-09-19 DIAGNOSIS — H0279 Other degenerative disorders of eyelid and periocular area: Secondary | ICD-10-CM | POA: Diagnosis not present

## 2023-09-23 NOTE — Therapy (Incomplete)
 OUTPATIENT PHYSICAL THERAPY THORACOLUMBAR EVALUATION   Patient Name: Stephanie Norris MRN: [id number] DOB:03-Mar-1955, 68 y.o., female Today's Date: 09/23/2023  END OF SESSION:   Past Medical History:  Diagnosis Date   Asthma    COPD (chronic obstructive pulmonary disease) (HCC)    Diabetes mellitus without complication (HCC)    Type 2   Dizziness    Fibromyalgia    GERD (gastroesophageal reflux disease)    Headache    Hyperlipidemia    OSA (obstructive sleep apnea)    Osteoarthritis    PCOS (polycystic ovarian syndrome)    Raynaud disease    Rheumatoid arthritis (HCC)    Past Surgical History:  Procedure Laterality Date   APPENDECTOMY     BACK SURGERY  2021   CHOLECYSTECTOMY     COLONOSCOPY W/ BIOPSIES  fall 2009   COMBINED HYSTERECTOMY VAGINAL / OOPHORECTOMY / A&P REPAIR  10/2002   endometriosis, cystocele, fibroids   FINGER SURGERY Left 05/09/2020   spurs removed from left index finger   KNEE SURGERY  1996   TOE SURGERY     VESICOVAGINAL FISTULA CLOSURE W/ TAH     Patient Active Problem List   Diagnosis Date Noted   Chronic rhinitis 05/07/2022   Sore in nostril 05/07/2022   Mucous cyst of digit of left hand 04/07/2020   Vertigo 08/20/2016   Eustachian tube dysfunction 04/17/2016   Abdominal pain 03/02/2016   Gastroenteritis 03/02/2016   Rheumatoid arthritis with rheumatoid factor of multiple sites without organ or systems involvement (HCC) 01/13/2016   HLA B27 (HLA B27 positive) 01/13/2016   Osteoarthritis of foot 01/13/2016   DJD (degenerative joint disease), cervical 01/13/2016   Spondylosis of lumbar region without myelopathy or radiculopathy 01/13/2016   Primary osteoarthritis of both hands 01/13/2016   Non-seasonal allergic rhinitis 08/13/2014   Stiffness of joints, not elsewhere classified, multiple sites 08/26/2013   Flu-like symptoms 03/02/2013   Lactic acidosis 03/02/2013   Sinus tachycardia 03/02/2013   Elevated lactic acid level 03/02/2013    Influenza due to identified novel influenza A virus with other respiratory manifestations 03/02/2013   Hyperglycemia 03/02/2013   Fibromyalgia    OSA (obstructive sleep apnea) 06/22/2011   DOE (dyspnea on exertion) 05/30/2011   Not well controlled moderate persistent asthma 03/30/2011    PCP: ***  REFERRING PROVIDER: ***  REFERRING DIAG: ***  Rationale for Evaluation and Treatment: {HABREHAB:27488}  THERAPY DIAG:  No diagnosis found.  ONSET DATE: ***  SUBJECTIVE:  SUBJECTIVE STATEMENT: ***  PERTINENT HISTORY:  ***  PAIN:  Are you having pain? {OPRCPAIN:27236}  PRECAUTIONS: {Therapy precautions:24002}  RED FLAGS: {PT Red Flags:29287}   WEIGHT BEARING RESTRICTIONS: {Yes ***/No:24003}  FALLS:  Has patient fallen in last 6 months? {fallsyesno:27318}  LIVING ENVIRONMENT: Lives with: {OPRC lives with:25569::lives with their family} Lives in: {Lives in:25570} Stairs: {opstairs:27293} Has following equipment at home: {Assistive devices:23999}  OCCUPATION: ***  PLOF: {PLOF:24004}  PATIENT GOALS: ***  NEXT MD VISIT: ***  OBJECTIVE:  Note: Objective measures were completed at Evaluation unless otherwise noted.  DIAGNOSTIC FINDINGS:  ***  PATIENT SURVEYS:  {rehab surveys:24030}  COGNITION: Overall cognitive status: {cognition:24006}     SENSATION: {sensation:27233}  MUSCLE LENGTH: Hamstrings: Right *** deg; Left *** deg Debby test: Right *** deg; Left *** deg  POSTURE: {posture:25561}  PALPATION: ***  LUMBAR ROM:   AROM eval  Flexion   Extension   Right lateral flexion   Left lateral flexion   Right rotation   Left rotation    (Blank rows = not tested)  LOWER EXTREMITY ROM:     {AROM/PROM:27142}  Right eval Left eval  Hip flexion    Hip extension     Hip abduction    Hip adduction    Hip internal rotation    Hip external rotation    Knee flexion    Knee extension    Ankle dorsiflexion    Ankle plantarflexion    Ankle inversion    Ankle eversion     (Blank rows = not tested)  LOWER EXTREMITY MMT:    MMT Right eval Left eval  Hip flexion    Hip extension    Hip abduction    Hip adduction    Hip internal rotation    Hip external rotation    Knee flexion    Knee extension    Ankle dorsiflexion    Ankle plantarflexion    Ankle inversion    Ankle eversion     (Blank rows = not tested)  LUMBAR SPECIAL TESTS:  {lumbar special test:25242}  FUNCTIONAL TESTS:  {Functional tests:24029}  GAIT: Distance walked: *** Assistive device utilized: {Assistive devices:23999} Level of assistance: {Levels of assistance:24026} Comments: ***  TREATMENT DATE: ***                                                                                                                                 PATIENT EDUCATION:  Education details: *** Person educated: {Person educated:25204} Education method: {Education Method:25205} Education comprehension: {Education Comprehension:25206}  HOME EXERCISE PROGRAM: ***  ASSESSMENT:  CLINICAL IMPRESSION: Patient is a *** y.o. *** who was seen today for physical therapy evaluation and treatment for ***.   OBJECTIVE IMPAIRMENTS: {opptimpairments:25111}.   ACTIVITY LIMITATIONS: {activitylimitations:27494}  PARTICIPATION LIMITATIONS: {participationrestrictions:25113}  PERSONAL FACTORS: {Personal factors:25162} are also affecting patient's functional outcome.   REHAB POTENTIAL: {rehabpotential:25112}  CLINICAL DECISION MAKING: {clinical decision making:25114}  EVALUATION COMPLEXITY: {Evaluation complexity:25115}  GOALS: Goals reviewed with patient? {yes/no:20286}  SHORT TERM GOALS: Target date: ***  *** Baseline: Goal status: INITIAL  2.  *** Baseline:  Goal status:  INITIAL  3.  *** Baseline:  Goal status: INITIAL  4.  *** Baseline:  Goal status: INITIAL  5.  *** Baseline:  Goal status: INITIAL  6.  *** Baseline:  Goal status: INITIAL  LONG TERM GOALS: Target date: ***  *** Baseline:  Goal status: INITIAL  2.  *** Baseline:  Goal status: INITIAL  3.  *** Baseline:  Goal status: INITIAL  4.  *** Baseline:  Goal status: INITIAL  5.  *** Baseline:  Goal status: INITIAL  6.  *** Baseline:  Goal status: INITIAL  PLAN:  PT FREQUENCY: {rehab frequency:25116}  PT DURATION: {rehab duration:25117}  PLANNED INTERVENTIONS: {rehab planned interventions:25118::97110-Therapeutic exercises,97530- Therapeutic [phone]- Neuromuscular re-education,97535- Self Rjmz,02859- Manual therapy}.  PLAN FOR NEXT SESSION: ***   Greig GORMAN Quivers, PT 09/23/2023, 12:05 PM

## 2023-09-24 ENCOUNTER — Ambulatory Visit (HOSPITAL_COMMUNITY)

## 2023-10-03 ENCOUNTER — Encounter (HOSPITAL_COMMUNITY): Payer: Self-pay

## 2023-10-03 ENCOUNTER — Other Ambulatory Visit: Payer: Self-pay

## 2023-10-03 ENCOUNTER — Ambulatory Visit (HOSPITAL_COMMUNITY): Attending: Neurosurgery

## 2023-10-03 DIAGNOSIS — M79604 Pain in right leg: Secondary | ICD-10-CM | POA: Insufficient documentation

## 2023-10-03 DIAGNOSIS — Z7409 Other reduced mobility: Secondary | ICD-10-CM | POA: Insufficient documentation

## 2023-10-03 DIAGNOSIS — M5459 Other low back pain: Secondary | ICD-10-CM | POA: Diagnosis not present

## 2023-10-03 NOTE — Therapy (Signed)
 OUTPATIENT PHYSICAL THERAPY THORACOLUMBAR EVALUATION   Patient Name: Stephanie Norris MRN: [id number] DOB:[birth year], 68 y.o., female Today's Date: 10/03/2023  END OF SESSION:  PT End of Session - 10/03/23 1409     Visit Number 1    Date for PT Re-Evaluation 11/14/23    Authorization Type [employer]    Authorization Time Period no auth    Progress Note Due on Visit 10    PT Start Time 1146    PT Stop Time 1229    PT Time Calculation (min) 43 min    Activity Tolerance Patient tolerated treatment well    Behavior During Therapy WFL for tasks assessed/performed          Past Medical History:  Diagnosis Date   Asthma    COPD (chronic obstructive pulmonary disease) (HCC)    Diabetes mellitus without complication (HCC)    Type 2   Dizziness    Fibromyalgia    GERD (gastroesophageal reflux disease)    Headache    Hyperlipidemia    OSA (obstructive sleep apnea)    Osteoarthritis    PCOS (polycystic ovarian syndrome)    Raynaud disease    Rheumatoid arthritis (HCC)    Past Surgical History:  Procedure Laterality Date   APPENDECTOMY     BACK SURGERY  2021   CHOLECYSTECTOMY     COLONOSCOPY W/ BIOPSIES  fall 2009   COMBINED HYSTERECTOMY VAGINAL / OOPHORECTOMY / A&P REPAIR  10/2002   endometriosis, cystocele, fibroids   FINGER SURGERY Left 05/09/2020   spurs removed from left index finger   KNEE SURGERY  1996   TOE SURGERY     VESICOVAGINAL FISTULA CLOSURE W/ TAH     Patient Active Problem List   Diagnosis Date Noted   Chronic rhinitis 05/07/2022   Sore in nostril 05/07/2022   Mucous cyst of digit of left hand 04/07/2020   Vertigo 08/20/2016   Eustachian tube dysfunction 04/17/2016   Abdominal pain 03/02/2016   Gastroenteritis 03/02/2016   Rheumatoid arthritis with rheumatoid factor of multiple sites without organ or systems involvement (HCC) 01/13/2016   HLA B27 (HLA B27 positive) 01/13/2016   Osteoarthritis of foot 01/13/2016   DJD (degenerative  joint disease), cervical 01/13/2016   Spondylosis of lumbar region without myelopathy or radiculopathy 01/13/2016   Primary osteoarthritis of both hands 01/13/2016   Non-seasonal allergic rhinitis 08/13/2014   Stiffness of joints, not elsewhere classified, multiple sites 08/26/2013   Flu-like symptoms 03/02/2013   Lactic acidosis 03/02/2013   Sinus tachycardia 03/02/2013   Elevated lactic acid level 03/02/2013   Influenza due to identified novel influenza A virus with other respiratory manifestations 03/02/2013   Hyperglycemia 03/02/2013   Fibromyalgia    OSA (obstructive sleep apnea) 06/22/2011   DOE (dyspnea on exertion) 05/30/2011   Not well controlled moderate persistent asthma 03/30/2011    PCP: Shona Norleen PEDLAR, MD   REFERRING PROVIDER: Mavis Purchase, MD  REFERRING DIAG: G89.29 (ICD-10-CM) - Other chronic pain  Rationale for Evaluation and Treatment: Rehabilitation  THERAPY DIAG:  Other low back pain  Pain in right leg  Impaired functional mobility, balance, and endurance  ONSET DATE: 3 months ago  SUBJECTIVE:  SUBJECTIVE STATEMENT: Pt states it hurts on right anterior thigh, thinks it is unrelated to the back surgery as that was for LLE. Pt can not recall any trauma or strenuous activity other than pulling up on a bike seat several times max effort a few months ago. Pt states pain is a tingling feeling that will go away for a couple of days and then will go away for 3-4 days. Pt states the pain never lasts all day, gets better with movement and get worse with prolonged sitting. Pt states posture is poor during watching tv or on the computer. Pt states toes are numb due to being a heel walker for years before they removed her sesamoid bone.   PERTINENT HISTORY:  Back surgery several years  ago, pain gone. Fibromyalgia  Hx of trauma and sx to feet, sensation changes/balance issues PAIN:  Are you having pain? Yes: NPRS scale: 3/10, 5-6/10 pain Pain location: right anterior lateral thigh Pain description: annoying, tingling, pins sticking feeling Aggravating factors: sitting for a long period of time, after church Relieving factors: standing up and walking around  PRECAUTIONS: None  RED FLAGS: None   WEIGHT BEARING RESTRICTIONS: No  FALLS:  Has patient fallen in last 6 months? No   OCCUPATION: retired, Animator work  PLOF: Independent and Independent with basic ADLs  PATIENT GOALS: decreased the pain, strengthen you legs, go up stairs better, improve balance  NEXT MD VISIT: after therapy  OBJECTIVE:  Note: Objective measures were completed at Evaluation unless otherwise noted.  DIAGNOSTIC FINDINGS:  Mild anterior spurring was noted.  No significant disc space narrowing was  noted.  Facet joint arthropathy was noted.   Impression: These findings are consistent with facet joint arthropathy.  PATIENT SURVEYS:  LEFS: 57/80   COGNITION: Overall cognitive status: Within functional limits for tasks assessed     SENSATION: Light touch: Impaired , front of shins and toes   POSTURE: rounded shoulders, forward head, decreased lumbar lordosis, and flexed trunk   PALPATION: Pt demonstrates increased tenderness to bilateral right anterior lateral thighs.  LUMBAR ROM:   AROM eval  Flexion 80, slight increase in RLE pain  Extension 20, no increased pain  Right lateral flexion 25, no increase  Left lateral flexion 25, increase right sided low back pain  Right rotation WFL, no increased pain  Left rotation WFL, increased low back pain   (Blank rows = not tested)  LOWER EXTREMITY ROM:     Active  Right eval Left eval  Hip flexion    Hip extension    Hip abduction    Hip adduction    Hip internal rotation    Hip external rotation    Knee flexion     Knee extension    Ankle dorsiflexion    Ankle plantarflexion    Ankle inversion    Ankle eversion     (Blank rows = not tested)  LOWER EXTREMITY MMT:    MMT Right eval Left eval  Hip flexion 3+pain 4-  Hip extension 3 3+  Hip abduction 4- 4  Hip adduction 4- 4  Hip internal rotation    Hip external rotation    Knee flexion 5 5  Knee extension 4-, pain 4  Ankle dorsiflexion 4-, pain in right quads 4  Ankle plantarflexion    Ankle inversion    Ankle eversion     (Blank rows = not tested)  LUMBAR SPECIAL TESTS:  Straight leg raise test: TBA and Slump test: TBA  FUNCTIONAL TESTS:  5 times sit to stand: 15.4 seconds 2 minute walk test: 274 feet, slight increase in back and RLE pain SLS 10/03/23: R: 7.08s L: 2.2 s  GAIT: Distance walked: 274 feet during Assistive device utilized: None Level of assistance: Complete Independence Comments: Pt demonstrates decreased gait speed, flat foot strike, ER feet bilaterally, and wide base of support strategy utilized.  TREATMENT DATE:  10/03/2023  Evaluation: -ROM measured, Strength assessed, HEP prescribed, pt educated on prognosis, findings, and importance of HEP compliance if given.                                                                                      PATIENT EDUCATION:  Education details: Pt was educated on findings of PT evaluation, prognosis, frequency of therapy visits and rationale, attendance policy, and HEP if given.   Person educated: Patient Education method: Explanation, Verbal cues, and Handouts Education comprehension: verbalized understanding, verbal cues required, and needs further education  HOME EXERCISE PROGRAM: Access Code: [email or web address] URL: [email or web address] Date: 10/03/2023 Prepared by: Lang Ada  Exercises - Supine Lower Trunk Rotation  - 1 x daily - 7 x weekly - 3 sets - 10 reps - Seated Scapular Retraction  - 1 x daily - 7 x weekly - 3 sets - 10 reps - Supine  Bridge  - 1 x daily - 7 x weekly - 3 sets - 10 reps - Standing Single Leg Stance with Counter Support  - 1 x daily - 7 x weekly - 3 sets - 10 reps  ASSESSMENT:  CLINICAL IMPRESSION: Patient is a 68 y.o. female who was seen today for physical therapy evaluation and treatment for G89.29 (ICD-10-CM) - Other chronic pain.   Patient demonstrates decreased core/LE strength, abnormal pain rating in low back and R thigh, and impaired balance. Patient also demonstrates difficulty with ambulation during today's session with decreased stride length, velocity, and other gait deviations as noted above. Patient also demonstrates increased tenderness to palpation of RLE anterior thigh compared to the LLE although some tenderness noted bilaterally. Patient requires education on role of PT, importance of increased physical activity, and prognosis. Patient would benefit from skilled physical therapy for decreased low back/LE pain, increased activity tolerance, increased LE/core strength, and balance for improved gait quality, return to higher level of function with ADLs, and progress towards therapy goals.   OBJECTIVE IMPAIRMENTS: Abnormal gait, decreased activity tolerance, decreased balance, decreased endurance, decreased mobility, difficulty walking, decreased ROM, decreased strength, impaired sensation, postural dysfunction, and pain.   ACTIVITY LIMITATIONS: carrying, lifting, bending, sitting, standing, squatting, stairs, transfers, and bed mobility  PARTICIPATION LIMITATIONS: meal prep, cleaning, laundry, driving, shopping, community activity, and yard work  PERSONAL FACTORS: Age, Fitness, Time since onset of injury/illness/exacerbation, and 1 comorbidity: hx of back surgery are also affecting patient's functional outcome.   REHAB POTENTIAL: Fair hx of low back surgery and sedentary lifestyle  CLINICAL DECISION MAKING: Stable/uncomplicated  EVALUATION COMPLEXITY: Low   GOALS: Goals reviewed with  patient? No  SHORT TERM GOALS: Target date: 10/24/23  Pt will be independent with HEP in order to demonstrate participation in Physical Therapy POC.  Baseline: Goal status: INITIAL  2.  Pt will report 1/10 pain with mobility in order to demonstrate improved pain with ADLs.  Baseline: 3/10 Goal status: INITIAL  LONG TERM GOALS: Target date: 11/15/23  Pt will improve 5TSTS by at least 2.3 seconds in order to demonstrate improved functional strength to return to desired activities.  Baseline: see objective.  Goal status: INITIAL  2.  Pt will improve 2 MWT by 50 feet in order to demonstrate improved functional ambulatory capacity in community setting.  Baseline: see objective.  Goal status: INITIAL  3.  Pt will improve LEFS score by 9 points in order to demonstrate improved pain with functional goals and outcomes. Baseline: see objective.  Goal status: INITIAL  4.  Pt will report 1/10 pain in right quadriceps region at worst with mobility in order to demonstrate reduced pain with ADLs lasting greater than 30 minutes.  Baseline: see objective.  Goal status: INITIAL   PLAN:  PT FREQUENCY: 1-2x/week  PT DURATION: 6 weeks  PLANNED INTERVENTIONS: 97110-Therapeutic exercises, 97530- Therapeutic activity, 97112- Neuromuscular re-education, 97535- Self Care, 02859- Manual therapy, [phone]- Gait training, Patient/Family education, Balance training, Stair training, Joint mobilization, Spinal mobilization, Vestibular training, DME instructions, Cryotherapy, and Moist heat.  PLAN FOR NEXT SESSION: lumbar special testing see above, progress core/LE strengthening, incorporate balance and progress functional transfer capabilities with floor to standing transfer   Lang Ada, PT, DPT [facility] Office: [phone] 2:36 PM, 10/03/23

## 2023-10-09 ENCOUNTER — Ambulatory Visit (INDEPENDENT_AMBULATORY_CARE_PROVIDER_SITE_OTHER): Admitting: Podiatry

## 2023-10-09 DIAGNOSIS — M25871 Other specified joint disorders, right ankle and foot: Secondary | ICD-10-CM | POA: Diagnosis not present

## 2023-10-09 DIAGNOSIS — M216X1 Other acquired deformities of right foot: Secondary | ICD-10-CM

## 2023-10-09 NOTE — Progress Notes (Unsigned)
 Subjective:  Patient ID: Stephanie Norris, female    DOB: 03/16/1955,  MRN: [id number]  Chief Complaint  Patient presents with   Toe Pain    Left foot 5th toe turns purple no pain     68 y.o. female presents with the above complaint.  Patient presents with right first metatarsophalangeal joint pain/sesamoiditis pain.  Patient has a history of MPJ fusion to the right side.  She is a diabetic wanted get it evaluated hurts with ambulation worse with pressure pain scale 7 out of 10 dull aching nature.  She wanted to discuss treatment options for this.  Denies any other acute complaints.   Review of Systems: Negative except as noted in the HPI. Denies N/V/F/Ch.  Past Medical History:  Diagnosis Date   Asthma    COPD (chronic obstructive pulmonary disease) (HCC)    Diabetes mellitus without complication (HCC)    Type 2   Dizziness    Fibromyalgia    GERD (gastroesophageal reflux disease)    Headache    Hyperlipidemia    OSA (obstructive sleep apnea)    Osteoarthritis    PCOS (polycystic ovarian syndrome)    Raynaud disease    Rheumatoid arthritis (HCC)     Current Outpatient Medications:    albuterol  (PROVENTIL ) (2.5 MG/3ML) 0.083% nebulizer solution, Take 3 mLs (2.5 mg total) by nebulization every 6 (six) hours as needed for wheezing or shortness of breath., Disp: 75 mL, Rfl: 12   albuterol  (VENTOLIN  HFA) 108 (90 Base) MCG/ACT inhaler, Inhale 2 puffs into the lungs every 4 (four) hours as needed for wheezing or shortness of breath., Disp: 18 g, Rfl: 1   azelastine  (ASTELIN ) 0.1 % nasal spray, INSTILL 1 SPRAY INTO EACH NOSTRIL TWICE DAILY AS NEEDED FOR RUNNY NOSE, Disp: 30 mL, Rfl: 2   BREO ELLIPTA  200-25 MCG/ACT AEPB, Inhale 1 puff into the lungs daily., Disp: 60 each, Rfl: 5   cetirizine  (ZYRTEC ) 10 MG tablet, Take 1 tablet (10 mg total) by mouth daily., Disp: 90 tablet, Rfl: 3   Continuous Blood Gluc Sensor (FREESTYLE LIBRE SENSOR SYSTEM) MISC, as needed., Disp: , Rfl: 3    dapagliflozin propanediol (FARXIGA) 10 MG TABS tablet, Take 10 mg by mouth daily., Disp: , Rfl:    diclofenac  sodium (VOLTAREN ) 1 % GEL, Apply 3 grams to three large joints up to three times daily as needed, Disp: 3 Tube, Rfl: 3   DULoxetine  (CYMBALTA ) 60 MG capsule, Take 120 mg by mouth daily., Disp: , Rfl:    ezetimibe (ZETIA) 10 MG tablet, Take 10 mg by mouth daily., Disp: , Rfl:    folic acid  (FOLVITE ) 1 MG tablet, folic acid  1 mg tablet  Take 1 tablet every day by oral route., Disp: , Rfl:    glipiZIDE (GLUCOTROL) 5 MG tablet, Take 5 mg by mouth daily., Disp: , Rfl:    levothyroxine (SYNTHROID) 50 MCG tablet, Take 50 mcg by mouth daily., Disp: , Rfl:    Melatonin 10 MG TABS, , Disp: , Rfl:    metFORMIN (GLUCOPHAGE-XR) 500 MG 24 hr tablet, Take 1,000 mg by mouth 2 (two) times daily. , Disp: , Rfl:    methocarbamol  (ROBAXIN ) 500 MG tablet, Take 500 mg by mouth at bedtime. (Patient not taking: Reported on 05/13/2023), Disp: , Rfl:    methotrexate  (RHEUMATREX) 2.5 MG tablet, Take 6 tablets (15 mg total) by mouth once a week., Disp: 72 tablet, Rfl: 0   montelukast  (SINGULAIR ) 10 MG tablet, Take 1 tablet (10 mg  total) by mouth at bedtime., Disp: 90 tablet, Rfl: 1   Multiple Vitamins-Minerals (LUTEIN-ZEAXANTHIN PO), Take by mouth daily., Disp: , Rfl:    Multiple Vitamins-Minerals (MULTIVITAMIN PO), Take by mouth., Disp: , Rfl:    pantoprazole  (PROTONIX ) 40 MG tablet, Take 1 tablet by mouth 2 (two) times daily. , Disp: , Rfl: 5   progesterone  (PROMETRIUM ) 100 MG capsule, Take 100 mg by mouth at bedtime. (Patient not taking: Reported on 05/13/2023), Disp: , Rfl:    valACYclovir (VALTREX) 1000 MG tablet, as needed., Disp: , Rfl:   Social History   Tobacco Use  Smoking Status Never   Passive exposure: Never  Smokeless Tobacco Never    Allergies  Allergen Reactions   Nsaids Anaphylaxis    Patient states she can tolerate ibuprofen.    Bactrim [Sulfamethoxazole -Trimethoprim] Nausea Only    Upset  stomach   Cephalexin  Rash   Levofloxacin Rash   Objective:  There were no vitals filed for this visit. There is no height or weight on file to calculate BMI. Constitutional Well developed. Well nourished.  Vascular Dorsalis pedis pulses palpable bilaterally. Posterior tibial pulses palpable bilaterally. Capillary refill normal to all digits.  No cyanosis or clubbing noted. Pedal hair growth normal.  Neurologic Normal speech. Oriented to person, place, and time. Epicritic sensation to light touch grossly present bilaterally.  Dermatologic Nails well groomed and normal in appearance. No open wounds. No skin lesions.  Orthopedic: First metatarsophalangeal joint Hurts with Ambulation Hurts with Pressure No Range Of Motion of the First MTP.  History of Fusion on the Right Side.  Pain of the Sesamoidal Complex.  No Pain at the First MPJ No Pain of the Hardware   Radiographs: None Assessment:   1. Sesamoiditis of right foot    Plan:  Patient was evaluated and treated and all questions answered.  Right first sesamoiditis with underlying plantarflexed metatarsal - All questions and concerns were discussed with the patient extensive due to given the amount of pain that she is having should benefit from a steroid injection to help decrease the inflammatory component of her foot pain.  Patient agrees with plan elected proceed with steroid injection -A steroid injection was performed at sesamoid complex using 1% plain Lidocaine  and 10 mg of Kenalog . This was well tolerated.   No follow-ups on file.

## 2023-10-11 ENCOUNTER — Ambulatory Visit (HOSPITAL_COMMUNITY)

## 2023-10-14 ENCOUNTER — Ambulatory Visit (HOSPITAL_COMMUNITY): Attending: Neurosurgery | Admitting: Physical Therapy

## 2023-10-14 DIAGNOSIS — Z7409 Other reduced mobility: Secondary | ICD-10-CM | POA: Insufficient documentation

## 2023-10-14 DIAGNOSIS — M79604 Pain in right leg: Secondary | ICD-10-CM | POA: Diagnosis not present

## 2023-10-14 DIAGNOSIS — M5459 Other low back pain: Secondary | ICD-10-CM | POA: Diagnosis not present

## 2023-10-14 NOTE — Therapy (Signed)
 OUTPATIENT PHYSICAL THERAPY THORACOLUMBAR TREATMENT   Patient Name: Stephanie Norris MRN: [id number] DOB:04-Oct-1955, 68 y.o., female Today's Date: 10/14/2023  END OF SESSION:  PT End of Session - 10/14/23 1234     Visit Number 2    Number of Visits 10    Date for PT Re-Evaluation 11/14/23    Authorization Type [employer]    Authorization Time Period no auth    Progress Note Due on Visit 10    PT Start Time 1104    PT Stop Time 1145    PT Time Calculation (min) 41 min    Activity Tolerance Patient tolerated treatment well    Behavior During Therapy WFL for tasks assessed/performed           Past Medical History:  Diagnosis Date   Asthma    COPD (chronic obstructive pulmonary disease) (HCC)    Diabetes mellitus without complication (HCC)    Type 2   Dizziness    Fibromyalgia    GERD (gastroesophageal reflux disease)    Headache    Hyperlipidemia    OSA (obstructive sleep apnea)    Osteoarthritis    PCOS (polycystic ovarian syndrome)    Raynaud disease    Rheumatoid arthritis (HCC)    Past Surgical History:  Procedure Laterality Date   APPENDECTOMY     BACK SURGERY  2021   CHOLECYSTECTOMY     COLONOSCOPY W/ BIOPSIES  fall 2009   COMBINED HYSTERECTOMY VAGINAL / OOPHORECTOMY / A&P REPAIR  10/2002   endometriosis, cystocele, fibroids   FINGER SURGERY Left 05/09/2020   spurs removed from left index finger   KNEE SURGERY  1996   TOE SURGERY     VESICOVAGINAL FISTULA CLOSURE W/ TAH     Patient Active Problem List   Diagnosis Date Noted   Chronic rhinitis 05/07/2022   Sore in nostril 05/07/2022   Mucous cyst of digit of left hand 04/07/2020   Vertigo 08/20/2016   Eustachian tube dysfunction 04/17/2016   Abdominal pain 03/02/2016   Gastroenteritis 03/02/2016   Rheumatoid arthritis with rheumatoid factor of multiple sites without organ or systems involvement (HCC) 01/13/2016   HLA B27 (HLA B27 positive) 01/13/2016   Osteoarthritis of foot 01/13/2016    DJD (degenerative joint disease), cervical 01/13/2016   Spondylosis of lumbar region without myelopathy or radiculopathy 01/13/2016   Primary osteoarthritis of both hands 01/13/2016   Non-seasonal allergic rhinitis 08/13/2014   Stiffness of joints, not elsewhere classified, multiple sites 08/26/2013   Flu-like symptoms 03/02/2013   Lactic acidosis 03/02/2013   Sinus tachycardia 03/02/2013   Elevated lactic acid level 03/02/2013   Influenza due to identified novel influenza A virus with other respiratory manifestations 03/02/2013   Hyperglycemia 03/02/2013   Fibromyalgia    OSA (obstructive sleep apnea) 06/22/2011   DOE (dyspnea on exertion) 05/30/2011   Not well controlled moderate persistent asthma 03/30/2011    PCP: Shona Norleen PEDLAR, MD   REFERRING PROVIDER: Mavis Purchase, MD  REFERRING DIAG: G89.29 (ICD-10-CM) - Other chronic pain  Rationale for Evaluation and Treatment: Rehabilitation  THERAPY DIAG:  Other low back pain  Pain in right leg  Impaired functional mobility, balance, and endurance  ONSET DATE: 3 months ago  SUBJECTIVE:  SUBJECTIVE STATEMENT: Pt states the last time she had pain was a couple days after her first appt.  Reports her rt quad is tender but no lower back pain.     Pt states it hurts on right anterior thigh, thinks it is unrelated to the back surgery as that was for LLE. Pt can not recall any trauma or strenuous activity other than pulling up on a bike seat several times max effort a few months ago. Pt states pain is a tingling feeling that will go away for a couple of days and then will go away for 3-4 days. Pt states the pain never lasts all day, gets better with movement and get worse with prolonged sitting. Pt states posture is poor during watching tv or on the  computer. Pt states toes are numb due to being a heel walker for years before they removed her sesamoid bone.   PERTINENT HISTORY:  Back surgery several years ago, pain gone. Fibromyalgia  Hx of trauma and sx to feet, sensation changes/balance issues PAIN:  Are you having pain? Yes: NPRS scale: 3/10, 5-6/10 pain Pain location: right anterior lateral thigh Pain description: annoying, tingling, pins sticking feeling Aggravating factors: sitting for a long period of time, after church Relieving factors: standing up and walking around  PRECAUTIONS: None  RED FLAGS: None   WEIGHT BEARING RESTRICTIONS: No  FALLS:  Has patient fallen in last 6 months? No   OCCUPATION: retired, Animator work  PLOF: Independent and Independent with basic ADLs  PATIENT GOALS: decreased the pain, strengthen you legs, go up stairs better, improve balance  NEXT MD VISIT: after therapy  OBJECTIVE:  Note: Objective measures were completed at Evaluation unless otherwise noted.  DIAGNOSTIC FINDINGS:  Mild anterior spurring was noted.  No significant disc space narrowing was  noted.  Facet joint arthropathy was noted.   Impression: These findings are consistent with facet joint arthropathy.  PATIENT SURVEYS:  LEFS: 57/80   COGNITION: Overall cognitive status: Within functional limits for tasks assessed     SENSATION: Light touch: Impaired , front of shins and toes   POSTURE: rounded shoulders, forward head, decreased lumbar lordosis, and flexed trunk   PALPATION: Pt demonstrates increased tenderness to bilateral right anterior lateral thighs.  LUMBAR ROM:   AROM eval  Flexion 80, slight increase in RLE pain  Extension 20, no increased pain  Right lateral flexion 25, no increase  Left lateral flexion 25, increase right sided low back pain  Right rotation WFL, no increased pain  Left rotation WFL, increased low back pain   (Blank rows = not tested)  LOWER EXTREMITY ROM:     Active   Right eval Left eval  Hip flexion    Hip extension    Hip abduction    Hip adduction    Hip internal rotation    Hip external rotation    Knee flexion    Knee extension    Ankle dorsiflexion    Ankle plantarflexion    Ankle inversion    Ankle eversion     (Blank rows = not tested)  LOWER EXTREMITY MMT:    MMT Right eval Left eval  Hip flexion 3+pain 4-  Hip extension 3 3+  Hip abduction 4- 4  Hip adduction 4- 4  Hip internal rotation    Hip external rotation    Knee flexion 5 5  Knee extension 4-, pain 4  Ankle dorsiflexion 4-, pain in right quads 4  Ankle plantarflexion  Ankle inversion    Ankle eversion     (Blank rows = not tested)  LUMBAR SPECIAL TESTS:  Straight leg raise test: TBA and Slump test: TBA  FUNCTIONAL TESTS:  5 times sit to stand: 15.4 seconds 2 minute walk test: 274 feet, slight increase in back and RLE pain SLS 10/03/23: R: 7.08s L: 2.2 s  GAIT: Distance walked: 274 feet during Assistive device utilized: None Level of assistance: Complete Independence Comments: Pt demonstrates decreased gait speed, flat foot strike, ER feet bilaterally, and wide base of support strategy utilized.  TREATMENT DATE:  10/14/23 Reviewed goals Seated:  scapular retraction 10X5  Long sitting hamstring stretch 30 each LE  Sit to stands  Supine:  LTR 10X5 each  Bridge 10X  SLR 10X each  Hamstring stretch with towel 3X30 each Sidelying hip abduction 10X each Standing: SLS each  Tandem stance  Hip abduction 10X each  Hip extension 10X each    10/03/2023  Evaluation: -ROM measured, Strength assessed, HEP prescribed, pt educated on prognosis, findings, and importance of HEP compliance if given.                                                                                      PATIENT EDUCATION:  Education details: Pt was educated on findings of PT evaluation, prognosis, frequency of therapy visits and rationale, attendance policy, and HEP if  given.   Person educated: Patient Education method: Explanation, Verbal cues, and Handouts Education comprehension: verbalized understanding, verbal cues required, and needs further education  HOME EXERCISE PROGRAM: Access Code: [email or web address] URL: [email or web address] Date: 10/03/2023 Prepared by: Lang Ada  Exercises - Supine Lower Trunk Rotation  - 1 x daily - 7 x weekly - 3 sets - 10 reps - Seated Scapular Retraction  - 1 x daily - 7 x weekly - 3 sets - 10 reps - Supine Bridge  - 1 x daily - 7 x weekly - 3 sets - 10 reps - Standing Single Leg Stance with Counter Support  - 1 x daily - 7 x weekly - 3 sets - 10 reps  Access Code: [email or web address] URL: [email or web address] Date: 10/14/2023 Prepared by: Greig Fuse Exercises - Small Range Straight Leg Raise  - 2 x daily - 7 x weekly - 10 reps - Standing Hip Abduction with Counter Support  - 1 x daily - 7 x weekly - 10 reps - Standing Hip Extension with Counter Support  - 1 x daily - 7 x weekly - 10 reps - Tandem Stance with Support  - 1 x daily - 7 x weekly - 3 reps - 30 sec hold - Sit to Stand  - 1 x daily - 7 x weekly - 10 reps - Seated Table Hamstring Stretch  - 2 x daily - 7 x weekly - 3 reps - 30 sec hold  ASSESSMENT:  CLINICAL IMPRESSION: Reviewed goals and POC moving forward.  Pt overall better without pain in several weeks.  Pt able to demonstrate HEP correctly with little cues needed for form.  Extreme tightness in hamstrings with instruction of stretch in supine and seated.  Seated was easier for pt and given this  stretch to add to HEP.  Progressed on with LE strength and stabilization activities.  These were also added to HEP.   Pt without any pain or issues during or at completion of session today.  Patient will continue to benefit from skilled physical therapy for decreased low back/LE pain, increased activity tolerance, increased LE/core strength, and balance for improved gait quality, return to higher  level of function with ADLs, and progress towards therapy goals.   OBJECTIVE IMPAIRMENTS: Abnormal gait, decreased activity tolerance, decreased balance, decreased endurance, decreased mobility, difficulty walking, decreased ROM, decreased strength, impaired sensation, postural dysfunction, and pain.   ACTIVITY LIMITATIONS: carrying, lifting, bending, sitting, standing, squatting, stairs, transfers, and bed mobility  PARTICIPATION LIMITATIONS: meal prep, cleaning, laundry, driving, shopping, community activity, and yard work  PERSONAL FACTORS: Age, Fitness, Time since onset of injury/illness/exacerbation, and 1 comorbidity: hx of back surgery are also affecting patient's functional outcome.   REHAB POTENTIAL: Fair hx of low back surgery and sedentary lifestyle  CLINICAL DECISION MAKING: Stable/uncomplicated  EVALUATION COMPLEXITY: Low   GOALS: Goals reviewed with patient? No  SHORT TERM GOALS: Target date: 10/24/23  Pt will be independent with HEP in order to demonstrate participation in Physical Therapy POC.  Baseline: Goal status: INITIAL  2.  Pt will report 1/10 pain with mobility in order to demonstrate improved pain with ADLs.  Baseline: 3/10 Goal status: INITIAL  LONG TERM GOALS: Target date: 11/15/23  Pt will improve 5TSTS by at least 2.3 seconds in order to demonstrate improved functional strength to return to desired activities.  Baseline: see objective.  Goal status: INITIAL  2.  Pt will improve 2 MWT by 50 feet in order to demonstrate improved functional ambulatory capacity in community setting.  Baseline: see objective.  Goal status: INITIAL  3.  Pt will improve LEFS score by 9 points in order to demonstrate improved pain with functional goals and outcomes. Baseline: see objective.  Goal status: INITIAL  4.  Pt will report 1/10 pain in right quadriceps region at worst with mobility in order to demonstrate reduced pain with ADLs lasting greater than 30 minutes.   Baseline: see objective.  Goal status: INITIAL   PLAN:  PT FREQUENCY: 1-2x/week  PT DURATION: 6 weeks  PLANNED INTERVENTIONS: 97110-Therapeutic exercises, 97530- Therapeutic activity, 97112- Neuromuscular re-education, 97535- Self Care, 02859- Manual therapy, [phone]- Gait training, Patient/Family education, Balance training, Stair training, Joint mobilization, Spinal mobilization, Vestibular training, DME instructions, Cryotherapy, and Moist heat.  PLAN FOR NEXT SESSION: Progress core/LE strengthening, incorporate balance and progress functional transfer capabilities with floor to standing transfer   Greig KATHEE Fuse, PTA/CLT [facility] Ph: [phone] 12:35 PM, 10/14/23

## 2023-10-15 DIAGNOSIS — N390 Urinary tract infection, site not specified: Secondary | ICD-10-CM | POA: Diagnosis not present

## 2023-10-15 DIAGNOSIS — R399 Unspecified symptoms and signs involving the genitourinary system: Secondary | ICD-10-CM | POA: Diagnosis not present

## 2023-10-17 ENCOUNTER — Encounter (HOSPITAL_COMMUNITY)

## 2023-10-18 ENCOUNTER — Ambulatory Visit: Admitting: Podiatry

## 2023-10-23 ENCOUNTER — Encounter (HOSPITAL_COMMUNITY): Admitting: Physical Therapy

## 2023-10-23 ENCOUNTER — Telehealth (HOSPITAL_COMMUNITY): Payer: Self-pay | Admitting: Physical Therapy

## 2023-10-23 NOTE — Telephone Encounter (Signed)
 Pt did not show for appt.  Called and left VM regarding missed appt and reminder for next one scheduled tomorrow.   Greig KATHEE Fuse, PTA/CLT [facility] Ph: [phone]

## 2023-10-23 NOTE — Therapy (Signed)
 [facility] Outpatient Rehabilitation at [facility] [address] [place], [place], [address] Phone: [phone]   Fax:  [phone]  Patient Details  Name: Stephanie Norris MRN: [id number] Date of Birth: February 15, 1955 Referring Provider:  Shona Norleen PEDLAR, MD  Encounter Date: 10/24/2023  PHYSICAL THERAPY DISCHARGE SUMMARY  Visits from Start of Care: 1  Current functional level related to goals / functional outcomes: Pt called wanting to cancel all therapy appointments, has not returned since last visit.   Remaining deficits: Please see last treatment note, has not returned since last visit.   Education / Equipment: Please see last treatment note, has not returned since last visit.     Patient agrees to discharge. Patient goals were not met. Patient is being discharged due to the patient's request.   Lang Ada, PT, DPT [facility] Office: [phone] 5:57 PM, 10/23/23   [facility] Outpatient Rehabilitation at [facility] [address] [place], [place], [address] Phone: [phone]   Fax:  [phone]

## 2023-10-24 ENCOUNTER — Ambulatory Visit (HOSPITAL_COMMUNITY)

## 2023-10-24 DIAGNOSIS — R350 Frequency of micturition: Secondary | ICD-10-CM | POA: Diagnosis not present

## 2023-10-24 DIAGNOSIS — E119 Type 2 diabetes mellitus without complications: Secondary | ICD-10-CM | POA: Diagnosis not present

## 2023-10-24 DIAGNOSIS — E7800 Pure hypercholesterolemia, unspecified: Secondary | ICD-10-CM | POA: Diagnosis not present

## 2023-10-24 DIAGNOSIS — G609 Hereditary and idiopathic neuropathy, unspecified: Secondary | ICD-10-CM | POA: Diagnosis not present

## 2023-10-24 DIAGNOSIS — Z23 Encounter for immunization: Secondary | ICD-10-CM | POA: Diagnosis not present

## 2023-10-28 ENCOUNTER — Encounter (HOSPITAL_COMMUNITY): Admitting: Physical Therapy

## 2023-10-28 DIAGNOSIS — M6740 Ganglion, unspecified site: Secondary | ICD-10-CM | POA: Diagnosis not present

## 2023-10-28 DIAGNOSIS — M19042 Primary osteoarthritis, left hand: Secondary | ICD-10-CM | POA: Diagnosis not present

## 2023-10-28 DIAGNOSIS — M67442 Ganglion, left hand: Secondary | ICD-10-CM | POA: Diagnosis not present

## 2023-10-29 ENCOUNTER — Ambulatory Visit: Admitting: Rheumatology

## 2023-10-29 DIAGNOSIS — M0550 Rheumatoid polyneuropathy with rheumatoid arthritis of unspecified site: Secondary | ICD-10-CM | POA: Diagnosis not present

## 2023-10-29 DIAGNOSIS — E785 Hyperlipidemia, unspecified: Secondary | ICD-10-CM | POA: Diagnosis not present

## 2023-10-29 DIAGNOSIS — Z8669 Personal history of other diseases of the nervous system and sense organs: Secondary | ICD-10-CM

## 2023-10-29 DIAGNOSIS — M0579 Rheumatoid arthritis with rheumatoid factor of multiple sites without organ or systems involvement: Secondary | ICD-10-CM

## 2023-10-29 DIAGNOSIS — Z79899 Other long term (current) drug therapy: Secondary | ICD-10-CM

## 2023-10-29 DIAGNOSIS — Z8639 Personal history of other endocrine, nutritional and metabolic disease: Secondary | ICD-10-CM

## 2023-10-29 DIAGNOSIS — R5383 Other fatigue: Secondary | ICD-10-CM

## 2023-10-29 DIAGNOSIS — M19041 Primary osteoarthritis, right hand: Secondary | ICD-10-CM

## 2023-10-29 DIAGNOSIS — M8589 Other specified disorders of bone density and structure, multiple sites: Secondary | ICD-10-CM

## 2023-10-29 DIAGNOSIS — G8929 Other chronic pain: Secondary | ICD-10-CM | POA: Diagnosis not present

## 2023-10-29 DIAGNOSIS — E1169 Type 2 diabetes mellitus with other specified complication: Secondary | ICD-10-CM | POA: Diagnosis not present

## 2023-10-29 DIAGNOSIS — Z794 Long term (current) use of insulin: Secondary | ICD-10-CM | POA: Diagnosis not present

## 2023-10-29 DIAGNOSIS — E1142 Type 2 diabetes mellitus with diabetic polyneuropathy: Secondary | ICD-10-CM | POA: Diagnosis not present

## 2023-10-29 DIAGNOSIS — M7062 Trochanteric bursitis, left hip: Secondary | ICD-10-CM

## 2023-10-29 DIAGNOSIS — M5030 Other cervical disc degeneration, unspecified cervical region: Secondary | ICD-10-CM

## 2023-10-29 DIAGNOSIS — Z8659 Personal history of other mental and behavioral disorders: Secondary | ICD-10-CM

## 2023-10-29 DIAGNOSIS — M797 Fibromyalgia: Secondary | ICD-10-CM

## 2023-10-29 DIAGNOSIS — I10 Essential (primary) hypertension: Secondary | ICD-10-CM | POA: Diagnosis not present

## 2023-10-29 DIAGNOSIS — M47816 Spondylosis without myelopathy or radiculopathy, lumbar region: Secondary | ICD-10-CM

## 2023-10-29 DIAGNOSIS — Z8709 Personal history of other diseases of the respiratory system: Secondary | ICD-10-CM

## 2023-10-29 DIAGNOSIS — M19071 Primary osteoarthritis, right ankle and foot: Secondary | ICD-10-CM

## 2023-10-29 DIAGNOSIS — F331 Major depressive disorder, recurrent, moderate: Secondary | ICD-10-CM | POA: Diagnosis not present

## 2023-10-29 DIAGNOSIS — D8481 Immunodeficiency due to conditions classified elsewhere: Secondary | ICD-10-CM | POA: Diagnosis not present

## 2023-10-29 DIAGNOSIS — E1165 Type 2 diabetes mellitus with hyperglycemia: Secondary | ICD-10-CM | POA: Diagnosis not present

## 2023-10-29 DIAGNOSIS — M62838 Other muscle spasm: Secondary | ICD-10-CM

## 2023-10-29 DIAGNOSIS — G4700 Insomnia, unspecified: Secondary | ICD-10-CM | POA: Diagnosis not present

## 2023-10-29 DIAGNOSIS — Z1589 Genetic susceptibility to other disease: Secondary | ICD-10-CM

## 2023-10-29 DIAGNOSIS — J309 Allergic rhinitis, unspecified: Secondary | ICD-10-CM | POA: Diagnosis not present

## 2023-10-30 ENCOUNTER — Encounter (HOSPITAL_COMMUNITY): Admitting: Physical Therapy

## 2023-11-04 ENCOUNTER — Encounter (HOSPITAL_COMMUNITY): Admitting: Physical Therapy

## 2023-11-05 DIAGNOSIS — N3946 Mixed incontinence: Secondary | ICD-10-CM | POA: Diagnosis not present

## 2023-11-07 ENCOUNTER — Encounter (HOSPITAL_COMMUNITY)

## 2023-11-11 ENCOUNTER — Encounter (HOSPITAL_COMMUNITY): Admitting: Physical Therapy

## 2023-11-13 ENCOUNTER — Encounter (HOSPITAL_COMMUNITY)

## 2023-11-14 DIAGNOSIS — E7800 Pure hypercholesterolemia, unspecified: Secondary | ICD-10-CM | POA: Diagnosis not present

## 2023-11-14 DIAGNOSIS — E119 Type 2 diabetes mellitus without complications: Secondary | ICD-10-CM | POA: Diagnosis not present

## 2023-11-18 NOTE — Progress Notes (Deleted)
 Office Visit Note  Patient: Stephanie Norris             Date of Birth: 10/03/1955           MRN: [id number]             PCP: Shona Norleen PEDLAR, MD Referring: Shona Norleen PEDLAR, MD Visit Date: 12/02/2023 Occupation: Data Unavailable  Subjective:  No chief complaint on file.   History of Present Illness: Stephanie Norris is a 68 y.o. female ***     Activities of Daily Living:  Patient reports morning stiffness for *** {minute/hour:19697}.   Patient {ACTIONS;DENIES/REPORTS:21021675::Denies} nocturnal pain.  Difficulty dressing/grooming: {ACTIONS;DENIES/REPORTS:21021675::Denies} Difficulty climbing stairs: {ACTIONS;DENIES/REPORTS:21021675::Denies} Difficulty getting out of chair: {ACTIONS;DENIES/REPORTS:21021675::Denies} Difficulty using hands for taps, buttons, cutlery, and/or writing: {ACTIONS;DENIES/REPORTS:21021675::Denies}  No Rheumatology ROS completed.   PMFS History:  Patient Active Problem List   Diagnosis Date Noted   Chronic rhinitis 05/07/2022   Sore in nostril 05/07/2022   Mucous cyst of digit of left hand 04/07/2020   Vertigo 08/20/2016   Eustachian tube dysfunction 04/17/2016   Abdominal pain 03/02/2016   Gastroenteritis 03/02/2016   Rheumatoid arthritis with rheumatoid factor of multiple sites without organ or systems involvement (HCC) 01/13/2016   HLA B27 (HLA B27 positive) 01/13/2016   Osteoarthritis of foot 01/13/2016   DJD (degenerative joint disease), cervical 01/13/2016   Spondylosis of lumbar region without myelopathy or radiculopathy 01/13/2016   Primary osteoarthritis of both hands 01/13/2016   Non-seasonal allergic rhinitis 08/13/2014   Stiffness of joints, not elsewhere classified, multiple sites 08/26/2013   Flu-like symptoms 03/02/2013   Lactic acidosis 03/02/2013   Sinus tachycardia 03/02/2013   Elevated lactic acid level 03/02/2013   Influenza due to identified novel influenza A virus with other respiratory manifestations 03/02/2013    Hyperglycemia 03/02/2013   Fibromyalgia    OSA (obstructive sleep apnea) 06/22/2011   DOE (dyspnea on exertion) 05/30/2011   Not well controlled moderate persistent asthma 03/30/2011    Past Medical History:  Diagnosis Date   Asthma    COPD (chronic obstructive pulmonary disease) (HCC)    Diabetes mellitus without complication (HCC)    Type 2   Dizziness    Fibromyalgia    GERD (gastroesophageal reflux disease)    Headache    Hyperlipidemia    OSA (obstructive sleep apnea)    Osteoarthritis    PCOS (polycystic ovarian syndrome)    Raynaud disease    Rheumatoid arthritis (HCC)     Family History  Problem Relation Age of Onset   Asthma Mother    Rheum arthritis Mother    Allergic rhinitis Mother    Rheum arthritis Sister    Allergic rhinitis Sister    Heart failure Father    Heart disease Father    Rheum arthritis Sister    Allergic rhinitis Sister    Allergic rhinitis Brother    Allergies Other        Everyone   Heart disease Maternal Grandfather    Heart disease Maternal Grandmother    Heart disease Paternal Grandfather    Heart disease Paternal Grandmother    Past Surgical History:  Procedure Laterality Date   APPENDECTOMY     BACK SURGERY  2021   CHOLECYSTECTOMY     COLONOSCOPY W/ BIOPSIES  fall 2009   COMBINED HYSTERECTOMY VAGINAL / OOPHORECTOMY / A&P REPAIR  10/2002   endometriosis, cystocele, fibroids   FINGER SURGERY Left 05/09/2020   spurs removed from left index finger   KNEE  SURGERY  1996   TOE SURGERY     VESICOVAGINAL FISTULA CLOSURE W/ TAH     Social History   Tobacco Use   Smoking status: Never    Passive exposure: Never   Smokeless tobacco: Never  Vaping Use   Vaping status: Never Used  Substance Use Topics   Alcohol use: No   Drug use: No   Social History   Social History Narrative   Lives with husband   Retired    no children   Assoc degree   16 oz caffeine daily     Immunization History  Administered Date(s)  Administered   Influenza Whole 10/07/2010, 11/06/2011   Influenza,inj,Quad PF,6+ Mos 11/05/2012, 11/02/2015, 11/22/2016, 11/15/2017   Influenza-Unspecified 11/05/2013   Moderna Sars-Covid-2 Vaccination 04/13/2019, 05/11/2019   Pneumococcal Conjugate-13 11/06/2014   Pneumococcal Polysaccharide-23 11/02/2015     Objective: Vital Signs: There were no vitals taken for this visit.   Physical Exam   Musculoskeletal Exam: ***  CDAI Exam: CDAI Score: -- Patient Global: --; Provider Global: -- Swollen: --; Tender: -- Joint Exam 12/02/2023   No joint exam has been documented for this visit   There is currently no information documented on the homunculus. Go to the Rheumatology activity and complete the homunculus joint exam.  Investigation: No additional findings.  Imaging: No results found.  Recent Labs: Lab Results  Component Value Date   WBC 12.2 (H) 05/13/2023   HGB 13.9 05/13/2023   PLT 406 (H) 05/13/2023   NA 139 05/13/2023   K 4.4 05/13/2023   CL 106 05/13/2023   CO2 22 05/13/2023   GLUCOSE 223 (H) 05/13/2023   BUN 26 (H) 05/13/2023   CREATININE 0.69 05/13/2023   BILITOT 0.3 05/13/2023   ALKPHOS 54 09/10/2016   AST 14 05/13/2023   ALT 22 05/13/2023   PROT 6.8 05/13/2023   ALBUMIN 4.0 09/10/2016   CALCIUM 9.8 05/13/2023   GFRAA 96 07/26/2020    Speciality Comments: No specialty comments available.  Procedures:  No procedures performed Allergies: Nsaids, Bactrim [sulfamethoxazole -trimethoprim], Cephalexin , and Levofloxacin   Assessment / Plan:     Visit Diagnoses: Rheumatoid arthritis with rheumatoid factor of multiple sites without organ or systems involvement (HCC)  High risk medication use  HLA B27 (HLA B27 positive)  Primary osteoarthritis of both hands  Trochanteric bursitis of left hip  Primary osteoarthritis of both feet  Chronic SI joint pain  DDD (degenerative disc disease), cervical  Spondylosis of lumbar  spine  Fibromyalgia  Trapezius muscle spasm  Other fatigue  Osteopenia of multiple sites  History of diabetes mellitus  History of depression  History of sleep apnea  History of asthma  Orders: No orders of the defined types were placed in this encounter.  No orders of the defined types were placed in this encounter.   Face-to-face time spent with patient was *** minutes. Greater than 50% of time was spent in counseling and coordination of care.  Follow-Up Instructions: No follow-ups on file.   Waddell CHRISTELLA Craze, PA-C  Note - This record has been created using Dragon software.  Chart creation errors have been sought, but may not always  have been located. Such creation errors do not reflect on  the standard of medical care.

## 2023-11-19 ENCOUNTER — Encounter (HOSPITAL_COMMUNITY)

## 2023-11-20 ENCOUNTER — Ambulatory Visit: Admitting: Physician Assistant

## 2023-11-21 ENCOUNTER — Encounter (HOSPITAL_COMMUNITY)

## 2023-11-22 ENCOUNTER — Ambulatory Visit: Admitting: Allergy & Immunology

## 2023-12-02 ENCOUNTER — Ambulatory Visit: Admitting: Physician Assistant

## 2023-12-02 DIAGNOSIS — M7062 Trochanteric bursitis, left hip: Secondary | ICD-10-CM

## 2023-12-02 DIAGNOSIS — Z79899 Other long term (current) drug therapy: Secondary | ICD-10-CM

## 2023-12-02 DIAGNOSIS — Z1589 Genetic susceptibility to other disease: Secondary | ICD-10-CM

## 2023-12-02 DIAGNOSIS — M19041 Primary osteoarthritis, right hand: Secondary | ICD-10-CM

## 2023-12-02 DIAGNOSIS — M19071 Primary osteoarthritis, right ankle and foot: Secondary | ICD-10-CM

## 2023-12-02 DIAGNOSIS — M0579 Rheumatoid arthritis with rheumatoid factor of multiple sites without organ or systems involvement: Secondary | ICD-10-CM

## 2023-12-02 DIAGNOSIS — M8589 Other specified disorders of bone density and structure, multiple sites: Secondary | ICD-10-CM

## 2023-12-02 DIAGNOSIS — Z8669 Personal history of other diseases of the nervous system and sense organs: Secondary | ICD-10-CM

## 2023-12-02 DIAGNOSIS — R5383 Other fatigue: Secondary | ICD-10-CM

## 2023-12-02 DIAGNOSIS — M62838 Other muscle spasm: Secondary | ICD-10-CM

## 2023-12-02 DIAGNOSIS — G8929 Other chronic pain: Secondary | ICD-10-CM

## 2023-12-02 DIAGNOSIS — Z8709 Personal history of other diseases of the respiratory system: Secondary | ICD-10-CM

## 2023-12-02 DIAGNOSIS — Z8659 Personal history of other mental and behavioral disorders: Secondary | ICD-10-CM

## 2023-12-02 DIAGNOSIS — Z8639 Personal history of other endocrine, nutritional and metabolic disease: Secondary | ICD-10-CM

## 2023-12-02 DIAGNOSIS — H53483 Generalized contraction of visual field, bilateral: Secondary | ICD-10-CM | POA: Diagnosis not present

## 2023-12-02 DIAGNOSIS — M5030 Other cervical disc degeneration, unspecified cervical region: Secondary | ICD-10-CM

## 2023-12-02 DIAGNOSIS — M797 Fibromyalgia: Secondary | ICD-10-CM

## 2023-12-02 DIAGNOSIS — M47816 Spondylosis without myelopathy or radiculopathy, lumbar region: Secondary | ICD-10-CM

## 2023-12-06 DIAGNOSIS — E119 Type 2 diabetes mellitus without complications: Secondary | ICD-10-CM | POA: Diagnosis not present

## 2023-12-06 DIAGNOSIS — G609 Hereditary and idiopathic neuropathy, unspecified: Secondary | ICD-10-CM | POA: Diagnosis not present

## 2023-12-06 DIAGNOSIS — E7800 Pure hypercholesterolemia, unspecified: Secondary | ICD-10-CM | POA: Diagnosis not present

## 2023-12-12 DIAGNOSIS — I10 Essential (primary) hypertension: Secondary | ICD-10-CM | POA: Diagnosis not present

## 2023-12-12 DIAGNOSIS — E039 Hypothyroidism, unspecified: Secondary | ICD-10-CM | POA: Diagnosis not present

## 2023-12-12 DIAGNOSIS — E1140 Type 2 diabetes mellitus with diabetic neuropathy, unspecified: Secondary | ICD-10-CM | POA: Diagnosis not present

## 2023-12-13 ENCOUNTER — Other Ambulatory Visit: Payer: Self-pay | Admitting: Rheumatology

## 2023-12-13 DIAGNOSIS — Z79899 Other long term (current) drug therapy: Secondary | ICD-10-CM

## 2023-12-13 MED ORDER — METHOTREXATE SODIUM 2.5 MG PO TABS
12.5000 mg | ORAL_TABLET | ORAL | 0 refills | Status: AC
Start: 2023-12-13 — End: ?

## 2023-12-13 NOTE — Telephone Encounter (Signed)
 Attempted to contact patient and left message to advise the patient to call the office.    Okay to recheck BMP w/ GFR at follow up on 12/30/2023 per Waddell. Pended BMP w/GFR.

## 2023-12-13 NOTE — Telephone Encounter (Signed)
 Last Fill: 05/13/2023  Labs: 12/12/2023 platelets 457, glucose 152, BUN 31, creatinine 1.04, GFR 58, BUN/creatinine ratio 30, alkaline phosphatase 38, ALT 37 Triglycerides 235, HDL 19,  T. Chol/HDL Ratio 7.0 A1c 7.2 (Labs are in [facility] tab)   Next Visit: 12/30/2023  Last Visit: 05/13/2023  DX: Rheumatoid arthritis with rheumatoid factor of multiple sites without organ or systems involvement   Current Dose per office note on 05/13/2023: Methotrexate  6 tablets by mouth every 7 days   Okay to refill Methotrexate ?

## 2023-12-13 NOTE — Telephone Encounter (Signed)
 Recommend reducing the dose of methotrexate  to 5 tablets weekly due to elevation of creatinine.  Recheck BMP with GFR in 2-3 weeks.

## 2023-12-13 NOTE — Telephone Encounter (Signed)
 Advised patient that per Waddell: Recommend reducing the dose of methotrexate  to 5 tablets weekly due to elevation of creatinine. Recheck BMP with GFR in 2-3 weeks.   Patient verbalized understanding.

## 2023-12-13 NOTE — Telephone Encounter (Signed)
 Patient contacted the office to request a medication refill.   1. Name of Medication: Methotrexate   2. How are you currently taking this medication (dosage and times per day)? 6 tablets (15 mg) once a week   3. What pharmacy would you like for that to be sent to? [facility] in [place].

## 2023-12-16 NOTE — Progress Notes (Unsigned)
 Office Visit Note  Patient: Stephanie Norris             Date of Birth: 10/03/1955           MRN: [id number]             PCP: Shona Norleen PEDLAR, MD Referring: Shona Norleen PEDLAR, MD Visit Date: 12/30/2023 Occupation: Data Unavailable  Subjective:  Pain in both hands   History of Present Illness: Stephanie Norris is a 68 y.o. female with history of seropositive rheumatoid arthritis and osteoarthritis.  Patient is currently prescribed methotrexate  5 tablets by mouth every 7 days and folic acid  1 mg 2 tablets daily.  The dose of methotrexate  was reduced from 6 tablets to 5 tablets weekly due to an elevation of creatinine.  Patient states that she was recently sick with an upper respiratory tract infection at which time she held methotrexate  for 2 weeks.  Patient states that while sick her diabetes was also not well-controlled.  She is been experiencing increased pain and stiffness in both hands which he attributes to the gap in therapy.  She is also been experiencing increased generalized arthralgias and myalgias which she attributes to weather change.  She has been using Voltaren  gel topically as needed for pain relief.  She denies any nocturnal pain. She denies any new medical conditions.  Activities of Daily Living:  Patient reports morning stiffness for 0 minute.   Patient Denies nocturnal pain.  Difficulty dressing/grooming: Denies Difficulty climbing stairs: Reports Difficulty getting out of chair: Denies Difficulty using hands for taps, buttons, cutlery, and/or writing: Reports  Review of Systems  Constitutional:  Positive for fatigue.  HENT:  Positive for mouth dryness. Negative for mouth sores.   Eyes:  Positive for dryness.  Respiratory:  Negative for shortness of breath.   Cardiovascular:  Negative for chest pain and palpitations.  Gastrointestinal:  Positive for constipation. Negative for blood in stool and diarrhea.  Endocrine: Negative for increased urination.  Genitourinary:  Negative  for involuntary urination.  Musculoskeletal:  Positive for joint pain, gait problem, joint pain, joint swelling, myalgias and myalgias. Negative for muscle weakness, morning stiffness and muscle tenderness.  Skin:  Negative for color change, rash, hair loss and sensitivity to sunlight.  Allergic/Immunologic: Positive for susceptible to infections.  Neurological:  Positive for headaches. Negative for dizziness.  Hematological:  Negative for swollen glands.  Psychiatric/Behavioral:  Positive for depressed mood. Negative for sleep disturbance. The patient is not nervous/anxious.     PMFS History:  Patient Active Problem List   Diagnosis Date Noted   Chronic rhinitis 05/07/2022   Sore in nostril 05/07/2022   Mucous cyst of digit of left hand 04/07/2020   Vertigo 08/20/2016   Eustachian tube dysfunction 04/17/2016   Abdominal pain 03/02/2016   Gastroenteritis 03/02/2016   Rheumatoid arthritis with rheumatoid factor of multiple sites without organ or systems involvement (HCC) 01/13/2016   HLA B27 (HLA B27 positive) 01/13/2016   Osteoarthritis of foot 01/13/2016   DJD (degenerative joint disease), cervical 01/13/2016   Spondylosis of lumbar region without myelopathy or radiculopathy 01/13/2016   Primary osteoarthritis of both hands 01/13/2016   Non-seasonal allergic rhinitis 08/13/2014   Stiffness of joints, not elsewhere classified, multiple sites 08/26/2013   Flu-like symptoms 03/02/2013   Lactic acidosis 03/02/2013   Sinus tachycardia 03/02/2013   Elevated lactic acid level 03/02/2013   Influenza due to identified novel influenza A virus with other respiratory manifestations 03/02/2013   Hyperglycemia 03/02/2013  Fibromyalgia    OSA (obstructive sleep apnea) 06/22/2011   DOE (dyspnea on exertion) 05/30/2011   Not well controlled moderate persistent asthma 03/30/2011    Past Medical History:  Diagnosis Date   Asthma    COPD (chronic obstructive pulmonary disease) (HCC)     Diabetes mellitus without complication (HCC)    Type 2   Dizziness    Fibromyalgia    GERD (gastroesophageal reflux disease)    Headache    Hyperlipidemia    OSA (obstructive sleep apnea)    Osteoarthritis    PCOS (polycystic ovarian syndrome)    Raynaud disease    Rheumatoid arthritis (HCC)     Family History  Problem Relation Age of Onset   Asthma Mother    Rheum arthritis Mother    Allergic rhinitis Mother    Rheum arthritis Sister    Allergic rhinitis Sister    Heart failure Father    Heart disease Father    Rheum arthritis Sister    Allergic rhinitis Sister    Allergic rhinitis Brother    Allergies Other        Everyone   Heart disease Maternal Grandfather    Heart disease Maternal Grandmother    Heart disease Paternal Grandfather    Heart disease Paternal Grandmother    Past Surgical History:  Procedure Laterality Date   APPENDECTOMY     BACK SURGERY  2021   CHOLECYSTECTOMY     COLONOSCOPY W/ BIOPSIES  fall 2009   COMBINED HYSTERECTOMY VAGINAL / OOPHORECTOMY / A&P REPAIR  10/2002   endometriosis, cystocele, fibroids   FINGER SURGERY Left 05/09/2020   spurs removed from left index finger   KNEE SURGERY  1996   TOE SURGERY     VESICOVAGINAL FISTULA CLOSURE W/ TAH     Social History   Tobacco Use   Smoking status: Never    Passive exposure: Never   Smokeless tobacco: Never  Vaping Use   Vaping status: Never Used  Substance Use Topics   Alcohol use: No   Drug use: No   Social History   Social History Narrative   Lives with husband   Retired    no children   Assoc degree   16 oz caffeine daily     Immunization History  Administered Date(s) Administered   Influenza Whole 10/07/2010, 11/06/2011   Influenza,inj,Quad PF,6+ Mos 11/05/2012, 11/02/2015, 11/22/2016, 11/15/2017   Influenza-Unspecified 11/05/2013   Moderna Sars-Covid-2 Vaccination 04/13/2019, 05/11/2019   Pneumococcal Conjugate-13 11/06/2014   Pneumococcal Polysaccharide-23  11/02/2015     Objective: Vital Signs: BP 117/78   Pulse 70   Temp (!) 97 F (36.1 C)   Resp 14   Ht 4' 11.5 (1.511 m)   Wt 114 lb (51.7 kg)   BMI 22.64 kg/m    Physical Exam Vitals and nursing note reviewed.  Constitutional:      Appearance: She is well-developed.  HENT:     Head: Normocephalic and atraumatic.  Eyes:     Conjunctiva/sclera: Conjunctivae normal.  Cardiovascular:     Rate and Rhythm: Normal rate and regular rhythm.     Heart sounds: Normal heart sounds.  Pulmonary:     Effort: Pulmonary effort is normal.     Breath sounds: Normal breath sounds.  Abdominal:     General: Bowel sounds are normal.     Palpations: Abdomen is soft.  Musculoskeletal:     Cervical back: Normal range of motion.  Lymphadenopathy:     Cervical: No cervical  adenopathy.  Skin:    General: Skin is warm and dry.     Capillary Refill: Capillary refill takes less than 2 seconds.  Neurological:     Mental Status: She is alert and oriented to person, place, and time.  Psychiatric:        Behavior: Behavior normal.      Musculoskeletal Exam: Generalized hyperalgesia and positive tender points on exam.  C-spine has limited range of motion with lateral rotation.  Shoulder joints, elbow joints, wrist joints have good range of motion. PIP and DIP thickening consistent with osteoarthritis of both hands. Tenderness of the right 2nd and 3rd MCP joints. Right hip has slightly limited range of motion.  Knee joints have good range of motion no warmth or effusion.  Ankle joints have good range of motion with no tenderness or joint swelling.  CDAI Exam: CDAI Score: -- Patient Global: --; Provider Global: -- Swollen: --; Tender: -- Joint Exam 12/30/2023   No joint exam has been documented for this visit   There is currently no information documented on the homunculus. Go to the Rheumatology activity and complete the homunculus joint exam.  Investigation: No additional  findings.  Imaging: No results found.  Recent Labs: Lab Results  Component Value Date   WBC 12.2 (H) 05/13/2023   HGB 13.9 05/13/2023   PLT 406 (H) 05/13/2023   NA 139 05/13/2023   K 4.4 05/13/2023   CL 106 05/13/2023   CO2 22 05/13/2023   GLUCOSE 223 (H) 05/13/2023   BUN 26 (H) 05/13/2023   CREATININE 0.69 05/13/2023   BILITOT 0.3 05/13/2023   ALKPHOS 54 09/10/2016   AST 14 05/13/2023   ALT 22 05/13/2023   PROT 6.8 05/13/2023   ALBUMIN 4.0 09/10/2016   CALCIUM 9.8 05/13/2023   GFRAA 96 07/26/2020    Speciality Comments: No specialty comments available.  Procedures:  No procedures performed Allergies: Nsaids, Bactrim [sulfamethoxazole -trimethoprim], Cephalexin , and Levofloxacin   Assessment / Plan:     Visit Diagnoses: Rheumatoid arthritis with rheumatoid factor of multiple sites without organ or systems involvement (HCC) - +CCP: She has no synovitis on examination today.  She presents today with increased pain and stiffness in both hands which he attributes to recently holding her dose of methotrexate  for 2 weeks.  She was recently recovering from an upper respiratory tract infection.  She resumed methotrexate  this past weekend.  The dose of methotrexate  has been reduced from 6 tablets to 5 tablets weekly due to elevation of creatinine and low GFR --plan to recheck BMP with GFR today.  She was advised to notify us  if she develops any signs or symptoms of a flare.  She will follow-up in the office in 5 months or sooner if needed.  High risk medication use - Methotrexate  5 tablets by mouth every 7 days and folic acid  1 mg 2 tablets daily CBC and CMP updated on 12/12/2023-creatinine 1.04 and GFR 58--reduced the dose of methotrexate  from 6 tablets to 5 tablets weekly.  Plan to recheck BMP with GFR today. Lipid panel obtained on 12/12/2023 No recurrent infections.  Discussed the importance of holding methotrexate  if she develops signs or symptoms of an infection and to resume once  the infection has completely cleared.   - Plan: Basic metabolic panel with GFR, Elevated serum creatinine -Creatinine was 1.04 and GFR was 58 on 12/12/2023.  BMP with GFR updated today.  Plan:Basic Metabolic Panel (BMET)  HLA B27 (HLA B27 positive)  Primary osteoarthritis of both hands:  PIP and DIP thickening consistent with osteoarthritis of both hands.  No synovitis noted.  Complete fist formation bilaterally.  She presents today with increased pain and stiffness in both hands which she attributes to recently having a gap in therapy.  She has since resumed methotrexate .  Trochanteric bursitis of left hip: Intermittent discomfort.  Primary osteoarthritis of both feet: She has good range of motion of both ankle joints with no tenderness or joint swelling.  Chronic SI joint pain - Left SI joint injected on 11/22/21  DDD (degenerative disc disease), cervical: C-spine has limited range of motion with lateral rotation.  Spondylosis of lumbar spine - Discectomy performed April 2021 by Dr. Mavis.  No symptoms of radiculopathy at this time.  Fibromyalgia - She has generalized hyperalgesia and positive tender points on exam.  She is currently having increased myalgias, arthralgias, and joint stiffness.  She remains on Cymbalta  60 mg 2 capsules daily and has been using Voltaren  gel topically as needed for pain relief.  Trapezius muscle spasm: She has trapezius muscle tension and tenderness bilaterally.  Other fatigue: Chronic, stable.   Osteopenia of multiple sites - 12/24/16 DEXA scan showed T score of -2.3, BMD 0.596 in the left femoral neck.  Patient has not had an updated bone density. DEXA order has been placed by Dr. Shona.    Other medical conditions are listed as follows:   History of diabetes mellitus  History of depression  History of sleep apnea  History of asthma   Orders: No orders of the defined types were placed in this encounter.  No orders of the defined types were  placed in this encounter.    Follow-Up Instructions: Return in about 5 months (around 05/29/2024) for Rheumatoid arthritis, Osteoarthritis.   Waddell CHRISTELLA Craze, PA-C  Note - This record has been created using Dragon software.  Chart creation errors have been sought, but may not always  have been located. Such creation errors do not reflect on  the standard of medical care.

## 2023-12-18 DIAGNOSIS — J069 Acute upper respiratory infection, unspecified: Secondary | ICD-10-CM | POA: Diagnosis not present

## 2023-12-18 DIAGNOSIS — E1165 Type 2 diabetes mellitus with hyperglycemia: Secondary | ICD-10-CM | POA: Diagnosis not present

## 2023-12-18 DIAGNOSIS — R030 Elevated blood-pressure reading, without diagnosis of hypertension: Secondary | ICD-10-CM | POA: Diagnosis not present

## 2023-12-23 ENCOUNTER — Other Ambulatory Visit (HOSPITAL_COMMUNITY): Payer: Self-pay | Admitting: Internal Medicine

## 2023-12-23 DIAGNOSIS — R7989 Other specified abnormal findings of blood chemistry: Secondary | ICD-10-CM | POA: Diagnosis not present

## 2023-12-23 DIAGNOSIS — N289 Disorder of kidney and ureter, unspecified: Secondary | ICD-10-CM | POA: Diagnosis not present

## 2023-12-23 DIAGNOSIS — Z0001 Encounter for general adult medical examination with abnormal findings: Secondary | ICD-10-CM | POA: Diagnosis not present

## 2023-12-23 DIAGNOSIS — E1140 Type 2 diabetes mellitus with diabetic neuropathy, unspecified: Secondary | ICD-10-CM | POA: Diagnosis not present

## 2023-12-23 DIAGNOSIS — J069 Acute upper respiratory infection, unspecified: Secondary | ICD-10-CM | POA: Diagnosis not present

## 2023-12-23 DIAGNOSIS — I10 Essential (primary) hypertension: Secondary | ICD-10-CM | POA: Diagnosis not present

## 2023-12-23 DIAGNOSIS — F331 Major depressive disorder, recurrent, moderate: Secondary | ICD-10-CM | POA: Diagnosis not present

## 2023-12-23 DIAGNOSIS — J4520 Mild intermittent asthma, uncomplicated: Secondary | ICD-10-CM | POA: Diagnosis not present

## 2023-12-23 DIAGNOSIS — Z0000 Encounter for general adult medical examination without abnormal findings: Secondary | ICD-10-CM | POA: Diagnosis not present

## 2023-12-23 DIAGNOSIS — E782 Mixed hyperlipidemia: Secondary | ICD-10-CM | POA: Diagnosis not present

## 2023-12-23 DIAGNOSIS — G4733 Obstructive sleep apnea (adult) (pediatric): Secondary | ICD-10-CM | POA: Diagnosis not present

## 2023-12-23 DIAGNOSIS — M0579 Rheumatoid arthritis with rheumatoid factor of multiple sites without organ or systems involvement: Secondary | ICD-10-CM | POA: Diagnosis not present

## 2023-12-23 DIAGNOSIS — M8580 Other specified disorders of bone density and structure, unspecified site: Secondary | ICD-10-CM

## 2023-12-30 ENCOUNTER — Encounter: Payer: Self-pay | Admitting: Physician Assistant

## 2023-12-30 ENCOUNTER — Ambulatory Visit: Attending: Physician Assistant | Admitting: Physician Assistant

## 2023-12-30 VITALS — BP 117/78 | HR 70 | Temp 97.0°F | Resp 14 | Ht 59.5 in | Wt 114.0 lb

## 2023-12-30 DIAGNOSIS — M19041 Primary osteoarthritis, right hand: Secondary | ICD-10-CM

## 2023-12-30 DIAGNOSIS — Z79899 Other long term (current) drug therapy: Secondary | ICD-10-CM | POA: Diagnosis not present

## 2023-12-30 DIAGNOSIS — M0579 Rheumatoid arthritis with rheumatoid factor of multiple sites without organ or systems involvement: Secondary | ICD-10-CM | POA: Diagnosis not present

## 2023-12-30 DIAGNOSIS — M19072 Primary osteoarthritis, left ankle and foot: Secondary | ICD-10-CM

## 2023-12-30 DIAGNOSIS — M62838 Other muscle spasm: Secondary | ICD-10-CM

## 2023-12-30 DIAGNOSIS — M7062 Trochanteric bursitis, left hip: Secondary | ICD-10-CM

## 2023-12-30 DIAGNOSIS — Z8639 Personal history of other endocrine, nutritional and metabolic disease: Secondary | ICD-10-CM

## 2023-12-30 DIAGNOSIS — M5030 Other cervical disc degeneration, unspecified cervical region: Secondary | ICD-10-CM | POA: Diagnosis not present

## 2023-12-30 DIAGNOSIS — R5383 Other fatigue: Secondary | ICD-10-CM

## 2023-12-30 DIAGNOSIS — Z8709 Personal history of other diseases of the respiratory system: Secondary | ICD-10-CM

## 2023-12-30 DIAGNOSIS — M797 Fibromyalgia: Secondary | ICD-10-CM | POA: Diagnosis not present

## 2023-12-30 DIAGNOSIS — M8589 Other specified disorders of bone density and structure, multiple sites: Secondary | ICD-10-CM

## 2023-12-30 DIAGNOSIS — M19071 Primary osteoarthritis, right ankle and foot: Secondary | ICD-10-CM

## 2023-12-30 DIAGNOSIS — M47816 Spondylosis without myelopathy or radiculopathy, lumbar region: Secondary | ICD-10-CM

## 2023-12-30 DIAGNOSIS — Z8659 Personal history of other mental and behavioral disorders: Secondary | ICD-10-CM

## 2023-12-30 DIAGNOSIS — Z1589 Genetic susceptibility to other disease: Secondary | ICD-10-CM

## 2023-12-30 DIAGNOSIS — R7989 Other specified abnormal findings of blood chemistry: Secondary | ICD-10-CM

## 2023-12-30 DIAGNOSIS — M533 Sacrococcygeal disorders, not elsewhere classified: Secondary | ICD-10-CM

## 2023-12-30 DIAGNOSIS — M19042 Primary osteoarthritis, left hand: Secondary | ICD-10-CM

## 2023-12-30 DIAGNOSIS — G8929 Other chronic pain: Secondary | ICD-10-CM

## 2023-12-30 DIAGNOSIS — Z8669 Personal history of other diseases of the nervous system and sense organs: Secondary | ICD-10-CM

## 2023-12-31 ENCOUNTER — Ambulatory Visit: Payer: Self-pay | Admitting: Physician Assistant

## 2023-12-31 DIAGNOSIS — Z79899 Other long term (current) drug therapy: Secondary | ICD-10-CM

## 2023-12-31 DIAGNOSIS — R7989 Other specified abnormal findings of blood chemistry: Secondary | ICD-10-CM

## 2023-12-31 LAB — BASIC METABOLIC PANEL WITH GFR
BUN/Creatinine Ratio: 27 (calc) — ABNORMAL HIGH (ref 6–22)
BUN: 29 mg/dL — ABNORMAL HIGH (ref 7–25)
CO2: 21 mmol/L (ref 20–32)
Calcium: 9.7 mg/dL (ref 8.6–10.4)
Chloride: 108 mmol/L (ref 98–110)
Creat: 1.07 mg/dL — ABNORMAL HIGH (ref 0.60–1.00)
Glucose, Bld: 152 mg/dL — ABNORMAL HIGH (ref 65–99)
Potassium: 4.6 mmol/L (ref 3.5–5.3)
Sodium: 139 mmol/L (ref 135–146)
eGFR: 56 mL/min/1.73m2 — ABNORMAL LOW (ref >=60)

## 2023-12-31 NOTE — Progress Notes (Signed)
 Glucose elevated-152. Creatinine is elevated and has trended up since 12/12/23.  GFR low-56-avoid all NSAIDs. Increase water intake. And remain on reduced dose of methotrexate .  Recommend continuing to monitor closely--recheck BMP with GFR in 3-4 weeks.

## 2024-01-22 ENCOUNTER — Emergency Department (HOSPITAL_COMMUNITY)
Admission: EM | Admit: 2024-01-22 | Discharge: 2024-01-22 | Disposition: A | Discharge status: Home / Self Care | Source: Home / Self Care | Attending: Emergency Medicine | Admitting: Emergency Medicine

## 2024-01-22 ENCOUNTER — Other Ambulatory Visit: Payer: Self-pay

## 2024-01-22 ENCOUNTER — Emergency Department (HOSPITAL_COMMUNITY)

## 2024-01-22 DIAGNOSIS — W108XXA Fall (on) (from) other stairs and steps, initial encounter: Secondary | ICD-10-CM | POA: Diagnosis not present

## 2024-01-22 DIAGNOSIS — S60229A Contusion of unspecified hand, initial encounter: Secondary | ICD-10-CM | POA: Insufficient documentation

## 2024-01-22 DIAGNOSIS — S8000XA Contusion of unspecified knee, initial encounter: Secondary | ICD-10-CM | POA: Diagnosis not present

## 2024-01-22 DIAGNOSIS — W19XXXA Unspecified fall, initial encounter: Secondary | ICD-10-CM

## 2024-01-22 LAB — CBG MONITORING, ED: Glucose-Capillary: 156 mg/dL — ABNORMAL HIGH (ref 70–99)

## 2024-01-22 MED ORDER — ACETAMINOPHEN 325 MG PO TABS
975.0000 mg | ORAL_TABLET | Freq: Once | ORAL | Status: AC
  Administered 2024-01-22: 21:00:00 975 mg via ORAL
  Filled 2024-01-22: qty 3

## 2024-01-22 NOTE — ED Triage Notes (Signed)
 Pt here as a visitor and had a mechanical fall going down stairs and missed last step, pt denies hitting head and no LOC. Pt landed on hands and knees, endorses 5/10 pain in bilateral hands and knees. No obvious injury or deformity. No thinners.  Pt states she was not dizzy when she fell but feels slightly dizzy now and headache 6/10 that just began.

## 2024-01-22 NOTE — Discharge Instructions (Signed)
 You were seen in the emergency ferment after a fall in the hospital There is no evidence of fracture of your hands The knees look okay and you were able to walk without issue You may be sore from a sprain or bruising after the fall Take Tylenol  as directed for pain Continue take all previous prescribed occasions Follow-up with your doctor within 1 week for reevaluation Return to the emerged department for repeated falls or severe pain

## 2024-01-22 NOTE — ED Provider Notes (Signed)
 [place] EMERGENCY DEPARTMENT AT [facility] Provider Note   CSN: [id number] Arrival date & time: 01/22/24  2032     Patient presents with: Stephanie Norris is a 68 y.o. female.  Who presents to the ED after a fall.  Patient was visiting her husband who is here in the hospital after surgery when she tripped and fell forward on a set of stairs here.  She landed on both hands and knees.  Pain in both hands and knees now.  A nurse who was nearby helped her up and was able to arrange for transport here to the ED for further evaluation.  No head trauma or loss of consciousness.  Now reporting pain to both hands and knees.  No anticoagulation or other injuries at this time    Fall       Prior to Admission medications  Medication Sig Start Date End Date Taking? Authorizing Provider  albuterol  (PROVENTIL ) (2.5 MG/3ML) 0.083% nebulizer solution Take 3 mLs (2.5 mg total) by nebulization every 6 (six) hours as needed for wheezing or shortness of breath. Patient not taking: Reported on 12/30/2023 05/10/23   Iva Marty Saltness, MD  albuterol  (VENTOLIN  HFA) [phone] Base) MCG/ACT inhaler Inhale 2 puffs into the lungs every 4 (four) hours as needed for wheezing or shortness of breath. 08/31/20   Cheryl Reusing, FNP  azelastine  (ASTELIN ) 0.1 % nasal spray INSTILL 1 SPRAY INTO EACH NOSTRIL TWICE DAILY AS NEEDED FOR RUNNY NOSE 07/02/23   Ambs, Arlean HERO, FNP  BD PEN NEEDLE NANO 2ND GEN 32G X 4 MM MISC once a day DX e11.65; Duration: 90 days 10/24/23   [provider]  Biotin 89999 MCG TABS 1 tablet Orally Once a day    [provider]  BREO ELLIPTA  200-25 MCG/ACT AEPB Inhale 1 puff into the lungs daily. 05/10/23   Iva Marty Saltness, MD  cetirizine  (ZYRTEC ) 10 MG tablet Take 1 tablet (10 mg total) by mouth daily. 11/07/22   Iva Marty Saltness, MD  Continuous Blood Gluc Sensor (FREESTYLE LIBRE SENSOR SYSTEM) MISC as needed. 02/12/17   [provider]  Continuous  Glucose Receiver (FREESTYLE LIBRE 3 READER) DEVI as directed; Duration: 28 days 11/28/23   [provider]  dapagliflozin propanediol (FARXIGA) 10 MG TABS tablet Take 10 mg by mouth daily.    [provider]  diclofenac  sodium (VOLTAREN ) 1 % GEL Apply 3 grams to three large joints up to three times daily as needed 02/21/17   Cheryl Waddell HERO, PA-C  DULoxetine  (CYMBALTA ) 60 MG capsule Take 120 mg by mouth daily.    [provider]  ezetimibe (ZETIA) 10 MG tablet Take 10 mg by mouth daily. 05/09/22   [provider]  fenofibrate  160 MG tablet Take 160 mg by mouth daily. 12/23/23   [provider]  fluticasone  (FLONASE ) 50 MCG/ACT nasal spray 1 spray in each nostril Nasally Once a day    [provider]  folic acid  (FOLVITE ) 1 MG tablet folic acid  1 mg tablet  Take 1 tablet every day by oral route.    [provider]  glipiZIDE (GLUCOTROL) 5 MG tablet Take 5 mg by mouth daily. Patient not taking: Reported on 12/30/2023 12/17/19   [provider]  HUMALOG MIX 75/25 KWIKPEN (75-25) 100 UNIT/ML KwikPen 10u Subcutaneous twice a day before meals; Duration: 30 days 12/06/23   [provider]  levothyroxine (SYNTHROID) 50 MCG tablet Take 50 mcg by mouth daily. Patient  not taking: Reported on 12/30/2023 04/19/22   [provider]  Melatonin 10 MG TABS     [provider]  metFORMIN (GLUCOPHAGE-XR) 500 MG 24 hr tablet Take 1,000 mg by mouth 2 (two) times daily.  01/04/16   [provider]  methocarbamol  (ROBAXIN ) 500 MG tablet Take 500 mg by mouth at bedtime. Patient not taking: Reported on 12/30/2023    [provider]  methotrexate  (RHEUMATREX) 2.5 MG tablet Take 5 tablets (12.5 mg total) by mouth once a week. 12/13/23   Cheryl Waddell HERO, PA-C  montelukast  (SINGULAIR ) 10 MG tablet Take 1 tablet (10 mg total) by mouth at bedtime. 05/10/23   Iva Marty Saltness, MD  Multiple Vitamins-Minerals  (LUTEIN-ZEAXANTHIN PO) Take by mouth daily.    [provider]  Multiple Vitamins-Minerals (MULTIVITAMIN PO) Take by mouth.    [provider]  Omega 3-Lutein-Zeaxanthin (ADVANCED EYE HEALTH) 250-2.5-0.5 MG CAPS Orally    [provider]  Omega-3 Fatty Acids (FISH OIL) 1000 MG CAPS 3 capsules Orally Once a day    [provider]  pantoprazole  (PROTONIX ) 40 MG tablet Take 1 tablet by mouth 2 (two) times daily.  07/14/14   [provider]  progesterone  (PROMETRIUM ) 100 MG capsule Take 100 mg by mouth at bedtime. Patient not taking: Reported on 12/30/2023 02/22/20   [provider]  valACYclovir (VALTREX) 1000 MG tablet as needed. 11/12/18   [provider]    Allergies: Nsaids, Bactrim [sulfamethoxazole -trimethoprim], Cephalexin , and Levofloxacin    Review of Systems  Updated Vital Signs BP (!) 162/81   Pulse 80   Temp 97.8 F (36.6 C) (Oral)   Resp 20   Ht 4' 11 (1.499 m)   Wt 50.8 kg   SpO2 100%   BMI 22.62 kg/m   Physical Exam Vitals and nursing note reviewed.  HENT:     Head: Normocephalic and atraumatic.  Eyes:     Pupils: Pupils are equal, round, and reactive to light.  Cardiovascular:     Rate and Rhythm: Normal rate and regular rhythm.  Pulmonary:     Effort: Pulmonary effort is normal.     Breath sounds: Normal breath sounds.  Abdominal:     Palpations: Abdomen is soft.     Tenderness: There is no abdominal tenderness.  Musculoskeletal:     Comments: Tenderness over thenar eminence of both hands with no deformity sensation tact light touch throughout the hands and upper extremities Some tenderness to bilateral anterior knees but no deformity and good range of motion with flexion at the knee and hip  Skin:    General: Skin is warm and dry.  Neurological:     Mental Status: She is alert.  Psychiatric:        Mood and Affect: Mood normal.     (all labs ordered are listed, but only abnormal results are  displayed) Labs Reviewed  CBG MONITORING, ED - Abnormal; Notable for the following components:      Result Value   Glucose-Capillary 156 (*)    All other components within normal limits    EKG: None  Radiology: DG Hand Complete Left Result Date: 01/22/2024 EXAM: 3 OR MORE VIEW(S) XRAY OF THE LEFT HAND 01/22/2024 09:30:00 PM COMPARISON: None available. CLINICAL HISTORY: fall onto both hands FINDINGS: BONES AND JOINTS: No acute fracture. No malalignment. Moderate first carpometacarpal joint osteoarthritis. Mild osteoarthritis of the third and fourth DIP joints. SOFT TISSUES: The soft tissues are unremarkable. IMPRESSION: 1. No acute fracture  or dislocation. Electronically signed by: Oneil Devonshire MD 01/22/2024 09:37 PM EST RP Workstation: [id number]   DG Hand Complete Right Result Date: 01/22/2024 EXAM: 3 OR MORE VIEW(S) XRAY OF THE HAND 01/22/2024 09:30:00 PM COMPARISON: None available. CLINICAL HISTORY: fall onto both hands FINDINGS: BONES AND JOINTS: Mild degenerative joint disease (DJD) of the first carpometacarpal (CMC) joint. Advanced degenerative joint disease (DJD) of the second distal interphalangeal (DIP) joint. SOFT TISSUES: The soft tissues are unremarkable. IMPRESSION: 1. No acute fracture or dislocation. 2. Mild DJD of the first [facility] joint and advanced DJD of the second DIP joint. Electronically signed by: Greig Pique MD 01/22/2024 09:36 PM EST RP Workstation: [id number]     Procedures   Medications Ordered in the ED  acetaminophen  (TYLENOL ) tablet 975 mg (975 mg Oral Given 01/22/24 2112)                                    Medical Decision Making 68 year old female presenting after mechanical fall in the hospital today while visiting her husband after surgery.  Tripped on the bottom step and fell forward on both hands and knees.  Knees look okay on exam but she does have some tenderness in both hands.  No radiographic signs of acute injury.  She feels well and is able to  ambulate with steady gait.  Will discharge with return precautions and instruction for PCP follow-up.  Amount and/or Complexity of Data Reviewed Radiology: ordered.  Risk OTC drugs.        Final diagnoses:  Contusion of hand, unspecified laterality, initial encounter  Contusion of knee, unspecified laterality, initial encounter  Fall, initial encounter    ED Discharge Orders     None          Pamella Ozell LABOR, DO 01/22/24 2223

## 2024-01-31 ENCOUNTER — Other Ambulatory Visit: Payer: Self-pay | Admitting: Allergy & Immunology

## 2024-02-20 ENCOUNTER — Other Ambulatory Visit: Payer: Self-pay

## 2024-02-20 DIAGNOSIS — I739 Peripheral vascular disease, unspecified: Secondary | ICD-10-CM

## 2024-02-21 ENCOUNTER — Other Ambulatory Visit: Payer: Self-pay | Admitting: Allergy & Immunology

## 2024-02-26 NOTE — Progress Notes (Unsigned)
 "  Office Visit Note  Patient: Stephanie Norris             Date of Birth: [birth year]           MRN: [id number]             PCP: Shona Norleen PEDLAR, MD Referring: Shona Norleen PEDLAR, MD Visit Date: 03/11/2024 Occupation: Data Unavailable  Subjective:  Great toe pain   History of Present Illness: Stephanie Norris is a 69 y.o. female with history of rheumatoid arthritis. Patient remains on  Methotrexate  5 tablets by mouth every 7 days and folic acid  1 mg 2 tablets daily   CBC and CMP updated today.  Discussed the importance of holding methotrexate  if she develops signs or symptoms of an infection and to resume once the infection has completely cleared.   Activities of Daily Living:  Patient reports morning stiffness for *** {minute/hour:19697}.   Patient {ACTIONS;DENIES/REPORTS:21021675::Denies} nocturnal pain.  Difficulty dressing/grooming: {ACTIONS;DENIES/REPORTS:21021675::Denies} Difficulty climbing stairs: {ACTIONS;DENIES/REPORTS:21021675::Denies} Difficulty getting out of chair: {ACTIONS;DENIES/REPORTS:21021675::Denies} Difficulty using hands for taps, buttons, cutlery, and/or writing: {ACTIONS;DENIES/REPORTS:21021675::Denies}  No Rheumatology ROS completed.   PMFS History:  Patient Active Problem List   Diagnosis Date Noted   Chronic rhinitis 05/07/2022   Sore in nostril 05/07/2022   Mucous cyst of digit of left hand 04/07/2020   Vertigo 08/20/2016   Eustachian tube dysfunction 04/17/2016   Abdominal pain 03/02/2016   Gastroenteritis 03/02/2016   Rheumatoid arthritis with rheumatoid factor of multiple sites without organ or systems involvement (HCC) 01/13/2016   HLA B27 (HLA B27 positive) 01/13/2016   Osteoarthritis of foot 01/13/2016   DJD (degenerative joint disease), cervical 01/13/2016   Spondylosis of lumbar region without myelopathy or radiculopathy 01/13/2016   Primary osteoarthritis of both hands 01/13/2016   Non-seasonal allergic rhinitis 08/13/2014   Stiffness of  joints, not elsewhere classified, multiple sites 08/26/2013   Flu-like symptoms 03/02/2013   Lactic acidosis 03/02/2013   Sinus tachycardia 03/02/2013   Elevated lactic acid level 03/02/2013   Influenza due to identified novel influenza A virus with other respiratory manifestations 03/02/2013   Hyperglycemia 03/02/2013   Fibromyalgia    OSA (obstructive sleep apnea) 06/22/2011   DOE (dyspnea on exertion) 05/30/2011   Not well controlled moderate persistent asthma 03/30/2011    Past Medical History:  Diagnosis Date   Asthma    COPD (chronic obstructive pulmonary disease) (HCC)    Diabetes mellitus without complication (HCC)    Type 2   Dizziness    Fibromyalgia    GERD (gastroesophageal reflux disease)    Headache    Hyperlipidemia    OSA (obstructive sleep apnea)    Osteoarthritis    PCOS (polycystic ovarian syndrome)    Raynaud disease    Rheumatoid arthritis (HCC)     Family History  Problem Relation Age of Onset   Asthma Mother    Rheum arthritis Mother    Allergic rhinitis Mother    Rheum arthritis Sister    Allergic rhinitis Sister    Heart failure Father    Heart disease Father    Rheum arthritis Sister    Allergic rhinitis Sister    Allergic rhinitis Brother    Allergies Other        Everyone   Heart disease Maternal Grandfather    Heart disease Maternal Grandmother    Heart disease Paternal Grandfather    Heart disease Paternal Grandmother    Past Surgical History:  Procedure Laterality Date   APPENDECTOMY  BACK SURGERY  2021   CHOLECYSTECTOMY     COLONOSCOPY W/ BIOPSIES  fall 2009   COMBINED HYSTERECTOMY VAGINAL / OOPHORECTOMY / A&P REPAIR  10/2002   endometriosis, cystocele, fibroids   FINGER SURGERY Left 05/09/2020   spurs removed from left index finger   KNEE SURGERY  1996   TOE SURGERY     VESICOVAGINAL FISTULA CLOSURE W/ TAH     Social History[1] Social History   Social History Narrative   Lives with husband   Retired    no  children   [employer] degree   16 oz caffeine daily     Immunization History  Administered Date(s) Administered   Influenza Whole 10/07/2010, 11/06/2011   Influenza,inj,Quad PF,6+ Mos 11/05/2012, 11/02/2015, 11/22/2016, 11/15/2017   Influenza-Unspecified 11/05/2013   Moderna Sars-Covid-2 Vaccination 04/13/2019, 05/11/2019   Pneumococcal Conjugate-13 11/06/2014   Pneumococcal Polysaccharide-23 11/02/2015     Objective: Vital Signs: There were no vitals taken for this visit.   Physical Exam Vitals and nursing note reviewed.  Constitutional:      Appearance: She is well-developed.  HENT:     Head: Normocephalic and atraumatic.  Eyes:     Conjunctiva/sclera: Conjunctivae normal.  Cardiovascular:     Rate and Rhythm: Normal rate and regular rhythm.     Heart sounds: Normal heart sounds.  Pulmonary:     Effort: Pulmonary effort is normal.     Breath sounds: Normal breath sounds.  Abdominal:     General: Bowel sounds are normal.     Palpations: Abdomen is soft.  Musculoskeletal:     Cervical back: Normal range of motion.  Lymphadenopathy:     Cervical: No cervical adenopathy.  Skin:    General: Skin is warm and dry.     Capillary Refill: Capillary refill takes less than 2 seconds.  Neurological:     Mental Status: She is alert and oriented to person, place, and time.  Psychiatric:        Behavior: Behavior normal.      Musculoskeletal Exam: ***  CDAI Exam: CDAI Score: -- Patient Global: --; Provider Global: -- Swollen: --; Tender: -- Joint Exam 03/11/2024   No joint exam has been documented for this visit   There is currently no information documented on the homunculus. Go to the Rheumatology activity and complete the homunculus joint exam.  Investigation: No additional findings.  Imaging: No results found.  Recent Labs: Lab Results  Component Value Date   WBC 12.2 (H) 05/13/2023   HGB 13.9 05/13/2023   PLT 406 (H) 05/13/2023   NA 139 12/30/2023   K 4.6  12/30/2023   CL 108 12/30/2023   CO2 21 12/30/2023   GLUCOSE 152 (H) 12/30/2023   BUN 29 (H) 12/30/2023   CREATININE 1.07 (H) 12/30/2023   BILITOT 0.3 05/13/2023   ALKPHOS 54 09/10/2016   AST 14 05/13/2023   ALT 22 05/13/2023   PROT 6.8 05/13/2023   ALBUMIN 4.0 09/10/2016   CALCIUM 9.7 12/30/2023   GFRAA 96 07/26/2020    Speciality Comments: No specialty comments available.  Procedures:  No procedures performed Allergies: Nsaids, Bactrim [sulfamethoxazole -trimethoprim], Cephalexin , and Levofloxacin   Assessment / Plan:     Visit Diagnoses: Rheumatoid arthritis with rheumatoid factor of multiple sites without organ or systems involvement (HCC)  High risk medication use  HLA B27 (HLA B27 positive)  Primary osteoarthritis of both hands  Trochanteric bursitis of left hip  Primary osteoarthritis of both feet  Chronic SI joint pain  DDD (  degenerative disc disease), cervical  Spondylosis of lumbar spine  Fibromyalgia  Trapezius muscle spasm  Other fatigue  Osteopenia of multiple sites  History of diabetes mellitus  History of depression  History of sleep apnea  History of asthma  Orders: No orders of the defined types were placed in this encounter.  No orders of the defined types were placed in this encounter.   Face-to-face time spent with patient was *** minutes. Greater than 50% of time was spent in counseling and coordination of care.  Follow-Up Instructions: No follow-ups on file.   Waddell CHRISTELLA Craze, PA-C  Note - This record has been created using Dragon software.  Chart creation errors have been sought, but may not always  have been located. Such creation errors do not reflect on  the standard of medical care.     [1]  Social History Tobacco Use   Smoking status: Never    Passive exposure: Never   Smokeless tobacco: Never  Vaping Use   Vaping status: Never Used  Substance Use Topics   Alcohol use: No   Drug use: No   "

## 2024-03-11 ENCOUNTER — Ambulatory Visit: Admitting: Physician Assistant

## 2024-03-11 DIAGNOSIS — G8929 Other chronic pain: Secondary | ICD-10-CM

## 2024-03-11 DIAGNOSIS — M797 Fibromyalgia: Secondary | ICD-10-CM

## 2024-03-11 DIAGNOSIS — M5030 Other cervical disc degeneration, unspecified cervical region: Secondary | ICD-10-CM

## 2024-03-11 DIAGNOSIS — Z8659 Personal history of other mental and behavioral disorders: Secondary | ICD-10-CM

## 2024-03-11 DIAGNOSIS — Z79899 Other long term (current) drug therapy: Secondary | ICD-10-CM

## 2024-03-11 DIAGNOSIS — Z8709 Personal history of other diseases of the respiratory system: Secondary | ICD-10-CM

## 2024-03-11 DIAGNOSIS — M8589 Other specified disorders of bone density and structure, multiple sites: Secondary | ICD-10-CM

## 2024-03-11 DIAGNOSIS — M7062 Trochanteric bursitis, left hip: Secondary | ICD-10-CM

## 2024-03-11 DIAGNOSIS — M19042 Primary osteoarthritis, left hand: Secondary | ICD-10-CM

## 2024-03-11 DIAGNOSIS — Z8639 Personal history of other endocrine, nutritional and metabolic disease: Secondary | ICD-10-CM

## 2024-03-11 DIAGNOSIS — Z1589 Genetic susceptibility to other disease: Secondary | ICD-10-CM

## 2024-03-11 DIAGNOSIS — R5383 Other fatigue: Secondary | ICD-10-CM

## 2024-03-11 DIAGNOSIS — M19071 Primary osteoarthritis, right ankle and foot: Secondary | ICD-10-CM

## 2024-03-11 DIAGNOSIS — M62838 Other muscle spasm: Secondary | ICD-10-CM

## 2024-03-11 DIAGNOSIS — M0579 Rheumatoid arthritis with rheumatoid factor of multiple sites without organ or systems involvement: Secondary | ICD-10-CM

## 2024-03-11 DIAGNOSIS — Z8669 Personal history of other diseases of the nervous system and sense organs: Secondary | ICD-10-CM

## 2024-03-11 DIAGNOSIS — M47816 Spondylosis without myelopathy or radiculopathy, lumbar region: Secondary | ICD-10-CM

## 2024-03-12 ENCOUNTER — Ambulatory Visit (HOSPITAL_COMMUNITY)

## 2024-03-12 ENCOUNTER — Encounter: Admitting: Vascular Surgery

## 2024-03-12 ENCOUNTER — Ambulatory Visit: Admitting: Vascular Surgery

## 2024-03-19 ENCOUNTER — Encounter: Admitting: Vascular Surgery

## 2024-04-01 ENCOUNTER — Ambulatory Visit: Admitting: Family Medicine

## 2024-04-14 ENCOUNTER — Encounter

## 2024-04-21 ENCOUNTER — Encounter: Admitting: Vascular Surgery

## 2024-05-05 ENCOUNTER — Ambulatory Visit: Admitting: Physician Assistant

## 2024-06-01 ENCOUNTER — Ambulatory Visit: Admitting: Physician Assistant
